# Patient Record
Sex: Female | Born: 1935 | Race: White | Hispanic: No | Marital: Married | Smoking: Former smoker
Health system: Southern US, Community
[De-identification: ages and names within clinical notes are randomized; demographics above are authoritative.]

## PROBLEM LIST (undated history)

## (undated) DIAGNOSIS — J84112 Idiopathic pulmonary fibrosis: Secondary | ICD-10-CM

## (undated) DIAGNOSIS — I4891 Unspecified atrial fibrillation: Secondary | ICD-10-CM

## (undated) DIAGNOSIS — G4733 Obstructive sleep apnea (adult) (pediatric): Secondary | ICD-10-CM

## (undated) DIAGNOSIS — E785 Hyperlipidemia, unspecified: Secondary | ICD-10-CM

## (undated) DIAGNOSIS — F419 Anxiety disorder, unspecified: Secondary | ICD-10-CM

## (undated) DIAGNOSIS — M1990 Unspecified osteoarthritis, unspecified site: Secondary | ICD-10-CM

## (undated) DIAGNOSIS — B0229 Other postherpetic nervous system involvement: Secondary | ICD-10-CM

## (undated) DIAGNOSIS — F329 Major depressive disorder, single episode, unspecified: Secondary | ICD-10-CM

## (undated) DIAGNOSIS — M4800 Spinal stenosis, site unspecified: Secondary | ICD-10-CM

## (undated) DIAGNOSIS — E079 Disorder of thyroid, unspecified: Secondary | ICD-10-CM

## (undated) DIAGNOSIS — F32A Depression, unspecified: Secondary | ICD-10-CM

## (undated) DIAGNOSIS — I509 Heart failure, unspecified: Secondary | ICD-10-CM

## (undated) DIAGNOSIS — IMO0002 Reserved for concepts with insufficient information to code with codable children: Secondary | ICD-10-CM

## (undated) DIAGNOSIS — F0390 Unspecified dementia without behavioral disturbance: Secondary | ICD-10-CM

## (undated) DIAGNOSIS — D649 Anemia, unspecified: Secondary | ICD-10-CM

## (undated) DIAGNOSIS — K589 Irritable bowel syndrome without diarrhea: Secondary | ICD-10-CM

## (undated) DIAGNOSIS — I10 Essential (primary) hypertension: Secondary | ICD-10-CM

## (undated) DIAGNOSIS — F1011 Alcohol abuse, in remission: Secondary | ICD-10-CM

## (undated) DIAGNOSIS — M353 Polymyalgia rheumatica: Secondary | ICD-10-CM

## (undated) DIAGNOSIS — K5730 Diverticulosis of large intestine without perforation or abscess without bleeding: Secondary | ICD-10-CM

## (undated) HISTORY — PX: KNEE ARTHROSCOPY: SUR90

## (undated) HISTORY — DX: Depression, unspecified: F32.A

## (undated) HISTORY — PX: DILATION AND CURETTAGE OF UTERUS: SHX78

## (undated) HISTORY — DX: Anxiety disorder, unspecified: F41.9

## (undated) HISTORY — PX: ROTATOR CUFF REPAIR: SHX139

## (undated) HISTORY — DX: Other postherpetic nervous system involvement: B02.29

## (undated) HISTORY — DX: Spinal stenosis, site unspecified: M48.00

## (undated) HISTORY — DX: Idiopathic pulmonary fibrosis: J84.112

## (undated) HISTORY — PX: CATARACT EXTRACTION: SUR2

## (undated) HISTORY — DX: Unspecified osteoarthritis, unspecified site: M19.90

## (undated) HISTORY — DX: Reserved for concepts with insufficient information to code with codable children: IMO0002

## (undated) HISTORY — DX: Essential (primary) hypertension: I10

## (undated) HISTORY — PX: VERTEBROPLASTY: SHX113

## (undated) HISTORY — DX: Irritable bowel syndrome, unspecified: K58.9

## (undated) HISTORY — DX: Anemia, unspecified: D64.9

## (undated) HISTORY — DX: Polymyalgia rheumatica: M35.3

## (undated) HISTORY — DX: Major depressive disorder, single episode, unspecified: F32.9

## (undated) HISTORY — PX: APPENDECTOMY: SHX54

## (undated) HISTORY — DX: Diverticulosis of large intestine without perforation or abscess without bleeding: K57.30

## (undated) HISTORY — DX: Disorder of thyroid, unspecified: E07.9

## (undated) HISTORY — DX: Hyperlipidemia, unspecified: E78.5

## (undated) HISTORY — DX: Alcohol abuse, in remission: F10.11

## (undated) HISTORY — PX: BREAST BIOPSY: SHX20

## (undated) HISTORY — DX: Obstructive sleep apnea (adult) (pediatric): G47.33

---

## 1997-06-19 ENCOUNTER — Ambulatory Visit (HOSPITAL_COMMUNITY): Admission: RE | Admit: 1997-06-19 | Discharge: 1997-06-19 | Payer: Self-pay | Admitting: Internal Medicine

## 1997-11-14 ENCOUNTER — Other Ambulatory Visit: Admission: RE | Admit: 1997-11-14 | Discharge: 1997-11-14 | Payer: Self-pay | Admitting: Obstetrics & Gynecology

## 1998-03-31 ENCOUNTER — Ambulatory Visit (HOSPITAL_COMMUNITY): Admission: RE | Admit: 1998-03-31 | Discharge: 1998-03-31 | Payer: Self-pay | Admitting: Ophthalmology

## 1998-05-20 ENCOUNTER — Other Ambulatory Visit: Admission: RE | Admit: 1998-05-20 | Discharge: 1998-05-20 | Payer: Self-pay | Admitting: Obstetrics & Gynecology

## 1998-08-04 ENCOUNTER — Ambulatory Visit (HOSPITAL_BASED_OUTPATIENT_CLINIC_OR_DEPARTMENT_OTHER): Admission: RE | Admit: 1998-08-04 | Discharge: 1998-08-04 | Payer: Self-pay | Admitting: Orthopedic Surgery

## 1999-04-29 ENCOUNTER — Encounter: Payer: Self-pay | Admitting: Surgery

## 1999-04-29 ENCOUNTER — Encounter: Admission: RE | Admit: 1999-04-29 | Discharge: 1999-04-29 | Payer: Self-pay | Admitting: Surgery

## 1999-04-30 ENCOUNTER — Ambulatory Visit (HOSPITAL_BASED_OUTPATIENT_CLINIC_OR_DEPARTMENT_OTHER): Admission: RE | Admit: 1999-04-30 | Discharge: 1999-04-30 | Payer: Self-pay | Admitting: Surgery

## 1999-04-30 ENCOUNTER — Encounter (INDEPENDENT_AMBULATORY_CARE_PROVIDER_SITE_OTHER): Payer: Self-pay | Admitting: *Deleted

## 1999-04-30 ENCOUNTER — Encounter: Payer: Self-pay | Admitting: Surgery

## 1999-05-13 ENCOUNTER — Encounter: Admission: RE | Admit: 1999-05-13 | Discharge: 1999-06-07 | Payer: Self-pay | Admitting: Internal Medicine

## 1999-06-10 ENCOUNTER — Other Ambulatory Visit: Admission: RE | Admit: 1999-06-10 | Discharge: 1999-06-10 | Payer: Self-pay | Admitting: Obstetrics & Gynecology

## 1999-09-06 ENCOUNTER — Ambulatory Visit (HOSPITAL_BASED_OUTPATIENT_CLINIC_OR_DEPARTMENT_OTHER): Admission: RE | Admit: 1999-09-06 | Discharge: 1999-09-07 | Payer: Self-pay | Admitting: Orthopedic Surgery

## 2000-01-31 ENCOUNTER — Encounter: Admission: RE | Admit: 2000-01-31 | Discharge: 2000-02-28 | Payer: Self-pay | Admitting: Internal Medicine

## 2000-05-18 ENCOUNTER — Ambulatory Visit (HOSPITAL_COMMUNITY): Admission: RE | Admit: 2000-05-18 | Discharge: 2000-05-18 | Payer: Self-pay | Admitting: Orthopedic Surgery

## 2000-05-18 ENCOUNTER — Encounter: Payer: Self-pay | Admitting: Orthopedic Surgery

## 2000-05-19 ENCOUNTER — Encounter: Payer: Self-pay | Admitting: Orthopedic Surgery

## 2000-05-19 ENCOUNTER — Ambulatory Visit (HOSPITAL_COMMUNITY): Admission: RE | Admit: 2000-05-19 | Discharge: 2000-05-19 | Payer: Self-pay | Admitting: Orthopedic Surgery

## 2000-07-11 ENCOUNTER — Encounter: Admission: RE | Admit: 2000-07-11 | Discharge: 2000-07-19 | Payer: Self-pay | Admitting: Family Medicine

## 2000-10-16 ENCOUNTER — Other Ambulatory Visit: Admission: RE | Admit: 2000-10-16 | Discharge: 2000-10-16 | Payer: Self-pay | Admitting: Obstetrics & Gynecology

## 2001-04-04 ENCOUNTER — Encounter: Payer: Self-pay | Admitting: Orthopedic Surgery

## 2001-04-04 ENCOUNTER — Encounter: Admission: RE | Admit: 2001-04-04 | Discharge: 2001-04-04 | Payer: Self-pay | Admitting: Orthopedic Surgery

## 2001-04-18 ENCOUNTER — Encounter: Payer: Self-pay | Admitting: Orthopedic Surgery

## 2001-04-18 ENCOUNTER — Encounter: Admission: RE | Admit: 2001-04-18 | Discharge: 2001-04-18 | Payer: Self-pay | Admitting: Orthopedic Surgery

## 2001-05-02 ENCOUNTER — Encounter: Admission: RE | Admit: 2001-05-02 | Discharge: 2001-05-02 | Payer: Self-pay | Admitting: Orthopedic Surgery

## 2001-05-02 ENCOUNTER — Encounter: Payer: Self-pay | Admitting: Orthopedic Surgery

## 2001-10-20 ENCOUNTER — Emergency Department (HOSPITAL_COMMUNITY): Admission: EM | Admit: 2001-10-20 | Discharge: 2001-10-21 | Payer: Self-pay | Admitting: Emergency Medicine

## 2001-10-21 ENCOUNTER — Encounter: Payer: Self-pay | Admitting: Emergency Medicine

## 2001-12-05 ENCOUNTER — Other Ambulatory Visit: Admission: RE | Admit: 2001-12-05 | Discharge: 2001-12-05 | Payer: Self-pay | Admitting: Obstetrics & Gynecology

## 2002-02-23 ENCOUNTER — Encounter: Payer: Self-pay | Admitting: *Deleted

## 2002-02-23 ENCOUNTER — Emergency Department (HOSPITAL_COMMUNITY): Admission: EM | Admit: 2002-02-23 | Discharge: 2002-02-23 | Payer: Self-pay | Admitting: *Deleted

## 2002-06-07 ENCOUNTER — Encounter: Payer: Self-pay | Admitting: Internal Medicine

## 2002-06-07 LAB — HM COLONOSCOPY

## 2002-06-11 ENCOUNTER — Encounter: Payer: Self-pay | Admitting: Family Medicine

## 2002-06-11 ENCOUNTER — Encounter: Admission: RE | Admit: 2002-06-11 | Discharge: 2002-06-11 | Payer: Self-pay | Admitting: Family Medicine

## 2002-06-11 ENCOUNTER — Encounter: Payer: Self-pay | Admitting: Radiology

## 2002-06-25 ENCOUNTER — Encounter: Admission: RE | Admit: 2002-06-25 | Discharge: 2002-06-25 | Payer: Self-pay | Admitting: Family Medicine

## 2002-06-25 ENCOUNTER — Encounter: Payer: Self-pay | Admitting: Family Medicine

## 2002-08-12 ENCOUNTER — Encounter: Admission: RE | Admit: 2002-08-12 | Discharge: 2002-08-12 | Payer: Self-pay | Admitting: Family Medicine

## 2002-08-12 ENCOUNTER — Encounter: Payer: Self-pay | Admitting: Family Medicine

## 2002-09-09 ENCOUNTER — Encounter (INDEPENDENT_AMBULATORY_CARE_PROVIDER_SITE_OTHER): Payer: Self-pay | Admitting: *Deleted

## 2002-09-09 ENCOUNTER — Inpatient Hospital Stay (HOSPITAL_COMMUNITY): Admission: RE | Admit: 2002-09-09 | Discharge: 2002-09-10 | Payer: Self-pay | Admitting: Family Medicine

## 2002-09-09 ENCOUNTER — Encounter: Payer: Self-pay | Admitting: Family Medicine

## 2003-01-07 ENCOUNTER — Other Ambulatory Visit: Admission: RE | Admit: 2003-01-07 | Discharge: 2003-01-07 | Payer: Self-pay | Admitting: Obstetrics & Gynecology

## 2003-12-03 ENCOUNTER — Ambulatory Visit: Payer: Self-pay | Admitting: Internal Medicine

## 2003-12-10 ENCOUNTER — Ambulatory Visit: Payer: Self-pay | Admitting: Internal Medicine

## 2004-02-04 ENCOUNTER — Ambulatory Visit: Payer: Self-pay | Admitting: Internal Medicine

## 2004-02-11 ENCOUNTER — Ambulatory Visit: Payer: Self-pay | Admitting: Internal Medicine

## 2004-02-25 ENCOUNTER — Ambulatory Visit: Payer: Self-pay | Admitting: Internal Medicine

## 2004-03-05 ENCOUNTER — Ambulatory Visit: Payer: Self-pay | Admitting: Internal Medicine

## 2004-04-14 ENCOUNTER — Ambulatory Visit: Payer: Self-pay | Admitting: Internal Medicine

## 2004-04-21 ENCOUNTER — Other Ambulatory Visit: Admission: RE | Admit: 2004-04-21 | Discharge: 2004-04-21 | Payer: Self-pay | Admitting: Obstetrics & Gynecology

## 2004-04-27 ENCOUNTER — Ambulatory Visit: Payer: Self-pay | Admitting: Internal Medicine

## 2004-04-29 ENCOUNTER — Encounter: Admission: RE | Admit: 2004-04-29 | Discharge: 2004-04-29 | Payer: Self-pay | Admitting: Internal Medicine

## 2004-05-05 ENCOUNTER — Ambulatory Visit: Payer: Self-pay | Admitting: Internal Medicine

## 2004-06-16 ENCOUNTER — Ambulatory Visit: Payer: Self-pay | Admitting: Internal Medicine

## 2004-06-23 ENCOUNTER — Ambulatory Visit: Payer: Self-pay | Admitting: Internal Medicine

## 2004-07-21 ENCOUNTER — Ambulatory Visit: Payer: Self-pay | Admitting: Internal Medicine

## 2004-08-18 ENCOUNTER — Ambulatory Visit: Payer: Self-pay | Admitting: Internal Medicine

## 2004-08-25 ENCOUNTER — Ambulatory Visit: Payer: Self-pay | Admitting: Internal Medicine

## 2004-09-07 ENCOUNTER — Ambulatory Visit: Payer: Self-pay | Admitting: Internal Medicine

## 2004-10-06 ENCOUNTER — Ambulatory Visit (HOSPITAL_BASED_OUTPATIENT_CLINIC_OR_DEPARTMENT_OTHER): Admission: RE | Admit: 2004-10-06 | Discharge: 2004-10-06 | Payer: Self-pay | Admitting: Internal Medicine

## 2004-10-14 ENCOUNTER — Ambulatory Visit: Payer: Self-pay | Admitting: Pulmonary Disease

## 2004-10-20 ENCOUNTER — Ambulatory Visit: Payer: Self-pay | Admitting: Internal Medicine

## 2004-11-24 ENCOUNTER — Ambulatory Visit: Payer: Self-pay | Admitting: Internal Medicine

## 2004-11-30 ENCOUNTER — Ambulatory Visit: Payer: Self-pay | Admitting: Pulmonary Disease

## 2004-12-20 ENCOUNTER — Ambulatory Visit: Payer: Self-pay | Admitting: Internal Medicine

## 2004-12-28 ENCOUNTER — Ambulatory Visit: Payer: Self-pay | Admitting: Pulmonary Disease

## 2005-01-05 ENCOUNTER — Ambulatory Visit: Payer: Self-pay | Admitting: Internal Medicine

## 2005-01-14 ENCOUNTER — Encounter: Admission: RE | Admit: 2005-01-14 | Discharge: 2005-01-14 | Payer: Self-pay | Admitting: Orthopedic Surgery

## 2005-01-19 ENCOUNTER — Ambulatory Visit (HOSPITAL_COMMUNITY): Admission: RE | Admit: 2005-01-19 | Discharge: 2005-01-19 | Payer: Self-pay | Admitting: Orthopedic Surgery

## 2005-01-19 ENCOUNTER — Ambulatory Visit (HOSPITAL_BASED_OUTPATIENT_CLINIC_OR_DEPARTMENT_OTHER): Admission: RE | Admit: 2005-01-19 | Discharge: 2005-01-19 | Payer: Self-pay | Admitting: Orthopedic Surgery

## 2005-01-19 ENCOUNTER — Encounter (INDEPENDENT_AMBULATORY_CARE_PROVIDER_SITE_OTHER): Payer: Self-pay | Admitting: *Deleted

## 2005-02-28 ENCOUNTER — Ambulatory Visit: Payer: Self-pay | Admitting: Internal Medicine

## 2005-04-01 ENCOUNTER — Ambulatory Visit: Payer: Self-pay | Admitting: Internal Medicine

## 2005-05-12 ENCOUNTER — Ambulatory Visit: Payer: Self-pay | Admitting: Internal Medicine

## 2005-06-06 ENCOUNTER — Ambulatory Visit: Payer: Self-pay | Admitting: Internal Medicine

## 2005-06-23 ENCOUNTER — Ambulatory Visit: Payer: Self-pay | Admitting: Internal Medicine

## 2005-07-18 ENCOUNTER — Ambulatory Visit: Payer: Self-pay | Admitting: Internal Medicine

## 2005-07-19 ENCOUNTER — Ambulatory Visit: Payer: Self-pay | Admitting: Internal Medicine

## 2005-07-21 ENCOUNTER — Ambulatory Visit: Payer: Self-pay | Admitting: Internal Medicine

## 2005-08-22 ENCOUNTER — Ambulatory Visit: Payer: Self-pay | Admitting: Internal Medicine

## 2005-09-01 ENCOUNTER — Ambulatory Visit: Payer: Self-pay | Admitting: Internal Medicine

## 2005-09-29 ENCOUNTER — Ambulatory Visit: Payer: Self-pay | Admitting: Internal Medicine

## 2005-11-03 ENCOUNTER — Ambulatory Visit: Payer: Self-pay | Admitting: Internal Medicine

## 2005-11-25 ENCOUNTER — Ambulatory Visit: Payer: Self-pay | Admitting: Internal Medicine

## 2005-11-25 ENCOUNTER — Encounter: Admission: RE | Admit: 2005-11-25 | Discharge: 2005-11-25 | Payer: Self-pay | Admitting: Internal Medicine

## 2005-12-01 ENCOUNTER — Encounter: Admission: RE | Admit: 2005-12-01 | Discharge: 2005-12-01 | Payer: Self-pay | Admitting: Internal Medicine

## 2005-12-05 ENCOUNTER — Ambulatory Visit: Payer: Self-pay | Admitting: Internal Medicine

## 2005-12-07 ENCOUNTER — Encounter: Admission: RE | Admit: 2005-12-07 | Discharge: 2005-12-26 | Payer: Self-pay | Admitting: Internal Medicine

## 2006-01-02 ENCOUNTER — Ambulatory Visit: Payer: Self-pay | Admitting: Internal Medicine

## 2006-01-11 ENCOUNTER — Encounter: Admission: RE | Admit: 2006-01-11 | Discharge: 2006-01-11 | Payer: Self-pay | Admitting: Internal Medicine

## 2006-01-25 ENCOUNTER — Encounter: Admission: RE | Admit: 2006-01-25 | Discharge: 2006-01-25 | Payer: Self-pay | Admitting: Internal Medicine

## 2006-02-08 ENCOUNTER — Ambulatory Visit: Payer: Self-pay | Admitting: Internal Medicine

## 2006-02-15 ENCOUNTER — Encounter: Admission: RE | Admit: 2006-02-15 | Discharge: 2006-02-15 | Payer: Self-pay | Admitting: Internal Medicine

## 2006-02-22 ENCOUNTER — Ambulatory Visit: Payer: Self-pay | Admitting: Internal Medicine

## 2006-02-23 ENCOUNTER — Ambulatory Visit: Payer: Self-pay | Admitting: Internal Medicine

## 2006-04-04 ENCOUNTER — Ambulatory Visit: Payer: Self-pay | Admitting: Internal Medicine

## 2006-04-26 ENCOUNTER — Ambulatory Visit: Payer: Self-pay | Admitting: Internal Medicine

## 2006-04-26 LAB — CONVERTED CEMR LAB
Anti Nuclear Antibody(ANA): NEGATIVE
Basophils Absolute: 0 10*3/uL (ref 0.0–0.1)
Basophils Relative: 0.6 % (ref 0.0–1.0)
Eosinophils Absolute: 0.1 10*3/uL (ref 0.0–0.6)
Eosinophils Relative: 1.3 % (ref 0.0–5.0)
HCT: 31.5 % — ABNORMAL LOW (ref 36.0–46.0)
Hemoglobin: 10.9 g/dL — ABNORMAL LOW (ref 12.0–15.0)
Lymphocytes Relative: 48.7 % — ABNORMAL HIGH (ref 12.0–46.0)
MCHC: 34.5 g/dL (ref 30.0–36.0)
MCV: 97.3 fL (ref 78.0–100.0)
Monocytes Absolute: 0.5 10*3/uL (ref 0.2–0.7)
Monocytes Relative: 10.5 % (ref 3.0–11.0)
Neutro Abs: 1.9 10*3/uL (ref 1.4–7.7)
Neutrophils Relative %: 38.9 % — ABNORMAL LOW (ref 43.0–77.0)
Platelets: 176 10*3/uL (ref 150–400)
RBC: 3.24 M/uL — ABNORMAL LOW (ref 3.87–5.11)
RDW: 12.9 % (ref 11.5–14.6)
Rhuematoid fact SerPl-aCnc: <20 intl units/mL — ABNORMAL LOW (ref 0.0–20.0)
WBC: 4.8 10*3/uL (ref 4.5–10.5)

## 2006-04-30 DIAGNOSIS — F329 Major depressive disorder, single episode, unspecified: Secondary | ICD-10-CM

## 2006-04-30 DIAGNOSIS — F32A Depression, unspecified: Secondary | ICD-10-CM | POA: Insufficient documentation

## 2006-04-30 DIAGNOSIS — M810 Age-related osteoporosis without current pathological fracture: Secondary | ICD-10-CM | POA: Insufficient documentation

## 2006-04-30 DIAGNOSIS — I10 Essential (primary) hypertension: Secondary | ICD-10-CM

## 2006-05-01 ENCOUNTER — Ambulatory Visit: Payer: Self-pay | Admitting: Internal Medicine

## 2006-05-15 ENCOUNTER — Ambulatory Visit: Payer: Self-pay | Admitting: Internal Medicine

## 2006-05-15 LAB — CONVERTED CEMR LAB
Basophils Absolute: 0 10*3/uL (ref 0.0–0.1)
Basophils Relative: 0.6 % (ref 0.0–1.0)
Eosinophils Absolute: 0 10*3/uL (ref 0.0–0.6)
Eosinophils Relative: 0.2 % (ref 0.0–5.0)
Ferritin: 44.2 ng/mL (ref 10.0–291.0)
Folate: 9.8 ng/mL
HCT: 40.1 % (ref 36.0–46.0)
Hemoglobin: 13.6 g/dL (ref 12.0–15.0)
Lymphocytes Relative: 24.1 % (ref 12.0–46.0)
MCHC: 33.9 g/dL (ref 30.0–36.0)
MCV: 99 fL (ref 78.0–100.0)
Monocytes Absolute: 0.2 10*3/uL (ref 0.2–0.7)
Monocytes Relative: 2.8 % — ABNORMAL LOW (ref 3.0–11.0)
Neutro Abs: 5 10*3/uL (ref 1.4–7.7)
Neutrophils Relative %: 72.3 % (ref 43.0–77.0)
Platelets: 223 10*3/uL (ref 150–400)
RBC: 4.05 M/uL (ref 3.87–5.11)
RDW: 13.6 % (ref 11.5–14.6)
Vitamin B-12: 354 pg/mL (ref 211–911)
WBC: 6.9 10*3/uL (ref 4.5–10.5)

## 2006-06-06 ENCOUNTER — Ambulatory Visit: Payer: Self-pay | Admitting: Internal Medicine

## 2006-06-06 DIAGNOSIS — K5730 Diverticulosis of large intestine without perforation or abscess without bleeding: Secondary | ICD-10-CM | POA: Insufficient documentation

## 2006-06-06 LAB — CONVERTED CEMR LAB
ALT: 31 U/L
AST: 26 U/L
Albumin: 3.6 g/dL
Alkaline Phosphatase: 40 U/L
Amylase: 63 U/L
BUN: 14 mg/dL
Basophils Absolute: 0.1 K/uL
Basophils Relative: 1 %
Bilirubin, Direct: 0.1 mg/dL
CO2: 32 meq/L
Calcium: 9.4 mg/dL
Chloride: 101 meq/L
Creatinine, Ser: 0.8 mg/dL
Eosinophils Absolute: 0 K/uL
Eosinophils Relative: 0.2 %
GFR calc Af Amer: 90 mL/min
GFR calc non Af Amer: 75 mL/min
Glucose, Bld: 119 mg/dL — ABNORMAL HIGH
HCT: 34.2 % — ABNORMAL LOW
Hemoglobin: 11.7 g/dL — ABNORMAL LOW
Lymphocytes Relative: 26 %
MCHC: 34.2 g/dL
MCV: 98.3 fL
Monocytes Absolute: 0.3 K/uL
Monocytes Relative: 5.7 %
Neutro Abs: 3.4 K/uL
Neutrophils Relative %: 67.1 %
Platelets: 198 K/uL
Potassium: 4.2 meq/L
RBC: 3.48 M/uL — ABNORMAL LOW
RDW: 13.5 %
Sodium: 139 meq/L
Total Bilirubin: 0.5 mg/dL
Total Protein: 7.2 g/dL
WBC: 5.1 10*3/microliter

## 2006-06-13 ENCOUNTER — Ambulatory Visit: Payer: Self-pay | Admitting: Internal Medicine

## 2006-06-23 ENCOUNTER — Ambulatory Visit: Payer: Self-pay | Admitting: Internal Medicine

## 2006-07-12 ENCOUNTER — Ambulatory Visit: Payer: Self-pay | Admitting: Internal Medicine

## 2006-07-25 ENCOUNTER — Ambulatory Visit: Payer: Self-pay | Admitting: Internal Medicine

## 2006-07-25 LAB — CONVERTED CEMR LAB
ALT: 20 units/L (ref 0–35)
AST: 27 units/L (ref 0–37)
Albumin: 3.1 g/dL — ABNORMAL LOW (ref 3.5–5.2)
Alkaline Phosphatase: 55 units/L (ref 39–117)
BUN: 8 mg/dL (ref 6–23)
Bilirubin, Direct: 0.1 mg/dL (ref 0.0–0.3)
CK-MB: 3.8 ng/mL (ref 0.3–4.0)
CO2: 33 meq/L — ABNORMAL HIGH (ref 19–32)
Calcium: 9 mg/dL (ref 8.4–10.5)
Chloride: 106 meq/L (ref 96–112)
Creatinine, Ser: 0.7 mg/dL (ref 0.4–1.2)
GFR calc Af Amer: 105 mL/min
GFR calc non Af Amer: 87 mL/min
Glucose, Bld: 94 mg/dL (ref 70–99)
Potassium: 3.5 meq/L (ref 3.5–5.1)
Sodium: 139 meq/L (ref 135–145)
Total Bilirubin: 0.7 mg/dL (ref 0.3–1.2)
Total CK: 91 units/L (ref 7–177)
Total Protein: 7.9 g/dL (ref 6.0–8.3)

## 2006-08-01 ENCOUNTER — Ambulatory Visit: Payer: Self-pay | Admitting: Internal Medicine

## 2006-08-24 ENCOUNTER — Encounter: Admission: RE | Admit: 2006-08-24 | Discharge: 2006-08-24 | Payer: Self-pay | Admitting: Neurosurgery

## 2006-08-25 ENCOUNTER — Telehealth (INDEPENDENT_AMBULATORY_CARE_PROVIDER_SITE_OTHER): Payer: Self-pay | Admitting: *Deleted

## 2006-09-07 ENCOUNTER — Encounter: Payer: Self-pay | Admitting: Internal Medicine

## 2006-09-12 ENCOUNTER — Ambulatory Visit: Payer: Self-pay | Admitting: Internal Medicine

## 2006-09-12 LAB — CONVERTED CEMR LAB: TSH: 3.06 microintl units/mL (ref 0.35–5.50)

## 2006-09-26 ENCOUNTER — Telehealth: Payer: Self-pay | Admitting: Internal Medicine

## 2006-10-17 ENCOUNTER — Ambulatory Visit: Payer: Self-pay | Admitting: Internal Medicine

## 2006-10-19 LAB — CONVERTED CEMR LAB
ALT: 25 units/L (ref 0–35)
AST: 29 units/L (ref 0–37)
Albumin: 3.7 g/dL (ref 3.5–5.2)
Alkaline Phosphatase: 46 units/L (ref 39–117)
BUN: 7 mg/dL (ref 6–23)
Bilirubin, Direct: 0.1 mg/dL (ref 0.0–0.3)
CO2: 31 meq/L (ref 19–32)
Calcium: 9.7 mg/dL (ref 8.4–10.5)
Chloride: 101 meq/L (ref 96–112)
Cholesterol: 252 mg/dL (ref 0–200)
Creatinine, Ser: 0.8 mg/dL (ref 0.4–1.2)
Direct LDL: 168.2 mg/dL
GFR calc Af Amer: 90 mL/min
GFR calc non Af Amer: 75 mL/min
Glucose, Bld: 99 mg/dL (ref 70–99)
HDL: 75.6 mg/dL (ref >39.0)
Potassium: 5 meq/L (ref 3.5–5.1)
Sodium: 138 meq/L (ref 135–145)
Total Bilirubin: 0.6 mg/dL (ref 0.3–1.2)
Total CHOL/HDL Ratio: 3.3
Total Protein: 7.7 g/dL (ref 6.0–8.3)
Triglycerides: 56 mg/dL (ref 0–149)
VLDL: 11 mg/dL (ref 0–40)

## 2006-10-31 ENCOUNTER — Telehealth: Payer: Self-pay | Admitting: Internal Medicine

## 2006-11-01 ENCOUNTER — Encounter: Payer: Self-pay | Admitting: Internal Medicine

## 2006-11-09 ENCOUNTER — Ambulatory Visit: Payer: Self-pay | Admitting: Internal Medicine

## 2006-11-16 ENCOUNTER — Ambulatory Visit: Payer: Self-pay | Admitting: Pulmonary Disease

## 2006-11-16 ENCOUNTER — Ambulatory Visit: Payer: Self-pay | Admitting: Internal Medicine

## 2006-11-16 DIAGNOSIS — G894 Chronic pain syndrome: Secondary | ICD-10-CM

## 2006-12-08 ENCOUNTER — Ambulatory Visit: Payer: Self-pay | Admitting: Internal Medicine

## 2006-12-19 ENCOUNTER — Ambulatory Visit: Payer: Self-pay | Admitting: Internal Medicine

## 2007-01-23 ENCOUNTER — Telehealth: Payer: Self-pay | Admitting: Internal Medicine

## 2007-01-30 ENCOUNTER — Ambulatory Visit: Payer: Self-pay | Admitting: Internal Medicine

## 2007-02-16 ENCOUNTER — Telehealth: Payer: Self-pay | Admitting: Internal Medicine

## 2007-03-01 ENCOUNTER — Ambulatory Visit: Payer: Self-pay | Admitting: Internal Medicine

## 2007-03-19 ENCOUNTER — Telehealth: Payer: Self-pay | Admitting: Internal Medicine

## 2007-03-29 ENCOUNTER — Ambulatory Visit: Payer: Self-pay | Admitting: Internal Medicine

## 2007-05-10 ENCOUNTER — Ambulatory Visit: Payer: Self-pay | Admitting: Internal Medicine

## 2007-05-10 LAB — CONVERTED CEMR LAB
BUN: 8 mg/dL (ref 6–23)
GFR calc Af Amer: 125 mL/min
GFR calc non Af Amer: 104 mL/min
Potassium: 3.6 meq/L (ref 3.5–5.1)
Sodium: 136 meq/L (ref 135–145)

## 2007-06-01 ENCOUNTER — Ambulatory Visit: Payer: Self-pay | Admitting: Internal Medicine

## 2007-06-07 ENCOUNTER — Ambulatory Visit: Payer: Self-pay | Admitting: Internal Medicine

## 2007-06-13 ENCOUNTER — Ambulatory Visit: Payer: Self-pay | Admitting: Internal Medicine

## 2007-06-25 ENCOUNTER — Telehealth: Payer: Self-pay | Admitting: Internal Medicine

## 2007-07-13 ENCOUNTER — Ambulatory Visit: Payer: Self-pay | Admitting: Internal Medicine

## 2007-08-03 ENCOUNTER — Telehealth: Payer: Self-pay | Admitting: Internal Medicine

## 2007-08-06 ENCOUNTER — Ambulatory Visit: Payer: Self-pay | Admitting: Internal Medicine

## 2007-08-07 LAB — CONVERTED CEMR LAB
ALT: 24 units/L (ref 0–35)
AST: 26 units/L (ref 0–37)
Alkaline Phosphatase: 51 units/L (ref 39–117)
Basophils Absolute: 0 10*3/uL (ref 0.0–0.1)
Basophils Relative: 0.8 % (ref 0.0–1.0)
Bilirubin, Direct: 0.1 mg/dL (ref 0.0–0.3)
CO2: 33 meq/L — ABNORMAL HIGH (ref 19–32)
Chloride: 101 meq/L (ref 96–112)
Creatinine, Ser: 0.7 mg/dL (ref 0.4–1.2)
Lymphocytes Relative: 56.9 % — ABNORMAL HIGH (ref 12.0–46.0)
MCHC: 33.8 g/dL (ref 30.0–36.0)
Neutrophils Relative %: 28.3 % — ABNORMAL LOW (ref 43.0–77.0)
Potassium: 4.3 meq/L (ref 3.5–5.1)
RBC: 3.79 M/uL — ABNORMAL LOW (ref 3.87–5.11)
Sodium: 139 meq/L (ref 135–145)
Total Bilirubin: 0.6 mg/dL (ref 0.3–1.2)

## 2007-08-10 ENCOUNTER — Telehealth: Payer: Self-pay | Admitting: Internal Medicine

## 2007-09-06 ENCOUNTER — Encounter: Payer: Self-pay | Admitting: Internal Medicine

## 2007-09-07 ENCOUNTER — Ambulatory Visit: Payer: Self-pay

## 2007-09-14 ENCOUNTER — Encounter: Payer: Self-pay | Admitting: Internal Medicine

## 2007-09-19 ENCOUNTER — Ambulatory Visit: Payer: Self-pay | Admitting: Internal Medicine

## 2007-10-02 ENCOUNTER — Telehealth: Payer: Self-pay | Admitting: Internal Medicine

## 2007-10-08 DIAGNOSIS — G4733 Obstructive sleep apnea (adult) (pediatric): Secondary | ICD-10-CM

## 2007-10-10 ENCOUNTER — Ambulatory Visit: Payer: Self-pay | Admitting: Internal Medicine

## 2007-10-29 ENCOUNTER — Encounter: Payer: Self-pay | Admitting: Internal Medicine

## 2007-10-31 ENCOUNTER — Ambulatory Visit: Payer: Self-pay | Admitting: Internal Medicine

## 2007-11-09 ENCOUNTER — Encounter: Admission: RE | Admit: 2007-11-09 | Discharge: 2007-11-09 | Payer: Self-pay | Admitting: Orthopedic Surgery

## 2007-11-12 ENCOUNTER — Ambulatory Visit (HOSPITAL_BASED_OUTPATIENT_CLINIC_OR_DEPARTMENT_OTHER): Admission: RE | Admit: 2007-11-12 | Discharge: 2007-11-12 | Payer: Self-pay | Admitting: Orthopedic Surgery

## 2007-11-20 ENCOUNTER — Telehealth: Payer: Self-pay | Admitting: Internal Medicine

## 2007-11-26 ENCOUNTER — Telehealth: Payer: Self-pay | Admitting: Internal Medicine

## 2007-11-28 ENCOUNTER — Ambulatory Visit: Payer: Self-pay | Admitting: Internal Medicine

## 2007-12-11 ENCOUNTER — Telehealth: Payer: Self-pay | Admitting: Internal Medicine

## 2007-12-11 ENCOUNTER — Encounter: Payer: Self-pay | Admitting: Internal Medicine

## 2007-12-18 ENCOUNTER — Telehealth: Payer: Self-pay | Admitting: Internal Medicine

## 2007-12-26 ENCOUNTER — Encounter: Payer: Self-pay | Admitting: Internal Medicine

## 2007-12-28 ENCOUNTER — Encounter: Payer: Self-pay | Admitting: Internal Medicine

## 2008-01-03 ENCOUNTER — Encounter: Payer: Self-pay | Admitting: Internal Medicine

## 2008-01-04 ENCOUNTER — Ambulatory Visit: Payer: Self-pay | Admitting: Internal Medicine

## 2008-01-07 ENCOUNTER — Encounter: Payer: Self-pay | Admitting: Internal Medicine

## 2008-01-08 LAB — CONVERTED CEMR LAB
Albumin: 3.9 g/dL (ref 3.5–5.2)
Alkaline Phosphatase: 58 units/L (ref 39–117)
BUN: 16 mg/dL (ref 6–23)
Bilirubin, Direct: 0.1 mg/dL (ref 0.0–0.3)
Calcium: 9.7 mg/dL (ref 8.4–10.5)
Eosinophils Absolute: 0.3 10*3/uL (ref 0.0–0.7)
GFR calc Af Amer: 105 mL/min
GFR calc non Af Amer: 87 mL/min
HCT: 39.4 % (ref 36.0–46.0)
MCHC: 33.9 g/dL (ref 30.0–36.0)
MCV: 96.9 fL (ref 78.0–100.0)
Monocytes Absolute: 0.8 10*3/uL (ref 0.1–1.0)
Neutro Abs: 4.6 10*3/uL (ref 1.4–7.7)
Platelets: 201 10*3/uL (ref 150–400)
Potassium: 5.1 meq/L (ref 3.5–5.1)
RDW: 14.4 % (ref 11.5–14.6)
Sodium: 137 meq/L (ref 135–145)

## 2008-01-22 ENCOUNTER — Telehealth: Payer: Self-pay | Admitting: Internal Medicine

## 2008-01-23 ENCOUNTER — Encounter: Payer: Self-pay | Admitting: Internal Medicine

## 2008-01-23 ENCOUNTER — Encounter: Admission: RE | Admit: 2008-01-23 | Discharge: 2008-01-23 | Payer: Self-pay | Admitting: Orthopedic Surgery

## 2008-01-24 ENCOUNTER — Ambulatory Visit: Payer: Self-pay | Admitting: Internal Medicine

## 2008-01-29 ENCOUNTER — Ambulatory Visit: Payer: Self-pay | Admitting: Internal Medicine

## 2008-01-31 ENCOUNTER — Encounter: Payer: Self-pay | Admitting: Internal Medicine

## 2008-02-06 ENCOUNTER — Encounter: Admission: RE | Admit: 2008-02-06 | Discharge: 2008-02-06 | Payer: Self-pay | Admitting: Neurosurgery

## 2008-02-22 ENCOUNTER — Encounter: Admission: RE | Admit: 2008-02-22 | Discharge: 2008-02-22 | Payer: Self-pay | Admitting: Neurosurgery

## 2008-02-25 ENCOUNTER — Telehealth: Payer: Self-pay | Admitting: Internal Medicine

## 2008-02-26 ENCOUNTER — Telehealth: Payer: Self-pay | Admitting: Internal Medicine

## 2008-03-07 ENCOUNTER — Ambulatory Visit: Payer: Self-pay | Admitting: Internal Medicine

## 2008-03-11 ENCOUNTER — Telehealth: Payer: Self-pay | Admitting: Internal Medicine

## 2008-03-19 ENCOUNTER — Encounter: Payer: Self-pay | Admitting: Internal Medicine

## 2008-03-19 ENCOUNTER — Ambulatory Visit: Payer: Self-pay | Admitting: Internal Medicine

## 2008-04-04 ENCOUNTER — Ambulatory Visit: Payer: Self-pay | Admitting: Internal Medicine

## 2008-05-20 ENCOUNTER — Ambulatory Visit: Payer: Self-pay | Admitting: Internal Medicine

## 2008-06-11 ENCOUNTER — Ambulatory Visit: Payer: Self-pay | Admitting: Internal Medicine

## 2008-06-12 ENCOUNTER — Telehealth (INDEPENDENT_AMBULATORY_CARE_PROVIDER_SITE_OTHER): Payer: Self-pay | Admitting: *Deleted

## 2008-06-13 ENCOUNTER — Encounter: Payer: Self-pay | Admitting: Internal Medicine

## 2008-06-13 ENCOUNTER — Telehealth: Payer: Self-pay | Admitting: Internal Medicine

## 2008-07-08 ENCOUNTER — Encounter: Admission: RE | Admit: 2008-07-08 | Discharge: 2008-07-08 | Payer: Self-pay | Admitting: Orthopedic Surgery

## 2008-07-09 ENCOUNTER — Ambulatory Visit: Payer: Self-pay | Admitting: Internal Medicine

## 2008-08-20 ENCOUNTER — Ambulatory Visit: Payer: Self-pay | Admitting: Internal Medicine

## 2008-09-12 ENCOUNTER — Telehealth: Payer: Self-pay | Admitting: Internal Medicine

## 2008-09-15 ENCOUNTER — Telehealth: Payer: Self-pay | Admitting: Internal Medicine

## 2008-09-18 ENCOUNTER — Telehealth: Payer: Self-pay | Admitting: Internal Medicine

## 2008-09-18 ENCOUNTER — Encounter: Admission: RE | Admit: 2008-09-18 | Discharge: 2008-09-18 | Payer: Self-pay | Admitting: Orthopedic Surgery

## 2008-09-25 ENCOUNTER — Ambulatory Visit: Payer: Self-pay | Admitting: Internal Medicine

## 2008-10-01 ENCOUNTER — Telehealth: Payer: Self-pay | Admitting: Internal Medicine

## 2008-11-03 ENCOUNTER — Telehealth: Payer: Self-pay | Admitting: Internal Medicine

## 2008-11-05 ENCOUNTER — Ambulatory Visit: Payer: Self-pay | Admitting: Internal Medicine

## 2008-11-14 ENCOUNTER — Telehealth: Payer: Self-pay | Admitting: Internal Medicine

## 2008-11-17 ENCOUNTER — Telehealth: Payer: Self-pay | Admitting: Internal Medicine

## 2008-12-09 ENCOUNTER — Telehealth: Payer: Self-pay | Admitting: Internal Medicine

## 2008-12-23 ENCOUNTER — Ambulatory Visit: Payer: Self-pay | Admitting: Internal Medicine

## 2008-12-26 ENCOUNTER — Encounter (INDEPENDENT_AMBULATORY_CARE_PROVIDER_SITE_OTHER): Payer: Self-pay | Admitting: *Deleted

## 2008-12-26 LAB — CONVERTED CEMR LAB: Vit D, 25-Hydroxy: 12 ng/mL — ABNORMAL LOW (ref 30–89)

## 2008-12-29 ENCOUNTER — Telehealth: Payer: Self-pay | Admitting: Internal Medicine

## 2008-12-29 LAB — CONVERTED CEMR LAB
Albumin: 3.7 g/dL (ref 3.5–5.2)
CO2: 31 meq/L (ref 19–32)
Calcium: 9.9 mg/dL (ref 8.4–10.5)
Chloride: 103 meq/L (ref 96–112)
Cholesterol: 234 mg/dL — ABNORMAL HIGH (ref 0–200)
Creatinine, Ser: 0.6 mg/dL (ref 0.4–1.2)
Direct LDL: 172.2 mg/dL
Sodium: 139 meq/L (ref 135–145)
Total CHOL/HDL Ratio: 5
Total Protein: 7.5 g/dL (ref 6.0–8.3)
Triglycerides: 100 mg/dL (ref 0.0–149.0)
VLDL: 20 mg/dL (ref 0.0–40.0)

## 2008-12-31 ENCOUNTER — Telehealth: Payer: Self-pay | Admitting: Internal Medicine

## 2009-01-01 ENCOUNTER — Telehealth: Payer: Self-pay | Admitting: Internal Medicine

## 2009-01-02 ENCOUNTER — Telehealth: Payer: Self-pay | Admitting: Internal Medicine

## 2009-01-20 ENCOUNTER — Telehealth: Payer: Self-pay | Admitting: Internal Medicine

## 2009-01-20 ENCOUNTER — Encounter: Payer: Self-pay | Admitting: Internal Medicine

## 2009-01-22 ENCOUNTER — Encounter: Payer: Self-pay | Admitting: Internal Medicine

## 2009-01-30 ENCOUNTER — Ambulatory Visit: Payer: Self-pay | Admitting: Internal Medicine

## 2009-02-03 LAB — CONVERTED CEMR LAB
CO2: 29 meq/L (ref 19–32)
Calcium: 9.6 mg/dL (ref 8.4–10.5)
Chloride: 102 meq/L (ref 96–112)
Creatinine, Ser: 0.6 mg/dL (ref 0.4–1.2)
Sodium: 138 meq/L (ref 135–145)

## 2009-02-04 ENCOUNTER — Telehealth: Payer: Self-pay | Admitting: Internal Medicine

## 2009-02-05 ENCOUNTER — Telehealth: Payer: Self-pay | Admitting: Internal Medicine

## 2009-02-18 ENCOUNTER — Ambulatory Visit: Payer: Self-pay | Admitting: Internal Medicine

## 2009-02-20 ENCOUNTER — Encounter: Payer: Self-pay | Admitting: Internal Medicine

## 2009-03-20 ENCOUNTER — Encounter: Payer: Self-pay | Admitting: Gastroenterology

## 2009-03-23 ENCOUNTER — Telehealth: Payer: Self-pay | Admitting: Internal Medicine

## 2009-03-23 ENCOUNTER — Encounter: Payer: Self-pay | Admitting: Internal Medicine

## 2009-04-21 ENCOUNTER — Ambulatory Visit: Payer: Self-pay | Admitting: Internal Medicine

## 2009-04-22 LAB — CONVERTED CEMR LAB
ALT: 20 units/L (ref 0–35)
AST: 26 units/L (ref 0–37)
Albumin: 3.7 g/dL (ref 3.5–5.2)
Calcium: 9.3 mg/dL (ref 8.4–10.5)
Cholesterol: 202 mg/dL — ABNORMAL HIGH (ref 0–200)
Direct LDL: 135.8 mg/dL
GFR calc non Af Amer: 103.04 mL/min (ref >60)
HDL: 59 mg/dL (ref >39.00)
Potassium: 4.9 meq/L (ref 3.5–5.1)
Sodium: 141 meq/L (ref 135–145)
Total Bilirubin: 0.4 mg/dL (ref 0.3–1.2)
Triglycerides: 109 mg/dL (ref 0.0–149.0)
Vit D, 25-Hydroxy: 13 ng/mL — ABNORMAL LOW (ref 30–89)

## 2009-04-30 ENCOUNTER — Telehealth: Payer: Self-pay | Admitting: Internal Medicine

## 2009-06-23 ENCOUNTER — Ambulatory Visit: Payer: Self-pay | Admitting: Internal Medicine

## 2009-07-10 ENCOUNTER — Encounter (INDEPENDENT_AMBULATORY_CARE_PROVIDER_SITE_OTHER): Payer: Self-pay | Admitting: *Deleted

## 2009-07-23 ENCOUNTER — Ambulatory Visit: Payer: Self-pay | Admitting: Internal Medicine

## 2009-07-28 LAB — CONVERTED CEMR LAB: Vit D, 25-Hydroxy: 21 ng/mL — ABNORMAL LOW (ref 30–89)

## 2009-08-13 ENCOUNTER — Ambulatory Visit: Payer: Self-pay | Admitting: Internal Medicine

## 2009-08-19 ENCOUNTER — Ambulatory Visit: Payer: Self-pay | Admitting: Internal Medicine

## 2009-09-29 ENCOUNTER — Encounter: Payer: Self-pay | Admitting: Internal Medicine

## 2009-10-05 ENCOUNTER — Ambulatory Visit: Payer: Self-pay | Admitting: Internal Medicine

## 2009-10-05 DIAGNOSIS — E559 Vitamin D deficiency, unspecified: Secondary | ICD-10-CM | POA: Insufficient documentation

## 2009-10-07 LAB — CONVERTED CEMR LAB: Vit D, 25-Hydroxy: 18 ng/mL — ABNORMAL LOW (ref 30–89)

## 2009-10-14 ENCOUNTER — Encounter: Payer: Self-pay | Admitting: Internal Medicine

## 2009-10-22 ENCOUNTER — Encounter: Payer: Self-pay | Admitting: Internal Medicine

## 2009-11-19 ENCOUNTER — Encounter: Admission: RE | Admit: 2009-11-19 | Discharge: 2009-11-19 | Payer: Self-pay | Admitting: Orthopedic Surgery

## 2009-11-23 ENCOUNTER — Ambulatory Visit (HOSPITAL_BASED_OUTPATIENT_CLINIC_OR_DEPARTMENT_OTHER): Admission: RE | Admit: 2009-11-23 | Discharge: 2009-11-23 | Payer: Self-pay | Admitting: Orthopedic Surgery

## 2009-11-30 ENCOUNTER — Ambulatory Visit: Payer: Self-pay | Admitting: Internal Medicine

## 2009-11-30 DIAGNOSIS — M25569 Pain in unspecified knee: Secondary | ICD-10-CM | POA: Insufficient documentation

## 2009-12-01 ENCOUNTER — Encounter: Payer: Self-pay | Admitting: Internal Medicine

## 2010-02-14 ENCOUNTER — Encounter: Payer: Self-pay | Admitting: Neurosurgery

## 2010-02-14 ENCOUNTER — Encounter: Payer: Self-pay | Admitting: Internal Medicine

## 2010-02-17 ENCOUNTER — Telehealth: Payer: Self-pay | Admitting: Internal Medicine

## 2010-02-18 ENCOUNTER — Encounter
Admission: RE | Admit: 2010-02-18 | Discharge: 2010-02-18 | Payer: Self-pay | Source: Home / Self Care | Attending: Internal Medicine | Admitting: Internal Medicine

## 2010-02-18 ENCOUNTER — Encounter: Payer: Self-pay | Admitting: Internal Medicine

## 2010-02-23 ENCOUNTER — Telehealth: Payer: Self-pay | Admitting: Internal Medicine

## 2010-02-25 NOTE — Miscellaneous (Signed)
Summary: med dose change  Medications Added TRAMADOL HCL 50 MG TABS (TRAMADOL HCL) 1-2 every six hours as needed       Clinical Lists Changes  Medications: Changed medication from TRAMADOL HCL 50 MG TABS (TRAMADOL HCL) as needed to TRAMADOL HCL 50 MG TABS (TRAMADOL HCL) 1-2 every six hours as needed

## 2010-02-25 NOTE — Letter (Signed)
Summary: [facility]  [facility]   Imported By: Maryln Gottron 10/28/2009 15:11:20  _____________________________________________________________________  External Attachment:    Type:   Image     Comment:   External Document

## 2010-02-25 NOTE — Assessment & Plan Note (Signed)
Summary: follow up on Vit D/per Dr. Margaret Pyle   Vital Signs:  Patient profile:   77 year old female Weight:      155 pounds Pulse rate:   72 / minute Pulse rhythm:   regular Resp:     12 per minute BP sitting:   138 / 70  (left arm)  Vitals Entered By: Gladis Riffle, RN (April 21, 2009 9:44 AM) CC: FU lab work, completed Vit D 50000 Units a couple weeks ago Is Patient Diabetic? No   Primary Care Provider:  Birdie Sons MD  CC:  FU lab work and completed Vit D 50000 Units a couple weeks ago.  History of Present Illness:  Follow-Up Visit      This is a 77 year old woman who presents for Follow-up visit.  The patient denies chest pain and palpitations.  Since the last visit the patient notes being seen by a specialist.  The patient reports taking meds as prescribed.  When questioned about possible medication side effects, the patient notes none.  Seeing dr. Merlyn Lot for hand pain (HE IS PRESCRIBING TRAMADOL).  All other systems reviewed and were negative   Preventive Screening-Counseling & Management  Alcohol-Tobacco     Smoking Status: quit > 6 months     Year Quit: 1990  Current Problems (verified): 1)  Spinal Stenosis, Lumbar By Report ?  (ICD-724.02) 2)  Sleep Apnea, Obstructive  (ICD-327.23) 3)  Syndrome, Chronic Pain  (ICD-338.4) 4)  Diverticulosis, Colon  (ICD-562.10) 5)  Postherpetic Neuralgia  (ICD-053.19) 6)  Polymyalgia Rheumatica  (ICD-725) 7)  Osteoporosis  (ICD-733.00) 8)  Hypertension  (ICD-401.9) 9)  Hyperlipidemia With Elevated Hdl  (ICD-272.4) 10)  Depression  (ICD-311)  Current Medications (verified): 1)  Aspirin Ec 81 Mg Tbec (Aspirin) .... Take 1 Tablet By Mouth Once A Day 2)  Gabapentin 300 Mg Caps (Gabapentin) .... 3 By Mouth At Bedtime 3)  Paroxetine Hcl 40 Mg Tabs (Paroxetine Hcl) .... Take 1 Tablet By Mouth Once A Day 4)  Vitamin D 1000 Unit  Tabs (Cholecalciferol) .... Once Daily 5)  Lomotil 2.5-0.025 Mg Tabs (Diphenoxylate-Atropine) .Marland Kitchen.. 1 By  Mouth Once Daily As Needed. 6)  Tylenol 325 Mg Tabs (Acetaminophen) .... Take One or Two As Needed 7)  Dicyclomine Hcl 20 Mg Tabs (Dicyclomine Hcl) .... Take One By Mouth Twice Daily. 8)  Cholestyramine 4 Gm Pack (Cholestyramine) .... Mix One Packet in An 8oz Glass of Juice or Water Daily. 9)  Tramadol Hcl 50 Mg Tabs (Tramadol Hcl) .... As Needed  Allergies: 1)  ! Clindamycin Hcl (Clindamycin Hcl) 2)  Zoloft (Sertraline Hcl) 3)  Celebrex (Celecoxib) 4)  * Vioxx  Past History:  Past Medical History: Last updated: 06/06/2006 vertebral fracture Depression Hyperlipidemia Hypertension Osteoporosis OSA PMR postherpetic neuralgia Diverticulosis, colon previous alcohol overuse  Past Surgical History: Last updated: 01/24/2008 rotator cuff surgery breast BX vertebroplasty x3  Appendectomy D & C -X several Cataract removal left knee arthroscopy--10/2007  Family History: Last updated: 09/25/2008 father- stroke 80, hx of MI mother- alcohol, peritonitis--77 Family History of CAD Female 1st degree relative <50--father in 47's Family History of Breast Cancer: Sister Family History of Heart Disease: Sister   No FH of Colon Cancer:  Social History: Last updated: 01/24/2008 Married Never Smoked Alcohol use-no- used to drink too much    Risk Factors: Smoking Status: quit > 6 months (04/21/2009)  Review of Systems       All other systems reviewed and were negative  hand pain  is much better GI complaints are well controlled---sees dr. Juanda Chance  Physical Exam  General:  alert and well-developed.   Head:  normocephalic and atraumatic.   Eyes:  pupils equal and pupils round.   Neck:  supple and full ROM.   Lungs:  normal respiratory effort and no intercostal retractions.   Heart:  normal rate and regular rhythm.   Abdomen:  Bowel sounds positive,abdomen soft and non-tender without masses, organomegaly or hernias noted. Msk:  No deformity or scoliosis noted of thoracic or  lumbar spine.   Neurologic:  cranial nerves II-XII intact and gait normal.   Skin:  turgor normal and color normal.   Psych:  normally interactive and good eye contact.     Impression & Recommendations:  Problem # 1:  HYPERLIPIDEMIA WITH ELEVATED HDL (ICD-272.4) check labs   Her updated medication list for this problem includes:    Cholestyramine 4 Gm Pack (Cholestyramine) ..... Mix one packet in an 8oz glass of juice or water daily.  Problem # 2:  POLYMYALGIA RHEUMATICA (ICD-725) sxs resolved no steroids  Problem # 3:  SPINAL STENOSIS, LUMBAR BY REPORT ? (ICD-724.02) chronic pain but doing well  Problem # 4:  HYPERTENSION (ICD-401.9)  reasonable control check labs BP today: 138/70 Prior BP: 114/62 (02/18/2009)  Labs Reviewed: K+: 4.7 (01/30/2009) Creat: : 0.6 (01/30/2009)   Chol: 234 (12/23/2008)   HDL: 51.00 (12/23/2008)   LDL: DEL (10/17/2006)   TG: 100.0 (12/23/2008)  Orders: TLB-BMP (Basic Metabolic Panel-BMET) (80048-METABOL)  Complete Medication List: 1)  Aspirin Ec 81 Mg Tbec (Aspirin) .... Take 1 tablet by mouth once a day 2)  Gabapentin 300 Mg Caps (Gabapentin) .... 3 by mouth at bedtime 3)  Paroxetine Hcl 40 Mg Tabs (Paroxetine hcl) .... Take 1 tablet by mouth once a day 4)  Vitamin D 1000 Unit Tabs (Cholecalciferol) .... Once daily 5)  Lomotil 2.5-0.025 Mg Tabs (Diphenoxylate-atropine) .Marland Kitchen.. 1 by mouth once daily as needed. 6)  Tylenol 325 Mg Tabs (Acetaminophen) .... Take one or two as needed 7)  Dicyclomine Hcl 20 Mg Tabs (Dicyclomine hcl) .... Take one by mouth twice daily. 8)  Cholestyramine 4 Gm Pack (Cholestyramine) .... Mix one packet in an 8oz glass of juice or water daily. 9)  Tramadol Hcl 50 Mg Tabs (Tramadol hcl) .... As needed  Other Orders: TLB-Lipid Panel (80061-LIPID) TLB-Hepatic/Liver Function Pnl (80076-HEPATIC) T-Vitamin D (25-Hydroxy) ([address])  Appended Document: Orders Update     Clinical Lists Changes  Orders: Added new  Service order of Venipuncture [phone]) - Signed

## 2010-02-25 NOTE — Progress Notes (Signed)
Summary: hand surgeon?  Phone Note Call from Patient Call back at Home Phone [phone]   Caller: Patient Call For: Birdie Sons MD Summary of Call: Asking for referral to dr sypher for wrist.  Has appt with dr Charlett Blake on 02/19/09, but will cancel that if you want her to see hand surgeon. Initial call taken by: Gladis Riffle, RN,  February 05, 2009 11:40 AM  Follow-up for Phone Call        ok, she can call and make appt Follow-up by: Birdie Sons MD,  February 05, 2009 12:00 PM  Additional Follow-up for Phone Call Additional follow up Details #1::        Patient notified.  Additional Follow-up by: Gladis Riffle, RN,  February 06, 2009 8:43 AM

## 2010-02-25 NOTE — Letter (Signed)
Summary: [facility]  [facility]   Imported By: Maryln Gottron 12/11/2009 12:48:57  _____________________________________________________________________  External Attachment:    Type:   Image     Comment:   External Document

## 2010-02-25 NOTE — Progress Notes (Signed)
Summary: when rov?  Phone Note Call from Patient Call back at [facility] Phone [phone]   Caller: Patient, VM Summary of Call: When to have next OV.  Was to be determined by lab results.  got those results but not when of appt. Initial call taken by: Gladis Riffle, RN,  April 30, 2009 12:21 PM  Follow-up for Phone Call        2 months Follow-up by: Birdie Sons MD,  April 30, 2009 12:22 PM  Additional Follow-up for Phone Call Additional follow up Details #1::        Patient notified. She will call back for appt. Additional Follow-up by: Gladis Riffle, RN,  April 30, 2009 12:32 PM

## 2010-02-25 NOTE — Letter (Signed)
Summary: Office Visit Letter  [facility]  [address] [place], [place] [address]   Phone: [phone]  Fax: [phone]      July 10, 2009 MRN: [id number]   [name] [name] [address] [place], [place]  [address]   Dear Ms. Mitnick,   According to our records, it is time for you to schedule a follow-up office visit with [place].   At your convenience, please call [phone] (option #2)to schedule an office visit. If you have any questions, concerns, or feel that this letter is in error, we would appreciate your call.   Sincerely,  Hedwig Morton. Juanda Chance, M.D.  [facility] [phone]

## 2010-02-25 NOTE — Letter (Signed)
Summary: [facility]  [facility]   Imported By: Maryln Gottron 02/05/2009 13:41:52  _____________________________________________________________________  External Attachment:    Type:   Image     Comment:   External Document

## 2010-02-25 NOTE — Assessment & Plan Note (Signed)
Summary: 2 MONTH ROV/NJR/PT RSC/CJR   Vital Signs:  Patient profile:   77 year old female Pulse rate:   78 / minute Pulse rhythm:   regular BP sitting:   134 / 74  (left arm) Cuff size:   regular  Vitals Entered By: Kern Reap CMA Duncan Dull) (October 05, 2009 10:27 AM) CC: follow-up visit   Primary Care Provider:  Birdie Sons MD  CC:  follow-up visit.  History of Present Illness:  Follow-Up Visit      This is a 77 year old woman who presents for Follow-up visit.  The patient denies chest pain and palpitations.  Since the last visit the patient notes no new problems or concerns.  The patient reports taking meds as prescribed.  When questioned about possible medication side effects, the patient notes none.  She uses a cane for balance  All other systems reviewed and were negative   Current Problems (verified): 1)  Spinal Stenosis, Lumbar By Report ?  (ICD-724.02) 2)  Sleep Apnea, Obstructive  (ICD-327.23) 3)  Syndrome, Chronic Pain  (ICD-338.4) 4)  Diverticulosis, Colon  (ICD-562.10) 5)  Postherpetic Neuralgia  (ICD-053.19) 6)  Polymyalgia Rheumatica  (ICD-725) 7)  Osteoporosis  (ICD-733.00) 8)  Hypertension  (ICD-401.9) 9)  Hyperlipidemia With Elevated Hdl  (ICD-272.4) 10)  Depression  (ICD-311)  Current Medications (verified): 1)  Aspirin Ec 81 Mg Tbec (Aspirin) .... Take 1 Tablet By Mouth Once A Day 2)  Gabapentin 300 Mg Caps (Gabapentin) .... 3 By Mouth At Bedtime 3)  Paroxetine Hcl 40 Mg Tabs (Paroxetine Hcl) .... Take 1 Tablet By Mouth Once A Day 4)  Lomotil 2.5-0.025 Mg Tabs (Diphenoxylate-Atropine) .Marland Kitchen.. 1 By Mouth Once Daily As Needed. 5)  Dicyclomine Hcl 20 Mg Tabs (Dicyclomine Hcl) .... Take One By Mouth Twice Daily. 6)  Alprazolam 0.25 Mg Tabs (Alprazolam) .... 1/2-1 By Mouth Once Daily As Needed  Allergies: 1)  ! Clindamycin Hcl (Clindamycin Hcl) 2)  Zoloft (Sertraline Hcl) 3)  Celebrex (Celecoxib) 4)  * Vioxx  Past History:  Past Medical  History: Last updated: 06/06/2006 vertebral fracture Depression Hyperlipidemia Hypertension Osteoporosis OSA PMR postherpetic neuralgia Diverticulosis, colon previous alcohol overuse  Past Surgical History: Last updated: 01/24/2008 rotator cuff surgery breast BX vertebroplasty x3  Appendectomy D & C -X several Cataract removal left knee arthroscopy--10/2007  Family History: Last updated: 09/25/2008 father- stroke 38, hx of MI mother- alcohol, peritonitis--77 Family History of CAD Female 1st degree relative <50--father in 31's Family History of Breast Cancer: Sister Family History of Heart Disease: Sister   No FH of Colon Cancer:  Social History: Last updated: 01/24/2008 Married Never Smoked Alcohol use-no- used to drink too much    Risk Factors: Smoking Status: quit > 6 months (04/21/2009)  Review of Systems       Flu Vaccine Consent Questions     Do you have a history of severe allergic reactions to this vaccine? no    Any prior history of allergic reactions to egg and/or gelatin? no    Do you have a sensitivity to the preservative Thimersol? no    Do you have a past history of Guillan-Barre Syndrome? no    Do you currently have an acute febrile illness? no    Have you ever had a severe reaction to latex? no    Vaccine information given and explained to patient? yes    Are you currently pregnant? no    Lot Number:[id number]   Exp Date:07/24/2010   Site Given  Left Deltoid IM   Physical Exam  General:  alert and well-developed.   Eyes:  pupils equal and pupils round.   Ears:  R ear normal and L ear normal.   Neck:  supple and full ROM.   Chest Wall:  No deformities, masses, or tenderness noted. Lungs:  normal respiratory effort and no intercostal retractions.   Heart:  normal rate and regular rhythm.   Abdomen:  soft and non-tender.   Msk:  No deformity or scoliosis noted of thoracic or lumbar spine.   Neurologic:  cranial nerves II-XII intact and  gait normal.     Impression & Recommendations:  Problem # 1:  SPINAL STENOSIS, LUMBAR BY REPORT ? (ICD-724.02) she is doing ok---minimal pain  Problem # 2:  POLYMYALGIA RHEUMATICA (ICD-725) off of meds  Problem # 3:  DEPRESSION (ICD-311) she is doing well on meds  Her updated medication list for this problem includes:    Paroxetine Hcl 40 Mg Tabs (Paroxetine hcl) .Marland Kitchen... Take 1 tablet by mouth once a day    Alprazolam 0.25 Mg Tabs (Alprazolam) .Marland Kitchen... 1/2-1 by mouth once daily as needed  Problem # 4:  POSTHERPETIC NEURALGIA (ICD-053.19) sxs resolved  Complete Medication List: 1)  Aspirin Ec 81 Mg Tbec (Aspirin) .... Take 1 tablet by mouth once a day 2)  Gabapentin 300 Mg Caps (Gabapentin) .... 3 by mouth at bedtime 3)  Paroxetine Hcl 40 Mg Tabs (Paroxetine hcl) .... Take 1 tablet by mouth once a day 4)  Lomotil 2.5-0.025 Mg Tabs (Diphenoxylate-atropine) .Marland Kitchen.. 1 by mouth once daily as needed. 5)  Dicyclomine Hcl 20 Mg Tabs (Dicyclomine hcl) .... Take one by mouth twice daily. 6)  Alprazolam 0.25 Mg Tabs (Alprazolam) .... 1/2-1 by mouth once daily as needed  Other Orders: Flu Vaccine 73yrs + MEDICARE PATIENTS (J4782) Administration Flu vaccine - MCR (G0008) T-Vitamin D (25-Hydroxy) (95621-30865) Venipuncture (78469)  Patient Instructions: 1)  Please schedule a follow-up appointment in 2 months.  Appended Document: Orders Update     Clinical Lists Changes  Orders: Added new Service order of Specimen Handling (62952) - Signed

## 2010-02-25 NOTE — Progress Notes (Signed)
Summary: when rov?  Phone Note Call from Patient Call back at [facility] Phone [phone]   Caller: Patient, via vm Call For: Birdie Sons MD Summary of Call: Was told to make appt depending on lab work. Now wants to know when? Initial call taken by: Gladis Riffle, RN,  February 04, 2009 4:51 PM  Follow-up for Phone Call        2 months Follow-up by: Birdie Sons MD,  February 04, 2009 8:43 PM  Additional Follow-up for Phone Call Additional follow up Details #1::        Patient notified.  Additional Follow-up by: Gladis Riffle, RN,  February 05, 2009 11:38 AM

## 2010-02-25 NOTE — Assessment & Plan Note (Signed)
Summary: DIARRHEA               [name] [name]    History of Present Illness Visit Type: Follow-up Visit Primary GI MD: Lina Sar MD Primary Provider: Birdie Sons MD Requesting Provider: n/a Chief Complaint: Diarrhea f/u. Pt states she is doing 90% better since her medicine change. Pt still having some diarrhea off and on.  History of Present Illness:   This is a 77 year old white female with irritable bowel syndrome and predominant diarrhea. She called our office on 01/20/09 concerning the recurrence of her diarrhea. She was started  on Questran 4 g a day and Bentyl 20 mg twice a day with complete resolution of her symptoms. Her last office visit in September 2010 was also for diarrhea. She has a  decreased rectal sphincter tone. Her last colonoscopy in May 2004 showed mild diverticulosis of the left colon. A recall colonoscopy will be due in June 2014. Other medical problems include chronic back pain requiring narcotics, sleep apnea, depression, hyperlipidemia and hypertension.   GI Review of Systems      Denies abdominal pain, acid reflux, belching, bloating, chest pain, dysphagia with liquids, dysphagia with solids, heartburn, loss of appetite, nausea, vomiting, vomiting blood, weight loss, and  weight gain.      Reports diarrhea.     Denies anal fissure, black tarry stools, change in bowel habit, constipation, diverticulosis, fecal incontinence, heme positive stool, hemorrhoids, irritable bowel syndrome, jaundice, light color stool, liver problems, rectal bleeding, and  rectal pain.    Current Medications (verified): 1)  Aspirin Ec 81 Mg Tbec (Aspirin) .... Take 1 Tablet By Mouth Once A Day 2)  Gabapentin 300 Mg Caps (Gabapentin) .... 3 By Mouth At Bedtime 3)  Paroxetine Hcl 40 Mg Tabs (Paroxetine Hcl) .... Take 1 Tablet By Mouth Once A Day 4)  Vitamin D 1000 Unit  Tabs (Cholecalciferol) .... Once Daily 5)  Lomotil 2.5-0.025 Mg Tabs (Diphenoxylate-Atropine) .Marland Kitchen.. 1 By Mouth Once Daily As  Needed. 6)  Vitamin D (Ergocalciferol) 50000 Unit Caps (Ergocalciferol) .... Take One Tab Every Week 7)  Tylenol 325 Mg Tabs (Acetaminophen) .... Take Two Three Times A Day 8)  Dicyclomine Hcl 20 Mg Tabs (Dicyclomine Hcl) .... Take One By Mouth Twice Daily. 9)  Cholestyramine 4 Gm Pack (Cholestyramine) .... Mix One Packet in An 8oz Glass of Juice or Water Daily.  Allergies (verified): 1)  ! Clindamycin Hcl (Clindamycin Hcl) 2)  Zoloft (Sertraline Hcl) 3)  Celebrex (Celecoxib) 4)  * Vioxx  Past History:  Past Medical History: Reviewed history from 06/06/2006 and no changes required. vertebral fracture Depression Hyperlipidemia Hypertension Osteoporosis OSA PMR postherpetic neuralgia Diverticulosis, colon previous alcohol overuse  Past Surgical History: Reviewed history from 01/24/2008 and no changes required. rotator cuff surgery breast BX vertebroplasty x3  Appendectomy D & C -X several Cataract removal left knee arthroscopy--10/2007  Family History: Reviewed history from 09/25/2008 and no changes required. father- stroke 64, hx of MI mother- alcohol, peritonitis--77 Family History of CAD Female 1st degree relative <50--father in 38's Family History of Breast Cancer: Sister Family History of Heart Disease: Sister   No FH of Colon Cancer:  Social History: Reviewed history from 01/24/2008 and no changes required. Married Never Smoked Alcohol use-no- used to drink too much    Review of Systems  The patient denies allergy/sinus, anemia, anxiety-new, arthritis/joint pain, back pain, blood in urine, breast changes/lumps, change in vision, confusion, cough, coughing up blood, depression-new, fainting, fatigue, fever, headaches-new, hearing problems, heart  murmur, heart rhythm changes, itching, menstrual pain, muscle pains/cramps, night sweats, nosebleeds, pregnancy symptoms, shortness of breath, skin rash, sleeping problems, sore throat, swelling of feet/legs, swollen  lymph glands, thirst - excessive , urination - excessive , urination changes/pain, urine leakage, vision changes, and voice change.         Pertinent positive and negative review of systems were noted in the above HPI. All other ROS was otherwise negative.   Vital Signs:  Patient profile:   77 year old female Height:      64 inches Weight:      160 pounds BMI:     27.56 Pulse rate:   74 / minute Pulse rhythm:   regular BP sitting:   114 / 62  (left arm) Cuff size:   regular  Vitals Entered By: Christie Nottingham CMA Duncan Dull) (February 18, 2009 11:36 AM)  Physical Exam  General:  alert, oriented and in no distress. Eyes:  PERRLA, no icterus. Mouth:  No deformity or lesions, dentition normal. Chest Wall:  kyphosis of the thoracic spine. Lungs:  Clear throughout to auscultation. Heart:  Regular rate and rhythm; no murmurs, rubs,  or bruits. Abdomen:  Soft, nontender and nondistended. No masses, hepatosplenomegaly or hernias noted. Normal bowel sounds. Psych:  Alert and cooperative. Normal mood and affect.   Impression & Recommendations:  Problem # 1:  DIVERTICULOSIS, COLON (ICD-562.10) Patient has irritable bowel syndrome with predominant diarrhea in the setting of mild left colon diverticulosis. She has responded to Questran and antispasmodics. She is to continue on the same regimen. She may also have Lomotil one p.o. p.r.n. diarrhea. Her recall colonoscopy will be due in June 2014 but may be repeated earlier if the diarrhea persists.  Problem # 2:  SPINAL STENOSIS, LUMBAR BY REPORT ? (ICD-724.02) on chronic narcotics  Problem # 3:  DEPRESSION (ICD-311) with mild memory deficit. pt did nor remember  she saw me in Sept 2010 and also did not know about her triage call on Dec 28,2010  Patient Instructions: 1)  Lomotil one p.o. p.r.n. diarrhea 2)  Bentyl 20 mg one p.o. b.i.d. 3)  Questran 4 g p.o. q.d. 4)  High-fiber diet. 5)  Recall colonoscopy June 2014. 6)  Copy sent to : Dr  B.Swords 7)  because of her memory deficit, pt may be calling again with questions. Prescriptions: CHOLESTYRAMINE 4 GM PACK (CHOLESTYRAMINE) Mix one packet in an 8oz glass of juice or water daily.  #30 x 3   Entered by:   Hortense Ramal CMA (AAMA)   Authorized by:   Hart Carwin MD   Signed by:   Hortense Ramal CMA (AAMA) on 02/18/2009   Method used:   Electronically to        [facility]* (retail)       [address]       [place], [place]  [id number]       Ph: [phone]       Fax: [phone]   RxID:   [id number] DICYCLOMINE HCL 20 MG TABS (DICYCLOMINE HCL) Take one by mouth twice daily.  #60 x 6   Entered by:   Hortense Ramal CMA (AAMA)   Authorized by:   Hart Carwin MD   Signed by:   Hortense Ramal CMA (AAMA) on 02/18/2009   Method used:   Electronically to        [facility]* (retail)       [address]  [place], [place]  [address]       Ph: [phone]       Fax: [phone]   RxID:   [id number] LOMOTIL 2.5-0.025 MG TABS (DIPHENOXYLATE-ATROPINE) 1 by mouth once daily as needed.  #60 x 1   Entered by:   Hortense Ramal CMA (AAMA)   Authorized by:   Hart Carwin MD   Signed by:   Hortense Ramal CMA (AAMA) on 02/18/2009   Method used:   Printed then faxed to ...       [facility]* (retail)       [address]       [place], [place]  [id number]       Ph: [phone]       Fax: [phone]   RxID:   [id number]

## 2010-02-25 NOTE — Miscellaneous (Signed)
Summary: Cholestyramine refill  Clinical Lists Changes  Medications: Changed medication from CHOLESTYRAMINE 4 GM PACK (CHOLESTYRAMINE) Mix one packet in an 8oz glass of juice or water daily. to CHOLESTYRAMINE 4 GM PACK (CHOLESTYRAMINE) Mix one packet in an 8oz glass of juice or water daily. - Signed Rx of CHOLESTYRAMINE 4 GM PACK (CHOLESTYRAMINE) Mix one packet in an 8oz glass of juice or water daily.;  #30 x 3;  Signed;  Entered by: Christie Nottingham CMA (AAMA);  Authorized by: Hart Carwin MD;  Method used: Electronically to [facility]*, [address] [place], [place]  [id number], Ph: [phone], Fax: [phone]    Prescriptions: CHOLESTYRAMINE 4 GM PACK (CHOLESTYRAMINE) Mix one packet in an 8oz glass of juice or water daily.  #30 x 3   Entered by:   Christie Nottingham CMA (AAMA)   Authorized by:   Hart Carwin MD   Signed by:   Christie Nottingham CMA (AAMA) on 03/23/2009   Method used:   Electronically to        [facility]* (retail)       [address]       [place], [place]  [id number]       Ph: [phone]       Fax: [phone]   RxID:   [id number]

## 2010-02-25 NOTE — Assessment & Plan Note (Signed)
Summary: 2 month fup//ccm   Vital Signs:  Patient profile:   77 year old female Temp:     98.4 degrees F oral Pulse rate:   80 / minute BP sitting:   130 / 70  (left arm) Cuff size:   regular  Vitals Entered By: Kathrynn Speed CMA (August 19, 2009 8:21 AM) CC: 2 mth fup, src   Primary Care Provider:  Birdie Sons MD  CC:  2 mth fup and src.  History of Present Illness:  Follow-Up Visit      This is a 77 year old woman who presents for Follow-up visit.  The patient denies chest pain and palpitations.  Since the last visit the patient notes no new problems or concerns except has seen dermatology---prurigo nodularis. secondary condition of "picking" skin. has seen psychology---advised xanax once daily.   The patient reports taking meds as prescribed.  When questioned about possible medication side effects, the patient notes none.    All other systems reviewed and were negative   Current Problems (verified): 1)  Spinal Stenosis, Lumbar By Report ?  (ICD-724.02) 2)  Sleep Apnea, Obstructive  (ICD-327.23) 3)  Syndrome, Chronic Pain  (ICD-338.4) 4)  Diverticulosis, Colon  (ICD-562.10) 5)  Postherpetic Neuralgia  (ICD-053.19) 6)  Polymyalgia Rheumatica  (ICD-725) 7)  Osteoporosis  (ICD-733.00) 8)  Hypertension  (ICD-401.9) 9)  Hyperlipidemia With Elevated Hdl  (ICD-272.4) 10)  Depression  (ICD-311)  Current Medications (verified): 1)  Aspirin Ec 81 Mg Tbec (Aspirin) .... Take 1 Tablet By Mouth Once A Day 2)  Gabapentin 300 Mg Caps (Gabapentin) .... 3 By Mouth At Bedtime 3)  Paroxetine Hcl 40 Mg Tabs (Paroxetine Hcl) .... Take 1 Tablet By Mouth Once A Day 4)  Lomotil 2.5-0.025 Mg Tabs (Diphenoxylate-Atropine) .Marland Kitchen.. 1 By Mouth Once Daily As Needed. 5)  Dicyclomine Hcl 20 Mg Tabs (Dicyclomine Hcl) .... Take One By Mouth Twice Daily. 6)  Ergocalciferol 50000 Unit Caps (Ergocalciferol) .... One By Mouth Weekly X 12 Weeks--Call Office For Vitamin D Level To Be Done 4 Weeks Upon  Completion  Allergies (verified): 1)  ! Clindamycin Hcl (Clindamycin Hcl) 2)  Zoloft (Sertraline Hcl) 3)  Celebrex (Celecoxib) 4)  * Vioxx  Past History:  Past Medical History: Last updated: 06/06/2006 vertebral fracture Depression Hyperlipidemia Hypertension Osteoporosis OSA PMR postherpetic neuralgia Diverticulosis, colon previous alcohol overuse  Past Surgical History: Last updated: 01/24/2008 rotator cuff surgery breast BX vertebroplasty x3  Appendectomy D & C -X several Cataract removal left knee arthroscopy--10/2007  Family History: Last updated: 09/25/2008 father- stroke 9, hx of MI mother- alcohol, peritonitis--77 Family History of CAD Female 1st degree relative <50--father in 24's Family History of Breast Cancer: Sister Family History of Heart Disease: Sister   No FH of Colon Cancer:  Social History: Last updated: 01/24/2008 Married Never Smoked Alcohol use-no- used to drink too much    Risk Factors: Smoking Status: quit > 6 months (04/21/2009)  Physical Exam  General:  alert and well-developed.   Head:  normocephalic and atraumatic.   Eyes:  pupils equal and pupils round.   Ears:  R ear normal and L ear normal.   Neck:  supple and full ROM.   Chest Wall:  No deformities, masses, or tenderness noted. Lungs:  normal respiratory effort and no intercostal retractions.   Heart:  normal rate and regular rhythm.   Abdomen:  soft and non-tender.   Msk:  No deformity or scoliosis noted of thoracic or lumbar spine.   Neurologic:  cranial nerves II-XII intact and gait normal.     Impression & Recommendations:  Problem # 1:  SYNDROME, CHRONIC PAIN (ICD-338.4) she is doing well  Problem # 2:  OTHER SPEC HYPERTROPHIC&ATROPHIC CONDITION SKIN (ICD-701.8) prurig nodularis per pt report has secondary condition of "skin picking"---trial alprazolam as needed   Problem # 3:  HYPERLIPIDEMIA WITH ELEVATED HDL (ICD-272.4) no need for treatmtne The  following medications were removed from the medication list:    Cholestyramine 4 Gm Pack (Cholestyramine) ..... Mix one packet in an 8oz glass of juice or water daily.  Problem # 4:  POLYMYALGIA RHEUMATICA (ICD-725) off prednisone  Problem # 5:  DEPRESSION (ICD-311) will add xanax for some OCD behaviour.  Her updated medication list for this problem includes:    Paroxetine Hcl 40 Mg Tabs (Paroxetine hcl) .Marland Kitchen... Take 1 tablet by mouth once a day  Complete Medication List: 1)  Aspirin Ec 81 Mg Tbec (Aspirin) .... Take 1 tablet by mouth once a day 2)  Gabapentin 300 Mg Caps (Gabapentin) .... 3 by mouth at bedtime 3)  Paroxetine Hcl 40 Mg Tabs (Paroxetine hcl) .... Take 1 tablet by mouth once a day 4)  Lomotil 2.5-0.025 Mg Tabs (Diphenoxylate-atropine) .Marland Kitchen.. 1 by mouth once daily as needed. 5)  Dicyclomine Hcl 20 Mg Tabs (Dicyclomine hcl) .... Take one by mouth twice daily. 6)  Ergocalciferol 50000 Unit Caps (Ergocalciferol) .... One by mouth weekly x 12 weeks--call office for vitamin d level to be done 4 weeks upon completion  Patient Instructions: 1)  Please schedule a follow-up appointment in 2 months.  Appended Document: 2 month fup//ccm Medications Added ALPRAZOLAM 0.25 MG TABS (ALPRAZOLAM) 1/2-1 by mouth once daily as needed          Clinical Lists Changes  Medications: Added new medication of ALPRAZOLAM 0.25 MG TABS (ALPRAZOLAM) 1/2-1 by mouth once daily as needed - Signed Rx of ALPRAZOLAM 0.25 MG TABS (ALPRAZOLAM) 1/2-1 by mouth once daily as needed;  #30 x 1;  Signed;  Entered by: Birdie Sons MD;  Authorized by: Birdie Sons MD;  Method used: Print then Give to Patient    Prescriptions: ALPRAZOLAM 0.25 MG TABS (ALPRAZOLAM) 1/2-1 by mouth once daily as needed  #30 x 1   Entered and Authorized by:   Birdie Sons MD   Signed by:   Birdie Sons MD on 08/19/2009   Method used:   Print then Give to Patient   RxID:   [id number]

## 2010-02-25 NOTE — Miscellaneous (Signed)
Summary: Dicyclomine refill  Clinical Lists Changes  Medications: Rx of DICYCLOMINE HCL 20 MG TABS (DICYCLOMINE HCL) Take one by mouth twice daily.;  #60 x 5;  Signed;  Entered by: Christie Nottingham CMA (AAMA);  Authorized by: Meryl Dare MD Clementeen Graham;  Method used: Electronically to [facility]*, [address], [place], [place]  [address], Ph: [phone], Fax: [phone]    Prescriptions: DICYCLOMINE HCL 20 MG TABS (DICYCLOMINE HCL) Take one by mouth twice daily.  #60 x 5   Entered by:   Christie Nottingham CMA (AAMA)   Authorized by:   Meryl Dare MD [facility]   Signed by:   Christie Nottingham CMA Duncan Dull) on 03/20/2009   Method used:   Electronically to        [facility]* (retail)       [address]       [place], [place]  [address]       Ph: [phone]       Fax: [phone]   RxID:   [id number]

## 2010-02-25 NOTE — Miscellaneous (Signed)
Summary: Orders Update   Clinical Lists Changes  Orders: Added new Referral order of Radiology Referral (Radiology) - Signed 

## 2010-02-25 NOTE — Assessment & Plan Note (Signed)
Summary: 2 MTH ROV // RS/PT RSC/CJR   Vital Signs:  Patient profile:   77 year old female Temp:     98.2 degrees F oral Pulse rate:   79 / minute Resp:     12 per minute BP sitting:   136 / 70  Vitals Entered By: Lynann Beaver CMA (Jun 23, 2009 8:49 AM)  Primary Care Provider:  Birdie Sons MD   History of Present Illness:  Follow-Up Visit      This is a 77 year old woman who presents for Follow-up visit.  The patient denies chest pain and palpitations.  Since the last visit the patient notes no new problems or concerns.  The patient reports taking meds as prescribed.  When questioned about possible medication side effects, the patient notes none.   taking vit D chronic pain syndrome---controlled with neurontin (2-3 at bedtime )  All other systems reviewed and were negative   Current Problems (verified): 1)  Spinal Stenosis, Lumbar By Report ?  (ICD-724.02) 2)  Sleep Apnea, Obstructive  (ICD-327.23) 3)  Syndrome, Chronic Pain  (ICD-338.4) 4)  Diverticulosis, Colon  (ICD-562.10) 5)  Postherpetic Neuralgia  (ICD-053.19) 6)  Polymyalgia Rheumatica  (ICD-725) 7)  Osteoporosis  (ICD-733.00) 8)  Hypertension  (ICD-401.9) 9)  Hyperlipidemia With Elevated Hdl  (ICD-272.4) 10)  Depression  (ICD-311)  Current Medications (verified): 1)  Aspirin Ec 81 Mg Tbec (Aspirin) .... Take 1 Tablet By Mouth Once A Day 2)  Gabapentin 300 Mg Caps (Gabapentin) .... 3 By Mouth At Bedtime 3)  Paroxetine Hcl 40 Mg Tabs (Paroxetine Hcl) .... Take 1 Tablet By Mouth Once A Day 4)  Lomotil 2.5-0.025 Mg Tabs (Diphenoxylate-Atropine) .Marland Kitchen.. 1 By Mouth Once Daily As Needed. 5)  Tylenol 325 Mg Tabs (Acetaminophen) .... Take One or Two As Needed 6)  Dicyclomine Hcl 20 Mg Tabs (Dicyclomine Hcl) .... Take One By Mouth Twice Daily. 7)  Cholestyramine 4 Gm Pack (Cholestyramine) .... Mix One Packet in An 8oz Glass of Juice or Water Daily. 8)  Ergocalciferol 50000 Unit Caps (Ergocalciferol) .... One By Mouth  Weekly  Allergies (verified): 1)  ! Clindamycin Hcl (Clindamycin Hcl) 2)  Zoloft (Sertraline Hcl) 3)  Celebrex (Celecoxib) 4)  * Vioxx  Past History:  Past Medical History: Last updated: 06/06/2006 vertebral fracture Depression Hyperlipidemia Hypertension Osteoporosis OSA PMR postherpetic neuralgia Diverticulosis, colon previous alcohol overuse  Past Surgical History: Last updated: 01/24/2008 rotator cuff surgery breast BX vertebroplasty x3  Appendectomy D & C -X several Cataract removal left knee arthroscopy--10/2007  Family History: Last updated: 09/25/2008 father- stroke 20, hx of MI mother- alcohol, peritonitis--77 Family History of CAD Female 1st degree relative <50--father in 24's Family History of Breast Cancer: Sister Family History of Heart Disease: Sister   No FH of Colon Cancer:  Social History: Last updated: 01/24/2008 Married Never Smoked Alcohol use-no- used to drink too much    Risk Factors: Smoking Status: quit > 6 months (04/21/2009)  Physical Exam  General:  alert and well-developed.   Head:  normocephalic and atraumatic.   Eyes:  pupils equal and pupils round.   Ears:  R ear normal and L ear normal.   Neck:  supple and full ROM.   Chest Wall:  No deformities, masses, or tenderness noted. Lungs:  normal respiratory effort and no intercostal retractions.   Heart:  normal rate and regular rhythm.   Abdomen:  soft and non-tender.   Msk:  No deformity or scoliosis noted of thoracic or lumbar  spine.   Pulses:  R radial normal and L radial normal.   Neurologic:  cranial nerves II-XII intact and strength normal in all extremities.   Skin:  turgor normal and color normal.   Cervical Nodes:  no anterior cervical adenopathy and no posterior cervical adenopathy.   Psych:  memory intact for recent and remote and normally interactive.     Impression & Recommendations:  Problem # 1:  HYPERTENSION (ICD-401.9) controlled on no meds BP today:  136/70 Prior BP: 138/70 (04/21/2009)  Labs Reviewed: K+: 4.9 (04/21/2009) Creat: : 0.6 (04/21/2009)   Chol: 202 (04/21/2009)   HDL: 59.00 (04/21/2009)   LDL: DEL (10/17/2006)   TG: 109.0 (04/21/2009)  Problem # 2:  HYPERLIPIDEMIA WITH ELEVATED HDL (ICD-272.4)  no treatment necessary Her updated medication list for this problem includes:    Cholestyramine 4 Gm Pack (Cholestyramine) ..... Mix one packet in an 8oz glass of juice or water daily.  Labs Reviewed: SGOT: 26 (04/21/2009)   SGPT: 20 (04/21/2009)   HDL:59.00 (04/21/2009), 51.00 (12/23/2008)  LDL:DEL (10/17/2006)  Chol:202 (04/21/2009), 234 (12/23/2008)  Trig:109.0 (04/21/2009), 100.0 (12/23/2008)  Problem # 3:  SYNDROME, CHRONIC PAIN (ICD-338.4) she is doing well on   Complete Medication List: 1)  Aspirin Ec 81 Mg Tbec (Aspirin) .... Take 1 tablet by mouth once a day 2)  Gabapentin 300 Mg Caps (Gabapentin) .... 3 by mouth at bedtime 3)  Paroxetine Hcl 40 Mg Tabs (Paroxetine hcl) .... Take 1 tablet by mouth once a day 4)  Lomotil 2.5-0.025 Mg Tabs (Diphenoxylate-atropine) .Marland Kitchen.. 1 by mouth once daily as needed. 5)  Tylenol 325 Mg Tabs (Acetaminophen) .... Take one or two as needed 6)  Dicyclomine Hcl 20 Mg Tabs (Dicyclomine hcl) .... Take one by mouth twice daily. 7)  Cholestyramine 4 Gm Pack (Cholestyramine) .... Mix one packet in an 8oz glass of juice or water daily. 8)  Ergocalciferol 50000 Unit Caps (Ergocalciferol) .... One by mouth weekly  Patient Instructions: 1)  Please schedule a follow-up appointment in 2 months. 2)  lab work only in one month 3)  Vit D

## 2010-02-25 NOTE — Assessment & Plan Note (Signed)
Summary: 6 week follow up/cjr   Vital Signs:  Patient profile:   77 year old female Temp:     97.3 degrees F Pulse rate:   72 / minute BP sitting:   138 / 70  (left arm)  Vitals Entered By: Gladis Riffle, RN (January 30, 2009 10:50 AM)   Primary Care Provider:  Birdie Sons MD   History of Present Illness:  Follow-Up Visit      This is a 77 year old woman who presents for Follow-up visit.  The patient denies chest pain, palpitations, dizziness, syncope, edema, SOB, DOE, PND, and orthopnea.  Since the last visit the patient notes being seen by a specialist.  The patient reports taking meds as prescribed.  When questioned about possible medication side effects, the patient notes none.    seeing ortho for wrist pain---started tramadol  All other systems reviewed and were negative   Preventive Screening-Counseling & Management  Alcohol-Tobacco     Smoking Status: quit > 6 months     Year Quit: 1990  Allergies: 1)  ! Clindamycin Hcl (Clindamycin Hcl) 2)  Zoloft (Sertraline Hcl) 3)  Celebrex (Celecoxib) 4)  * Vioxx  Comments:  Nurse/Medical Assistant: 6 week rov; states has "bone on bone" left wrist and pain right wrist so put on pain med by dr Charlett Blake  The patient's medications and allergies were reviewed with the patient and were updated in the Medication and Allergy Lists. Gladis Riffle, RN (January 30, 2009 10:53 AM)  Physical Exam  General:  alert, well-developed, and well-nourished.   well groomed walking with a cane Head:  normocephalic and atraumatic.   Eyes:  pupils equal and pupils round.   Ears:  R ear normal and L ear normal.   Neck:  No deformities, masses, or tenderness noted. Lungs:  normal respiratory effort and no accessory muscle use.   Heart:  normal rate and regular rhythm.   Msk:  tenderness base of left thumb no real swelling Pulses:  R radial normal and L radial normal.   Neurologic:  cranial nerves II-XII intact and gait normal.     Impression &  Recommendations:  Problem # 1:  HYPERTENSION (ICD-401.9)  fair control BP today: 138/70 Prior BP: 136/74 (12/23/2008)  Labs Reviewed: K+: 5.4 (12/23/2008) Creat: : 0.6 (12/23/2008)   Chol: 234 (12/23/2008)   HDL: 51.00 (12/23/2008)   LDL: DEL (10/17/2006)   TG: 100.0 (12/23/2008)  Orders: Venipuncture ([address]) TLB-BMP (Basic Metabolic Panel-BMET) (80048-METABOL)  Problem # 2:  SPINAL STENOSIS, LUMBAR BY REPORT ? (ICD-724.02) sxs are better  Problem # 3:  SYNDROME, CHRONIC PAIN (ICD-338.4) now sith wrist pain---seeing dr Charlett Blake  Problem # 4:  HYPERLIPIDEMIA WITH ELEVATED HDL (ICD-272.4) no treatment necessary Her updated medication list for this problem includes:    Cholestyramine 4 Gm Pack (Cholestyramine) ..... Mix one packet in an 8oz glass of juice or water daily.  Complete Medication List: 1)  Aspirin Ec 81 Mg Tbec (Aspirin) .... Take 1 tablet by mouth once a day 2)  Gabapentin 300 Mg Caps (Gabapentin) .... 3 by mouth at bedtime 3)  Paroxetine Hcl 40 Mg Tabs (Paroxetine hcl) .... Take 1 tablet by mouth once a day 4)  Vitamin D 1000 Unit Tabs (Cholecalciferol) .... Once daily 5)  Lomotil 2.5-0.025 Mg Tabs (Diphenoxylate-atropine) .Marland Kitchen.. 1 by mouth once daily as needed. 6)  Vitamin D (ergocalciferol) 50000 Unit Caps (Ergocalciferol) .... Take one tab every week 7)  Tylenol 325 Mg Tabs (Acetaminophen) .Marland KitchenMarland KitchenMarland Kitchen  Take two three times a day 8)  Dicyclomine Hcl 20 Mg Tabs (Dicyclomine hcl) .... Take one by mouth twice daily. 9)  Cholestyramine 4 Gm Pack (Cholestyramine) .... Mix one packet in an 8oz glass of juice or water daily. 10)  Tramadol Hcl 50 Mg Tabs (Tramadol hcl) .... Take one every six hours as needed pain

## 2010-02-25 NOTE — Miscellaneous (Signed)
Summary: Lomotil Refill   ---- 09/29/2009 10:12 AM, Karen Kitchens Nelson-Smith CMA (AAMA) wrote: I have a refill request for patient to get lomotil....we havent given her any since late January, but in your last office visit note, looks like we were trying to taper some of her IBS medications...do you want me to give her refills of this or was this something that she was also to taper?  ---- 09/29/2009 11:43 AM, Hart Carwin MD wrote: Rip Harbour to refill Lomotil. I have reviewed my last OV.    Clinical Lists Changes  Medications: Rx of LOMOTIL 2.5-0.025 MG TABS (DIPHENOXYLATE-ATROPINE) 1 by mouth once daily as needed.;  #60 x 1;  Signed;  Entered by: Lamona Curl CMA (AAMA);  Authorized by: Hart Carwin MD;  Method used: Printed then [id number] [address], [place], [place], [place]  [address], Ph: [phone], Fax: [phone]    Prescriptions: LOMOTIL 2.5-0.025 MG TABS (DIPHENOXYLATE-ATROPINE) 1 by mouth once daily as needed.  #60 x 1   Entered by:   Lamona Curl CMA (AAMA)   Authorized by:   Hart Carwin MD   Signed by:   Lamona Curl CMA (AAMA) on 09/29/2009   Method used:   Printed then faxed to ...       [facility] [id number] (retail)       [address]       [place], [place]  [address]       Ph: [phone]       Fax: [phone]   RxID:   [id number]

## 2010-02-25 NOTE — Progress Notes (Signed)
  Phone Note Call from Patient Call back at Home Phone [phone]   Summary of Call: pt has appt for epidural on 2/8 and she is in pain and wants [place] to see if she can get in earlier.  Appt is with Dr Karin Golden.  Per Dr Cato Mulligan call [facility] and see if they can call pt to discuss moving up appt. Initial call taken by: [name], CMA,  February 17, 2010 11:26 AM  Follow-up for Phone Call        per Duwayne Heck at [facility] called and said that pt does not have appt with Dr Karin Golden on 2/8.  I called [facility] and she does not have appt there neither.  Called pt and she is going to call [facility] and make appt and call us back if needed Follow-up by: [name], CMA,  February 17, 2010 4:33 PM

## 2010-02-25 NOTE — Letter (Signed)
Summary: [facility]  [facility]   Imported By: Maryln Gottron 04/07/2009 14:50:49  _____________________________________________________________________  External Attachment:    Type:   Image     Comment:   External Document

## 2010-02-25 NOTE — Assessment & Plan Note (Signed)
Summary: shingles/dm   Vital Signs:  Patient profile:   77 year old female Weight:      167 pounds Temp:     98.0 degrees F oral BP sitting:   122 / 80  (right arm) Cuff size:   regular  Vitals Entered By: Duard Brady LPN (November 30, 2009 3:35 PM) CC: c/o rash? itching? around surgical site (R) knee - surg last mon.  Is Patient Diabetic? No   Primary Care Provider:  Birdie Sons MD  CC:  c/o rash? itching? around surgical site (R) knee - surg last mon. Marland Kitchen  History of Present Illness: 77 year old patient who is status post right knee arthroscopic surgery.  It was suggested she be checked out by the orthopedic PA due to possible shingles involving the right knee area.  She had orthoscopic surgery 7 days ago.  She has history of hypertension and dyslipidemia.  Presently off medication.  Allergies: 1)  ! Clindamycin Hcl (Clindamycin Hcl) 2)  Zoloft (Sertraline Hcl) 3)  Celebrex (Celecoxib) 4)  * Vioxx  Past History:  Past Medical History: Reviewed history from 06/06/2006 and no changes required. vertebral fracture Depression Hyperlipidemia Hypertension Osteoporosis OSA PMR postherpetic neuralgia Diverticulosis, colon previous alcohol overuse  Physical Exam  General:  Well-developed,well-nourished,in no acute distress; alert,appropriate and cooperative throughout examination Msk:  examination the right knee revealed two arthroscopic incision sites with sutures still in place; there was some soft tissue reaction about the sutures but the incisions appear to be clean.  There is moderate effusion; no dermatitis to suggest shingles   Impression & Recommendations:  Problem # 1:  HYPERTENSION (ICD-401.9)  Problem # 2:  KNEE PAIN (ICD-719.46)  Her updated medication list for this problem includes:    Aspirin Ec 81 Mg Tbec (Aspirin) .Marland Kitchen... Take 1 tablet by mouth once a day  Complete Medication List: 1)  Aspirin Ec 81 Mg Tbec (Aspirin) .... Take 1 tablet by mouth  once a day 2)  Gabapentin 300 Mg Caps (Gabapentin) .... 3 by mouth at bedtime 3)  Paroxetine Hcl 40 Mg Tabs (Paroxetine hcl) .... Take 1 tablet by mouth once a day 4)  Lomotil 2.5-0.025 Mg Tabs (Diphenoxylate-atropine) .Marland Kitchen.. 1 by mouth once daily as needed. 5)  Dicyclomine Hcl 20 Mg Tabs (Dicyclomine hcl) .... Take one by mouth twice daily. 6)  Alprazolam 0.25 Mg Tabs (Alprazolam) .... 1/2-1 by mouth once daily as needed 7)  Vitamin D (ergocalciferol) 50000 Unit Caps (Ergocalciferol) .... Po every week for 12 weeks.  have vit d level rechecked in 16 weeks.  Patient Instructions: 1)  Please schedule a follow-up appointment as needed. 2)  Please return for a FASTING Lipid Profile one (1) week before your next appointment as scheduled. 3)  Limit your Sodium (Salt).   Orders Added: 1)  Est. Patient Level III [[address]]

## 2010-02-25 NOTE — Progress Notes (Signed)
Summary: REFILL paroxetine  Phone Note Refill Request Message from:  Fax from Pharmacy  Refills Requested: Medication #1:  PAROXETINE HCL 40 MG TABS Take 1 tablet by mouth once a day [facility]---[phone]    FAX---[phone]  Initial call taken by: Warnell Forester,  March 23, 2009 9:04 AM    Prescriptions: PAROXETINE HCL 40 MG TABS (PAROXETINE HCL) Take 1 tablet by mouth once a day  #100 Each x 3   Entered by:   Gladis Riffle, RN   Authorized by:   Birdie Sons MD   Signed by:   Gladis Riffle, RN on 03/23/2009   Method used:   Electronically to        [facility]* (retail)       [address]       [place], [place]  [id number]       Ph: [phone]       Fax: [phone]   RxID:   [id number]

## 2010-02-25 NOTE — Assessment & Plan Note (Signed)
Summary: F.U.Marland KitchenMarland KitchenEM    History of Present Illness Primary GI MD: Lina Sar MD Primary Provider: Birdie Sons MD Requesting Provider: n/a Chief Complaint: f/u diarrhea. Pt denies any GI sx at this time.  History of Present Illness:   This is a 77 year old who has irritable bowel syndrome with predominant diarrhea. Her last appointment was in December 2010 for diarrhea and incontinence. She has responded to Questran 4 g a day and dicyclomine 20 mg twice a day. She currently has normal bowel habits, no accidents and no abdominal pain. Other medical problems include anemia, postherpetic neuralgia, history of alcohol abuse, PMR, and sleep apnea. Her last colonoscopy in 2004 for neoplastic screening showed mild left colon diverticulosis. She has chronic back pain and is dependent on narcotics.   GI Review of Systems      Denies abdominal pain, acid reflux, belching, bloating, chest pain, dysphagia with liquids, dysphagia with solids, heartburn, loss of appetite, nausea, vomiting, vomiting blood, weight loss, and  weight gain.        Denies anal fissure, black tarry stools, change in bowel habit, constipation, diarrhea, diverticulosis, fecal incontinence, heme positive stool, hemorrhoids, irritable bowel syndrome, jaundice, light color stool, liver problems, rectal bleeding, and  rectal pain.    Current Medications (verified): 1)  Aspirin Ec 81 Mg Tbec (Aspirin) .... Take 1 Tablet By Mouth Once A Day 2)  Gabapentin 300 Mg Caps (Gabapentin) .... 3 By Mouth At Bedtime 3)  Paroxetine Hcl 40 Mg Tabs (Paroxetine Hcl) .... Take 1 Tablet By Mouth Once A Day 4)  Lomotil 2.5-0.025 Mg Tabs (Diphenoxylate-Atropine) .Marland Kitchen.. 1 By Mouth Once Daily As Needed. 5)  Tylenol 325 Mg Tabs (Acetaminophen) .... Take One or Two As Needed 6)  Dicyclomine Hcl 20 Mg Tabs (Dicyclomine Hcl) .... Take One By Mouth Twice Daily. 7)  Cholestyramine 4 Gm Pack (Cholestyramine) .... Mix One Packet in An 8oz Glass of Juice or Water  Daily. 8)  Ergocalciferol 50000 Unit Caps (Ergocalciferol) .... One By Mouth Weekly X 12 Weeks--Call Office For Vitamin D Level To Be Done 4 Weeks Upon Completion  Allergies (verified): 1)  ! Clindamycin Hcl (Clindamycin Hcl) 2)  Zoloft (Sertraline Hcl) 3)  Celebrex (Celecoxib) 4)  * Vioxx  Past History:  Past Medical History: Reviewed history from 06/06/2006 and no changes required. vertebral fracture Depression Hyperlipidemia Hypertension Osteoporosis OSA PMR postherpetic neuralgia Diverticulosis, colon previous alcohol overuse  Past Surgical History: Reviewed history from 01/24/2008 and no changes required. rotator cuff surgery breast BX vertebroplasty x3  Appendectomy D & C -X several Cataract removal left knee arthroscopy--10/2007  Family History: Reviewed history from 09/25/2008 and no changes required. father- stroke 81, hx of MI mother- alcohol, peritonitis--77 Family History of CAD Female 1st degree relative <50--father in 61's Family History of Breast Cancer: Sister Family History of Heart Disease: Sister   No FH of Colon Cancer:  Social History: Reviewed history from 01/24/2008 and no changes required. Married Never Smoked Alcohol use-no- used to drink too much    Review of Systems  The patient denies allergy/sinus, anemia, anxiety-new, arthritis/joint pain, back pain, blood in urine, breast changes/lumps, change in vision, confusion, cough, coughing up blood, depression-new, fainting, fatigue, fever, headaches-new, hearing problems, heart murmur, heart rhythm changes, itching, menstrual pain, muscle pains/cramps, night sweats, nosebleeds, pregnancy symptoms, shortness of breath, skin rash, sleeping problems, sore throat, swelling of feet/legs, swollen lymph glands, thirst - excessive , urination - excessive , urination changes/pain, urine leakage, vision changes, and voice change.  Pertinent positive and negative review of systems were noted in  the above HPI. All other ROS was otherwise negative.   Vital Signs:  Patient profile:   77 year old female Height:      64 inches Weight:      162 pounds BMI:     27.91 Pulse rate:   80 / minute Pulse rhythm:   regular BP sitting:   104 / 58  (right arm) Cuff size:   regular  Vitals Entered By: Christie Nottingham CMA Duncan Dull) (August 13, 2009 2:06 PM)   Impression & Recommendations:  Problem # 1:  DIVERTICULOSIS, COLON (ICD-562.10) Patient is currently asymptomatic on a high-fiber diet, Questran 4 g daily and Bentyl 20 mg p.o. b.i.d. We have discussed tapering her dicyclomine and cutting down on her Questran to 2 g daily or every other day. She will try to decrease her medications. A recall colonoscopy will be due in 2014.  Patient Instructions: 1)  high-fiber diet. 2)  Recall colonoscopy 2014. 3)  Continue Bentyl 20 mg p.r.n. 4)  May decrease Questran from 4 g a day to 2 g a day. 5)  Copy sent to : Dr B.Swords 6)  The medication list was reviewed and reconciled.  All changed / newly prescribed medications were explained.  A complete medication list was provided to the patient / caregiver.

## 2010-03-03 ENCOUNTER — Telehealth: Payer: Self-pay | Admitting: *Deleted

## 2010-03-03 DIAGNOSIS — M48061 Spinal stenosis, lumbar region without neurogenic claudication: Secondary | ICD-10-CM

## 2010-03-03 NOTE — Telephone Encounter (Signed)
Order placed, pt aware 

## 2010-03-03 NOTE — Telephone Encounter (Signed)
I believe she can call for the back injection. If radiology needs in order we will provide that.

## 2010-03-03 NOTE — Telephone Encounter (Signed)
Pt states Radiology needs order.

## 2010-03-03 NOTE — Telephone Encounter (Signed)
Pt needs Dr. Cato Mulligan to call her Radiologist for another back injection ASAP.

## 2010-03-03 NOTE — Progress Notes (Signed)
  Phone Note Call from Patient Call back at Home Phone [phone]   Caller: Patient Call For: Birdie Sons MD Summary of Call: Pt had Epidural last Wed, and is having severe back pain.  [facility] will not give her pain meds.  She had been taking Oxycontin, but it was left over.  [place]. Initial call taken by: [facility] CMA AAMA,  February 23, 2010 11:51 AM  Follow-up for Phone Call        trial tramadol 50 mg by mouth two times a day as needed severe pain.  #20/1 refill Follow-up by: Birdie Sons MD,  February 23, 2010 12:58 PM    New/Updated Medications: TRAMADOL HCL 50 MG TABS (TRAMADOL HCL) one by mouth two times a day as needed for severe pain Prescriptions: TRAMADOL HCL 50 MG TABS (TRAMADOL HCL) one by mouth two times a day as needed for severe pain  #20 x 1   Entered by:   Lynann Beaver CMA AAMA   Authorized by:   Birdie Sons MD   Signed by:   Lynann Beaver CMA AAMA on 02/23/2010   Method used:   Electronically to        [facility]* (retail)       [address]       [place], [place]  [id number]       Ph: [phone]       Fax: [phone]   RxID:   [id number]

## 2010-03-04 ENCOUNTER — Other Ambulatory Visit: Payer: Self-pay | Admitting: Internal Medicine

## 2010-03-04 DIAGNOSIS — M48061 Spinal stenosis, lumbar region without neurogenic claudication: Secondary | ICD-10-CM

## 2010-03-05 ENCOUNTER — Ambulatory Visit
Admission: RE | Admit: 2010-03-05 | Discharge: 2010-03-05 | Disposition: A | Discharge status: Home / Self Care | Payer: Medicare Other | Source: Ambulatory Visit | Attending: Internal Medicine | Admitting: Internal Medicine

## 2010-03-05 ENCOUNTER — Other Ambulatory Visit: Payer: Self-pay | Admitting: Internal Medicine

## 2010-03-05 DIAGNOSIS — M48061 Spinal stenosis, lumbar region without neurogenic claudication: Secondary | ICD-10-CM

## 2010-03-08 ENCOUNTER — Encounter: Payer: Self-pay | Admitting: Internal Medicine

## 2010-03-08 ENCOUNTER — Telehealth: Payer: Self-pay | Admitting: Internal Medicine

## 2010-03-08 ENCOUNTER — Ambulatory Visit (INDEPENDENT_AMBULATORY_CARE_PROVIDER_SITE_OTHER): Payer: Medicare Other | Admitting: Internal Medicine

## 2010-03-08 DIAGNOSIS — I10 Essential (primary) hypertension: Secondary | ICD-10-CM

## 2010-03-08 DIAGNOSIS — R413 Other amnesia: Secondary | ICD-10-CM

## 2010-03-08 DIAGNOSIS — E785 Hyperlipidemia, unspecified: Secondary | ICD-10-CM | POA: Insufficient documentation

## 2010-03-08 DIAGNOSIS — E559 Vitamin D deficiency, unspecified: Secondary | ICD-10-CM

## 2010-03-08 DIAGNOSIS — G894 Chronic pain syndrome: Secondary | ICD-10-CM

## 2010-03-08 DIAGNOSIS — F329 Major depressive disorder, single episode, unspecified: Secondary | ICD-10-CM

## 2010-03-08 LAB — LIPID PANEL
HDL: 74.8 mg/dL (ref >39.00)
Triglycerides: 78 mg/dL (ref 0.0–149.0)

## 2010-03-08 LAB — LDL CHOLESTEROL, DIRECT: Direct LDL: 124.9 mg/dL

## 2010-03-08 MED ORDER — GABAPENTIN 300 MG PO CAPS: ORAL_CAPSULE | ORAL | Status: DC

## 2010-03-08 NOTE — Assessment & Plan Note (Signed)
Well controlled on current meds Will continue the same.

## 2010-03-08 NOTE — Assessment & Plan Note (Signed)
No meds and bp well controlled

## 2010-03-08 NOTE — Progress Notes (Signed)
  Subjective:    Patient ID: [name] [name], female    DOB: Feb 14, 1930, 77 y.o.   MRN: [id number]  HPI  Multiple complaints: Back pain---has had some relief with recent steroid injections. Dr. Karin Golden  Depression: doing reasonably well with current meds  Chronic pain syndrome: she is doing well with current meds outside of back pain  Htn: no current meds.  Gait unsteadiness: she has seen ortho, refuses PT and uses a cane.     Review of Systems  patient denies chest pain, shortness of breath, orthopnea. Denies lower extremity edema, abdominal pain, change in appetite, change in bowel movements. Patient denies rashes, musculoskeletal complaints. No other specific complaints in a complete review of systems.  DTR present with patient---some concern with memory. She apparently forgets some details but nothing drastic.      Objective:   Physical Exam  Well-developed well-nourished female in no acute distress. HEENT exam atraumatic, normocephalic,. Neck is supple. No jugular venous distention no thyromegaly. Chest clear to auscultation without increased work of breathing. Cardiac exam S1 and S2 are regular. Abdominal exam active bowel sounds, soft, nontender. Extremities no edema. Neurologic exam she is alert without any motor sensory deficits. Gait using a cane       Assessment & Plan:

## 2010-03-08 NOTE — Assessment & Plan Note (Signed)
Needs follow-up

## 2010-03-08 NOTE — Telephone Encounter (Signed)
Pt req refill of Tramadol HCL 50mg . Pt forgot to ask the Dr for refill when she was in for ov.   Pls call in to [facility]

## 2010-03-08 NOTE — Telephone Encounter (Signed)
Dr Cato Mulligan told daughter when pt was here to try the neurotin.  Pt is aware.  She will call back if its not working

## 2010-03-08 NOTE — Assessment & Plan Note (Signed)
Will check today but unlikely that she will need treatment.

## 2010-03-08 NOTE — Assessment & Plan Note (Signed)
She is doing well on current dose of gabapentin

## 2010-03-09 LAB — VITAMIN D 25 HYDROXY (VIT D DEFICIENCY, FRACTURES): Vit D, 25-Hydroxy: 16 ng/mL — ABNORMAL LOW (ref 30–89)

## 2010-03-10 MED ORDER — ERGOCALCIFEROL 1.25 MG (50000 UT) PO CAPS: 50000.0000 [IU] | ORAL_CAPSULE | ORAL | Status: DC

## 2010-03-10 NOTE — Progress Notes (Signed)
Addended byAlfred Levins on: 03/10/2010 01:14 PM   Modules accepted: Orders

## 2010-03-18 ENCOUNTER — Telehealth: Payer: Self-pay | Admitting: *Deleted

## 2010-03-18 NOTE — Telephone Encounter (Signed)
Pt is still having back pain and wants something called into [facility].  Tylenol is not helping

## 2010-03-29 ENCOUNTER — Telehealth: Payer: Self-pay | Admitting: *Deleted

## 2010-03-29 DIAGNOSIS — M549 Dorsalgia, unspecified: Secondary | ICD-10-CM

## 2010-03-29 MED ORDER — TRAMADOL HCL 50 MG PO TABS: 50.0000 mg | ORAL_TABLET | Freq: Four times a day (QID) | ORAL | Status: AC | PRN

## 2010-03-29 NOTE — Telephone Encounter (Signed)
Could try tramadol 50 mg po qd prn back pain #10/0 refills

## 2010-03-29 NOTE — Telephone Encounter (Signed)
rx called in, pt aware 

## 2010-03-29 NOTE — Telephone Encounter (Signed)
Pt would like a rx for Hydrocodone for her back pain, tylenol is not helping.  [employer]

## 2010-04-07 ENCOUNTER — Other Ambulatory Visit: Payer: Self-pay | Admitting: Orthopedic Surgery

## 2010-04-07 ENCOUNTER — Ambulatory Visit
Admission: RE | Admit: 2010-04-07 | Discharge: 2010-04-07 | Disposition: A | Discharge status: Home / Self Care | Payer: Medicare Other | Source: Ambulatory Visit | Attending: Orthopedic Surgery | Admitting: Orthopedic Surgery

## 2010-04-07 DIAGNOSIS — M549 Dorsalgia, unspecified: Secondary | ICD-10-CM

## 2010-04-07 LAB — POCT HEMOGLOBIN-HEMACUE: Hemoglobin: 13.6 g/dL (ref 12.0–15.0)

## 2010-04-16 ENCOUNTER — Telehealth: Payer: Self-pay | Admitting: *Deleted

## 2010-04-16 NOTE — Telephone Encounter (Signed)
Follow it over the weekend call next week if sxs persist

## 2010-04-16 NOTE — Telephone Encounter (Signed)
Notified pt. 

## 2010-04-16 NOTE — Telephone Encounter (Signed)
Pt is having swelling ankles x 2 days with no pain.

## 2010-05-03 ENCOUNTER — Ambulatory Visit (INDEPENDENT_AMBULATORY_CARE_PROVIDER_SITE_OTHER): Payer: Medicare Other | Admitting: Internal Medicine

## 2010-05-03 ENCOUNTER — Encounter: Payer: Self-pay | Admitting: Internal Medicine

## 2010-05-03 DIAGNOSIS — I10 Essential (primary) hypertension: Secondary | ICD-10-CM

## 2010-05-03 DIAGNOSIS — G894 Chronic pain syndrome: Secondary | ICD-10-CM

## 2010-05-03 DIAGNOSIS — M810 Age-related osteoporosis without current pathological fracture: Secondary | ICD-10-CM

## 2010-05-03 MED ORDER — ONDANSETRON HCL 4 MG PO TABS: 4.0000 mg | ORAL_TABLET | Freq: Two times a day (BID) | ORAL | Status: DC | PRN

## 2010-05-03 NOTE — Progress Notes (Signed)
  Subjective:    Patient ID: [name] [name], female    DOB: 1930-04-01, 77 y.o.   MRN: [id number]  HPI New complaint of nausea: 4-5 days- no unusual foods or medications. BMs are ok  htn---currently on no meds Osteoporosis---on no meds currently---has completed two years of forteo  Chronic pain---doing reasonably well on current meds Depression---tolerating meds and doing reasonably well  Past Medical History  Diagnosis Date  . Vertebral fracture   . Depression   . Hyperlipidemia   . Hypertension   . Osteoporosis   . OSA (obstructive sleep apnea)   . PMR (polymyalgia rheumatica)   . Postherpetic neuralgia   . Diverticulosis of colon   . History of alcohol abuse    Past Surgical History  Procedure Date  . Rotator cuff repair   . Breast biopsy   . Vertebroplasty     x 3  . Appendectomy   . Cataract extraction   . Knee arthroscopy 10/2007 and 10/2010    left knee,   . Dilation and curettage of uterus     x several    reports that she has never smoked. She does not have any smokeless tobacco history on file. She reports that she does not drink alcohol. Her drug history not on file. family history includes Alcohol abuse in her mother; Breast cancer in her sister; Coronary artery disease (age of onset:40) in her father; Heart attack in her father; Heart disease in her sister; and Stroke (age of onset:76) in her father. Allergies  Allergen Reactions  . Celecoxib     REACTION: unspecified  . Clindamycin Hcl     REACTION: diarrhea  . Rofecoxib     REACTION: unspecified  . Sertraline Hcl     REACTION: unspecified     Review of Systems  patient denies chest pain, shortness of breath, orthopnea. Denies lower extremity edema, abdominal pain, change in appetite, change in bowel movements. Patient denies rashes, musculoskeletal complaints. No other specific complaints in a complete review of systems.      Objective:   Physical Exam  Well-developed well-nourished female in no  acute distress. HEENT exam atraumatic, normocephalic, extraocular muscles are intact. Neck is supple. No jugular venous distention no thyromegaly. Chest clear to auscultation without increased work of breathing. Cardiac exam S1 and S2 are regular. Abdominal exam active bowel sounds, soft, nontender. Extremities no edema. Neurologic exam she is alert without any motor sensory deficits. Gait is normal.        Assessment & Plan:

## 2010-05-03 NOTE — Assessment & Plan Note (Signed)
She has completed 2 years of forteo BUT--she has new compression fractures- Consider prolia---will clear with insurance.

## 2010-05-03 NOTE — Assessment & Plan Note (Signed)
Currently on no meds BP ok

## 2010-05-03 NOTE — Assessment & Plan Note (Signed)
She is doing remarkably well from pain point of view

## 2010-05-17 ENCOUNTER — Other Ambulatory Visit: Payer: Self-pay | Admitting: Internal Medicine

## 2010-06-02 ENCOUNTER — Telehealth: Payer: Self-pay | Admitting: Internal Medicine

## 2010-06-02 NOTE — Telephone Encounter (Signed)
Chart opened in error

## 2010-06-08 NOTE — Op Note (Signed)
NAMEEUSTACIA, [name] [name]                ACCOUNT NO.:  [id number]   MEDICAL RECORD NO.:  [id number]          PATIENT TYPE:  AMB   LOCATION:  DSC                          FACILITY:  [facility]   PHYSICIAN:  Robert A. Thurston Hole, M.D. DATE OF BIRTH:  01-08-1931   DATE OF PROCEDURE:  11/12/2007  DATE OF DISCHARGE:                               OPERATIVE REPORT   PREOPERATIVE DIAGNOSES:  1. Left knee medial and lateral meniscal tears.  2. Left knee chondromalacia and synovitis.   POSTOPERATIVE DIAGNOSES:  1. Left knee medial lateral meniscal tears.  2. Left knee chondromalacia and synovitis.   PROCEDURE:  1. Left knee examination under anesthesia followed by arthroscopic      partial medial and lateral meniscectomies.  2. Left knee chondroplasty with partial synovectomy.   SURGEON:  Elana Alm. Thurston Hole, MD.   ASSISTANT:  None.   ANESTHESIA:  Local and MAC.   OPERATIVE TIME:  30 minutes.   COMPLICATIONS:  None.   INDICATIONS FOR PROCEDURE:  [name] is a 77 year old woman who has had  significant left knee pain for the past 3-4 months, increasing in nature  with exam and MRI documenting meniscal tearing with chondromalacia and  synovitis.  She has failed conservative care and is now to undergo  arthroscopy.   DESCRIPTION:  [name] was brought to the operating room on November 12, 2007, after a knee block was placed in holding area by anesthesia.  She  was placed on operative table in supine position.  Her left knee was  examined.  She had full range of motion of knee with stable ligamentous  exam with normal patellar tracking.  The left leg was prepped using  sterile DuraPrep and draped using sterile technique.  Originally,  through an anterolateral portal, the arthroscope with a pump attached  was placed and through an anteromedial portal, an arthroscopic probe was  placed.  On initial inspection of the medial compartment, she had 25%  grade 3 and the rest grade 1 and 2 chondromalacia,  which was debrided.  She had tearing in the posterior and medial horn and medial meniscus, of  which 40-50% was resected back to stable rim.  Intercondylar notch  inspected.  Anterior and posterior cruciate ligaments were normal.  Lateral compartment was inspected.  A 20% grade 3 and the rest grade 1  and 2 chondromalacia, which was debrided.  Lateral meniscus tearing of  posterior and lateral and anterior horn of which 30-40% was resected  back to a stable rim.  Patellofemoral joint showed 20% grade 3 and the  rest grade 1 and 2 chondromalacia and this was debrided.  The patella  tracked normally.  Moderate synovitis and mediolateral gutters were  debrided; otherwise, were free of pathology.  At this point, it was felt  that all pathology have been satisfactorily addressed.  The instruments  were removed.  The portals were closed with 3-0 nylon suture and  injected with 0.25% Marcaine with epinephrine and 4 mg of morphine.  Sterile dressings were applied, and the patient awakened and taken to  the recovery room  in stable condition.   FOLLOWUP CARE:  [name] will be followed as an outpatient on OxyContin  for pain.  She will be seen back in the office in a week for sutures out  and followup.      Robert A. Thurston Hole, M.D.  Electronically Signed     RAW/MEDQ  D:  11/12/2007  T:  11/12/2007  Job:  [id number]

## 2010-06-08 NOTE — Assessment & Plan Note (Signed)
[facility]                                 ON-CALL NOTE   NAME:CLAPPAzha, Constantin                         MRN:          [id number]  DATE:07/28/2006                            DOB:          12-May-1930    A patient of Dr. Cato Mulligan.   Patient calling because her blood pressure is 110/70.  She is not  symptomatic.  She is not lightheaded when she stands up, etc.  __________ hypertension.  She takes HCTZ 25 mg q.a.m.  She is otherwise  well.  Advised her to hold her HCTZ on the 4th, 5th and 6th, check her  blood pressures 3 times a day.  Call the office on Monday, get an  appointment to see Dr. Cato Mulligan, bring all blood pressure readings and the  device she is using, to be sure it is calibrated right.     Jeffrey A. Tawanna Cooler, MD  Electronically Signed    JAT/MedQ  DD: 07/29/2006  DT: 07/31/2006  Job #: [id number]

## 2010-06-08 NOTE — Assessment & Plan Note (Signed)
[facility]                             PULMONARY OFFICE NOTE   NAME:[name], [name]                       MRN:          [id number]  DATE:11/16/2006                            DOB:          [birth year]    SUBJECTIVE:  [name] [name] comes in today for followup of her obstructive  sleep apnea.  She has been diagnosed with moderate disease with an apnea-  hypopnea index of 23 events per hour.  The patient has been tried on  CPAP and was absolutely miserable during this time.  The last time I saw  her was in 2006 and she elected to just try and work aggressively on  weight loss.  Unfortunately, the patient has gained 20 pounds since that  last visit.  She is here today because she had a family member recently  in the hospital and was scared to death by a nurse that was talking to  her about sleep apnea.  She comes in to discuss a more aggressive  treatment.  The patient states that she is sleeping very well and has  absolutely no daytime sleepiness with periods of inactivity.  She feels  that she is rested and that her sleeping is not an issue.  She does not  disturb her husband's sleep since she does so in a different bedroom.  The patient does have recently elevated cholesterol and has some mild  hypertension but has no known cardiac disease, she does not have  diabetes.   PHYSICAL EXAM:  She is a well-developed female in no acute distress.  Blood pressure is 126/80, pulse is 70, temperature is 98.6, weight is  170 pounds, O2 saturation on room air is 97%.   IMPRESSION:  History of moderate obstructive sleep apnea.  The patient  continues to be asymptomatic in terms of alertness and sleepiness.  She  is concerned, however, about the long term cardiovascular effects.  I  have had a very frank discussion with her today and have told her that  she does have a slight increase in cardiovascular risk but overall it is  not excessive.  Patient does not have known  coronary disease, and is not  morbidly obese.  I am more than happy to give her another trial of  continuous positive airway pressure; however, she was totally miserable  with the last trial and she is being honest that she is really not  looking forward to it.  I have also discussed with her the possibility  of oral appliance.  Again, I have explained to the patient that her  cardiovascular risk is not excessive and that weight loss would  definitely be in her best interest for all of her medical conditions.  Patient would really like to make a better attempt at weight loss and is  even interested in joining [employer].   PLAN:  1. Work aggressively on weight loss.  2. I told the patient to be very cognizant of her quality of sleep and      also her alertness level during the day.  Certainly, if any  of her      symptoms start to worsen I would be more aggressive with oral      appliance or CPAP.  I have also reminded her that as her weight      goes up her sleep apnea can get worse.  At this point in time after      speaking with her, I really do      not think that CPAP will be successful and that working on weight      loss aggressively would probably be her best mode of treatment.      The patient is agreeable to this.     Barbaraann Share, MD,FCCP  Electronically Signed    KMC/MedQ  DD: 11/16/2006  DT: 11/16/2006  Job #: [id number]   cc:   Valetta Mole. Swords, MD

## 2010-06-11 NOTE — Procedures (Signed)
NAMERAYEN, PALEN NO.:  [id number]   MEDICAL RECORD NO.:  [id number]          PATIENT TYPE:  OUT   LOCATION:  SLEEP CENTER                 FACILITY:  [facility]   PHYSICIAN:  Marcelyn Bruins, M.D. [facility] DATE OF BIRTH:  06/13/1930   DATE OF STUDY:  10/06/2004                              NOCTURNAL POLYSOMNOGRAM   REFERRING PHYSICIAN:  Dr. Birdie Sons   DATE OF STUDY:  October 06, 2004   INDICATION FOR THE STUDY:  Hypersomnia with sleep apnea.   EPWORTH SCORE:  4   SLEEP ARCHITECTURE:  The patient had a total sleep time of 354 minutes with  adequate slow wave sleep but she never achieved REM. Sleep onset latency was  fairly rapid at 5 minutes. Sleep efficiency was 78%.   RESPIRATORY DATA:  The patient underwent split night study where she was  found to have 55 obstructive events in the first 146 minutes of sleep. This  gave her respiratory disturbance index of 23 events per hour. Events were a  little worse in the supine position and were associated with moderate to  loud snoring. By split night protocol the patient was then placed on a  medium Respironics Full Face mask and ultimately titrated to a final  pressure of 11 cm with excellent control of obstructive events.   OXYGEN DATA:  The patient had O2 desaturation as low as 78% associated with  obstructive events.   CARDIAC DATA:  The patient had a normal sinus rhythm throughout the study  with no clinically significant cardiac arrhythmias.   MOVEMENT/PARASOMNIA:  The patient was found to have 245 leg jerks with 16  per hour resulting in arousal or awakening. They did not seem to be improved  by the addition of CPAP therapy.   IMPRESSION/RECOMMENDATIONS:  1.  Moderate obstructive sleep apnea which appears to be well controlled      with 11 cm of CPAP.  2.  Large numbers of leg jerks with what appears to be significant sleep      disruption. If the patient has a history consistent with the restless  leg syndrome, then I would consider therapy. If she does not, then I      would treat the      patient with CPAP and see how she responds clinically. If she continues      to have daytime symptoms despite compliant use of CPAP, I would consider      adding treatment for her periodic leg movements.           ______________________________  Marcelyn Bruins, M.D. [facility]  Diplomate, American Board of Sleep  Medicine     KC/MEDQ  D:  10/14/2004 08:47:59  T:  10/14/2004 14:46:05  Job:  [id number]

## 2010-06-11 NOTE — Op Note (Signed)
[place]. [facility]  Patient:    [name], [name]                         MRN: [id number] Adm. Date:  09/06/99 Attending:  Elana Alm. Thurston Hole, M.D.                           Operative Report  PREOPERATIVE DIAGNOSIS:  Right shoulder rotator cuff tear.  POSTOPERATIVE DIAGNOSES: 1. Right shoulder rotator cuff tear. 2. Right shoulder anterior glenoid labrum tear. 3. Right shoulder impingement.  OPERATION: 1. Right shoulder examination under anesthesia, followed by arthroscopic    partial labrum tear debridement and decompression. 2. Right shoulder open rotator cuff repair.  SURGEON:  Elana Alm. Thurston Hole, M.D.  ASSISTANT:  Kirstin Adelberger, P.A.  ANESTHESIA:  General.  OPERATIVE TIME:  45 minutes.  COMPLICATIONS:  None.  INDICATION FOR PROCEDURE:  [name] [name] is a 77 year old woman who has had one to two years of increasing right shoulder pain with signs and symptoms and an MRI documenting a complete rotator cuff tear.  She has failed conservative care and is now to undergo arthroscopy and rotator cuff repair.  DESCRIPTION OF PROCEDURE:  [name] [name] is brought to the operating room on September 06, 1999 and placed on the operative table in the supine position. After and adequate level of general anesthesia was obtained, her right shoulder was examined under anesthesia. She had full range of motion in her shoulder with stable ligamentous exam.  She was then placed in a beach chair position and the shoulder and arm was prepped using sterile Betadine and draped using sterile technique.  She received Ancef 1 gram IV for prophylaxis. Initially the arthroscopy was performed through a posterior arthroscopic portal.  The arthroscope with the pump attachment was placed and through an anterior portal an arthroscopic probe was placed. On initial inspection, the articular cartilage and the glenohumeral joint was found to be intact.  The anterior labrum superiorly was  torn 25 to 30% which was debrided.  The biceps tendon anchor was intact.  The biceps tendon was intact.  The middle and inferior glenoid labrum anteriorly was intact as well as the anterior inferior glenohumeral ligaments.  The posterior labrum was intact.  The rotator cuff showed a complete tear of the supraspinatus which was repairable.  The rest of the rotator cuff was found to be intact as well as the inferior capsular recess.  A lateral arthroscopic portal was made and the subacromial space was entered.  Moderately thickened bursitis was resected.  The rotator cuff tear was partially debrided arthroscopically and a subacromial decompression was carried out removing 6 to 8 mm of the undersurface of the anterior, anterior medial and anterior lateral acromion and CL ligament release carried out. After this was done, then through the lateral portal, a deltoid splitting 3 cm incision was made.  The underlying subcutaneous tissues were incised in line with the skin incision.  The subacromial space was entered.  The rotator cuff tear was easily visualized and sharply debrided.  It was primarily repaired both tendon to tendon medially with #2 Panacryl suture as well as using an Arthrotrex suture anchor with #2 Tycron suture through this and two separate sutures placed in the rotator cuff tying it down to the anchor securing it firmly to the greater tuberosity.  After this was done, the shoulder could be brought through a full range of motion  with no impingement on the repair.  The wound was irrigated and closed using 0 Vicryl to close the deltoid fascia. Subcutaneous tissues closed with 2-0 Vicryl.  Subcuticular layer closed with 3-0 Prolene.  Steri-Strips were applied.  The wound was injected with 0.25% Marcaine with epinephrine.  Sterile dressings and a sling applied.  The patient was awakened and taken to the recovery room in a stable condition.  FOLLOW UP CARE:  [name] [name] will be followed  overnight at the [facility] for IV pain control and neurovascular monitoring.  Discharge tomorrow on Percocet for pain.  See back in the office in a week for sutures out and follow up.  Begin early physical therapy for range of motion, followed by strengthening. DD:  09/06/99 TD:  09/06/99 Job: [id number]

## 2010-06-11 NOTE — Op Note (Signed)
[name] [name], DIBENEDETTO                ACCOUNT NO.:  [id number]   MEDICAL RECORD NO.:  [id number]            PATIENT TYPE:   LOCATION:                                 FACILITY:   PHYSICIAN:  Cindee Salt, M.D.            DATE OF BIRTH:   DATE OF PROCEDURE:  01/19/2005  DATE OF DISCHARGE:                                 OPERATIVE REPORT   PREOPERATIVE DIAGNOSIS:  Mucoid cyst, left index finger distal phalangeal  joint.   POSTOPERATIVE DIAGNOSIS:  Mucoid cyst, left index finger distal phalangeal  joint.   OPERATION:  Excision mucoid tumor debridement distal interphalangeal joint  left index finger.   SURGEON:  Cindee Salt, M.D.   ASSISTANT:  Carolyne Fiscal R.N.   ANESTHESIA:  Forearm based IV regional.   HISTORY:  The patient is a 77 year old female with a history of a large cyst  on the dorsal aspect just distal to distal phalanx of her left index finger.  She has obvious deformity of the finger tip with deviation. She has  significant deformity of her nail bed and nail plate.   DESCRIPTION OF PROCEDURE:  The patient was brought to the operating room  where a forearm based IV regional anesthetic was carried out without  difficulty. She was prepped using DuraPrep in the supine position, left arm  free. After adequate anesthesia was afforded curvilinear incision was made  over the distal interphalangeal joint over the mass and carried down through  subcutaneous tissue. Bleeders were electrocauterized. Dissection carried  distally. A large multilobulated cyst was immediately apparent distal to the  insertion of the extensor tendon.  With blunt and sharp dissection this was  dissected free taking care to protect the dorsal nail fold. The cyst was  sent to pathology. The joint was then opened on its radial aspect. A  debridement was then performed of the distal interphalangeal joint. No  further lesions were identified. The wound was irrigated. The skin was then  closed with interrupted 5-0 nylon  sutures. Sterile compressive dressing and  splint to the finger was applied. The patient tolerated the procedure well;  and was taken to the recovery room for observation in satisfactory  condition. She is discharged home to return to the [facility]  in 1 week on Vicodin.           ______________________________  Cindee Salt, M.D.     GK/MEDQ  D:  01/19/2005  T:  01/19/2005  Job:  [id number]

## 2010-06-11 NOTE — Op Note (Signed)
[place]. [facility]  Patient:    [name] [name], [name] [name]                       MRN: [id number] Proc. Date: 04/30/99 Adm. Date:  [id number] Attending:  Bonnetta Barry CC:         Stacie Glaze, M.D.                           Operative Report  PREOPERATIVE DIAGNOSIS:  Abnormal mammogram.  POSTOPERATIVE DIAGNOSIS:  Abnormal mammogram.  PROCEDURE:  Left breast excisional biopsy with wire localization.  SURGEON:  Velora Heckler, M.D.  ANESTHESIA:  Local with intravenous sedation.  PREPARATION:  Betadine.  COMPLICATIONS:  None.  BLOOD LOSS:  Minimal.  INDICATIONS:  The patient is a 77 year old white female who presents with abnormal mammogram.  Study was done at [facility] on March 19.  This showed n indeterminate cluster of microcalcifications in the upper outer quadrant of the  left breast.  The patient now comes for biopsy to rule out malignancy.  DESCRIPTION OF PROCEDURE:  The procedure was done in the OR #6 at the [facility].  The patient was brought to the operating room and placed in the supine position on the operating room table.  Following the administration of intravenous sedation, the patient was prepped and draped in the usual strict aseptic fashion.  After ascertaining that an adequate level of sedation had been obtained, the skin at the site of the guidewire was anesthetized with local anesthetic.  A curvilinear incision was made with a #15 blade.  The wire was freed from the skin and dissection was carried into the subcutaneous tissues. Hemostasis was obtained with the electrocautery.  Using sharp dissection, a core of breast  tissue was excised around the guidewire.  Dissection was carried down and around the tip of the guidewire.  The tissue was submitted for specimen mammography, and Dr. Anselmo Pickler confirms that the lesion in question is present within the specimen.  This will be submitted to  pathology for review.  Hemostasis was obtained throughout the wound with the electrocautery.  The skin  edges were reapproximated with interrupted 4-0 Vicryl subcuticular sutures. The wound was washed and dried, and Benzoin and Steri-Strips were applied.  Sterile  gauze dressings were applied.  The patient was transferred to the recovery room in stable condition.  The patient tolerated the procedure well. DD:  04/30/99 TD:  04/30/99 Job: [id number]

## 2010-07-02 ENCOUNTER — Encounter: Payer: Self-pay | Admitting: Internal Medicine

## 2010-07-02 ENCOUNTER — Ambulatory Visit (INDEPENDENT_AMBULATORY_CARE_PROVIDER_SITE_OTHER): Payer: Medicare Other | Admitting: Internal Medicine

## 2010-07-02 DIAGNOSIS — M810 Age-related osteoporosis without current pathological fracture: Secondary | ICD-10-CM

## 2010-07-02 DIAGNOSIS — E785 Hyperlipidemia, unspecified: Secondary | ICD-10-CM

## 2010-07-02 DIAGNOSIS — IMO0002 Reserved for concepts with insufficient information to code with codable children: Secondary | ICD-10-CM

## 2010-07-02 DIAGNOSIS — R413 Other amnesia: Secondary | ICD-10-CM | POA: Insufficient documentation

## 2010-07-02 DIAGNOSIS — I10 Essential (primary) hypertension: Secondary | ICD-10-CM

## 2010-07-02 DIAGNOSIS — M1710 Unilateral primary osteoarthritis, unspecified knee: Secondary | ICD-10-CM | POA: Insufficient documentation

## 2010-07-02 LAB — TSH: TSH: 4.05 u[IU]/mL (ref 0.35–5.50)

## 2010-07-02 LAB — VITAMIN B12: Vitamin B-12: 244 pg/mL (ref 211–911)

## 2010-07-02 MED ORDER — DENOSUMAB 60 MG/ML ~~LOC~~ SOLN: 60.0000 mg | Freq: Once | SUBCUTANEOUS | Status: DC

## 2010-07-02 NOTE — Assessment & Plan Note (Signed)
Subjective complaint  Check labs

## 2010-07-02 NOTE — Assessment & Plan Note (Signed)
End stage OA right knee Considering surgery  She is cleared for surgery She and husband understand the risks of surgery. I suspect she is at average risk for her age. Check EKG

## 2010-07-02 NOTE — Assessment & Plan Note (Signed)
Controlled Continue off of all antihypertensive meds

## 2010-07-02 NOTE — Progress Notes (Signed)
  Subjective:    Patient ID: [name] [name], female    DOB: 05-25-1930, 77 y.o.   MRN: [id number]  HPI  Pt is here for preop clearance for TKA---right. Dr. Thurston Hole. She is feeling well. No cardiopulmonary complaints  htn---no meds  OA- right knee, pain with ambulation  Osteoporosis---start prolia today  Alcohol---drinks two-three glasses of wine daily.   Husband concerned with memory loss---says she asks repetitive questions---according to husband  Past Medical History  Diagnosis Date  . Vertebral fracture   . Depression   . Hyperlipidemia   . Hypertension   . Osteoporosis   . OSA (obstructive sleep apnea)   . PMR (polymyalgia rheumatica)   . Postherpetic neuralgia   . Diverticulosis of colon   . History of alcohol abuse    Past Surgical History  Procedure Date  . Rotator cuff repair   . Breast biopsy   . Vertebroplasty     x 3  . Appendectomy   . Cataract extraction   . Knee arthroscopy 10/2007 and 10/2010    left knee,   . Dilation and curettage of uterus     x several    reports that she quit smoking about 22 years ago. She does not have any smokeless tobacco history on file. She reports that she drinks alcohol. She reports that she does not use illicit drugs. family history includes Alcohol abuse in her mother; Breast cancer in her sister; Coronary artery disease (age of onset:40) in her father; Heart attack in her father; Heart disease in her sister; and Stroke (age of onset:76) in her father. Allergies  Allergen Reactions  . Celecoxib     REACTION: unspecified  . Clindamycin Hcl     REACTION: diarrhea  . Rofecoxib     REACTION: unspecified  . Sertraline Hcl     REACTION: unspecified     Review of Systems  patient denies chest pain, shortness of breath, orthopnea. Denies lower extremity edema, abdominal pain, change in appetite, change in bowel movements. Patient denies rashes, musculoskeletal complaints. No other specific complaints in a complete review  of systems.      Objective:   Physical Exam  Well-developed well-nourished female in no acute distress. HEENT exam atraumatic, normocephalic, extraocular muscles are intact. Neck is supple. No jugular venous distention no thyromegaly. Chest clear to auscultation without increased work of breathing. Cardiac exam S1 and S2 are regular. Abdominal exam active bowel sounds, soft, nontender. Extremities no edema. Neurologic exam she is alert without any motor sensory deficits. Gait she is walking with a walker        Assessment & Plan:

## 2010-07-02 NOTE — Progress Notes (Signed)
Addended by: [name] D on: 07/02/2010 10:22 AM   Modules accepted: Orders

## 2010-07-02 NOTE — Assessment & Plan Note (Signed)
Note elevated HDL

## 2010-07-05 ENCOUNTER — Encounter: Payer: Self-pay | Admitting: *Deleted

## 2010-07-09 ENCOUNTER — Telehealth: Payer: Self-pay | Admitting: Internal Medicine

## 2010-07-09 DIAGNOSIS — R413 Other amnesia: Secondary | ICD-10-CM

## 2010-07-09 NOTE — Telephone Encounter (Signed)
pts daughter wants to know what to do about her memory since all her labs were normal.  I suggested maybe Aricept?

## 2010-07-09 NOTE — Telephone Encounter (Signed)
Pts daughter called and is req lab results and what pt needs to do next.

## 2010-07-12 NOTE — Telephone Encounter (Signed)
Now pts daughter wants to schedule an OV either with Dr Cato Mulligan or a neurologist for althziemers testing.  She discussed it with her siblings.

## 2010-07-12 NOTE — Telephone Encounter (Signed)
It would be fine with me for her to start Aricept 5 mg by mouth daily.

## 2010-07-13 NOTE — Telephone Encounter (Signed)
Labs were ok Ok to refer to neurology---memory loss

## 2010-07-14 NOTE — Telephone Encounter (Signed)
Pt's daughter Moises Blood called to check the status of message she left Monday.  Advised daughter of Dr. Cato Mulligan instructions and referral process.  Order entered.

## 2010-08-05 ENCOUNTER — Telehealth: Payer: Self-pay | Admitting: Internal Medicine

## 2010-08-05 NOTE — Telephone Encounter (Signed)
Patients daughter [name] [name]) is concerned about mother [name] [name], she was referred to neurologist by Doc Swords and refused to go. [name] [name] is scheduled for an upcoming knee replacement, [name] [name] is concerned that the trama of the knee replacement will make her mothers demansia worse and would like guidance on what the best route for her mother would be.

## 2010-08-10 NOTE — Telephone Encounter (Signed)
Pt has a knee replacement scheduled on 09/13/10.  Pt refuses to see the neurologist.  Daughter is upset and does not know what to do.  She did research of dementia on the internet.  Would like Dr Cato Mulligan advice.  She is aware that Dr Cato Mulligan is out of the office

## 2010-08-16 NOTE — Telephone Encounter (Signed)
I would recommend that the patient followup with neurology. I don't think it is critical to do that before knee replacement. However, it would be best

## 2010-08-17 NOTE — Telephone Encounter (Signed)
Pt's daughter aware.  Pt is not responding well to daughter telling her to see neurologist.  She is going to tell pt to f/u with Dr Cato Mulligan after her knee replacement and have Dr Cato Mulligan encourage her to go.  Pts daughter feels pt will go if it came from Dr Cato Mulligan.

## 2010-08-23 ENCOUNTER — Other Ambulatory Visit: Payer: Self-pay | Admitting: Internal Medicine

## 2010-08-25 HISTORY — PX: REPLACEMENT TOTAL KNEE: SUR1224

## 2010-09-07 ENCOUNTER — Encounter (HOSPITAL_COMMUNITY)
Admission: RE | Admit: 2010-09-07 | Discharge: 2010-09-07 | Disposition: A | Discharge status: Home / Self Care | Payer: Medicare Other | Source: Ambulatory Visit | Attending: Orthopedic Surgery | Admitting: Orthopedic Surgery

## 2010-09-07 LAB — URINE MICROSCOPIC-ADD ON

## 2010-09-07 LAB — COMPREHENSIVE METABOLIC PANEL
AST: 19 U/L (ref 0–37)
Albumin: 3.7 g/dL (ref 3.5–5.2)
BUN: 13 mg/dL (ref 6–23)
Creatinine, Ser: 0.55 mg/dL (ref 0.50–1.10)
Potassium: 4.4 mEq/L (ref 3.5–5.1)
Total Protein: 7.7 g/dL (ref 6.0–8.3)

## 2010-09-07 LAB — CBC
MCV: 97.2 fL (ref 78.0–100.0)
Platelets: 201 10*3/uL (ref 150–400)
RBC: 3.88 MIL/uL (ref 3.87–5.11)
RDW: 14.6 % (ref 11.5–15.5)
WBC: 8.8 10*3/uL (ref 4.0–10.5)

## 2010-09-07 LAB — URINALYSIS, ROUTINE W REFLEX MICROSCOPIC
Glucose, UA: NEGATIVE mg/dL
Ketones, ur: NEGATIVE mg/dL
Leukocytes, UA: NEGATIVE
Protein, ur: NEGATIVE mg/dL
pH: 5.5 (ref 5.0–8.0)

## 2010-09-07 LAB — DIFFERENTIAL
Basophils Absolute: 0.1 10*3/uL (ref 0.0–0.1)
Basophils Relative: 1 % (ref 0–1)
Eosinophils Absolute: 0.2 10*3/uL (ref 0.0–0.7)
Eosinophils Relative: 2 % (ref 0–5)
Neutrophils Relative %: 24 % — ABNORMAL LOW (ref 43–77)

## 2010-09-07 LAB — SURGICAL PCR SCREEN: MRSA, PCR: NEGATIVE

## 2010-09-07 LAB — TYPE AND SCREEN: ABO/RH(D): A POS

## 2010-09-07 LAB — APTT: aPTT: 28 seconds (ref 24–37)

## 2010-09-07 LAB — PROTIME-INR: Prothrombin Time: 15 seconds (ref 11.6–15.2)

## 2010-09-07 LAB — ABO/RH: ABO/RH(D): A POS

## 2010-09-08 LAB — URINE CULTURE: Culture  Setup Time: 201208141619

## 2010-09-13 ENCOUNTER — Inpatient Hospital Stay (HOSPITAL_COMMUNITY)
Admission: RE | Admit: 2010-09-13 | Discharge: 2010-09-16 | DRG: 470 | Disposition: A | Discharge status: Skilled Nursing Facility | Payer: Medicare Other | Source: Ambulatory Visit | Attending: Orthopedic Surgery | Admitting: Orthopedic Surgery

## 2010-09-13 DIAGNOSIS — F411 Generalized anxiety disorder: Secondary | ICD-10-CM | POA: Diagnosis present

## 2010-09-13 DIAGNOSIS — E871 Hypo-osmolality and hyponatremia: Secondary | ICD-10-CM | POA: Diagnosis not present

## 2010-09-13 DIAGNOSIS — M1710 Unilateral primary osteoarthritis, unspecified knee: Principal | ICD-10-CM | POA: Diagnosis present

## 2010-09-13 DIAGNOSIS — Z7982 Long term (current) use of aspirin: Secondary | ICD-10-CM

## 2010-09-13 DIAGNOSIS — D696 Thrombocytopenia, unspecified: Secondary | ICD-10-CM | POA: Diagnosis not present

## 2010-09-13 DIAGNOSIS — Z79899 Other long term (current) drug therapy: Secondary | ICD-10-CM

## 2010-09-13 DIAGNOSIS — G609 Hereditary and idiopathic neuropathy, unspecified: Secondary | ICD-10-CM | POA: Diagnosis present

## 2010-09-13 DIAGNOSIS — I10 Essential (primary) hypertension: Secondary | ICD-10-CM | POA: Diagnosis present

## 2010-09-13 DIAGNOSIS — K589 Irritable bowel syndrome without diarrhea: Secondary | ICD-10-CM | POA: Diagnosis present

## 2010-09-13 DIAGNOSIS — D62 Acute posthemorrhagic anemia: Secondary | ICD-10-CM | POA: Diagnosis not present

## 2010-09-13 DIAGNOSIS — Z01812 Encounter for preprocedural laboratory examination: Secondary | ICD-10-CM

## 2010-09-13 DIAGNOSIS — G4733 Obstructive sleep apnea (adult) (pediatric): Secondary | ICD-10-CM | POA: Diagnosis present

## 2010-09-14 LAB — BASIC METABOLIC PANEL
BUN: 7 mg/dL (ref 6–23)
Calcium: 7.8 mg/dL — ABNORMAL LOW (ref 8.4–10.5)
Creatinine, Ser: 0.62 mg/dL (ref 0.50–1.10)
GFR calc non Af Amer: >60 mL/min (ref >60)
Glucose, Bld: 102 mg/dL — ABNORMAL HIGH (ref 70–99)
Potassium: 4.2 mEq/L (ref 3.5–5.1)

## 2010-09-14 LAB — CBC
HCT: 28.2 % — ABNORMAL LOW (ref 36.0–46.0)
Hemoglobin: 9.5 g/dL — ABNORMAL LOW (ref 12.0–15.0)
MCH: 33.6 pg (ref 26.0–34.0)
MCHC: 33.7 g/dL (ref 30.0–36.0)
MCV: 99.6 fL (ref 78.0–100.0)

## 2010-09-15 LAB — CBC
Hemoglobin: 9 g/dL — ABNORMAL LOW (ref 12.0–15.0)
MCH: 33.2 pg (ref 26.0–34.0)
MCHC: 33.5 g/dL (ref 30.0–36.0)
RDW: 15 % (ref 11.5–15.5)

## 2010-09-15 LAB — BASIC METABOLIC PANEL
Calcium: 7.8 mg/dL — ABNORMAL LOW (ref 8.4–10.5)
GFR calc non Af Amer: >60 mL/min (ref >60)
Glucose, Bld: 126 mg/dL — ABNORMAL HIGH (ref 70–99)
Sodium: 132 mEq/L — ABNORMAL LOW (ref 135–145)

## 2010-09-16 LAB — CBC
MCH: 32.4 pg (ref 26.0–34.0)
MCHC: 32.7 g/dL (ref 30.0–36.0)
Platelets: 130 10*3/uL — ABNORMAL LOW (ref 150–400)
RBC: 2.78 MIL/uL — ABNORMAL LOW (ref 3.87–5.11)

## 2010-09-16 LAB — BASIC METABOLIC PANEL
CO2: 27 mEq/L (ref 19–32)
Calcium: 7.7 mg/dL — ABNORMAL LOW (ref 8.4–10.5)
GFR calc non Af Amer: >60 mL/min (ref >60)
Sodium: 134 mEq/L — ABNORMAL LOW (ref 135–145)

## 2010-09-28 NOTE — Op Note (Signed)
[name] [name], [name] NO.:  [id number]  MEDICAL RECORD NO.:  [id number]  LOCATION:  5041                         FACILITY:  [facility]  PHYSICIAN:  Nelsie Domino A. Thurston Hole, M.D. DATE OF BIRTH:  09/22/1930  DATE OF PROCEDURE:  09/13/2010 DATE OF DISCHARGE:                              OPERATIVE REPORT   PREOPERATIVE DIAGNOSIS:  Right knee degenerative joint disease.  POSTOPERATIVE DIAGNOSIS:  Right knee degenerative joint disease.  PROCEDURE:  Right total knee replacement using DePuy cemented total knee system with #4 narrow cemented femur, #4 cemented tibia with 10-mm polyethylene RP tibial spacer, and 35-mm polyethylene cemented patella. Zinacef-impregnated cement.  SURGEON:  Elana Alm. Thurston Hole, MD  ASSISTANT:  Julien Girt, PAC.  ANESTHESIA:  General.  OPERATIVE TIME:  1 hour and 30 minutes.  COMPLICATIONS:  None.  DESCRIPTION OF PROCEDURE:  Ms. [name] [name] was brought to the operating room on September 13, 2010 after a femoral nerve block was placed in holding by Anesthesia.  She was placed in operative table in supine position. After being placed under general anesthesia, her right knee was examined.  Range of motion from -5 to 125 degrees, mild valgus deformity, knee stable.  Ligamentous exam with normal patellar tracking. She received Ancef 2 g IV preoperatively for prophylaxis.  She had a Foley catheter placed under sterile conditions.  Her right knee and leg was then prepped using sterile DuraPrep and draped using sterile technique.  Time-out procedure was called and the correct right knee identified.  The right leg was exsanguinated and thigh tourniquet elevated at 365 mm.  Initially through a 15-cm longitudinal incision based over the patella, initial exposure was made.  The underlying subcutaneous tissues were incised along with skin incision.  A median arthrotomy was performed revealing an excessive amount of normal- appearing joint fluid.  The articular  surfaces were inspected.  She had grade 4 changes laterally, grade 3 and 4 changes medially in the patellofemoral joint.  Osteophytes were removed from the femoral condyles and tibial plateau.  The medial and lateral meniscal remnants were removed as well as the anterior cruciate ligament.  Intramedullary drill was then drilled up the femoral canal for placement of distal femoral cutting jig, which was placed in the appropriate manner rotation and a distal 12-mm cut was made.  The distal femur was incised.  #4 narrow was found to be the appropriate size.  #4 cutting jig was placed in appropriate manner of external rotation and then these cuts were made.  Proximal tibia was then exposed.  Tibial spines were removed with an oscillating saw.  Intramedullary drill was drilled down the tibial canal for placement of the proximal tibial cutting jig, which was placed in appropriate manner rotation and a proximal 4-mm cut was made based of the lateral or lower side.  Spacer blocks were then placed in flexion extension.  10-mm blocks gave excellent balancing, excellent stability, and excellent correction of her flexion and valgus deformities.  At this point, the #4 tibial baseplate trial was placed on the cut tibial surface with an excellent fit and the keel cut was made.  The PCL box cutter was placed on the distal femur and  these cuts were made.  At this point, the #4 narrow femoral trial was placed and with the #4 tibial baseplate trial and a 10-mm polyethylene RP tibial spacer, knee was reduced, taken through range of motion from 0-125 degrees with excellent stability and excellent correction of her flexion and valgus deformities and normal patellar tracking.  A resurfacing 8-mm cut was then made on the patella and 3 locking holes placed for a 35-mm polyethylene patellar trial.  Again, patellofemoral tracking was evaluated and found to be normal.  At this point, it was felt that all the trial  components were of excellent size, fit, and stability.  They were then removed.  The knee was then jet lavaged irrigated with 3 liters of saline.  The proximal tibia was then exposed.  #4 tibial baseplate with Zinacef- impregnated cement backing was hammered into position with an excellent fit with excess cement being removed from around the edges.  #4 narrow femoral component with cement backing was hammered into position also with an excellent fit with excess cement being removed around the edges. The 10-mm polyethylene RP tibial spacer was placed on tibial baseplate. The knee reduced, taken through range of motion from 0-125 degrees with excellent stability and excellent correction of her flexion and valgus deformities.  The 35-mm polyethylene cement-backed patella was then placed in its position and held there with a clamp.  After the cement had hardened, again patellofemoral tracking was evaluated and found to be normal.  At this point, it was felt that all the components were of excellent size and stability.  The wound was further irrigated with saline and then the tourniquet was released.  Hemostasis obtained with cautery.  The arthrotomy was then closed with #1 PDS suture. Subcutaneous tissues closed with 0 and 2-0 Vicryl, subcuticular layer closed with 4-0 Monocryl.  The wound was closed over two medium Hemovac drains.  Sterile dressings were applied and a long-leg splint and the patient awakened and taken to recovery in stable condition.  Needle and sponge counts were correct x2 at the end of the case.  Neurovascular status normal.  Pulses 2+ and symmetric.     Sloan Takagi A. Thurston Hole, M.D.     RAW/MEDQ  D:  09/13/2010  T:  09/14/2010  Job:  [id number]  Electronically Signed by Salvatore Marvel M.D. on 09/28/2010 08:27:33 AM

## 2010-10-19 NOTE — Discharge Summary (Signed)
[name] [name], [name] [name] NO.:  [id number]  MEDICAL RECORD NO.:  [id number]  LOCATION:  5041                         FACILITY:  [facility]  PHYSICIAN:  Tamicka Shimon A. Thurston Hole, M.D. DATE OF BIRTH:  Jun 16, 1930  DATE OF ADMISSION:  09/13/2010 DATE OF DISCHARGE:  09/16/2010                        DISCHARGE SUMMARY - REFERRING   ADMITTING DIAGNOSES:  End-stage degenerative joint disease, right knee; anxiety; irritable bowel.  DISCHARGE DIAGNOSES:  End-stage degenerative joint disease, right knee status post right total knee replacement; acute blood loss anemia; hyponatremia; thrombocytopenia; irritable bowel; anxiety, peripheral neuropathy.  HISTORY OF PRESENT ILLNESS:  The patient is a 77 year old female with a history of end-stage DJD of her right knee with a significant valgus deformity and has failed conservative care including anti-inflammatories and interarticular cortisone injections.  Risks, benefits, and possible complications were discussed in detail with the patient and her husband. They were without question.  PROCEDURES IN-HOUSE:  The patient underwent a right total knee replacement by Dr. Thurston Hole on September 13, 2010.  She had a femoral nerve block and Autovac transfusion.  She tolerated all of these procedures well.  Postoperatively, she was placed in a CPM 0-60 degrees in the recovery room.  She was unable tolerate 90 degrees at that time.  On postop day #1, the patient's hemoglobin was 9.5.  She was afebrile, platelet count was slightly low at 139, O2 sat was 96% on room air.  She was metabolically stable.  Drain was discontinued.  Foley was discontinued.  She struggled with physical therapy declining due to pain control and lack of desire to get up.  She was given 2 fluid boluses to help with hypotension.  Her dressing was changed.  She was given milk of magnesia postop day #2.  The patient's hemoglobin remained stable.  She tolerated 0-70 on her CPM.  Surgical  wound was well-approximated.  She had a significant amount of swelling in her knee with a moderate amount of drainage when her dressing was changed.  She does have some redness about that knee.  She was given a Dulcolax suppository with success. She did better with physical therapy on postop day #2, ambulating 50 feet.  Postop day #3, the patient was significantly improved, ambulated 70 feet with physical therapy.  Her sodium was low at 134.  Her hemoglobin was stable at 9.0.  Her platelet count was 130 that has drifted down slightly.  This will need to be watched while she is in skilled nursing.  Her blood pressure is 128/46.  She is being discharged to skilled nursing, weightbearing as tolerated on a regular diet.  DISCHARGE MEDICATIONS: 1. Lovenox 30 mg subcu.  She will need 10 days worth of this, 1     injection q.12 h. 2. She is being discharged on gabapentin 300 mg 2 tablets p.o. q.p.m. 3. Lomotil she takes as needed for diarrhea. 4. Xanax 0.25 mg half to a whole tablet daily as needed for anxiety. 5. Timolol 0.5% ophthalmic solution 1 drop in her right eye daily.  It     is actually a combination of the timolol and dorzolamide and the     dorzolamide is 2 and the timolol  is 0.5. 6. Colace 100 mg twice a day while on narcotics. 7. Benadryl 25-50 mg p.o. q.6 h. p.r.n. itching. 8. Percocet 5/325, 1-2 q.4-6 h. p.r.n. pain.  She has been instructed to stop her aspirin a day.  She will need to have her platelets watched closely while she is in skilled facility. The redness and swelling in her right leg, she will need to have her right leg washed with soap and water every single day, padded dry and have a dry dressing applied to the wound.  She will follow up with Dr. Thurston Hole on September 28, 2010.  If she begins to run a fever greater than 101, please call our office [phone], increased pain, increased redness, increased swelling.  She will need CPM 0-90 degrees 6 hours a day, elevate  her right heel on a folded pillow when in bed.  It is actually a yellow foam block, not a folded pillow when in bed or when sitting in a chair.  She is weightbearing as tolerated, regular diet.  She needs daily physical therapy, occupational therapy, and skilled nursing.     Kirstin Shepperson, PA-C   ______________________________ Elana Alm Thurston Hole, M.D.    KS/MEDQ  D:  09/16/2010  T:  09/16/2010  Job:  [id number]  Electronically Signed by Julien Girt P.A. on 10/11/2010 09:29:46 AM Electronically Signed by Salvatore Marvel M.D. on 10/19/2010 07:44:49 AM

## 2010-10-26 LAB — POCT HEMOGLOBIN-HEMACUE: Hemoglobin: 12.4

## 2010-10-27 ENCOUNTER — Ambulatory Visit (INDEPENDENT_AMBULATORY_CARE_PROVIDER_SITE_OTHER): Payer: Medicare Other

## 2010-10-27 DIAGNOSIS — Z23 Encounter for immunization: Secondary | ICD-10-CM

## 2010-11-24 ENCOUNTER — Telehealth: Payer: Self-pay | Admitting: Internal Medicine

## 2010-11-24 MED ORDER — ALPRAZOLAM 0.5 MG PO TABS: 0.5000 mg | ORAL_TABLET | Freq: Two times a day (BID) | ORAL | Status: AC

## 2010-11-24 MED ORDER — ALPRAZOLAM 0.25 MG PO TABS: 0.5000 mg | ORAL_TABLET | Freq: Two times a day (BID) | ORAL | Status: DC

## 2010-11-24 NOTE — Telephone Encounter (Signed)
Change xanax to 0.5 mg two times daily as needed for anxiety. #40/1 refill

## 2010-11-24 NOTE — Telephone Encounter (Signed)
Pt called and said that her grandson died unexpectedly and she is needing a nerve pill prescribed to help her cope with this. Pls call in to [facility].

## 2010-11-26 ENCOUNTER — Ambulatory Visit: Payer: Medicare Other | Admitting: Internal Medicine

## 2010-11-29 ENCOUNTER — Other Ambulatory Visit: Payer: Self-pay | Admitting: Internal Medicine

## 2010-11-29 DIAGNOSIS — F0390 Unspecified dementia without behavioral disturbance: Secondary | ICD-10-CM

## 2010-11-30 ENCOUNTER — Encounter: Payer: Self-pay | Admitting: Neurology

## 2010-12-28 ENCOUNTER — Other Ambulatory Visit: Payer: Self-pay | Admitting: Neurology

## 2010-12-28 ENCOUNTER — Encounter: Payer: Self-pay | Admitting: Neurology

## 2010-12-28 ENCOUNTER — Ambulatory Visit (INDEPENDENT_AMBULATORY_CARE_PROVIDER_SITE_OTHER): Payer: Medicare Other | Admitting: Neurology

## 2010-12-28 ENCOUNTER — Other Ambulatory Visit (INDEPENDENT_AMBULATORY_CARE_PROVIDER_SITE_OTHER): Payer: Medicare Other

## 2010-12-28 VITALS — BP 144/66 | HR 84 | Wt 144.5 lb

## 2010-12-28 DIAGNOSIS — R413 Other amnesia: Secondary | ICD-10-CM

## 2010-12-28 NOTE — Patient Instructions (Signed)
Go to the basement to have your labs drawn today.  Your MRI is scheduled for Thursday, December 6th at 12:00noon.  Please arrive to [facility] by 11:45am.  First floor admitting. [phone]  We will call you with your appointment for the memory testing to be done at [facility] [address] [place], [place]  [phone]  Dr. Modesto Charon will see you a couple of weeks after the memory testing appointment.

## 2010-12-28 NOTE — Progress Notes (Signed)
Dear Dr. Cato Mulligan,  Thank you for having me see [name] [name] in consultation today at [facility] for her problem with memory complaints.  As you may recall, she is a 77 y.o. year old female with a history of alcohol abuse, depression and severe back pain s/p lumbar/thoracic vertebroplasty x 3 who presents with a two year history of forgetting events and conversations that has gotten progressively worse.  She also has become less interested in her favorite activity, reading, having not read a book in over six months.  She seems to have decreased energy with a lack of will to get out of the house.  She denies hallucinations, changes in her bladder habits or worsening walking, although her walking is chronically bad from her multiple vertebral compression fractures and chronic arthritis.  She denies a history of seizures, strokes, severe head trauma or meningitis.  She drinks 2-3 glasses of wine per day, and in the past used to drink bourbon.  She has a long history of depression, and says she is "just bored".  She wishes they could travel more, but she says she is limited by their health.  She has been on Paxil for years, although cannot recall the circumstances around it.  She also had the terrible loss of a grandson in an accident 1 month ago, so this clearly has disturbed her mood.  However, it is clear that mood problems have been a chronic issue.  Past Medical History  Diagnosis Date  . Vertebral fracture   . Depression   . Hyperlipidemia   . Hypertension   . Osteoporosis   . OSA (obstructive sleep apnea)   . PMR (polymyalgia rheumatica)   . Postherpetic neuralgia   . Diverticulosis of colon   . History of alcohol abuse   - PMR has not been clearly active for 5 years.  Past Surgical History  Procedure Date  . Rotator cuff repair   . Breast biopsy   . Vertebroplasty     x 3  . Appendectomy   . Cataract extraction   . Knee arthroscopy 10/2007 and 10/2010    left knee,   .  Dilation and curettage of uterus     x several    History   Social History  . Marital Status: Married    Spouse Name: N/A    Number of Children: N/A  . Years of Education: N/A   Social History Main Topics  . Smoking status: Former Smoker    Quit date: 01/25/1988  . Smokeless tobacco: Never Used  . Alcohol Use: Yes     2 glasses every day  . Drug Use: No  . Sexually Active: None   Other Topics Concern  . None   Social History Narrative  . None    Family History  Problem Relation Age of Onset  . Stroke Father 91  . Heart attack Father   . Alcohol abuse Mother   . Coronary artery disease Father 44  . Breast cancer Sister   . Heart disease Sister    - sister with memory problems.  Current Outpatient Prescriptions on File Prior to Visit  Medication Sig Dispense Refill  . aspirin 81 MG EC tablet Take 81 mg by mouth daily.        Marland Kitchen dicyclomine (BENTYL) 20 MG tablet Take 20 mg by mouth 2 (two) times daily.        . ergocalciferol (VITAMIN D2) 50000 UNITS capsule Take 1 capsule (50,000 Units total) by  mouth once a week.  4 capsule  0  . gabapentin (NEURONTIN) 300 MG capsule Take one in the morning and 2 in the evening  300 capsule  3  . LOMOTIL 2.5-0.025 MG per tablet TAKE 1 TABLET DAILY AS NEEDED.  60 each  0  . PARoxetine (PAXIL) 40 MG tablet TAKE 1 TABLET ONCE DAILY.  90 tablet  3  . paroxetine mesylate (PEXEVA) 40 MG tablet Take one tablet by mouth once a day       . traMADol (ULTRAM) 50 MG tablet Take 1 tablet (50 mg total) by mouth every 6 (six) hours as needed for pain.  10 tablet  0  . ondansetron (ZOFRAN) 4 MG tablet Take 1 tablet (4 mg total) by mouth every 12 (twelve) hours as needed for nausea.  15 tablet  0   Current Facility-Administered Medications on File Prior to Visit  Medication Dose Route Frequency Provider Last Rate Last Dose  . denosumab (PROLIA) injection 60 mg  60 mg Subcutaneous Once Judie Petit, MD      . denosumab (PROLIA) injection 60 mg   60 mg Subcutaneous Once Judie Petit, MD        Allergies  Allergen Reactions  . Celecoxib     REACTION: unspecified  . Clindamycin Hcl     REACTION: diarrhea  . Rofecoxib     REACTION: unspecified  . Sertraline Hcl     REACTION: unspecified      ROS:  13 systems were reviewed and are notable for chronic difficulty walking and instability.  All other review of systems are unremarkable.   Examination:  Filed Vitals:   12/28/10 1328  BP: 144/66  Pulse: 84  Weight: 144 lb 8 oz (65.545 kg)     In general, elderly appearing woman in NAD.  Cardiovascular: The patient has a regular rate and rhythm but tachycardic and no carotid bruits.  Fundoscopy:  Limited by pupillary constriction.  Mental Status MMSE 28/30 with points lost for time orientation.  Cranial Nerves: Pupils are equally round but constricted. Visual fields full to confrontation. Extraocular movements are intact without nystagmus. Facial sensation and muscles of mastication are intact. Muscles of facial expression are symmetric. Hearing intact to bilateral finger rub. Tongue protrusion, uvula, palate midline.  Shoulder shrug intact  Motor:  The patient has normal bulk and tone, no pronator drift.  There are no adventitious movements.  5/5 bilaterally, except at right hip flexor which is 4/5/  Reflexes:   Biceps  Triceps Brachioradialis Knee Ankle  Right 2+  2+  2+   2+ 2+  Left  2+  2+  2+   2+ 2+  Toes down  Coordination:  Normal finger to nose.    Sensation is decreased to temperature and vibration in a length dep manner.  Gait and Station are antalgic, stooped with decreased clear mobility in right knee.   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  I am going to get memory testing in order to clarify this issue.  She continues to see a therapist, but a change in her anti-depressant  regimen may make sense if depression is playing a prominent role in her memory dysfunction.  I am also going to get an MRI brain as well as B12 and MMA given her low normal B12 levels in the past.    We will see the patient back in 2  months.  Thank you for having [place] see [name] [name] in consultation.  Feel free to contact me with any questions.  Lupita Raider Modesto Charon, MD [facility], [place] [address] [place], [place] [address] Phone: [phone] Fax: [phone].

## 2010-12-30 ENCOUNTER — Inpatient Hospital Stay (HOSPITAL_COMMUNITY)
Admission: RE | Admit: 2010-12-30 | Discharge: 2010-12-30 | Payer: Medicare Other | Source: Ambulatory Visit | Attending: Neurology | Admitting: Neurology

## 2011-01-02 ENCOUNTER — Inpatient Hospital Stay: Admission: RE | Admit: 2011-01-02 | Payer: Medicare Other | Source: Ambulatory Visit

## 2011-01-04 ENCOUNTER — Other Ambulatory Visit (HOSPITAL_COMMUNITY): Payer: Medicare Other

## 2011-01-06 ENCOUNTER — Ambulatory Visit (INDEPENDENT_AMBULATORY_CARE_PROVIDER_SITE_OTHER): Payer: Medicare Other | Admitting: Internal Medicine

## 2011-01-06 ENCOUNTER — Ambulatory Visit
Admission: RE | Admit: 2011-01-06 | Discharge: 2011-01-06 | Disposition: A | Discharge status: Home / Self Care | Payer: Medicare Other | Source: Ambulatory Visit | Attending: Neurology | Admitting: Neurology

## 2011-01-06 VITALS — BP 142/84 | HR 82 | Temp 98.1°F

## 2011-01-06 DIAGNOSIS — R413 Other amnesia: Secondary | ICD-10-CM

## 2011-01-06 DIAGNOSIS — I10 Essential (primary) hypertension: Secondary | ICD-10-CM

## 2011-01-06 LAB — BASIC METABOLIC PANEL
BUN: 11 mg/dL (ref 6–23)
Calcium: 9.6 mg/dL (ref 8.4–10.5)
GFR: 75.78 mL/min (ref >60.00)
Glucose, Bld: 98 mg/dL (ref 70–99)
Potassium: 4.6 mEq/L (ref 3.5–5.1)
Sodium: 137 mEq/L (ref 135–145)

## 2011-01-06 LAB — CBC
Hemoglobin: 13.1 g/dL (ref 12.0–15.0)
MCHC: 33.4 g/dL (ref 30.0–36.0)
RDW: 16.2 % — ABNORMAL HIGH (ref 11.5–14.6)
WBC: 6.6 10*3/uL (ref 4.5–10.5)

## 2011-01-06 NOTE — Assessment & Plan Note (Signed)
BP Readings from Last 3 Encounters:  01/06/11 142/84  12/28/10 144/66  07/02/10 126/84  well controlled Continue meds

## 2011-01-06 NOTE — Progress Notes (Signed)
Patient ID: [name] [name], female   DOB: 1930/04/28, 77 y.o.   MRN: [id number]  Profound Grief-- She is doing as well to be expected (Grandson died)  htn---tolerating meds  Chronic pain---doing reasonably well.  Past Medical History  Diagnosis Date  . Vertebral fracture   . Depression   . Hyperlipidemia   . Hypertension   . Osteoporosis   . OSA (obstructive sleep apnea)   . PMR (polymyalgia rheumatica)   . Postherpetic neuralgia   . Diverticulosis of colon   . History of alcohol abuse     History   Social History  . Marital Status: Married    Spouse Name: N/A    Number of Children: N/A  . Years of Education: N/A   Occupational History  . Not on file.   Social History Main Topics  . Smoking status: Former Smoker    Quit date: 01/25/1988  . Smokeless tobacco: Never Used  . Alcohol Use: Yes     2 glasses every day  . Drug Use: No  . Sexually Active: Not on file   Other Topics Concern  . Not on file   Social History Narrative  . No narrative on file    Past Surgical History  Procedure Date  . Rotator cuff repair   . Breast biopsy   . Vertebroplasty     x 3  . Appendectomy   . Cataract extraction   . Knee arthroscopy 10/2007 and 10/2010    left knee,   . Dilation and curettage of uterus     x several    Family History  Problem Relation Age of Onset  . Stroke Father 59  . Heart attack Father   . Alcohol abuse Mother   . Coronary artery disease Father 84  . Breast cancer Sister   . Heart disease Sister     Allergies  Allergen Reactions  . Celecoxib     REACTION: unspecified  . Clindamycin Hcl     REACTION: diarrhea  . Rofecoxib     REACTION: unspecified  . Sertraline Hcl     REACTION: unspecified    Current Outpatient Prescriptions on File Prior to Visit  Medication Sig Dispense Refill  . aspirin 81 MG EC tablet Take 81 mg by mouth daily.        Marland Kitchen gabapentin (NEURONTIN) 300 MG capsule Take one in the morning and 2 in the evening  300  capsule  3  . LOMOTIL 2.5-0.025 MG per tablet TAKE 1 TABLET DAILY AS NEEDED.  60 each  0  . PARoxetine (PAXIL) 40 MG tablet TAKE 1 TABLET ONCE DAILY.  90 tablet  3  . traMADol (ULTRAM) 50 MG tablet Take 1 tablet (50 mg total) by mouth every 6 (six) hours as needed for pain.  10 tablet  0   Current Facility-Administered Medications on File Prior to Visit  Medication Dose Route Frequency Provider Last Rate Last Dose  . denosumab (PROLIA) injection 60 mg  60 mg Subcutaneous Once Judie Petit, MD      . denosumab (PROLIA) injection 60 mg  60 mg Subcutaneous Once Judie Petit, MD         patient denies chest pain, shortness of breath, orthopnea. Denies lower extremity edema, abdominal pain, change in appetite, change in bowel movements. Patient denies rashes, musculoskeletal complaints. No other specific complaints in a complete review of systems.   BP 142/84  Pulse 82  Temp(Src) 98.1 F (36.7 C) (Oral)  well-developed  well-nourished female in no acute distress. HEENT exam atraumatic, normocephalic, neck supple without jugular venous distention. Chest clear to auscultation cardiac exam S1-S2 are regular. Abdominal exam overweight with bowel sounds, soft and nontender. Extremities no edema. Neurologic exam is alert with a normal gait.

## 2011-01-10 NOTE — Progress Notes (Signed)
Quick Note:  Pt aware of results. ______ 

## 2011-01-19 NOTE — Progress Notes (Signed)
Called and spoke with the patient and her husband. Info given re: MRI results. No other concerns voiced. Neuropsych testing to be done next week.

## 2011-01-21 ENCOUNTER — Telehealth: Payer: Self-pay | Admitting: *Deleted

## 2011-01-21 MED ORDER — ALPRAZOLAM 0.25 MG PO TABS: 0.2500 mg | ORAL_TABLET | Freq: Two times a day (BID) | ORAL | Status: AC | PRN

## 2011-01-21 NOTE — Telephone Encounter (Signed)
Refill xanax, ok per Dr Lovell Sheehan

## 2011-03-04 ENCOUNTER — Telehealth: Payer: Self-pay | Admitting: Neurology

## 2011-03-04 NOTE — Telephone Encounter (Signed)
Pt's daughter would like to discuss her mother's treatment plan and find out what happened during her appointment. She is on pt's medical release so it is okay to speak with her. According to office note, pt is supposed to come back for a 2 mo fu, but no appt has been scheduled. Please call daughter and advise.

## 2011-03-04 NOTE — Telephone Encounter (Signed)
Called and spoke with the patient's daughter, [name] [name]. Our conversation was approximately 30 minutes long. We talked about her office visit when she was seen by Dr. Modesto Charon on 12/28/10. We discussed her alcohol consumption, the death of her grandson, her MMSE score and the results of her MRI and lab values. Dr. Modesto Charon had wanted to see her back in 2 months (should have had an appointment this month) but the patient did not have a f/u appointment for whatever reason. I did schedule an appointment for the patient on 04/26/11 at 0930. [name] [name] will come with her. She did report that her mom had her neuropsych eval with Dr. McDermitt. I told her we did not yet have those results but that on Monday I would call and request those for Dr. Modesto Charon to review. I will also call the patient on Monday and let her know about her f/u appointment. The daughter agreed with this plan.

## 2011-03-06 NOTE — Progress Notes (Signed)
Got results of NP testing.  Concern for MCI - amnestic type, but also could not rule out depression as the main cause.  Felt that treatment of depression first made sense to see if this helped memory difficulties.

## 2011-03-07 NOTE — Telephone Encounter (Signed)
Left Delice Bison at [facility] a message to fax the results of patient's neuropsych eval to [place].

## 2011-03-08 NOTE — Telephone Encounter (Signed)
Called and spoke with the patient. Informed of the f/u appointment with Dr. Modesto Charon on 04/26/11 at 0930. Pt wrote it on her calendar. She states she is doing well.

## 2011-04-08 ENCOUNTER — Other Ambulatory Visit: Payer: Self-pay | Admitting: Internal Medicine

## 2011-04-08 ENCOUNTER — Telehealth: Payer: Self-pay | Admitting: Internal Medicine

## 2011-04-08 NOTE — Telephone Encounter (Signed)
Please refill Bentyl 20mg , #60, 1 po bid , no refill

## 2011-04-08 NOTE — Telephone Encounter (Signed)
Spoke with patient and she is calling for a refill on Bentyl. Explained to patient that I did not see an rx for this medication since 2011. She was confused about this and thought she had been taking it BID. When she got her bottle, she found that the last refill was 07/2010 for #60 tablets. She could not have been taking it BID since that was last refill. Called [facility] and confirmed 07/2010 was last refill. Patient scheduled for OV on 05/03/11 at 8:45 AM. She states she is doing well but wants to know if Dr. Juanda Chance will refill Bentyl until her OV in April. Please, advise if I can refill Bentyl.

## 2011-04-11 MED ORDER — DICYCLOMINE HCL 20 MG PO TABS: ORAL_TABLET | ORAL | Status: DC

## 2011-04-11 NOTE — Telephone Encounter (Signed)
Addended by: Daphine Deutscher on: 04/11/2011 08:30 AM   Modules accepted: Orders

## 2011-04-11 NOTE — Telephone Encounter (Signed)
Spoke with patient and Rx sent °

## 2011-04-19 ENCOUNTER — Encounter: Payer: Self-pay | Admitting: *Deleted

## 2011-04-26 ENCOUNTER — Ambulatory Visit (INDEPENDENT_AMBULATORY_CARE_PROVIDER_SITE_OTHER): Payer: Medicare Other | Admitting: Neurology

## 2011-04-26 ENCOUNTER — Encounter: Payer: Self-pay | Admitting: Neurology

## 2011-04-26 VITALS — BP 142/76 | HR 80

## 2011-04-26 DIAGNOSIS — F329 Major depressive disorder, single episode, unspecified: Secondary | ICD-10-CM

## 2011-04-26 DIAGNOSIS — F419 Anxiety disorder, unspecified: Secondary | ICD-10-CM

## 2011-04-26 DIAGNOSIS — F4321 Adjustment disorder with depressed mood: Secondary | ICD-10-CM

## 2011-04-26 DIAGNOSIS — F411 Generalized anxiety disorder: Secondary | ICD-10-CM

## 2011-04-26 NOTE — Progress Notes (Signed)
  Dear Dr. Cato Mulligan,  Thank you for having me see [name] [name] back today at [facility] for her problem with memory.  As you may recall, she is a 78 y.o. year old female with a history of alcohol abuse, anxiety and depression and recent grief from the loss of her grandson who has had a progressive worsening of memory.  I felt when I first saw her that she may have dementia although pseudodementia from her anxiety and depression were equally as likely as the cause of her memory problems.  MRI Brain revealed global atrophy with no clear pattern.  She had NP testing by Dr. Orie Fisherman who agreed that she may have MCI- Amnestic type or pseudo dementia related to her severe anxiety and depression.  He recommended further psychologic and psychiatric consultation.  She remains on her Paxil.  Her husband says that she has become much less withdrawn over the last few weeks, although is still grief stricken with the loss of her grandson.  She does seem to perseverate on questions at times - obsessing about events that are going to occur for example.  She wants to return to driving the car at sometime.  However, she is anxiety stricken when she is a passenger in the car and refuses to even travel to [place] for anything because of the trauma related to her last trip.  Medical history, social history, and family history were reviewed and have not changed since the last clinic visit.  Current Outpatient Prescriptions on File Prior to Visit  Medication Sig Dispense Refill  . aspirin 81 MG EC tablet Take 81 mg by mouth daily.        Marland Kitchen dicyclomine (BENTYL) 20 MG tablet Take one po BID. No more refills until office visit.  60 tablet  0  . gabapentin (NEURONTIN) 300 MG capsule Take one in the morning and 2 in the evening  300 capsule  3  . LOMOTIL 2.5-0.025 MG per tablet TAKE 1 TABLET DAILY AS NEEDED.  60 each  0  . PARoxetine (PAXIL) 40 MG tablet TAKE 1 TABLET ONCE DAILY.  90 tablet  3    Allergies    Allergen Reactions  . Celecoxib     REACTION: unspecified  . Clindamycin Hcl     REACTION: diarrhea  . Rofecoxib     REACTION: unspecified  . Sertraline Hcl     REACTION: unspecified    ROS:  13 systems were reviewed and are notable for chronic difficulty walking and instability.  All other review of systems are unremarkable.  Exam: . Filed Vitals:   04/26/11 0934  BP: 142/76  Pulse: 80   Impression/Recommendations:  1.  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  I have referred her to Dr. Caralyn Guile to see if he could engage her in psychotherapy for her anxiety and depression.  He also will likely have a suggestion for a psychiatrist as she likely needs a change to her depression and anxiety medication as well.  If her memory does not improve with better treatment of her anxiety and depression I can see her back for consideration of placing her on a cholinesterase inhibitor.  Over 50% of the 25 minute appointment was spent counseling the patient and family.  Lupita Raider Modesto Charon, MD [facility], [place]

## 2011-04-26 NOTE — Patient Instructions (Signed)
Dr. Dawayne Cirri office will call you to schedule your appointment.

## 2011-05-03 ENCOUNTER — Encounter: Payer: Self-pay | Admitting: Internal Medicine

## 2011-05-03 ENCOUNTER — Ambulatory Visit (INDEPENDENT_AMBULATORY_CARE_PROVIDER_SITE_OTHER): Payer: Medicare Other | Admitting: Internal Medicine

## 2011-05-03 VITALS — BP 130/70 | HR 78 | Ht 64.0 in | Wt 145.0 lb

## 2011-05-03 DIAGNOSIS — K589 Irritable bowel syndrome without diarrhea: Secondary | ICD-10-CM

## 2011-05-03 NOTE — Progress Notes (Signed)
[name] [name] 04-30-1930 MRN [id number]    History of Present Illness:  This is a 78 year old white female with diarrhea predominant irritable bowel syndrome. She has been under good control with Bentyl 20 mg twice a day. Her last colonoscopy in 2004 showed mild diverticulosis. She is due for a recall colonoscopy in May 2014. She has had chronic back pain for which she takes narcotics. There is a history of alcohol abuse. She has a history of postherpetic neuralgia and anemia. Her last office visit was on 07/2009.   Past Medical History  Diagnosis Date  . Vertebral fracture   . Depression   . Hyperlipidemia   . Hypertension   . Osteoporosis   . OSA (obstructive sleep apnea)   . PMR (polymyalgia rheumatica)   . Postherpetic neuralgia   . Diverticulosis of colon   . History of alcohol abuse   . Vitamin d deficiency   . Spinal stenosis   . Anemia    Past Surgical History  Procedure Date  . Rotator cuff repair   . Breast biopsy   . Vertebroplasty     x 3  . Appendectomy   . Cataract extraction   . Knee arthroscopy 10/2007 and 10/2010    left  . Dilation and curettage of uterus     x several    reports that she quit smoking about 23 years ago. She has never used smokeless tobacco. She reports that she drinks alcohol. She reports that she does not use illicit drugs. family history includes Alcohol abuse in her mother; Breast cancer in her sister; Coronary artery disease (age of onset:40) in her father; Heart attack in her father; Heart disease in her sister; and Stroke (age of onset:76) in her father.  There is no history of Colon cancer. Allergies  Allergen Reactions  . Celecoxib     REACTION: unspecified  . Clindamycin Hcl     REACTION: diarrhea  . Rofecoxib     REACTION: unspecified  . Sertraline Hcl     REACTION: unspecified        Review of Systems: Denies dysphagia chest pain shortness of breath  The remainder of the 10 point ROS is negative except as outlined  in H&P   Physical Exam: General appearance  Well developed, in no distress. Neurological alert and oriented x 3. Psychological normal mood and affect.  Assessment and Plan:  Problem #27 78 year-old white female with irritable bowel syndrome, under good control using antispasmodics. She will be due for a recall colonoscopy next year. We will refill her Bentyl. She will continue on a high fiber diet.   05/03/2011 Lina Sar

## 2011-05-03 NOTE — Patient Instructions (Addendum)
Dr Cato Mulligan

## 2011-05-04 ENCOUNTER — Ambulatory Visit (INDEPENDENT_AMBULATORY_CARE_PROVIDER_SITE_OTHER): Payer: Medicare Other | Admitting: Psychology

## 2011-05-04 DIAGNOSIS — F4323 Adjustment disorder with mixed anxiety and depressed mood: Secondary | ICD-10-CM

## 2011-05-14 ENCOUNTER — Other Ambulatory Visit: Payer: Self-pay | Admitting: Internal Medicine

## 2011-05-18 ENCOUNTER — Ambulatory Visit (INDEPENDENT_AMBULATORY_CARE_PROVIDER_SITE_OTHER): Payer: Medicare Other | Admitting: Psychology

## 2011-05-18 DIAGNOSIS — F4323 Adjustment disorder with mixed anxiety and depressed mood: Secondary | ICD-10-CM

## 2011-06-10 ENCOUNTER — Ambulatory Visit (INDEPENDENT_AMBULATORY_CARE_PROVIDER_SITE_OTHER): Payer: Medicare Other | Admitting: Psychology

## 2011-06-10 DIAGNOSIS — F4323 Adjustment disorder with mixed anxiety and depressed mood: Secondary | ICD-10-CM

## 2011-06-22 ENCOUNTER — Other Ambulatory Visit: Payer: Self-pay | Admitting: Internal Medicine

## 2011-06-24 ENCOUNTER — Ambulatory Visit (INDEPENDENT_AMBULATORY_CARE_PROVIDER_SITE_OTHER): Payer: Medicare Other | Admitting: Psychology

## 2011-06-24 DIAGNOSIS — F4323 Adjustment disorder with mixed anxiety and depressed mood: Secondary | ICD-10-CM

## 2011-06-29 ENCOUNTER — Other Ambulatory Visit: Payer: Self-pay | Admitting: Internal Medicine

## 2011-07-09 ENCOUNTER — Other Ambulatory Visit: Payer: Self-pay | Admitting: Internal Medicine

## 2011-08-31 ENCOUNTER — Other Ambulatory Visit: Payer: Self-pay | Admitting: Internal Medicine

## 2011-09-23 ENCOUNTER — Other Ambulatory Visit: Payer: Self-pay | Admitting: Internal Medicine

## 2011-09-27 ENCOUNTER — Other Ambulatory Visit: Payer: Self-pay | Admitting: Internal Medicine

## 2011-10-14 ENCOUNTER — Ambulatory Visit (INDEPENDENT_AMBULATORY_CARE_PROVIDER_SITE_OTHER): Payer: Medicare Other | Admitting: Internal Medicine

## 2011-10-14 ENCOUNTER — Telehealth: Payer: Self-pay | Admitting: Internal Medicine

## 2011-10-14 ENCOUNTER — Encounter: Payer: Self-pay | Admitting: Internal Medicine

## 2011-10-14 VITALS — BP 116/72 | Temp 98.2°F

## 2011-10-14 DIAGNOSIS — F3289 Other specified depressive episodes: Secondary | ICD-10-CM

## 2011-10-14 DIAGNOSIS — R531 Weakness: Secondary | ICD-10-CM

## 2011-10-14 DIAGNOSIS — G894 Chronic pain syndrome: Secondary | ICD-10-CM

## 2011-10-14 DIAGNOSIS — M179 Osteoarthritis of knee, unspecified: Secondary | ICD-10-CM

## 2011-10-14 DIAGNOSIS — R5383 Other fatigue: Secondary | ICD-10-CM

## 2011-10-14 DIAGNOSIS — F329 Major depressive disorder, single episode, unspecified: Secondary | ICD-10-CM

## 2011-10-14 DIAGNOSIS — Z23 Encounter for immunization: Secondary | ICD-10-CM

## 2011-10-14 DIAGNOSIS — E559 Vitamin D deficiency, unspecified: Secondary | ICD-10-CM

## 2011-10-14 DIAGNOSIS — M1710 Unilateral primary osteoarthritis, unspecified knee: Secondary | ICD-10-CM

## 2011-10-14 DIAGNOSIS — M810 Age-related osteoporosis without current pathological fracture: Secondary | ICD-10-CM

## 2011-10-14 LAB — CBC WITH DIFFERENTIAL/PLATELET
Basophils Relative: 0.9 % (ref 0.0–3.0)
Eosinophils Absolute: 0.1 10*3/uL (ref 0.0–0.7)
Eosinophils Relative: 1.3 % (ref 0.0–5.0)
HCT: 35.6 % — ABNORMAL LOW (ref 36.0–46.0)
Hemoglobin: 11.6 g/dL — ABNORMAL LOW (ref 12.0–15.0)
Lymphocytes Relative: 51.1 % — ABNORMAL HIGH (ref 12.0–46.0)
MCV: 104.7 fl — ABNORMAL HIGH (ref 78.0–100.0)
Neutrophils Relative %: 37.7 % — ABNORMAL LOW (ref 43.0–77.0)

## 2011-10-14 LAB — COMPREHENSIVE METABOLIC PANEL
ALT: 20 U/L (ref 0–35)
AST: 24 U/L (ref 0–37)
Albumin: 3.8 g/dL (ref 3.5–5.2)
Alkaline Phosphatase: 54 U/L (ref 39–117)
Potassium: 4.9 mEq/L (ref 3.5–5.1)
Sodium: 136 mEq/L (ref 135–145)
Total Bilirubin: 0.7 mg/dL (ref 0.3–1.2)
Total Protein: 7.8 g/dL (ref 6.0–8.3)

## 2011-10-14 LAB — TSH: TSH: 0.69 u[IU]/mL (ref 0.35–5.50)

## 2011-10-14 LAB — SEDIMENTATION RATE: Sed Rate: 42 mm/hr — ABNORMAL HIGH (ref 0–22)

## 2011-10-14 MED ORDER — BUPROPION HCL ER (XL) 150 MG PO TB24: 150.0000 mg | ORAL_TABLET | Freq: Every day | ORAL | Status: DC

## 2011-10-14 NOTE — Progress Notes (Signed)
Subjective:    Patient ID: [name] [name], female    DOB: 17-Jul-1930, 78 y.o.   MRN: [id number]  HPI  78 year old patient who has multiple medical problems including depression osteoarthritis chronic pain obstructive sleep apnea and arthritis. She complains of a two-month history of fatigue. No other constitutional complaints. She feels that she may be more depressed. She is approaching the 1 year anniversary of the death of a 21 year old grandson. Weight has been stable  Past Medical History  Diagnosis Date  . Vertebral fracture   . Depression   . Hyperlipidemia   . Hypertension   . Osteoporosis   . OSA (obstructive sleep apnea)   . PMR (polymyalgia rheumatica)   . Postherpetic neuralgia   . Diverticulosis of colon   . History of alcohol abuse   . Vitamin d deficiency   . Spinal stenosis   . Anemia     History   Social History  . Marital Status: Married    Spouse Name: N/A    Number of Children: N/A  . Years of Education: N/A   Occupational History  . Not on file.   Social History Main Topics  . Smoking status: Former Smoker    Quit date: 01/25/1988  . Smokeless tobacco: Never Used  . Alcohol Use: Yes     2 glasses every day  . Drug Use: No  . Sexually Active: Not on file   Other Topics Concern  . Not on file   Social History Narrative  . No narrative on file    Past Surgical History  Procedure Date  . Rotator cuff repair   . Breast biopsy   . Vertebroplasty     x 3  . Appendectomy   . Cataract extraction   . Knee arthroscopy 10/2007 and 10/2010    left  . Dilation and curettage of uterus     x several    Family History  Problem Relation Age of Onset  . Stroke Father 3  . Heart attack Father   . Alcohol abuse Mother   . Coronary artery disease Father 59  . Breast cancer Sister   . Heart disease Sister   . Colon cancer Neg Hx     Allergies  Allergen Reactions  . Celecoxib     REACTION: unspecified  . Clindamycin Hcl     REACTION:  diarrhea  . Rofecoxib     REACTION: unspecified  . Sertraline Hcl     REACTION: unspecified    Current Outpatient Prescriptions on File Prior to Visit  Medication Sig Dispense Refill  . aspirin 81 MG EC tablet Take 81 mg by mouth daily.        . BENTYL 20 MG tablet TAKE 1 TABLET TWICE DAILY.  60 each  3  . LOMOTIL 2.5-0.025 MG per tablet TAKE 1 TABLET DAILY AS NEEDED.  30 each  2  . NEURONTIN 300 MG capsule TAKE 2 CAPSULES EACH EVENING.  60 each  5  . PARoxetine (PAXIL) 40 MG tablet TAKE 1 TABLET ONCE DAILY.  90 tablet  1  . XANAX 0.25 MG tablet TAKE 1/2 TO 1 TABLET ONCE DAILY AS NEEDED  30 each  2    BP 116/72  Temp 98.2 F (36.8 C) (Oral)        Review of Systems  Constitutional: Positive for fatigue.  HENT: Negative for hearing loss, congestion, sore throat, rhinorrhea, dental problem, sinus pressure and tinnitus.   Eyes: Negative for pain, discharge and visual  disturbance.  Respiratory: Negative for cough and shortness of breath.   Cardiovascular: Negative for chest pain, palpitations and leg swelling.  Gastrointestinal: Negative for nausea, vomiting, abdominal pain, diarrhea, constipation, blood in stool and abdominal distention.  Genitourinary: Negative for dysuria, urgency, frequency, hematuria, flank pain, vaginal bleeding, vaginal discharge, difficulty urinating, vaginal pain and pelvic pain.  Musculoskeletal: Negative for joint swelling, arthralgias and gait problem.  Skin: Negative for rash.  Neurological: Positive for weakness. Negative for dizziness, syncope, speech difficulty, numbness and headaches.  Hematological: Negative for adenopathy.  Psychiatric/Behavioral: Positive for decreased concentration. Negative for behavioral problems, dysphoric mood and agitation. The patient is not nervous/anxious.        Objective:   Physical Exam  Constitutional: She is oriented to person, place, and time. She appears well-developed and well-nourished.  HENT:  Head:  Normocephalic.  Right Ear: External ear normal.  Left Ear: External ear normal.  Mouth/Throat: Oropharynx is clear and moist.  Eyes: Conjunctivae normal and EOM are normal. Pupils are equal, round, and reactive to light.  Neck: Normal range of motion. Neck supple. No thyromegaly present.  Cardiovascular: Normal rate, regular rhythm, normal heart sounds and intact distal pulses.   Pulmonary/Chest: Effort normal and breath sounds normal.       Kyphosis  Abdominal: Soft. Bowel sounds are normal. She exhibits no mass. There is no tenderness.  Musculoskeletal: Normal range of motion.  Lymphadenopathy:    She has no cervical adenopathy.  Neurological: She is alert and oriented to person, place, and time.  Skin: Skin is warm and dry. No rash noted.       Excoriations posterior back  Psychiatric: She has a normal mood and affect. Her behavior is normal.          Assessment & Plan:   Fatigue. Probably related to depression. History of OSA Depression Will augment with Wellbutrin Laboratory update Osteoarthritis  Recheck 6 weeks

## 2011-10-14 NOTE — Patient Instructions (Signed)
It is important that you exercise regularly, at least 20 minutes 3 to 4 times per week.  If you develop chest pain or shortness of breath seek  medical attention.   

## 2011-10-14 NOTE — Telephone Encounter (Signed)
Patient is requesting a change in PCP from Dr. Cato Mulligan to Dr. Kirtland Bouchard.  Please advise if this is ok

## 2011-10-14 NOTE — Telephone Encounter (Signed)
ok 

## 2011-10-17 NOTE — Telephone Encounter (Signed)
Called pt and sch ov with Dr Selena Batten as noted per both doctors.

## 2011-10-17 NOTE — Telephone Encounter (Signed)
[name] [name], please see below. [place] Dr. Kirtland Bouchard and Dr. Cato Mulligan feel she should see Dr. Selena Batten. Please call to sched.

## 2011-10-17 NOTE — Telephone Encounter (Signed)
agree

## 2011-10-17 NOTE — Telephone Encounter (Signed)
Might be best for her to see Dr. Selena Batten

## 2011-10-17 NOTE — Telephone Encounter (Signed)
Dr. Kirtland Bouchard, please see Dr. Cato Mulligan' recommendation. Please advise.

## 2011-11-08 ENCOUNTER — Ambulatory Visit: Payer: Medicare Other | Admitting: Internal Medicine

## 2011-11-23 NOTE — Telephone Encounter (Signed)
Patient called back today and states she has decided to remain w/Dr. Cato Mulligan. Has not transferred or established w/another [facility] MD. F/u appt made w/Dr. Cato Mulligan at pt's request.

## 2011-11-25 ENCOUNTER — Ambulatory Visit: Payer: Medicare Other | Admitting: Internal Medicine

## 2011-11-25 ENCOUNTER — Ambulatory Visit: Payer: Medicare Other | Admitting: Family Medicine

## 2011-12-20 ENCOUNTER — Ambulatory Visit: Payer: Medicare Other | Admitting: Internal Medicine

## 2012-02-06 ENCOUNTER — Ambulatory Visit: Payer: Medicare Other | Admitting: Internal Medicine

## 2012-02-07 ENCOUNTER — Ambulatory Visit: Payer: Medicare Other | Admitting: Internal Medicine

## 2012-03-22 ENCOUNTER — Telehealth: Payer: Self-pay | Admitting: Internal Medicine

## 2012-03-22 NOTE — Telephone Encounter (Signed)
Caller: [name] [name]/Child; Phone: [phone]; Reason for Call: She is calling because she needs a letter to state why she needs power of attorney.  Her mom and dad are each others power of attorneys and she is secondary.  Her dad an aortic dissection in Dec with emergency surgery and he is in rehab and may be in another couple months.  The mom has dementia so doesn't have mental capacity to be paying bills etc.  [name] [name] went to court house to sign for power of attorney and was told she needed a letter stating why she needs to be power of attorney since both parents are living.  Her father is [name] [name] 09-01-1925.

## 2012-03-23 NOTE — Telephone Encounter (Signed)
She has been evaluated by [facility] in the past. She should probably go back for evaluation

## 2012-03-23 NOTE — Telephone Encounter (Signed)
Left message for pt to call back  °

## 2012-04-30 DIAGNOSIS — Z0279 Encounter for issue of other medical certificate: Secondary | ICD-10-CM

## 2012-05-15 ENCOUNTER — Encounter: Payer: Self-pay | Admitting: Internal Medicine

## 2012-05-16 ENCOUNTER — Encounter: Payer: Self-pay | Admitting: Internal Medicine

## 2012-07-04 ENCOUNTER — Encounter: Payer: Self-pay | Admitting: *Deleted

## 2012-10-29 ENCOUNTER — Ambulatory Visit: Payer: Medicare Other | Admitting: Family Medicine

## 2013-02-06 ENCOUNTER — Ambulatory Visit: Payer: Medicare Other | Admitting: Internal Medicine

## 2013-02-25 ENCOUNTER — Telehealth: Payer: Self-pay | Admitting: Internal Medicine

## 2013-02-25 NOTE — Telephone Encounter (Signed)
i am not taking any new Medicare for pts that are already established.  We do have a new provider starting this summer and my suggestion is that they consider making change as we add new providers.

## 2013-02-25 NOTE — Telephone Encounter (Signed)
Patient's husband had an appointment today, but he also wants to request that his wife change her PCP from Dr. Cato MulliganSwords to Dr. Caryl NeverBurchette (he is dong this on her behalf). He states that they reside in a retirement community and need their doctor appt's on specific days due to the schedule of their limo transportation and that Dr. Cato MulliganSwords schedule isn't suitable for her needs. I need the "ok" from both providers in order to make this change. Thanks!

## 2013-02-28 NOTE — Telephone Encounter (Signed)
Patient was notified. Thanks!

## 2013-03-04 ENCOUNTER — Encounter: Payer: Self-pay | Admitting: Family Medicine

## 2013-03-04 ENCOUNTER — Ambulatory Visit (INDEPENDENT_AMBULATORY_CARE_PROVIDER_SITE_OTHER): Payer: Medicare PPO | Admitting: Family Medicine

## 2013-03-04 VITALS — BP 132/70 | HR 74 | Temp 98.4°F | Wt 154.0 lb

## 2013-03-04 DIAGNOSIS — M810 Age-related osteoporosis without current pathological fracture: Secondary | ICD-10-CM

## 2013-03-04 DIAGNOSIS — R5383 Other fatigue: Principal | ICD-10-CM

## 2013-03-04 DIAGNOSIS — R5381 Other malaise: Secondary | ICD-10-CM

## 2013-03-04 LAB — BASIC METABOLIC PANEL
BUN: 18 mg/dL (ref 6–23)
CALCIUM: 9.3 mg/dL (ref 8.4–10.5)
CO2: 26 meq/L (ref 19–32)
CREATININE: 0.7 mg/dL (ref 0.4–1.2)
Chloride: 104 mEq/L (ref 96–112)
GFR: 89.82 mL/min (ref >60.00)
Glucose, Bld: 79 mg/dL (ref 70–99)
Potassium: 4.7 mEq/L (ref 3.5–5.1)
Sodium: 137 mEq/L (ref 135–145)

## 2013-03-04 LAB — CBC WITH DIFFERENTIAL/PLATELET
BASOS ABS: 0.1 10*3/uL (ref 0.0–0.1)
BASOS PCT: 0.9 % (ref 0.0–3.0)
EOS ABS: 0.1 10*3/uL (ref 0.0–0.7)
Eosinophils Relative: 1.7 % (ref 0.0–5.0)
HCT: 35.8 % — ABNORMAL LOW (ref 36.0–46.0)
Hemoglobin: 11.7 g/dL — ABNORMAL LOW (ref 12.0–15.0)
LYMPHS PCT: 55 % — AB (ref 12.0–46.0)
Lymphs Abs: 4.3 10*3/uL — ABNORMAL HIGH (ref 0.7–4.0)
MCHC: 32.7 g/dL (ref 30.0–36.0)
MCV: 98.7 fl (ref 78.0–100.0)
MONOS PCT: 8.8 % (ref 3.0–12.0)
Monocytes Absolute: 0.7 10*3/uL (ref 0.1–1.0)
NEUTROS PCT: 33.6 % — AB (ref 43.0–77.0)
Neutro Abs: 2.6 10*3/uL (ref 1.4–7.7)
Platelets: 237 10*3/uL (ref 150.0–400.0)
RBC: 3.63 Mil/uL — AB (ref 3.87–5.11)
RDW: 16.2 % — AB (ref 11.5–14.6)
WBC: 7.9 10*3/uL (ref 4.5–10.5)

## 2013-03-04 LAB — TSH: TSH: 3.59 u[IU]/mL (ref 0.35–5.50)

## 2013-03-04 NOTE — Progress Notes (Signed)
Pre visit review using our clinic review tool, if applicable. No additional management support is needed unless otherwise documented below in the visit note. 

## 2013-03-04 NOTE — Progress Notes (Signed)
Subjective:    Patient ID: [name] [name], female    DOB: February 26, 1930, 80 y.o.   MRN: [id number]  HPI Patient is seen for medical follow up She has history of depression and currently brings in bottles of Paxil and Wellbutrin but apparently is not taking these very regularly at all. She feels her depression is stable off medications. She had loss of grandson over year ago but is improving over time. She is sleeping well. Good appetite. No recent weight changes.  She's not taking any medications regularly. She and her husband live at [employer].  Her major complaint is increased fatigue. This is daily. She is sleeping well. She doesn't take any supplements. Denies any chest pains. No dyspnea. No fevers or chills. No myalgias. No significant arthralgias.  Past Medical History  Diagnosis Date  . Vertebral fracture   . Depression   . Hyperlipidemia   . Hypertension   . Osteoporosis   . OSA (obstructive sleep apnea)   . PMR (polymyalgia rheumatica)   . Postherpetic neuralgia   . Diverticulosis of colon   . History of alcohol abuse   . Vitamin D deficiency   . Spinal stenosis   . Anemia    Past Surgical History  Procedure Laterality Date  . Rotator cuff repair    . Breast biopsy    . Vertebroplasty      x 3  . Appendectomy    . Cataract extraction    . Knee arthroscopy  10/2007 and 10/2010    left  . Dilation and curettage of uterus      x several    reports that she quit smoking about 25 years ago. She has never used smokeless tobacco. She reports that she drinks alcohol. She reports that she does not use illicit drugs. family history includes Alcohol abuse in her mother; Breast cancer in her sister; Coronary artery disease (age of onset: 66) in her father; Heart attack in her father; Heart disease in her sister; Stroke (age of onset: 34) in her father. There is no history of Colon cancer. Allergies  Allergen Reactions  . Celecoxib     REACTION: unspecified  . Clindamycin  Hcl     REACTION: diarrhea  . Rofecoxib     REACTION: unspecified  . Sertraline Hcl     REACTION: unspecified      Review of Systems  Constitutional: Positive for fatigue. Negative for fever, chills, appetite change and unexpected weight change.  Respiratory: Negative for cough and shortness of breath.   Cardiovascular: Negative for chest pain and leg swelling.  Gastrointestinal: Negative for abdominal pain.  Endocrine: Negative for polydipsia and polyuria.  Genitourinary: Negative for dysuria and hematuria.  Musculoskeletal: Negative for arthralgias.  Neurological: Negative for dizziness and headaches.  Hematological: Negative for adenopathy.  Psychiatric/Behavioral: Negative for confusion and dysphoric mood.       Objective:   Physical Exam  Constitutional: She is oriented to person, place, and time. She appears well-developed and well-nourished.  HENT:  Right Ear: External ear normal.  Left Ear: External ear normal.  Mouth/Throat: Oropharynx is clear and moist.  Neck: Neck supple. No thyromegaly present.  Cardiovascular: Normal rate.   Pulmonary/Chest: Effort normal.  She does have some bibasilar crackles-question chronic  Abdominal: Soft. There is no tenderness.  Musculoskeletal: She exhibits no edema.  Lymphadenopathy:    She has no cervical adenopathy.  Neurological: She is alert and oriented to person, place, and time.  Assessment & Plan:  #1 history of depression. Currently stable off medication. She apparently has been taking occasional Paxil and Wellbutrin in the past we have recommended stopping these and she has not taken regular basis #2 fatigue. Likely multifactorial. Currently not taking any regular medications. Her sleep appears to be adequate. Obtain labs with CBC, basic metabolic panel, TSH, vitamin D level. She apparently had some chronic normocytic anemia the past. Previous B12 levels within normal.

## 2013-03-05 LAB — VITAMIN D 25 HYDROXY (VIT D DEFICIENCY, FRACTURES): VIT D 25 HYDROXY: 12 ng/mL — AB (ref 30–89)

## 2013-03-06 ENCOUNTER — Other Ambulatory Visit: Payer: Self-pay

## 2013-03-06 MED ORDER — VITAMIN D (ERGOCALCIFEROL) 1.25 MG (50000 UNIT) PO CAPS: 50000.0000 [IU] | ORAL_CAPSULE | ORAL | Status: DC

## 2013-03-08 ENCOUNTER — Other Ambulatory Visit: Payer: Self-pay | Admitting: Family Medicine

## 2013-03-08 DIAGNOSIS — E559 Vitamin D deficiency, unspecified: Secondary | ICD-10-CM

## 2013-05-08 ENCOUNTER — Encounter: Payer: Self-pay | Admitting: Internal Medicine

## 2013-06-05 ENCOUNTER — Other Ambulatory Visit: Payer: Medicare PPO

## 2013-07-01 ENCOUNTER — Encounter: Payer: Self-pay | Admitting: Internal Medicine

## 2013-07-01 ENCOUNTER — Other Ambulatory Visit (INDEPENDENT_AMBULATORY_CARE_PROVIDER_SITE_OTHER): Payer: Medicare PPO

## 2013-07-01 DIAGNOSIS — E559 Vitamin D deficiency, unspecified: Secondary | ICD-10-CM

## 2013-07-02 ENCOUNTER — Other Ambulatory Visit: Payer: Self-pay | Admitting: Family Medicine

## 2013-07-02 DIAGNOSIS — E559 Vitamin D deficiency, unspecified: Secondary | ICD-10-CM

## 2013-07-02 LAB — VITAMIN D 25 HYDROXY (VIT D DEFICIENCY, FRACTURES): VIT D 25 HYDROXY: 16 ng/mL — AB (ref 30–89)

## 2013-12-09 ENCOUNTER — Telehealth: Payer: Self-pay | Admitting: Internal Medicine

## 2013-12-09 NOTE — Telephone Encounter (Signed)
Patient Information:  Caller Name: Leonette MostCharles  Phone: [phone]  Patient: [name] [name], [name] [name]  Gender: Female  DOB: 02/29/1930  Age: 80 Years  PCP: Birdie SonsSwords, Bruce (Adults only, leaving end of July 2015)  Office Follow Up:  Does the office need to follow up with this patient?: No  Instructions For The Office: N/A   Symptoms  Reason For Call & Symptoms: Husband regarding cough, increased fatigue, and occassional SOB; none now. SOB occurs with ambulation. No colored sputum. No fever.  Reviewed Health History In EMR: Yes  Reviewed Medications In EMR: Yes  Reviewed Allergies In EMR: Yes  Reviewed Surgeries / Procedures: Yes  Date of Onset of Symptoms: 12/04/2013  Treatments Tried: Robitussin-slightly helpful  Treatments Tried Worked: Yes  Guideline(s) Used:  Cough  Disposition Per Guideline:   See Today or Tomorrow in Office  Reason For Disposition Reached:   Continuous (nonstop) coughing interferes with work or school and no improvement using cough treatment per Care Advice  Advice Given:  Reassurance  Coughing is the way that our lungs remove irritants and mucus. It helps protect our lungs from getting pneumonia.  Here is some care advice that should help.  Expected Course:   The expected course depends on what is causing the cough.  Call Back If:  Difficulty breathing  Cough lasts more than 3 weeks  Fever lasts > 3 days  You become worse.  Patient Will Follow Care Advice:  YES  Appointment Scheduled:  12/10/2013 11:00:00 Appointment Scheduled Provider:  [name], [facility](Family Practice)

## 2013-12-09 NOTE — Telephone Encounter (Signed)
Noted  

## 2013-12-10 ENCOUNTER — Encounter: Payer: Self-pay | Admitting: Family Medicine

## 2013-12-10 ENCOUNTER — Ambulatory Visit (INDEPENDENT_AMBULATORY_CARE_PROVIDER_SITE_OTHER): Payer: Medicare PPO | Admitting: Family Medicine

## 2013-12-10 VITALS — BP 130/69 | HR 90 | Temp 97.6°F

## 2013-12-10 DIAGNOSIS — R0602 Shortness of breath: Secondary | ICD-10-CM

## 2013-12-10 MED ORDER — FUROSEMIDE 40 MG PO TABS: 40.0000 mg | ORAL_TABLET | Freq: Every day | ORAL | Status: DC

## 2013-12-10 NOTE — Progress Notes (Signed)
Pre visit review using our clinic review tool, if applicable. No additional management support is needed unless otherwise documented below in the visit note. 

## 2013-12-10 NOTE — Progress Notes (Signed)
   Subjective:    Patient ID: [name] [name], female    DOB: 12/15/1930, 80 y.o.   MRN: [id number]  HPI 80 yr old female with her husband complaining of a non-productive cough for the past week and of SOB with minimal exertion for the past few months. She describes trying to walk from her room at [employer] down the hall to [employer], and she has to stop to catch her breath. No palpitations or chest pains. She was a long time smoker but quit 20 years ago. She denies any swelling in the hands or feet. She has no other URI sx, no fever or PND or ST. She took Robitussin but this did not help the cough. She mentioned chronic fatigue when she was here in February and complete lab work was done. The only abnormality found was a low vitamin D level and this is being treated.    Review of Systems  Constitutional: Positive for fatigue. Negative for fever, activity change, appetite change and unexpected weight change.  HENT: Negative.   Eyes: Negative.   Respiratory: Positive for cough and shortness of breath. Negative for choking and wheezing.   Cardiovascular: Negative.   Neurological: Negative.        Objective:   Physical Exam  Constitutional: She appears well-developed and well-nourished.  Walks slowly with a cane   Neck: Neck supple. No thyromegaly present.  Cardiovascular: Normal rate, normal heart sounds and intact distal pulses.   No murmur heard. Very irregular rhythm on exam, however the EKG was normal with sinus rhythm  Pulmonary/Chest: Effort normal. No respiratory distress. She has no wheezes.  She has rales at both bases to 1/3 of the way up   Musculoskeletal: She exhibits no edema.  Lymphadenopathy:    She has no cervical adenopathy.          Assessment & Plan:  She is showing signs of CHF, and she may be having paroxysmal atrial fibrillation. She is stable now. We will start her on Lasix 40 mg daily and refer to Cardiology to be seen this week.

## 2013-12-27 ENCOUNTER — Encounter: Payer: Self-pay | Admitting: Cardiology

## 2013-12-27 ENCOUNTER — Ambulatory Visit (INDEPENDENT_AMBULATORY_CARE_PROVIDER_SITE_OTHER)
Admission: RE | Admit: 2013-12-27 | Discharge: 2013-12-27 | Disposition: A | Discharge status: Home / Self Care | Payer: Medicare PPO | Source: Ambulatory Visit | Attending: Cardiology | Admitting: Cardiology

## 2013-12-27 ENCOUNTER — Ambulatory Visit (INDEPENDENT_AMBULATORY_CARE_PROVIDER_SITE_OTHER): Payer: Medicare PPO | Admitting: Cardiology

## 2013-12-27 VITALS — BP 140/70 | HR 86 | Ht 64.0 in | Wt 151.8 lb

## 2013-12-27 DIAGNOSIS — E559 Vitamin D deficiency, unspecified: Secondary | ICD-10-CM

## 2013-12-27 DIAGNOSIS — E785 Hyperlipidemia, unspecified: Secondary | ICD-10-CM

## 2013-12-27 DIAGNOSIS — R0602 Shortness of breath: Secondary | ICD-10-CM

## 2013-12-27 DIAGNOSIS — R579 Shock, unspecified: Secondary | ICD-10-CM

## 2013-12-27 DIAGNOSIS — R578 Other shock: Secondary | ICD-10-CM

## 2013-12-27 NOTE — Progress Notes (Signed)
[address] [place], [place]  [address] Phone: [phone] Fax:  [phone]  Date:  12/27/2013   ID:  [name] MirzaGloria G [name], DOB 08/12/1930, MRN [id number]  PCP:  Judie PetitSWORDS,BRUCE HENRY, MD   History of Present Illness: [name] [name] is a 80 y.o. female here for evaluation of dyspnea on exertion, potential heart failure. She lives at [facility] and has noted increasing dyspnea when going to the dining hall. Her apartment is on the opposite side of the complex. She has to stop a few times and often does not wish to go to the dining room because of her shortness of breath. She has noted this is been going on over the past several months. She has not had any fevers but she does have productive mucus at times. Her cough seems to be "deep. She does not have any chest discomfort associated with her exertion. She ambulates with a cane and often will use a transportation wheelchair. Her mobility is quite limited at baseline. Married, her husband is with her and her daughter is also with her today.  She is had no prior heart history. She used to smoke for several years, "I love my [place] slims ". She has had no prior strokes.   Wt Readings from Last 3 Encounters:  03/04/13 154 lb (69.854 kg)  05/03/11 145 lb (65.772 kg)  12/28/10 144 lb 8 oz (65.545 kg)     Past Medical History  Diagnosis Date  . Vertebral fracture   . Depression   . Hyperlipidemia   . Hypertension   . Osteoporosis   . OSA (obstructive sleep apnea)   . PMR (polymyalgia rheumatica)   . Postherpetic neuralgia   . Diverticulosis of colon   . History of alcohol abuse   . Vitamin D deficiency   . Spinal stenosis   . Anemia     Past Surgical History  Procedure Laterality Date  . Rotator cuff repair    . Breast biopsy    . Vertebroplasty      x 3  . Appendectomy    . Cataract extraction    . Knee arthroscopy  10/2007 and 10/2010    left  . Dilation and curettage of uterus      x several    Current  Outpatient Prescriptions  Medication Sig Dispense Refill  . aspirin 81 MG EC tablet Take 81 mg by mouth daily.      . furosemide (LASIX) 40 MG tablet Take 1 tablet (40 mg total) by mouth daily. 30 tablet 3  . Vitamin D, Ergocalciferol, (DRISDOL) 50000 UNITS CAPS capsule Take 1 capsule (50,000 Units total) by mouth every 7 (seven) days. 30 capsule 1   No current facility-administered medications for this visit.    Allergies:    Allergies  Allergen Reactions  . Celecoxib     REACTION: unspecified  . Clindamycin Hcl     REACTION: diarrhea  . Rofecoxib     REACTION: unspecified  . Sertraline Hcl     REACTION: unspecified    Social History:  The patient  reports that she quit smoking about 25 years ago. She has never used smokeless tobacco. She reports that she drinks alcohol. She reports that she does not use illicit drugs.   Family History  Problem Relation Age of Onset  . Stroke Father [id number]  . Heart attack Father   . Alcohol abuse Mother   . Coronary artery disease Father [id number]  .  Breast cancer Sister   . Heart disease Sister   . Colon cancer Neg Hx     ROS:  Please see the history of present illness.   Denies any fevers, chills, orthopnea, PND. Positive cough, mucus production, dyspnea. No syncope, no rashes, no blood loss   All other systems reviewed and negative.   PHYSICAL EXAM: VS:  BP 140/70 mmHg  Pulse 86 Well nourished, well developed, in no acute distressElderly, walks with a cane. HEENT: normal, [place]/AT, EOMI Neck: no JVD, normal carotid upstroke, no bruit Cardiac:  normal S1, S2; RRR; soft systolic murmurEctopy noted during auscultation, occasional short bursts of tachycardia. Lungs: Mild rhonchi heard bilateral bases upon deep inspiration. No active wheezing  Abd: soft, nontender, no hepatomegaly, no bruits Ext: no edema, 2+ distal pulses Skin: warm and dry GU: deferred Neuro: no focal abnormalities noted, AAO x 3  EKG:  Sinus rhythm with premature atrial  contraction noted as well as paroxysmal atrial tachycardia. No atrial fibrillation. Nonspecific ST-T wave changes.     ASSESSMENT AND PLAN:  1. Dyspnea-could be secondary to heart failure, perhaps diastolic or systolic. Other possible etiologies include chronic bronchitis/COPD given her long-standing tobacco history. She does produce mucus. Lasix has been administered 40 mg once a day and I will continue with this. I will check an echocardiogram to evaluate her structure and function. I will also check a CBC to ensure that she does not have any anemia, basic metabolic profile to check her electrolytes given her fatigue, TSH, BNP to assess fluid status. If BNP is low, this may point towards a more pulmonary etiology. We will have her follow-up in approximately one month. 2. Paroxysmal atrial tachycardia-PACs-noted on EKG. At first, I thought this was atrial fibrillation but it was not. May consider low-dose beta blocker in future. First I want to assess her ejection fraction. Checking TSH. 3. Former smoker-could be playing a role in her dyspnea. Mucus production. 4. One month follow-up  Signed, Donato SchultzMark Skains, MD [facility]  12/27/2013 10:14 AM

## 2013-12-27 NOTE — Patient Instructions (Signed)
The current medical regimen is effective;  continue present plan and medications.  Your physician has requested that you have an echocardiogram. Echocardiography is a painless test that uses sound waves to create images of your heart. It provides your doctor with information about the size and shape of your heart and how well your heart's chambers and valves are working. This procedure takes approximately one hour. There are no restrictions for this procedure.  A chest x-ray takes a picture of the organs and structures inside the chest, including the heart, lungs, and blood vessels. This test can show several things, including, whether the heart is enlarges; whether fluid is building up in the lungs; and whether pacemaker / defibrillator leads are still in place.  Please have bloodwork today. (CBC, BNP, BMP and TSH)  Follow up in 1 month with Dr Anne FuSkains.

## 2013-12-29 LAB — BRAIN NATRIURETIC PEPTIDE: PRO B NATRI PEPTIDE: 151 pg/mL — AB (ref 0.0–100.0)

## 2013-12-29 LAB — CBC WITH DIFFERENTIAL/PLATELET
BASOS ABS: 0 10*3/uL (ref 0.0–0.1)
Basophils Relative: 0.5 % (ref 0.0–3.0)
Eosinophils Absolute: 0 10*3/uL (ref 0.0–0.7)
Eosinophils Relative: 0.6 % (ref 0.0–5.0)
HCT: 30.1 % — ABNORMAL LOW (ref 36.0–46.0)
HEMOGLOBIN: 9 g/dL — AB (ref 12.0–15.0)
Lymphocytes Relative: 21.8 % (ref 12.0–46.0)
Lymphs Abs: 1.4 10*3/uL (ref 0.7–4.0)
MCHC: 30 g/dL (ref 30.0–36.0)
MCV: 82 fl (ref 78.0–100.0)
MONOS PCT: 7.2 % (ref 3.0–12.0)
Monocytes Absolute: 0.5 10*3/uL (ref 0.1–1.0)
NEUTROS ABS: 4.6 10*3/uL (ref 1.4–7.7)
NEUTROS PCT: 69.9 % (ref 43.0–77.0)
Platelets: 393 10*3/uL (ref 150.0–400.0)
RBC: 3.67 Mil/uL — AB (ref 3.87–5.11)
RDW: 22.5 % — ABNORMAL HIGH (ref 11.5–15.5)
WBC: 6.6 10*3/uL (ref 4.0–10.5)

## 2013-12-29 LAB — BASIC METABOLIC PANEL
BUN: 23 mg/dL (ref 6–23)
CALCIUM: 9.8 mg/dL (ref 8.4–10.5)
CO2: 29 mEq/L (ref 19–32)
Chloride: 105 mEq/L (ref 96–112)
Creatinine, Ser: 0.8 mg/dL (ref 0.4–1.2)
GFR: 69.05 mL/min (ref >60.00)
Glucose, Bld: 119 mg/dL — ABNORMAL HIGH (ref 70–99)
POTASSIUM: 3.7 meq/L (ref 3.5–5.1)
SODIUM: 143 meq/L (ref 135–145)

## 2013-12-29 LAB — VITAMIN D 25 HYDROXY (VIT D DEFICIENCY, FRACTURES): VITD: 23.76 ng/mL — ABNORMAL LOW (ref 30.00–100.00)

## 2013-12-29 LAB — TSH: TSH: 6.2 u[IU]/mL — ABNORMAL HIGH (ref 0.35–4.50)

## 2013-12-30 ENCOUNTER — Telehealth: Payer: Self-pay | Admitting: Cardiology

## 2013-12-30 DIAGNOSIS — R9389 Abnormal findings on diagnostic imaging of other specified body structures: Secondary | ICD-10-CM

## 2013-12-30 DIAGNOSIS — R0602 Shortness of breath: Secondary | ICD-10-CM

## 2013-12-30 NOTE — Telephone Encounter (Signed)
Reviewed results with daughter - aware pt will be called to schedule appt with pulmonary and results will bbe sent to her PCP.

## 2013-12-30 NOTE — Telephone Encounter (Signed)
New Message        Pt's daughter calling stating she is calling to find out mother's test results. Please call back and advise.

## 2014-01-01 ENCOUNTER — Ambulatory Visit (INDEPENDENT_AMBULATORY_CARE_PROVIDER_SITE_OTHER): Payer: Medicare PPO | Admitting: Family Medicine

## 2014-01-01 ENCOUNTER — Other Ambulatory Visit (HOSPITAL_COMMUNITY): Payer: Medicare PPO

## 2014-01-01 ENCOUNTER — Encounter: Payer: Self-pay | Admitting: Family Medicine

## 2014-01-01 VITALS — BP 124/62 | HR 111 | Temp 97.8°F

## 2014-01-01 DIAGNOSIS — R0602 Shortness of breath: Secondary | ICD-10-CM

## 2014-01-01 DIAGNOSIS — E559 Vitamin D deficiency, unspecified: Secondary | ICD-10-CM

## 2014-01-01 DIAGNOSIS — R413 Other amnesia: Secondary | ICD-10-CM

## 2014-01-01 DIAGNOSIS — I10 Essential (primary) hypertension: Secondary | ICD-10-CM

## 2014-01-01 DIAGNOSIS — J84112 Idiopathic pulmonary fibrosis: Secondary | ICD-10-CM | POA: Insufficient documentation

## 2014-01-01 DIAGNOSIS — D649 Anemia, unspecified: Secondary | ICD-10-CM | POA: Insufficient documentation

## 2014-01-01 LAB — IRON: Iron: 27 ug/dL — ABNORMAL LOW (ref 42–145)

## 2014-01-01 LAB — FERRITIN: Ferritin: 9.7 ng/mL — ABNORMAL LOW (ref 10.0–291.0)

## 2014-01-01 LAB — VITAMIN B12: VITAMIN B 12: 385 pg/mL (ref 211–911)

## 2014-01-01 LAB — FOLATE: Folate: >24.6 ng/mL (ref >5.9)

## 2014-01-01 MED ORDER — FERROUS SULFATE 324 (65 FE) MG PO TBEC: 1.0000 | DELAYED_RELEASE_TABLET | Freq: Two times a day (BID) | ORAL | Status: DC

## 2014-01-01 MED ORDER — LEVOTHYROXINE SODIUM 75 MCG PO TABS: 75.0000 ug | ORAL_TABLET | Freq: Every day | ORAL | Status: DC

## 2014-01-01 NOTE — Progress Notes (Signed)
Pre visit review using our clinic review tool, if applicable. No additional management support is needed unless otherwise documented below in the visit note. 

## 2014-01-01 NOTE — Progress Notes (Signed)
   Subjective:    Patient ID: [name] [name], female    DOB: 07/06/1930, 80 y.o.   MRN: [id number]  HPI Here to follow up on lab abnormalities after she saw Dr. Anne FuSkains last week. She was referred to him for SOB and possible CHF. She had rales on my exam and we started her on Lasix 40 mg daily. By the time she saw Dr. Anne FuSkains, the rales were apparently gone. He has maintained the Lasix, and she is set up for an ECHO soon. Her CXR revealed possible pulmonary fibrosis, and thisi is not surprising given her smoking history. She will be see by Pulmonary as well. She had labs done last week that showed a number of problems, all of which could be contributing to her fatigue and SOB. She is anemic with a Hgb of 9.0 (this after she had a Hgb of 11.7 only 10 months ago). She has never seen blood in her stools. She had a colonoscopy in 2004 showing diverticulae but no polyps. She also has evidence of hypothyroidism with a TSH of 6.20. She also has a long hx of low vitamin D, and this level came back at 23.76 despite taking 50,000 unit capsules weekly. Today she is here with her husband and daughter, and she feels about the same overall. No chest pains but she gets SOB easily.    Review of Systems  Constitutional: Positive for fatigue. Negative for fever, chills and diaphoresis.  Respiratory: Positive for shortness of breath. Negative for cough, choking, chest tightness and wheezing.   Cardiovascular: Negative.   Gastrointestinal: Negative.   Endocrine: Negative.   Genitourinary: Negative.   Neurological: Negative.        Objective:   Physical Exam  Constitutional: She is oriented to person, place, and time. She appears well-developed and well-nourished. No distress.  Neck: No thyromegaly present.  Cardiovascular: Normal rate, normal heart sounds and intact distal pulses.   She has frequent ectopy   Pulmonary/Chest: Effort normal. No respiratory distress. She has no wheezes.  Soft scattered crackles but  no rales are evident   Musculoskeletal: She exhibits no edema.  Lymphadenopathy:    She has no cervical adenopathy.  Neurological: She is alert and oriented to person, place, and time.          Assessment & Plan:  She seems to have a number of issues we need to work on right now. She will continue her vitamin D supplementation. She will stay on Lasix. She is hypothyroid so we will star her on Synthroid 75 mcg daily and we will recheck a TSH in 90 days. We will work up the anemia by checking iron, folate, and B12 levels. To investigate possible GI blood losses we will have her see Dr. Juanda ChanceBrodie again ASAP. Her ECHO is pending.

## 2014-01-03 ENCOUNTER — Encounter: Payer: Self-pay | Admitting: *Deleted

## 2014-01-06 ENCOUNTER — Other Ambulatory Visit: Payer: Self-pay

## 2014-01-06 ENCOUNTER — Ambulatory Visit (HOSPITAL_COMMUNITY): Payer: Medicare PPO | Attending: Cardiology | Admitting: Radiology

## 2014-01-06 DIAGNOSIS — F1010 Alcohol abuse, uncomplicated: Secondary | ICD-10-CM | POA: Diagnosis not present

## 2014-01-06 DIAGNOSIS — R0602 Shortness of breath: Secondary | ICD-10-CM | POA: Insufficient documentation

## 2014-01-06 DIAGNOSIS — E785 Hyperlipidemia, unspecified: Secondary | ICD-10-CM | POA: Diagnosis not present

## 2014-01-06 DIAGNOSIS — I38 Endocarditis, valve unspecified: Secondary | ICD-10-CM | POA: Insufficient documentation

## 2014-01-06 DIAGNOSIS — G4733 Obstructive sleep apnea (adult) (pediatric): Secondary | ICD-10-CM | POA: Diagnosis not present

## 2014-01-06 DIAGNOSIS — I10 Essential (primary) hypertension: Secondary | ICD-10-CM | POA: Diagnosis not present

## 2014-01-06 NOTE — Progress Notes (Signed)
Echocardiogram performed.  

## 2014-01-20 ENCOUNTER — Encounter: Payer: Self-pay | Admitting: Internal Medicine

## 2014-01-20 ENCOUNTER — Ambulatory Visit (INDEPENDENT_AMBULATORY_CARE_PROVIDER_SITE_OTHER): Payer: Medicare PPO | Admitting: Internal Medicine

## 2014-01-20 VITALS — BP 130/80 | HR 68 | Ht 64.0 in | Wt 152.6 lb

## 2014-01-20 DIAGNOSIS — D509 Iron deficiency anemia, unspecified: Secondary | ICD-10-CM

## 2014-01-20 MED ORDER — MOVIPREP 100 G PO SOLR: 1.0000 | Freq: Once | ORAL | Status: DC

## 2014-01-20 NOTE — Progress Notes (Signed)
[name] MirzaGloria G [name] 09/24/1930 [id number]  Note: This dictation was prepared with Dragon digital system. Any transcriptional errors that result from this procedure are unintentional.   History of Present Illness:  This is an 80 year old white female with iron deficiency anemia. Her hemoglobin is 9.0 MCV 82. Her ferritin of 7, serum iron 27, B12 385. She is asymptomatic. Patient denies heartburn, dysphagia, weight loss and abdominal pain, diarrhea or constipation. She has a history of irritable bowel syndrome and used to be dependent on narcotics for control of back pain but she no longer has back problems. She had a colonoscopy in 2004 which showed diverticulosis. After her office visit April in 2013, she was supposed to have a colonoscopy but never scheduled. She has significant alcohol intake having at least 2-3 glasses of wine a day. She comes with her husband and daughter today. She lives in a retirement community, [place] where she gets 2 full meals a day. She takes 81 mg aspirin a day and she had a recent recent cardiac evaluation which was negative for primary cardiac problems.     Past Medical History  Diagnosis Date  . Vertebral fracture   . Depression   . Hyperlipidemia   . Hypertension   . Osteoporosis   . OSA (obstructive sleep apnea)   . PMR (polymyalgia rheumatica)   . Postherpetic neuralgia   . Diverticulosis of colon   . History of alcohol abuse   . Vitamin D deficiency   . Spinal stenosis   . Anemia   . Irritable bowel syndrome     Past Surgical History  Procedure Laterality Date  . Rotator cuff repair    . Breast biopsy    . Vertebroplasty      x 3  . Appendectomy    . Cataract extraction    . Knee arthroscopy  10/2007 and 10/2010    left  . Dilation and curettage of uterus      x several    Allergies  Allergen Reactions  . Celecoxib     REACTION: unspecified  . Clindamycin Hcl     REACTION: diarrhea  . Rofecoxib     REACTION: unspecified  .  Sertraline Hcl     REACTION: unspecified    Family history and social history have been reviewed.  Review of Systems: Problems with balance. Negative for weight loss. Denies dysphagia abdominal pain  The remainder of the 10 point ROS is negative except as outlined in the H&P  Physical Exam: General Appearance Well developed, in no distress Eyes  Non icteric  HEENT  Non traumatic, normocephalic  Mouth No lesion, tongue papillated, no cheilosis Neck Supple without adenopathy, thyroid not enlarged, no carotid bruits, no JVD, mild kyphosis  Lungs Clear to auscultation bilaterally COR Normal S1, normal S2, regular rhythm,  occasional premature beats no murmur, quiet precordium Abdomen Soft nontender with normoactive bowel sounds. Liver edge at costal margin. No bruit no palpable mass  Rectal Normal perianal area. Normal rectal sphincter tone. Small amount of Hemoccult negative dark stools secondary to iron  Extremities  No pedal edema Skin post herpetic like lesions on the back of the neck, shoulders ,extremities on the left as well as on the right side of the trunk. lesions Neurological Alert and oriented x 3 Psychological Normal mood and affect  Assessment and Plan:   Problem #141 80 year old white female with essentially asymptomatic iron deficiency anemia and Hemoccult negative stool. She is due for a colonoscopy. I suspect a very slow  loss of iron likely via GI tract.She may have a hiatal hernia and Cameron erosions causing low-grade GI blood loss. Colon cancer needs to be ruled out at this time as well. She is currently on iron supplements and will stay on them until colonoscopy which will be scheduled for the near future. We will also complete an upper endoscopy and small bowel biopsies. She will avoid aspirin and she will also reduce her alcohol intake to 6 ounces daily. Further disposition will depend on findings on endoscopic procedures.   [name] [name] 01/20/2014

## 2014-01-20 NOTE — Patient Instructions (Signed)
You have been scheduled for a colonoscopy. Please follow written instructions given to you at your visit today.  Please pick up your prep kit at the pharmacy within the next 1-3 days. If you use inhalers (even only as needed), please bring them with you on the day of your procedure. Your physician has requested that you go to www.startemmi.com and enter the access code given to you at your visit today. This web site gives a general overview about your procedure. However, you should still follow specific instructions given to you by our office regarding your preparation for the procedure.  CC:Dr Sarajane Jews

## 2014-01-29 ENCOUNTER — Telehealth: Payer: Self-pay | Admitting: Family Medicine

## 2014-01-29 DIAGNOSIS — D509 Iron deficiency anemia, unspecified: Secondary | ICD-10-CM

## 2014-01-29 DIAGNOSIS — E038 Other specified hypothyroidism: Secondary | ICD-10-CM

## 2014-01-29 DIAGNOSIS — E559 Vitamin D deficiency, unspecified: Secondary | ICD-10-CM

## 2014-01-29 NOTE — Telephone Encounter (Signed)
Per dr fry's note "She is hypothyroid so we will star her on Synthroid 75 mcg daily and we will recheck a TSH in 90 days", can you put the order in? I have scheduled the pt. Daughter states pt is also taking vit d and iron. Can we check that as well?.Marland Kitchen

## 2014-01-29 NOTE — Telephone Encounter (Signed)
Orders were placed. She can make a lab appt

## 2014-01-31 ENCOUNTER — Other Ambulatory Visit: Payer: Medicare PPO

## 2014-01-31 ENCOUNTER — Ambulatory Visit (INDEPENDENT_AMBULATORY_CARE_PROVIDER_SITE_OTHER): Payer: Medicare PPO | Admitting: Pulmonary Disease

## 2014-01-31 ENCOUNTER — Encounter: Payer: Self-pay | Admitting: Pulmonary Disease

## 2014-01-31 ENCOUNTER — Other Ambulatory Visit (INDEPENDENT_AMBULATORY_CARE_PROVIDER_SITE_OTHER): Payer: Medicare PPO

## 2014-01-31 VITALS — BP 132/66 | HR 81 | Ht 64.0 in | Wt 156.0 lb

## 2014-01-31 DIAGNOSIS — D500 Iron deficiency anemia secondary to blood loss (chronic): Secondary | ICD-10-CM

## 2014-01-31 DIAGNOSIS — R0602 Shortness of breath: Secondary | ICD-10-CM

## 2014-01-31 DIAGNOSIS — J849 Interstitial pulmonary disease, unspecified: Secondary | ICD-10-CM

## 2014-01-31 LAB — SEDIMENTATION RATE: Sed Rate: 63 mm/hr — ABNORMAL HIGH (ref 0–22)

## 2014-01-31 NOTE — Patient Instructions (Addendum)
We will set up a breathing test and a CT scan We will call you with the results of the blood work from today We will you back the week of January 18

## 2014-01-31 NOTE — Assessment & Plan Note (Addendum)
I explained to [name] [name] and her daughter today in clinic that the differential diagnosis of shortness of breath is broad. However this time the objective evidence shows [name] [name] that she has crackles on her lung exam, normal oximetry, no evidence of leg swelling and a normal echocardiogram. The chest x-ray showed increased reticular opacities in the lungs bilaterally which is most suggestive of pulmonary fibrosis. I explained to them that the differential diagnosis of pulmonary fibrosis is broad and includes associated inflammatory conditions contributing to nonspecific interstitial pneumonitis versus immune her fibrosis versus less likely aspiration or other rarer conditions.  For her particular case considering her demographic I am concerned that she may have usual interstitial pneumonitis which is synonymous with idiopathic pulmonary fibrosis. She does not have evidence of an underlying connective tissue disease based on history and physical exam.  Based on the fact that she can only walk about 50 feet without stopping suggest that she does have severe shortness of breath and respiratory limitation.  I have reviewed the records from Dr. Jayme [name] visits and her visits with her primary care physician. It does not appear that she has evidence of significant heart disease.  Plan: -Obtain full pulmonary function testing -Obtain high-resolution CT chest -Obtain serology panel to look for evidence of connective tissue disease -Follow-up in one week and discuss these results.

## 2014-01-31 NOTE — Assessment & Plan Note (Signed)
She has iron deficiency anemia of uncertain cause. I agree with the plan to perform an endoscopy and a colonoscopy. Based on what I find today in clinic I do not think she is at excessive risk for these. I explained to she and her daughter that the anemia is contributing to her shortness of breath.

## 2014-01-31 NOTE — Progress Notes (Signed)
Subjective:    Patient ID: [name] [name], female    DOB: 22-Sep-1930, 81 y.o.   MRN: [id number]  HPI Chief Complaint  Patient presents with  . Advice Only    Referred for abn. cxr. Pt has no breathing complaints but daughter states she does a lot of pursed lip breathing.    [name] [name] is here to see me because a chest x-ray was abnormal ordered by her PCP and she was referred by Dr. Etter [name].    She says that in the fall she was having chest congestion which they initially though was just a cold but it kept getting worse so they went to the PCP.  Apparently the first impression wsa that she could have CHF so she saw Dr. Etter [name] in early December who did not find clear evidence of significant lung disease so he ordered a chest x-ray.   [name] [name] and her daughter (who provides most of the history) say that the cough has improved somewhat but dyspnea has persisted somewhat.  Her daughter has noted ongoing pursed lip breathing but she seems short of breath with minimal walking.  She has to walk quite a long way for dinner and she has to stop a few times while walking.    Prior to hearing about an abnormal CXR a few years ago she had never been told that she had a lung problem.  She had pneumonia three times as a child and was hospitalized, but she had no lasting effects.  She smoked cigarettes from age 89 through age 70, then started again at age 64 and smoked again until age 58.  She smoked one pack per day.  As an adult she has had bronchitis several times as an adult, the last was more than ten or fifteen years ago probably.  No pneumonia as an adult.   She worked in the Radio broadcast assistant at [employer], she never worked in [employer].    She has a history of a rash which is been described as "dermatitis". She denies trouble swallowing she denies problems with joint redness or swelling.  Past Medical History  Diagnosis Date  . Vertebral fracture   . Depression   . Hyperlipidemia   . Hypertension   .  Osteoporosis   . OSA (obstructive sleep apnea)   . PMR (polymyalgia rheumatica)   . Postherpetic neuralgia   . Diverticulosis of colon   . History of alcohol abuse   . Vitamin D deficiency   . Spinal stenosis   . Anemia   . Irritable bowel syndrome      Family History  Problem Relation Age of Onset  . Stroke Father 54  . Heart attack Father   . Alcohol abuse Mother   . Coronary artery disease Father 40  . Breast cancer Sister   . Heart disease Sister   . Colon cancer Neg Hx      History   Social History  . Marital Status: Married    Spouse Name: N/A    Number of Children: N/A  . Years of Education: N/A   Occupational History  . Not on file.   Social History Main Topics  . Smoking status: Former Smoker -- 1.00 packs/day for 20 years    Types: Cigarettes    Quit date: 01/25/1988  . Smokeless tobacco: Never Used  . Alcohol Use: 0.0 oz/week    0 Not specified per week     Comment: 2 glasses every day  . Drug Use: No  .  Sexual Activity: Not on file   Other Topics Concern  . Not on file   Social History Narrative     Allergies  Allergen Reactions  . Celecoxib     REACTION: unspecified  . Clindamycin Hcl     REACTION: diarrhea  . Rofecoxib     REACTION: unspecified  . Sertraline Hcl     REACTION: unspecified     Outpatient Prescriptions Prior to Visit  Medication Sig Dispense Refill  . ferrous sulfate 324 (65 FE) MG TBEC Take 1 tablet (325 mg total) by mouth 2 (two) times daily. 60 tablet 11  . furosemide (LASIX) 40 MG tablet Take 1 tablet (40 mg total) by mouth daily. 30 tablet 3  . levothyroxine (SYNTHROID, LEVOTHROID) 75 MCG tablet Take 1 tablet (75 mcg total) by mouth daily. 30 tablet 5  . Vitamin D, Ergocalciferol, (DRISDOL) 50000 UNITS CAPS capsule Take 1 capsule (50,000 Units total) by mouth every 7 (seven) days. 30 capsule 1  . MOVIPREP 100 G SOLR Take 1 kit (200 g total) by mouth once. (Patient not taking: Reported on 01/31/2014) 1 kit 0  .  aspirin 81 MG EC tablet Take 81 mg by mouth daily.       No facility-administered medications prior to visit.       Review of Systems  Constitutional: Positive for fatigue. Negative for fever and unexpected weight change.  HENT: Negative for congestion, dental problem, ear pain, nosebleeds, postnasal drip, rhinorrhea, sinus pressure, sneezing, sore throat and trouble swallowing.   Eyes: Negative for redness and itching.  Respiratory: Positive for shortness of breath. Negative for cough, chest tightness and wheezing.   Cardiovascular: Negative for palpitations and leg swelling.  Gastrointestinal: Negative for nausea and vomiting.  Genitourinary: Negative for dysuria.  Musculoskeletal: Negative for joint swelling.  Skin: Negative for rash.  Neurological: Negative for headaches.  Hematological: Does not bruise/bleed easily.  Psychiatric/Behavioral: Negative for dysphoric mood. The patient is not nervous/anxious.        Objective:   Physical Exam Filed Vitals:   01/31/14 1112  BP: 132/66  Pulse: 81  Height: $Remove'5\' 4"'KafnYHl$  (1.626 m)  Weight: 156 lb (70.761 kg)  SpO2: 97%   Room air  Ambulated 200 feet on room air and her O2 saturation remained above 93%  Gen: elderly, no acute distress HEENT: NCAT, PERRL, EOMi, OP clear, neck supple without masses PULM: Coarse crackles in bases of lungs CV: RRR with frequent interrupted beats, no mgr, no JVD AB: BS+, soft, nontender, no hsm Ext: warm, no edema, no clubbing, no cyanosis Derm: excoriated rash over shoulders or skin breakdown Neuro: A&Ox4, CN II-XII intact, strength 5/5 in all 4 extremities   January 2016 chest x-ray images reviewed> there is nodular in interstitial scarring in the peripheral aspect of both lungs left greater than right December 2015 echocardiogram> LVEF is normal, no significant valvular disease RV is unremarkable     Assessment & Plan:   SOB (shortness of breath) on exertion I explained to [name] [name] and her  daughter today in clinic that the differential diagnosis of shortness of breath is broad. However this time the objective evidence shows [place] that she has crackles on her lung exam, normal oximetry, no evidence of leg swelling and a normal echocardiogram. The chest x-ray showed increased reticular opacities in the lungs bilaterally which is most suggestive of pulmonary fibrosis. I explained to them that the differential diagnosis of pulmonary fibrosis is broad and includes associated inflammatory conditions contributing to nonspecific interstitial  pneumonitis versus immune her fibrosis versus less likely aspiration or other rarer conditions.  For her particular case considering her demographic I am concerned that she may have usual interstitial pneumonitis which is synonymous with idiopathic pulmonary fibrosis. She does not have evidence of an underlying connective tissue disease based on history and physical exam.  Based on the fact that she can only walk about 50 feet without stopping suggest that she does have severe shortness of breath and respiratory limitation.  I have reviewed the records from Dr. Gypsy Balsam visits and her visits with her primary care physician. It does not appear that she has evidence of significant heart disease.  Plan: -Obtain full pulmonary function testing -Obtain high-resolution CT chest -Obtain serology panel to look for evidence of connective tissue disease -Follow-up in one week and discuss these results.    Absolute anemia She has iron deficiency anemia of uncertain cause. I agree with the plan to perform an endoscopy and a colonoscopy. Based on what I find today in clinic I do not think she is at excessive risk for these. I explained to she and her daughter that the anemia is contributing to her shortness of breath.    Updated Medication List Outpatient Encounter Prescriptions as of 01/31/2014  Medication Sig  . ferrous sulfate 324 (65 FE) MG TBEC Take 1 tablet (325  mg total) by mouth 2 (two) times daily.  . furosemide (LASIX) 40 MG tablet Take 1 tablet (40 mg total) by mouth daily.  Marland Kitchen levothyroxine (SYNTHROID, LEVOTHROID) 75 MCG tablet Take 1 tablet (75 mcg total) by mouth daily.  . Vitamin D, Ergocalciferol, (DRISDOL) 50000 UNITS CAPS capsule Take 1 capsule (50,000 Units total) by mouth every 7 (seven) days.  Marland Kitchen MOVIPREP 100 G SOLR Take 1 kit (200 g total) by mouth once. (Patient not taking: Reported on 01/31/2014)  . [DISCONTINUED] aspirin 81 MG EC tablet Take 81 mg by mouth daily.

## 2014-02-01 LAB — RHEUMATOID FACTOR: Rhuematoid fact SerPl-aCnc: <10 IU/mL (ref <=14)

## 2014-02-03 ENCOUNTER — Telehealth: Payer: Self-pay | Admitting: Family Medicine

## 2014-02-03 LAB — ANA: Anti Nuclear Antibody(ANA): NEGATIVE

## 2014-02-03 LAB — CENTROMERE ANTIBODIES: CENTROMERE AB SCREEN: NEGATIVE

## 2014-02-03 LAB — ALDOLASE: ALDOLASE: 4.2 U/L (ref <=8.1)

## 2014-02-03 NOTE — Progress Notes (Signed)
Quick Note:  Pt aware of results. ______ 

## 2014-02-03 NOTE — Telephone Encounter (Signed)
Can you let me know what labs we need to order? I spoke with family member and they will schedule to have labs drawn here.

## 2014-02-03 NOTE — Telephone Encounter (Signed)
Pt was instructed to Recheck a Hgb in 2 weeks.from her 12/09 appt. [facility] Friday, [facility].  Daughter states it is so hard to get pt around , can she go to the [facility] while [facility] are [facility] Friday and have her iron checked.  Is there any way pt can do at [facility]? Pt is having ct scan and another test done ordered by pulmonary.

## 2014-02-04 LAB — ANTI-JO 1 ANTIBODY, IGG

## 2014-02-04 LAB — ANTI-SCLERODERMA ANTIBODY: SCLERODERMA (SCL-70) (ENA) ANTIBODY, IGG: NEGATIVE

## 2014-02-04 LAB — SJOGRENS SYNDROME-B EXTRACTABLE NUCLEAR ANTIBODY: SSB (La) (ENA) Antibody, IgG: <1

## 2014-02-04 LAB — SJOGRENS SYNDROME-A EXTRACTABLE NUCLEAR ANTIBODY: SSA (RO) (ENA) ANTIBODY, IGG: NEGATIVE

## 2014-02-04 LAB — CYCLIC CITRUL PEPTIDE ANTIBODY, IGG: Cyclic Citrullin Peptide Ab: <2 U/mL (ref 0.0–5.0)

## 2014-02-04 NOTE — Telephone Encounter (Signed)
Please arrange for her to have a CBC drawn at [facility]

## 2014-02-05 ENCOUNTER — Other Ambulatory Visit: Payer: Medicare PPO

## 2014-02-05 NOTE — Telephone Encounter (Signed)
I spoke with [name] [name] and she is going to schedule pt for lab appointment next week.

## 2014-02-07 ENCOUNTER — Ambulatory Visit (HOSPITAL_COMMUNITY)
Admission: RE | Admit: 2014-02-07 | Discharge: 2014-02-07 | Disposition: A | Discharge status: Home / Self Care | Payer: Medicare PPO | Source: Ambulatory Visit | Attending: Pulmonary Disease | Admitting: Pulmonary Disease

## 2014-02-07 DIAGNOSIS — Z87891 Personal history of nicotine dependence: Secondary | ICD-10-CM | POA: Insufficient documentation

## 2014-02-07 DIAGNOSIS — J849 Interstitial pulmonary disease, unspecified: Secondary | ICD-10-CM | POA: Insufficient documentation

## 2014-02-07 LAB — PULMONARY FUNCTION TEST
DL/VA % PRED: 66 %
DL/VA: 3.21 ml/min/mmHg/L
DLCO COR: 9.59 ml/min/mmHg
DLCO UNC % PRED: 32 %
DLCO cor % pred: 39 %
DLCO unc: 7.98 ml/min/mmHg
FEF 25-75 POST: 2.61 L/s
FEF 25-75 Pre: 1.86 L/sec
FEF2575-%Change-Post: 40 %
FEF2575-%PRED-POST: 197 %
FEF2575-%PRED-PRE: 140 %
FEV1-%CHANGE-POST: 10 %
FEV1-%PRED-POST: 90 %
FEV1-%Pred-Pre: 81 %
FEV1-POST: 1.69 L
FEV1-Pre: 1.54 L
FEV1FVC-%Change-Post: 2 %
FEV1FVC-%PRED-PRE: 115 %
FEV6-%Change-Post: 7 %
FEV6-%PRED-PRE: 76 %
FEV6-%Pred-Post: 81 %
FEV6-POST: 1.95 L
FEV6-Pre: 1.82 L
FEV6FVC-%PRED-POST: 106 %
FEV6FVC-%PRED-PRE: 106 %
FVC-%CHANGE-POST: 7 %
FVC-%PRED-PRE: 71 %
FVC-%Pred-Post: 77 %
FVC-POST: 1.95 L
FVC-Pre: 1.82 L
POST FEV1/FVC RATIO: 87 %
PRE FEV1/FVC RATIO: 84 %
Post FEV6/FVC ratio: 100 %
Pre FEV6/FVC Ratio: 100 %
RV % pred: 57 %
RV: 1.4 L
TLC % PRED: 66 %
TLC: 3.36 L

## 2014-02-07 MED ORDER — ALBUTEROL SULFATE (2.5 MG/3ML) 0.083% IN NEBU
2.5000 mg | INHALATION_SOLUTION | Freq: Once | RESPIRATORY_TRACT | Status: AC
  Administered 2014-02-07: 2.5 mg via RESPIRATORY_TRACT

## 2014-02-11 ENCOUNTER — Encounter: Payer: Self-pay | Admitting: Gastroenterology

## 2014-02-11 ENCOUNTER — Ambulatory Visit: Payer: Medicare PPO | Admitting: Cardiology

## 2014-02-11 NOTE — Progress Notes (Signed)
Quick Note:  [id number] ______ 

## 2014-02-13 ENCOUNTER — Encounter: Payer: Self-pay | Admitting: Cardiology

## 2014-02-13 ENCOUNTER — Ambulatory Visit (INDEPENDENT_AMBULATORY_CARE_PROVIDER_SITE_OTHER): Payer: Medicare PPO | Admitting: Cardiology

## 2014-02-13 ENCOUNTER — Ambulatory Visit (INDEPENDENT_AMBULATORY_CARE_PROVIDER_SITE_OTHER): Payer: Medicare PPO | Admitting: Pulmonary Disease

## 2014-02-13 ENCOUNTER — Encounter: Payer: Self-pay | Admitting: Pulmonary Disease

## 2014-02-13 VITALS — BP 118/58 | HR 93 | Temp 97.9°F | Ht 67.0 in | Wt 153.8 lb

## 2014-02-13 VITALS — BP 122/68 | HR 72 | Ht 64.0 in | Wt 152.0 lb

## 2014-02-13 DIAGNOSIS — I10 Essential (primary) hypertension: Secondary | ICD-10-CM

## 2014-02-13 DIAGNOSIS — R0602 Shortness of breath: Secondary | ICD-10-CM

## 2014-02-13 DIAGNOSIS — J84112 Idiopathic pulmonary fibrosis: Secondary | ICD-10-CM

## 2014-02-13 DIAGNOSIS — D500 Iron deficiency anemia secondary to blood loss (chronic): Secondary | ICD-10-CM

## 2014-02-13 NOTE — Patient Instructions (Signed)
You have IPF (idiopathic pulmonary fibrosis)  This can be treated by medications named Ofev or Esbriet that only slow the progression of the disease, but don't make it better Read the literature I provided you Let me know if you want to start taking one of the medications We will see you back in 3 months or sooner if needed

## 2014-02-13 NOTE — Progress Notes (Signed)
Subjective:    Patient ID: [name] [name], female    DOB: 04-18-1930, 81 y.o.   MRN: [id number]  Synopsis: Referred in 2016 for IPF January 2016 chest x-ray> bilateral peripheral-based interstitial and nodular opacities, cardiomegaly 01/2014 ANA, RNP, SSA, SSB, Aldolase, CCP, ESR all negative January 2016 pulmonary function test> ratio 87%, FEV1 1.69 L (90% predicted, 10% change), total lung capacity 3.36 L (66% predicted), DLCO 7.98 (32% predicted) January 2016 high resolution CT chest> interlobular septal thickening, peripheral honeycombing, and variagated pattern of interstitial disease most specifically in the periphery and worse in the bases, overall picture consistent with usual interstitial pneumonitis. There is mild mediastinal lymphadenopathy favored to be reactive to the interstitial lung disease. Hiatal hernia, 3 vessel CAD.  HPI Chief Complaint  Patient presents with  . Follow-up    pt says she has been doing very well lately; discuss CT/ PFT results.      [name] has been doing OK and reports no changes since she saw me the last time.  She had her PFT and CT scan last week.  Her breathing has been stable.  No new problems.  She denies a new cough or chest pain.  She is supposed to have an endoscopy in the next few weeks.  They are here to discuss the results of her recent pulmonary function testing and CT scan.  Past Medical History  Diagnosis Date  . Vertebral fracture   . Depression   . Hyperlipidemia   . Hypertension   . Osteoporosis   . OSA (obstructive sleep apnea)   . PMR (polymyalgia rheumatica)   . Postherpetic neuralgia   . Diverticulosis of colon   . History of alcohol abuse   . Vitamin D deficiency   . Spinal stenosis   . Anemia   . Irritable bowel syndrome       Review of Systems  Constitutional: Positive for fatigue. Negative for fever and chills.  HENT: Negative for postnasal drip, rhinorrhea and sinus pressure.   Respiratory: Positive for shortness  of breath. Negative for cough and stridor.   Cardiovascular: Negative for chest pain, palpitations and leg swelling.       Objective:   Physical Exam  Filed Vitals:   02/13/14 1419  BP: 118/58  Pulse: 93  Temp: 97.9 F (36.6 C)  TempSrc: Oral  Height: $Remove'5\' 7"'RKSMdsD$  (1.702 m)  Weight: 153 lb 12.8 oz (69.763 kg)  SpO2: 98%   RA  Gen: chronically ill appearing, no acute distress HEENT: NCAT, PERRL, EOMi, OP clear, neck supple without masses PULM: Crackles in bases CV: RRR, no mgr, no JVD AB: BS+, soft, nontender Ext: warm, no edema, no clubbing, no cyanosis Derm: no rash or skin breakdown Neuro: A&Ox4, MAEW  01/2014 CT chest images reviewed in clinic today     Assessment & Plan:   IPF (idiopathic pulmonary fibrosis) Today I had a lengthy conversation with Mata and her daughter regarding her diagnosis of idiopathic pulmonary fibrosis. Based on the usual interstitial pneumonitis seen on chest imaging, the lack of evidence of a connective tissue disease, and her age this picture fits perfectly with the diagnosis of IPF.  Today we talked about the fact that IPF is a progressive condition with no cure. We also talked about the fact that it can be treated with anti-fibrotic therapy that does not improve symptoms but does slow the progression of disease. We talked about the two agents which are available, Ofev and Esbriet.  We also discussed the  risks of these medications including diarrhea and GI upset.  Whether or not Kaela should start using these medications is uncertain. It sounds as if she is not very active and her life is already complicated by other medical problems and what seems to be early dementia.  Today I provided them with information about the diagnosis of idiopathic fibrosis as well as about treatment. They are going to consider this and then get back with me.   Her IPF should not interfere with her upcoming endoscopy.  Though she will be a mild risk for a complication  she should proceed with this procedure.  Plan: -Follow-up 3 months  -if they decide to start on one of the anti-fibrotic therapies between now and the next visit we will arrange that  > 25 minutes spent in direct consultation with patient and daughter     Updated Medication List Outpatient Encounter Prescriptions as of 02/13/2014  Medication Sig  . ferrous sulfate 324 (65 FE) MG TBEC Take 1 tablet (325 mg total) by mouth 2 (two) times daily.  . furosemide (LASIX) 40 MG tablet Take 1 tablet (40 mg total) by mouth daily.  Marland Kitchen levothyroxine (SYNTHROID, LEVOTHROID) 75 MCG tablet Take 1 tablet (75 mcg total) by mouth daily.  Marland Kitchen MOVIPREP 100 G SOLR Take 1 kit (200 g total) by mouth once.  . Vitamin D, Ergocalciferol, (DRISDOL) 50000 UNITS CAPS capsule Take 1 capsule (50,000 Units total) by mouth every 7 (seven) days.

## 2014-02-13 NOTE — Assessment & Plan Note (Addendum)
Today I had a lengthy conversation with Malachi BondsGloria and her daughter regarding her diagnosis of idiopathic pulmonary fibrosis. Based on the usual interstitial pneumonitis seen on chest imaging, the lack of evidence of a connective tissue disease, and her age this picture fits perfectly with the diagnosis of IPF.  Today we talked about the fact that IPF is a progressive condition with no cure. We also talked about the fact that it can be treated with anti-fibrotic therapy that does not improve symptoms but does slow the progression of disease. We talked about the two agents which are available, Ofev and Esbriet.  We also discussed the risks of these medications including diarrhea and GI upset.  Whether or not Malachi BondsGloria should start using these medications is uncertain. It sounds as if she is not very active and her life is already complicated by other medical problems and what seems to be early dementia.  Today I provided them with information about the diagnosis of idiopathic fibrosis as well as about treatment. They are going to consider this and then get back with me.   Her IPF should not interfere with her upcoming endoscopy.  Though she will be a mild risk for a complication she should proceed with this procedure.  Plan: -Follow-up 3 months  -if they decide to start on one of the anti-fibrotic therapies between now and the next visit we will arrange that  > 25 minutes spent in direct consultation with patient and daughter

## 2014-02-13 NOTE — Progress Notes (Signed)
[place]. [address], [place]  [address] Phone: [phone] Fax:  [phone]  Date:  02/13/2014   ID:  [name], DOB 04-02-1930, MRN [id number]  PCP:  Laurey Morale, MD   History of Present Illness: [name] [name] is a 81 y.o. female here for follow up of dyspnea on exertion. ECHO 2016 with normal EF. Hg low at 9 from 11.7. TSH mildly elevated 6.2.  Pulmonary studies point toward interstial lung disease.  She lives at [employer] and has noted increasing dyspnea when going to [employer]. Her apartment is on the opposite side of the complex. She has to stop a few times and often does not wish to go to the dining room because of her shortness of breath. She has noted this is been going on over the past several months. She has not had any fevers but she does have productive mucus at times. Her cough seems to be "deep. She does not have any chest discomfort associated with her exertion. She ambulates with a cane and often will use a transportation wheelchair. Her mobility is quite limited at baseline. Married, her husband is with her and her daughter is also with her today.  She is had no prior heart history. She used to smoke for several years, "I love my [place] ". She has had no prior strokes.  Cardiac workup thus far has been reassuring with normal ejection fraction. Enid Derry she does have a degree of diastolic dysfunction and I do believe that the Lasix may be helping. Her BNP was only minimally elevated.  He has seen Dr. Maurene Capes with gastroenterology-endoscopy planned. She may go forward with this from a cardiac perspective. She has also been started on low-dose Synthroid. TSH was slightly elevated. Dr. Lake Bells is also seeing her with pulmonary medicine for what appears to be interstitial lung disease.   Wt Readings from Last 3 Encounters:  02/13/14 152 lb (68.947 kg)  01/31/14 156 lb (70.761 kg)  01/20/14 152 lb 9.6 oz (69.219 kg)     Past Medical  History  Diagnosis Date  . Vertebral fracture   . Depression   . Hyperlipidemia   . Hypertension   . Osteoporosis   . OSA (obstructive sleep apnea)   . PMR (polymyalgia rheumatica)   . Postherpetic neuralgia   . Diverticulosis of colon   . History of alcohol abuse   . Vitamin D deficiency   . Spinal stenosis   . Anemia   . Irritable bowel syndrome     Past Surgical History  Procedure Laterality Date  . Rotator cuff repair    . Breast biopsy    . Vertebroplasty      x 3  . Appendectomy    . Cataract extraction    . Knee arthroscopy  10/2007 and 10/2010    left  . Dilation and curettage of uterus      x several    Current Outpatient Prescriptions  Medication Sig Dispense Refill  . ferrous sulfate 324 (65 FE) MG TBEC Take 1 tablet (325 mg total) by mouth 2 (two) times daily. 60 tablet 11  . furosemide (LASIX) 40 MG tablet Take 1 tablet (40 mg total) by mouth daily. 30 tablet 3  . levothyroxine (SYNTHROID, LEVOTHROID) 75 MCG tablet Take 1 tablet (75 mcg total) by mouth daily. 30 tablet 5  . MOVIPREP 100 G SOLR Take 1 kit (200 g total) by mouth once. 1 kit  0  . Vitamin D, Ergocalciferol, (DRISDOL) 50000 UNITS CAPS capsule Take 1 capsule (50,000 Units total) by mouth every 7 (seven) days. 30 capsule 1   No current facility-administered medications for this visit.    Allergies:    Allergies  Allergen Reactions  . Celecoxib     REACTION: unspecified  . Clindamycin Hcl     REACTION: diarrhea  . Rofecoxib     REACTION: unspecified  . Sertraline Hcl     REACTION: unspecified    Social History:  The patient  reports that she quit smoking about 26 years ago. Her smoking use included Cigarettes. She has a 20 pack-year smoking history. She has never used smokeless tobacco. She reports that she drinks alcohol. She reports that she does not use illicit drugs.   Family History  Problem Relation Age of Onset  . Stroke Father 41  . Heart attack Father   . Alcohol abuse  Mother   . Coronary artery disease Father 72  . Breast cancer Sister   . Heart disease Sister   . Colon cancer Neg Hx     ROS:  Please see the history of present illness.   Denies any fevers, chills, orthopnea, PND. Positive cough, mucus production, dyspnea. No syncope, no rashes, no blood loss   All other systems reviewed and negative.   PHYSICAL EXAM: VS:  BP 122/68 mmHg  Pulse 72  Ht _0  (1.626 m)  Wt 152 lb (68.947 kg)  BMI 26.08 kg/m2 Well nourished, well developed, in no acute distressElderly, walks with a cane. HEENT: normal, Louise/AT, EOMI Neck: no JVD, normal carotid upstroke, no bruit Cardiac:  normal S1, S2; RRR; soft systolic murmurEctopy noted during auscultation, occasional short bursts of tachycardia. Lungs: Mild rhonchi/ velcro like heard bilateral bases upon deep inspiration. No active wheezing  Abd: soft, nontender, no hepatomegaly, no bruits Ext: no edema, 2+ distal pulses Skin: warm and dry GU: deferred Neuro: no focal abnormalities noted, AAO x 3  EKG:  Sinus rhythm with premature atrial contraction noted as well as paroxysmal atrial tachycardia. No atrial fibrillation. Nonspecific ST-T wave changes.     ECHO: 01/06/14: - Normal LV size with mild LV hypertrophy. EF 55-60%. Normal RV size and systolic function. Severe LAE. No valvular abnormalitiesto explain murmur.  ASSESSMENT AND PLAN:  1. Dyspnea-reassuring ejection fraction. BNP minimally elevated. Lasix seems to be helping, likely reducing diastolic dysfunction/left atrial filling pressures and we will continue with this. It seems at this point that the discovery of possible interstitial lung disease/pulmonary fibrosis as well as anemia may be contributing mostly to her shortness of breath.  2. Paroxysmal atrial tachycardia-PACs-noted on EKG. At first, I thought this was atrial fibrillation but it was not. May consider low-dose beta blocker or calcium channel blocker in future. No new medications for  now. 3. Former smoker-could be playing a role in her dyspnea. Mucus production. Pulmonary. Dr. Lake Bells. 4. PRN follow-up. I'm happy to see her any time necessary.  Signed, Candee Furbish, MD [facility]  02/13/2014 9:49 AM

## 2014-02-13 NOTE — Patient Instructions (Signed)
The current medical regimen is effective;  continue present plan and medications.  Follow up as needed.  Thank you for choosing [facility]!!     

## 2014-02-17 NOTE — Progress Notes (Signed)
Quick Note:  lmtcb X1 for pt. ______ 

## 2014-02-24 ENCOUNTER — Telehealth: Payer: Self-pay | Admitting: Pulmonary Disease

## 2014-02-24 ENCOUNTER — Encounter: Payer: Self-pay | Admitting: Pulmonary Disease

## 2014-02-24 NOTE — Telephone Encounter (Signed)
pts daughter called. Pt got a letter in the mail that pt needs a PA for CT that was done on 1/15. Pt aware that we will take care of this. Informed pt it may be the beginning of next week before she gets a call. she is ok with this as long as we are working on it.  Also see pt email on 2/1.  Please call patients daughter [name] [name] at [phone]

## 2014-02-24 NOTE — Telephone Encounter (Signed)
Dr. McQuaid please advise. Thanks! 

## 2014-02-24 NOTE — Telephone Encounter (Signed)
Called spoke with pt's daughter [name] - letter states 2 reason this was denied - will cost pt $1800 Unable to tell if pre-cert was done or needed prior to CT  Apologized to [facility] this issue with pt's CT and informed her that we will do whatever we can to assist with this process.  Asked if [name] could bring a copy of the letter to the office so that BQ may view this and see what can be done.  [name] will bring this to the office tomorrow for us to view - advised to leave for me.  She is aware that Almyra FreeLibby is out of the office this week and we may need to wait for her assistance when she returns on 2/8.  [name] is okay with this and is aware we will keep her updated.  Spoke with Bjorn Loserhonda who was able to view in pt's chart that Almyra FreeLibby did pre-cert the CT (authorization # [id number]) when it was scheduled at [facility]  ???this has something to do with the denial.  Almyra FreeLibby typically keeps her pre-certifications - will hold the letter to speak with her when she returns to the office.  Will forward message to my inbasket.

## 2014-02-25 ENCOUNTER — Encounter: Payer: Self-pay | Admitting: *Deleted

## 2014-02-26 ENCOUNTER — Ambulatory Visit (AMBULATORY_SURGERY_CENTER): Payer: Medicare PPO | Admitting: Internal Medicine

## 2014-02-26 ENCOUNTER — Encounter: Payer: Self-pay | Admitting: Internal Medicine

## 2014-02-26 ENCOUNTER — Other Ambulatory Visit: Payer: Self-pay | Admitting: *Deleted

## 2014-02-26 VITALS — BP 135/65 | HR 69 | Temp 97.2°F | Resp 18 | Ht 64.0 in | Wt 158.0 lb

## 2014-02-26 DIAGNOSIS — K222 Esophageal obstruction: Secondary | ICD-10-CM

## 2014-02-26 DIAGNOSIS — D509 Iron deficiency anemia, unspecified: Secondary | ICD-10-CM

## 2014-02-26 DIAGNOSIS — K469 Unspecified abdominal hernia without obstruction or gangrene: Secondary | ICD-10-CM

## 2014-02-26 DIAGNOSIS — K2970 Gastritis, unspecified, without bleeding: Secondary | ICD-10-CM

## 2014-02-26 MED ORDER — SODIUM CHLORIDE 0.9 % IV SOLN: 500.0000 mL | INTRAVENOUS | Status: DC

## 2014-02-26 NOTE — Progress Notes (Signed)
Dr. Juanda ChanceBrodie advised holding hemocult cards until visit with Dr. Clent RidgesFry, because she could be positive for bleeding post proceedures this day.

## 2014-02-26 NOTE — Progress Notes (Signed)
  [facility] Anesthesia Post-op Note  Patient: [name] [name]  Procedure(s) Performed: colonoscopy and endoscopy  Patient Location: [facility] - Recovery Area  Anesthesia Type: Deep Sedation/Propofol  Level of Consciousness: awake, oriented and patient cooperative  Airway and Oxygen Therapy: Patient Spontanous Breathing  Post-op Pain: none  Post-op Assessment:  Post-op Vital signs reviewed, Patient's Cardiovascular Status Stable, Respiratory Function Stable, Patent Airway, No signs of Nausea or vomiting and Pain level controlled  Post-op Vital Signs: Reviewed and stable  Complications: No apparent anesthesia complications  Kista Robb E 11:20 AM

## 2014-02-26 NOTE — Op Note (Signed)
[facility] [address] [place], [id number]   ENDOSCOPY PROCEDURE REPORT  PATIENT: [name] [name], [name] [name]  MR#: [id number] BIRTHDATE: [birth year] , 83  yrs. old GENDER: female ENDOSCOPIST: Hart Carwinora M Trystian Crisanto, MD REFERRED BY:  Nelwyn SalisburyStephen A Fry, M.D. PROCEDURE DATE:  02/26/2014 PROCEDURE:  EGD w/ biopsy ASA CLASS:     Class III INDICATIONS:  iron deficiency anemia.  Hemoglobin dropped to 9.0. Low ferritin 7.  Anoscopy 2004 showed diverticulosis.  Patient has been on aspirin 81 mg. MEDICATIONS: Monitored anesthesia care and Propofol 150 mg IV TOPICAL ANESTHETIC: none  DESCRIPTION OF PROCEDURE: After the risks benefits and alternatives of the procedure were thoroughly explained, informed consent was obtained.  The [facility] [id number] endoscope was introduced through the mouth and advanced to the second portion of the duodenum , Without limitations.  The instrument was slowly withdrawn as the mucosa was fully examined.    Esophagus: there were retained secretions in the esophagus which appeared to be torturous, there was a mild nonobstructing eccentric stricture distally which allowed the endoscope to traverse into small hiatal hernia. There was no evidence of active esophagitis.  Stomach: there was a 4 cm nonreducible hiatal hernia without Cameron erosions. There were linear erosions in the gastric antrum and gastric body. Biopsies were obtained to rule out H. pylori. There was no blood in the stomach. The rugal folds were diminished. Gastric outlet was normal Duodenum: duodenal bulb and descending duodenum was normal[ The scope was then withdrawn from the patient and the procedure completed.  COMPLICATIONS: There were no immediate complications.  ENDOSCOPIC IMPRESSION: 1. mild nonobstructing esophageal stricture 2. Tortuous esophagus with the retained secretions consistent with presbyesophagus 3. 4 cm moderate size hiatal hernia without Cameron erosions can  be potentially source of GI blood loss 4. Mild erosive gastritis status post biopsies  RECOMMENDATIONS: 1.  Await pathology results 2.  Anti-reflux regimen to be follow 3.  Proceed with colonoscopy  REPEAT EXAM: for EGD pending biopsy results.  eSigned:  Hart Carwinora M Haruto Demaria, MD 02/26/2014 11:23 AM    CC:  PATIENT NAME:  [name] [name], [name] [name] MR#: [id number]

## 2014-02-26 NOTE — Patient Instructions (Signed)

## 2014-02-26 NOTE — Progress Notes (Signed)
Called to room to assist during endoscopic procedure.  Patient ID and intended procedure confirmed with present staff. Received instructions for my participation in the procedure from the performing physician.  

## 2014-02-26 NOTE — Op Note (Signed)
[facility] [address] [place], [id number]   COLONOSCOPY PROCEDURE REPORT  PATIENT: [name] [name], [name] [name]  MR#: [id number] BIRTHDATE: Jan 09, 1931 , 83  yrs. old GENDER: female ENDOSCOPIST: Hart Carwinora M Geraldine Tesar, MD REFERRED XB:[id number]:Stephen Marguerita BeardsA Fry, M.D. PROCEDURE DATE:  02/26/2014 PROCEDURE:   Colonoscopy, screening First Screening Colonoscopy - Avg.  risk and is 50 yrs.  old or older - No.  Prior Negative Screening - Now for repeat screening. 10 or more years since last screening  History of Adenoma - Now for follow-up colonoscopy & has been > or = to 3 yrs.  N/A  Polyps Removed Today? No.  Polyps Removed Today? No.  Recommend repeat exam, <10 yrs? Polyps Removed Today? No.  Recommend repeat exam, <10 yrs? No. ASA CLASS:   Class III INDICATIONS:last colonoscopy in 2004 showed diverticulosis.  Patient has iron deficiency anemia corrected with iron supplements. MEDICATIONS: Monitored anesthesia care and Propofol 50 mg IV  DESCRIPTION OF PROCEDURE:   After the risks benefits and alternatives of the procedure were thoroughly explained, informed consent was obtained.  The digital rectal exam revealed no abnormalities of the rectum.   The [facility] [id number]  endoscope was introduced through the anus and advanced to the cecum, which was identified by both the appendix and ileocecal valve. No adverse events experienced.   The quality of the prep was good, using MoviPrep  The instrument was then slowly withdrawn as the colon was fully examined.      COLON FINDINGS: There was moderate diverticulosis noted throughout the entire examined colon with associated muscular hypertrophy, colonic spasm, angulation and tortuosity.  Retroflexed views revealed no abnormalities. The time to cecum=7 minutes 050 seconds. Withdrawal time=6 minutes 01 seconds.  The scope was withdrawn and the procedure completed. COMPLICATIONS: There were no immediate complications.  ENDOSCOPIC IMPRESSION: There  was moderate diverticulosis noted throughout the entire examined colon nothing to account for iron deficiency anemia  RECOMMENDATIONS: 1. follow iron deficiency anemia and iron levels 2. Recheck stool Hemoccults and if positive consider small bowel capsule endoscopy 3. I suspect patient has a  low-grade GI blood loss from large hiatal hernia/ Cameron erosions. As long as she continues iron supplements no further work up needed, she certainly is not a candidate for Nissen fundoplication  eSigned:  Hart Carwinora M Yaretzy Olazabal, MD 02/26/2014 11:29 AM   cc:

## 2014-02-26 NOTE — Telephone Encounter (Signed)
Spoke with pt's daughter. She is aware that BQ has written a letter. She would like this to be placed in the mail to her. This will be done. Nothing further was needed.

## 2014-02-27 ENCOUNTER — Telehealth: Payer: Self-pay | Admitting: *Deleted

## 2014-02-27 NOTE — Telephone Encounter (Signed)
  Follow up Call-  Call back number 02/26/2014  Post procedure Call Back phone  # [phone]  Permission to leave phone message Yes     Patient questions:  Do you have a fever, pain , or abdominal swelling? No. Pain Score  0 *  Have you tolerated food without any problems? Yes.    Have you been able to return to your normal activities? Yes.    Do you have any questions about your discharge instructions: Diet   No. Medications  No. Follow up visit  No.  Do you have questions or concerns about your Care? No.  Actions: * If pain score is 4 or above: No action needed, pain <4.

## 2014-02-27 NOTE — Telephone Encounter (Signed)
Spoke with patient's daughter and she states patient will have hemoccult cards done by her PCP Dr. Clent RidgesFry.

## 2014-03-03 ENCOUNTER — Encounter: Payer: Self-pay | Admitting: Internal Medicine

## 2014-03-05 NOTE — Telephone Encounter (Signed)
Per 2.10.16 mychart message from pt: Message     Lillia AbedLindsay, I have not received the letter yet so can you please resend it? When we talked last week on Tuesday, 2/2 you were going to mail it and since it's been an entire week I wonder that it is lost in the mail. I am anxious to file the appeal with [facility] and need the letter right away. Thank you!   Letter reprinted and signed by BQ here in the office now Called spoke with patient's daughter [name] - apologized that she hasn't received the letter yet and asked if she would like it mailed again or if she would be on this side of town to pick it up?  [name] will pick up letter tomorrow 2/11  Letter placed up front in the brown folder Nothing further needed; will sign off.

## 2014-03-06 NOTE — Progress Notes (Signed)
Quick Note:  Lmtcb, letter sent. ______

## 2014-03-26 ENCOUNTER — Other Ambulatory Visit (INDEPENDENT_AMBULATORY_CARE_PROVIDER_SITE_OTHER): Payer: Medicare PPO

## 2014-03-26 DIAGNOSIS — D509 Iron deficiency anemia, unspecified: Secondary | ICD-10-CM

## 2014-03-26 DIAGNOSIS — E559 Vitamin D deficiency, unspecified: Secondary | ICD-10-CM

## 2014-03-26 DIAGNOSIS — E038 Other specified hypothyroidism: Secondary | ICD-10-CM

## 2014-03-26 LAB — CBC WITH DIFFERENTIAL/PLATELET
BASOS PCT: 0.7 % (ref 0.0–3.0)
Basophils Absolute: 0 10*3/uL (ref 0.0–0.1)
EOS PCT: 1.3 % (ref 0.0–5.0)
Eosinophils Absolute: 0.1 10*3/uL (ref 0.0–0.7)
HCT: 36.1 % (ref 36.0–46.0)
HEMOGLOBIN: 12.1 g/dL (ref 12.0–15.0)
LYMPHS PCT: 50.6 % — AB (ref 12.0–46.0)
Lymphs Abs: 3.7 10*3/uL (ref 0.7–4.0)
MCHC: 33.6 g/dL (ref 30.0–36.0)
MCV: 95 fl (ref 78.0–100.0)
MONO ABS: 0.7 10*3/uL (ref 0.1–1.0)
Monocytes Relative: 9.2 % (ref 3.0–12.0)
NEUTROS PCT: 38.2 % — AB (ref 43.0–77.0)
Neutro Abs: 2.8 10*3/uL (ref 1.4–7.7)
PLATELETS: 229 10*3/uL (ref 150.0–400.0)
RBC: 3.8 Mil/uL — ABNORMAL LOW (ref 3.87–5.11)
RDW: 24.5 % — ABNORMAL HIGH (ref 11.5–15.5)
WBC: 7.2 10*3/uL (ref 4.0–10.5)

## 2014-03-26 LAB — FERRITIN: FERRITIN: 46.4 ng/mL (ref 10.0–291.0)

## 2014-03-26 LAB — VITAMIN D 25 HYDROXY (VIT D DEFICIENCY, FRACTURES): VITD: 20.19 ng/mL — ABNORMAL LOW (ref 30.00–100.00)

## 2014-03-26 LAB — TSH: TSH: 1.74 u[IU]/mL (ref 0.35–4.50)

## 2014-04-02 ENCOUNTER — Ambulatory Visit (INDEPENDENT_AMBULATORY_CARE_PROVIDER_SITE_OTHER): Payer: Medicare PPO | Admitting: Family Medicine

## 2014-04-02 ENCOUNTER — Encounter: Payer: Self-pay | Admitting: Family Medicine

## 2014-04-02 VITALS — BP 141/64 | HR 86 | Temp 98.6°F | Ht 65.0 in | Wt 153.0 lb

## 2014-04-02 DIAGNOSIS — E559 Vitamin D deficiency, unspecified: Secondary | ICD-10-CM

## 2014-04-02 DIAGNOSIS — E038 Other specified hypothyroidism: Secondary | ICD-10-CM

## 2014-04-02 DIAGNOSIS — I10 Essential (primary) hypertension: Secondary | ICD-10-CM

## 2014-04-02 DIAGNOSIS — J84112 Idiopathic pulmonary fibrosis: Secondary | ICD-10-CM

## 2014-04-02 DIAGNOSIS — E039 Hypothyroidism, unspecified: Secondary | ICD-10-CM | POA: Insufficient documentation

## 2014-04-02 MED ORDER — FUROSEMIDE 40 MG PO TABS: 40.0000 mg | ORAL_TABLET | Freq: Every day | ORAL | Status: DC

## 2014-04-02 NOTE — Progress Notes (Signed)
Pre visit review using our clinic review tool, if applicable. No additional management support is needed unless otherwise documented below in the visit note. 

## 2014-04-02 NOTE — Progress Notes (Signed)
   Subjective:    Patient ID: [name] [name], female    DOB: 12/02/1930, 81 y.o.   MRN: [id number]  HPI Here with her daughter for follow up. She is doing quite well and has no complaints today. Her Hgb is up to normal, and after we increased her Synthroid dose she is at a target TSH. She has more energy and and can walk farther without stopping to rest. She recently saw Dr. Kendrick FriesMcQuaid and her pulmonary fibrosis is stable. Her recent endoscopies came out okay.    Review of Systems  Constitutional: Negative.   Respiratory: Negative.   Cardiovascular: Negative.   Neurological: Negative.        Objective:   Physical Exam  Constitutional: She appears well-developed and well-nourished.  Neck: No thyromegaly present.  Cardiovascular: Normal rate, regular rhythm, normal heart sounds and intact distal pulses.   Pulmonary/Chest: Effort normal and breath sounds normal.  Lymphadenopathy:    She has no cervical adenopathy.          Assessment & Plan:  She is doing quite well. Stay on current regimen. We will recheck a Hgb, vitamin D, and TSH in 6 months.

## 2014-04-03 ENCOUNTER — Telehealth: Payer: Self-pay | Admitting: Family Medicine

## 2014-04-03 NOTE — Telephone Encounter (Signed)
emmi emailed °

## 2014-05-16 ENCOUNTER — Ambulatory Visit: Payer: Medicare PPO | Admitting: Pulmonary Disease

## 2014-06-01 ENCOUNTER — Other Ambulatory Visit: Payer: Self-pay | Admitting: Family Medicine

## 2014-06-03 ENCOUNTER — Encounter: Payer: Self-pay | Admitting: Pulmonary Disease

## 2014-06-03 ENCOUNTER — Other Ambulatory Visit (INDEPENDENT_AMBULATORY_CARE_PROVIDER_SITE_OTHER): Payer: Medicare PPO

## 2014-06-03 ENCOUNTER — Ambulatory Visit (INDEPENDENT_AMBULATORY_CARE_PROVIDER_SITE_OTHER): Payer: Medicare PPO | Admitting: Pulmonary Disease

## 2014-06-03 VITALS — BP 124/68 | HR 90 | Ht 67.0 in | Wt 147.0 lb

## 2014-06-03 DIAGNOSIS — J84112 Idiopathic pulmonary fibrosis: Secondary | ICD-10-CM | POA: Diagnosis not present

## 2014-06-03 DIAGNOSIS — F329 Major depressive disorder, single episode, unspecified: Secondary | ICD-10-CM | POA: Diagnosis not present

## 2014-06-03 DIAGNOSIS — E559 Vitamin D deficiency, unspecified: Secondary | ICD-10-CM

## 2014-06-03 DIAGNOSIS — R5383 Other fatigue: Secondary | ICD-10-CM

## 2014-06-03 LAB — CBC WITH DIFFERENTIAL/PLATELET
BASOS ABS: 0.1 10*3/uL (ref 0.0–0.1)
Basophils Relative: 1 % (ref 0.0–3.0)
Eosinophils Absolute: 0.1 10*3/uL (ref 0.0–0.7)
Eosinophils Relative: 1.1 % (ref 0.0–5.0)
HEMATOCRIT: 37.6 % (ref 36.0–46.0)
HEMOGLOBIN: 13 g/dL (ref 12.0–15.0)
LYMPHS ABS: 3 10*3/uL (ref 0.7–4.0)
Lymphocytes Relative: 36.2 % (ref 12.0–46.0)
MCHC: 34.6 g/dL (ref 30.0–36.0)
MCV: 98.1 fl (ref 78.0–100.0)
MONOS PCT: 8.7 % (ref 3.0–12.0)
Monocytes Absolute: 0.7 10*3/uL (ref 0.1–1.0)
NEUTROS ABS: 4.3 10*3/uL (ref 1.4–7.7)
Neutrophils Relative %: 53 % (ref 43.0–77.0)
Platelets: 258 10*3/uL (ref 150.0–400.0)
RBC: 3.84 Mil/uL — ABNORMAL LOW (ref 3.87–5.11)
RDW: 15.7 % — ABNORMAL HIGH (ref 11.5–15.5)
WBC: 8.2 10*3/uL (ref 4.0–10.5)

## 2014-06-03 LAB — VITAMIN D 25 HYDROXY (VIT D DEFICIENCY, FRACTURES): VITD: 27.26 ng/mL — AB (ref 30.00–100.00)

## 2014-06-03 LAB — TSH: TSH: 1.22 u[IU]/mL (ref 0.35–4.50)

## 2014-06-03 NOTE — Assessment & Plan Note (Signed)
This is a new problem to me and has been worsening recently. She does not have signs of worsening shortness of breath or any pain of any kind which would explain a heart or lung problem contributing to this. However, it's possible that she may have some nocturnal hypoxemia which is contributing. I explained to the patient and her daughter that the differential diagnosis of fatigue is quite broad and I think some broad strokes approach at this point may be to check a CBC and a TSH to ensure that her anemia is not back and that her hypothyroidism is adequately treated.  However, on further history it sounds as if she may have depression. She did eventually become tearful in clinic when we spoke about this.  Plan: CBC TSH Vitamin D level Overnight oximetry testing A follow this testing is unrevealing and I have advised that she follow-up with her primary care physician to consider starting on medicine for depression

## 2014-06-03 NOTE — Assessment & Plan Note (Signed)
At this point I have no objective evidence of worsening, but her daughter understands that this is a progressive disease. Because of [name] [name]'s dementia, she does not recall the fact that she has been diagnosed with idiopathic pulmonary fibrosis. Her daughter has very appropriately decided not to treat her with anti-fibrotic therapy. I support this considering the patient's dementia.  Plan: -Follow-up in 6 months to see how symptoms are going, consider pulmonary function test at that time

## 2014-06-03 NOTE — Progress Notes (Signed)
Subjective:    Patient ID: [name] [name], female    DOB: 11-24-1930, 81 y.o.   MRN: [id number]  Synopsis: Referred in 2016 for IPF January 2016 chest x-ray> bilateral peripheral-based interstitial and nodular opacities, cardiomegaly 01/2014 ANA, RNP, SSA, SSB, Aldolase, CCP, ESR all negative January 2016 pulmonary function test> ratio 87%, FEV1 1.69 L (90% predicted, 10% change), total lung capacity 3.36 L (66% predicted), DLCO 7.98 (32% predicted) January 2016 high resolution CT chest> interlobular septal thickening, peripheral honeycombing, and variagated pattern of interstitial disease most specifically in the periphery and worse in the bases, overall picture consistent with usual interstitial pneumonitis. There is mild mediastinal lymphadenopathy favored to be reactive to the interstitial lung disease. Hiatal hernia, 3 vessel CAD.  HPI Chief Complaint  Patient presents with  . Follow-up    Pt states her breathing is stable, but c/o increased fatigue.  Pt is not wanting to proceed with Ofev or Esbriet at this time.     [name] [name] says that she has no energy lately.  Her daughter thinks that it has worsened in the last few weeks.  She has not been participating in family activities because of this.  She has difficulty walking around because she has very little energy.  She has no dyspnea.  She does not feel sleepy.  She does take a light rest every afternoon but she does not nap.  She sleeps from 10pm through 6:30 AM.  No bleeding.  Does not feel hot or cold. No poor mood, no crying episodes, doesn't feel depressed.  Her daughter says that she thinks that her mood has been a little worse recently.  She has been concerned about her husband's poor health.    She doesn't have any pain.    Past Medical History  Diagnosis Date  . Vertebral fracture   . Depression   . Hyperlipidemia   . Hypertension   . Osteoporosis   . OSA (obstructive sleep apnea)   . PMR (polymyalgia rheumatica)   .  Postherpetic neuralgia   . Diverticulosis of colon   . History of alcohol abuse   . Vitamin D deficiency   . Spinal stenosis   . Anemia   . Irritable bowel syndrome   . Anxiety   . Arthritis   . Thyroid disease   . IPF (idiopathic pulmonary fibrosis)       Review of Systems  Constitutional: Positive for fatigue. Negative for fever and chills.  HENT: Negative for postnasal drip, rhinorrhea and sinus pressure.   Respiratory: Positive for shortness of breath. Negative for cough and stridor.   Cardiovascular: Negative for chest pain, palpitations and leg swelling.       Objective:   Physical Exam  Filed Vitals:   06/03/14 1152  BP: 124/68  Pulse: 90  Height: $Remove'5\' 7"'tVnzEIF$  (1.702 m)  Weight: 147 lb (66.679 kg)  SpO2: 96%   RA  Gen: chronically ill appearing, no acute distress HEENT: NCAT, PERRL, EOMi, OP clear, neck supple without masses PULM: Crackles in bases CV: RRR, no mgr, no JVD AB: BS+, soft, nontender Ext: warm, no edema, no clubbing, no cyanosis Derm: no rash or skin breakdown Neuro: A&Ox4, MAEW  Recent PCP notes reviewed with normal TSH and h/h values in 03/2014     Assessment & Plan:   IPF (idiopathic pulmonary fibrosis) At this point I have no objective evidence of worsening, but her daughter understands that this is a progressive disease. Because of [name] [name]'s dementia, she does  not recall the fact that she has been diagnosed with idiopathic pulmonary fibrosis. Her daughter has very appropriately decided not to treat her with anti-fibrotic therapy. I support this considering the patient's dementia.  Plan: -Follow-up in 6 months to see how symptoms are going, consider pulmonary function test at that time   Vitamin D deficiency As noted below, she has been experiencing significant amounts of fatigue recently. We will check a vitamin D level today to see if the replacement therapy has been helpful at all.   Fatigue This is a new problem to me and has been  worsening recently. She does not have signs of worsening shortness of breath or any pain of any kind which would explain a heart or lung problem contributing to this. However, it's possible that she may have some nocturnal hypoxemia which is contributing. I explained to the patient and her daughter that the differential diagnosis of fatigue is quite broad and I think some broad strokes approach at this point may be to check a CBC and a TSH to ensure that her anemia is not back and that her hypothyroidism is adequately treated.  However, on further history it sounds as if she may have depression. She did eventually become tearful in clinic when we spoke about this.  Plan: CBC TSH Vitamin D level Overnight oximetry testing A follow this testing is unrevealing and I have advised that she follow-up with her primary care physician to consider starting on medicine for depression     Updated Medication List Outpatient Encounter Prescriptions as of 06/03/2014  Medication Sig  . cholecalciferol (VITAMIN D) 1000 UNITS tablet Take 500 Units by mouth daily.  . ferrous sulfate 324 (65 FE) MG TBEC Take 1 tablet (325 mg total) by mouth 2 (two) times daily.  . furosemide (LASIX) 40 MG tablet Take 1 tablet (40 mg total) by mouth daily.  Marland Kitchen levothyroxine (SYNTHROID, LEVOTHROID) 75 MCG tablet Take 1 tablet (75 mcg total) by mouth daily.  . [DISCONTINUED] Vitamin D, Ergocalciferol, (DRISDOL) 50000 UNITS CAPS capsule TAKE 1 CAPSULE ONCE A WEEK.   No facility-administered encounter medications on file as of 06/03/2014.

## 2014-06-03 NOTE — Assessment & Plan Note (Signed)
As noted below, she has been experiencing significant amounts of fatigue recently. We will check a vitamin D level today to see if the replacement therapy has been helpful at all.

## 2014-06-03 NOTE — Patient Instructions (Signed)
We will call you with the results of the blood work We will arrange an overnight oximetry test Talk to your doctor about treating depression We will see you back in 6 months with a pulmonary function test

## 2014-06-06 ENCOUNTER — Ambulatory Visit (INDEPENDENT_AMBULATORY_CARE_PROVIDER_SITE_OTHER): Payer: Medicare PPO | Admitting: Family Medicine

## 2014-06-06 ENCOUNTER — Encounter: Payer: Self-pay | Admitting: Family Medicine

## 2014-06-06 VITALS — BP 140/70 | Ht 67.0 in | Wt 147.0 lb

## 2014-06-06 DIAGNOSIS — I10 Essential (primary) hypertension: Secondary | ICD-10-CM

## 2014-06-06 DIAGNOSIS — E559 Vitamin D deficiency, unspecified: Secondary | ICD-10-CM

## 2014-06-06 MED ORDER — ERGOCALCIFEROL 1.25 MG (50000 UT) PO CAPS: 50000.0000 [IU] | ORAL_CAPSULE | ORAL | Status: DC

## 2014-06-06 MED ORDER — ESCITALOPRAM OXALATE 10 MG PO TABS: 10.0000 mg | ORAL_TABLET | Freq: Every day | ORAL | Status: DC

## 2014-06-06 NOTE — Progress Notes (Signed)
Pre visit review using our clinic review tool, if applicable. No additional management support is needed unless otherwise documented below in the visit note. 

## 2014-06-08 ENCOUNTER — Encounter: Payer: Self-pay | Admitting: Family Medicine

## 2014-06-08 NOTE — Progress Notes (Signed)
   Subjective:    Patient ID: [name] [name], female    DOB: 01/22/1931, 81 y.o.   MRN: [id number]  HPI Here to discuss chronic fatigue. She describes feeling tired all the time though not necessarily sleepy. She eats a good diet. She gets enough sleep. Recent labs were unremarkable except her vitamin D level is low again. She is taking OTC vitamin supplements. She saw Dr. Kendrick FriesMcQuaid on 06-03-14, and he was pleased with her health.   Review of Systems  Constitutional: Negative.   Cardiovascular: Negative.   Neurological: Negative.        Objective:   Physical Exam  Constitutional: She is oriented to person, place, and time. She appears well-developed and well-nourished.  Neck: Neck supple. No thyromegaly present.  Cardiovascular: Normal rate, regular rhythm, normal heart sounds and intact distal pulses.   Pulmonary/Chest: Effort normal and breath sounds normal.  Lymphadenopathy:    She has no cervical adenopathy.  Neurological: She is alert and oriented to person, place, and time.          Assessment & Plan:  The only real explanation that we have for her fatigue is the vitamin D deficiency, she will go back on 50,000 unit vitamin D capsules weekly and we will plan on these

## 2014-06-19 ENCOUNTER — Telehealth: Payer: Self-pay

## 2014-06-19 DIAGNOSIS — G4733 Obstructive sleep apnea (adult) (pediatric): Secondary | ICD-10-CM

## 2014-06-19 NOTE — Telephone Encounter (Signed)
Spoke with pt's daughter [name], she is aware of results and recs.  Order placed for 02 and ono.  Nothing further needed.

## 2014-06-19 NOTE — Telephone Encounter (Signed)
-----   Message from Lupita Leashouglas B McQuaid, MD sent at 06/19/2014  1:24 PM EDT ----- A, PLease let her know that her oxygen was low on testing (actually let her daughter know) and she needs 2 L O2 qHS with a repeat ONO. Thanks B

## 2014-06-29 ENCOUNTER — Other Ambulatory Visit: Payer: Self-pay | Admitting: Family Medicine

## 2014-07-04 ENCOUNTER — Telehealth: Payer: Self-pay

## 2014-07-04 NOTE — Telephone Encounter (Signed)
Spoke with pt's daughter Moises Blood, states that she is only aware of pt having one ONO.  States she is at work and will call us Monday morning to discuss this.  Will await call.

## 2014-07-04 NOTE — Telephone Encounter (Signed)
-----   Message from Lupita Leash, MD sent at 07/03/2014 12:28 PM EDT ----- A, Her ONO showed low oxygen saturation, she needs 2 L qHS, or if she is already on O2 then we need to increase it by 2Lpm and then repeat ONO. Thanks B

## 2014-07-21 ENCOUNTER — Other Ambulatory Visit: Payer: Self-pay

## 2014-07-30 ENCOUNTER — Encounter: Payer: Self-pay | Admitting: Pulmonary Disease

## 2014-08-25 ENCOUNTER — Other Ambulatory Visit: Payer: Self-pay | Admitting: Family Medicine

## 2014-12-29 ENCOUNTER — Other Ambulatory Visit: Payer: Self-pay | Admitting: Family Medicine

## 2015-01-09 ENCOUNTER — Ambulatory Visit: Payer: Medicare PPO | Admitting: Pulmonary Disease

## 2015-01-12 ENCOUNTER — Ambulatory Visit (INDEPENDENT_AMBULATORY_CARE_PROVIDER_SITE_OTHER): Payer: Medicare PPO | Admitting: Pulmonary Disease

## 2015-01-12 ENCOUNTER — Ambulatory Visit (HOSPITAL_COMMUNITY)
Admission: RE | Admit: 2015-01-12 | Discharge: 2015-01-12 | Disposition: A | Discharge status: Home / Self Care | Payer: Medicare PPO | Source: Ambulatory Visit | Attending: Pulmonary Disease | Admitting: Pulmonary Disease

## 2015-01-12 ENCOUNTER — Encounter: Payer: Self-pay | Admitting: Pulmonary Disease

## 2015-01-12 VITALS — BP 138/72 | HR 71 | Ht 67.0 in | Wt 140.0 lb

## 2015-01-12 DIAGNOSIS — J84112 Idiopathic pulmonary fibrosis: Secondary | ICD-10-CM

## 2015-01-12 LAB — PULMONARY FUNCTION TEST
DL/VA % pred: 53 %
DL/VA: 2.52 ml/min/mmHg/L
DLCO unc % pred: 28 %
DLCO unc: 6.55 ml/min/mmHg
FEF 25-75 Post: 2.32 L/sec
FEF 25-75 Pre: 1.98 L/sec
FEF2575-%Change-Post: 17 %
FEF2575-%PRED-PRE: 163 %
FEF2575-%Pred-Post: 192 %
FEV1-%Change-Post: 3 %
FEV1-%PRED-PRE: 88 %
FEV1-%Pred-Post: 91 %
FEV1-PRE: 1.55 L
FEV1-Post: 1.6 L
FEV1FVC-%CHANGE-POST: 0 %
FEV1FVC-%Pred-Pre: 118 %
FEV6-%CHANGE-POST: 3 %
FEV6-%PRED-POST: 82 %
FEV6-%PRED-PRE: 80 %
FEV6-POST: 1.84 L
FEV6-Pre: 1.79 L
FEV6FVC-%PRED-POST: 106 %
FEV6FVC-%PRED-PRE: 106 %
FVC-%Change-Post: 3 %
FVC-%Pred-Post: 77 %
FVC-%Pred-Pre: 75 %
FVC-Post: 1.84 L
FVC-Pre: 1.79 L
Post FEV1/FVC ratio: 87 %
Post FEV6/FVC ratio: 100 %
Pre FEV1/FVC ratio: 86 %
Pre FEV6/FVC Ratio: 100 %
RV % pred: 81 %
RV: 1.95 L
TLC % PRED: 80 %
TLC: 3.94 L

## 2015-01-12 MED ORDER — ALBUTEROL SULFATE (2.5 MG/3ML) 0.083% IN NEBU
2.5000 mg | INHALATION_SOLUTION | Freq: Once | RESPIRATORY_TRACT | Status: AC
  Administered 2015-01-12: 2.5 mg via RESPIRATORY_TRACT

## 2015-01-12 NOTE — Patient Instructions (Signed)
We will arrange a 6 minute walk and PFT when you come back to see us in 6 months

## 2015-01-12 NOTE — Progress Notes (Signed)
Subjective:    Patient ID: [name] [name], female    DOB: 1930/09/01, 81 y.o.   MRN: [id number]  Synopsis: Referred in 2016 for IPF January 2016 chest x-ray> bilateral peripheral-based interstitial and nodular opacities, cardiomegaly 01/2014 ANA, RNP, SSA, SSB, Aldolase, CCP, ESR all negative January 2016 pulmonary function test> ratio 87%, FEV1 1.69 L (90% predicted, 10% change), total lung capacity 3.36 L (66% predicted), DLCO 7.98 (32% predicted) January 2016 high resolution CT chest> interlobular septal thickening, peripheral honeycombing, and variagated pattern of interstitial disease most specifically in the periphery and worse in the bases, overall picture consistent with usual interstitial pneumonitis. There is mild mediastinal lymphadenopathy favored to be reactive to the interstitial lung disease. Hiatal hernia, 3 vessel CAD. May 2016 ONO RA : less than 88% for 137 min December 2016 pulmonary function testing ratio 87%, FVC 1.84 L (77% predicted), DLCO 6.55 (28% predicted), TLC 3.94 (80% predicted)   HPI Chief Complaint  Patient presents with  . Follow-up    pt here for 6 month follow up with PFT done today at [facility]. pt states he is doing very well, and feels like her breathing has been good. pt states her SOB , and being fatigued has inproved.  no c/o SOB, wheezing, cough or chest tightness.   Wally says that she thinks she is doing well.  She has not has been as fatigued lately as she was last year.  Her energy level seems to have improved.   She has been taking iron which seems to have improved.  Her depression symptoms have improved as well.   She has not respiratory complaints.    Past Medical History  Diagnosis Date  . Vertebral fracture   . Depression   . Hyperlipidemia   . Hypertension   . Osteoporosis   . OSA (obstructive sleep apnea)   . PMR (polymyalgia rheumatica) (HCC)   . Postherpetic neuralgia   . Diverticulosis of colon   . History of alcohol abuse   .  Vitamin D deficiency   . Spinal stenosis   . Anemia   . Irritable bowel syndrome   . Anxiety   . Arthritis   . Thyroid disease   . IPF (idiopathic pulmonary fibrosis) ([place])       Review of Systems  Constitutional: Negative for fever, chills and fatigue.  HENT: Negative for postnasal drip, rhinorrhea and sinus pressure.   Respiratory: Negative for cough, shortness of breath and stridor.   Cardiovascular: Negative for chest pain, palpitations and leg swelling.       Objective:   Physical Exam  Filed Vitals:   01/12/15 1209  BP: 138/72  Pulse: 71  Height: '5\' 7"'$  (1.702 m)  Weight: 140 lb (63.504 kg)  SpO2: 98%   RA  Gen: chronically ill appearing, no acute distress HEENT: NCAT, PERRL, EOMi, OP clear, neck supple without masses PULM: Crackles in bases to about halfway up bilaterally CV: RRR, no mgr, no JVD AB: BS+, soft, nontender Ext: warm, no edema, no clubbing, no cyanosis Derm: no rash or skin breakdown Neuro: A&Ox4, MAEW       Assessment & Plan:   IPF (idiopathic pulmonary fibrosis) (HCC) This has been a stable interval for [place]. Her exam findings have not changed and her lung volumes as measured by FVC and total lung capacity have not changed. There has been a mild decrease in her diffusion capacity but considering the stability if not improvement of her symptoms I consider this a stable  interval. Today we did discuss once more the rationale behind anti-fibrotic therapy. She and her daughter still agree that there is not really a role for anti-fibrotic treatment. They have been reluctant to start this considering her dementia and other medical problems.  Plan: We will plan on bringing her back in 6 months for one final lung function test in 6 minute walk After that visit I think we should probably just plan on expected management alone. If they have a change in heart about anti-fibrotic therapy then I would be happy to prescribe it.    Updated Medication  List Outpatient Encounter Prescriptions as of 01/12/2015  Medication Sig  . aspirin 81 MG tablet Take 81 mg by mouth.  . ergocalciferol (VITAMIN D2) 50000 UNITS capsule Take 1 capsule (50,000 Units total) by mouth once a week.  . escitalopram (LEXAPRO) 10 MG tablet TAKE 1 TABLET DAILY.  . ferrous sulfate 324 (65 FE) MG TBEC Take 1 tablet (325 mg total) by mouth 2 (two) times daily.  . furosemide (LASIX) 40 MG tablet Take 1 tablet (40 mg total) by mouth daily.  Marland Kitchen levothyroxine (SYNTHROID, LEVOTHROID) 75 MCG tablet TAKE 1 TABLET DAILY.  Marland Kitchen timolol (TIMOPTIC) 0.5 % ophthalmic solution Place 1 drop into both eyes 2 (two) times daily.   No facility-administered encounter medications on file as of 01/12/2015.

## 2015-01-12 NOTE — Assessment & Plan Note (Signed)
This has been a stable interval for [name]. Her exam findings have not changed and her lung volumes as measured by FVC and total lung capacity have not changed. There has been a mild decrease in her diffusion capacity but considering the stability if not improvement of her symptoms I consider this a stable interval. Today we did discuss once more the rationale behind anti-fibrotic therapy. She and her daughter still agree that there is not really a role for anti-fibrotic treatment. They have been reluctant to start this considering her dementia and other medical problems.  Plan: We will plan on bringing her back in 6 months for one final lung function test in 6 minute walk After that visit I think we should probably just plan on expected management alone. If they have a change in heart about anti-fibrotic therapy then I would be happy to prescribe it.

## 2015-02-22 ENCOUNTER — Other Ambulatory Visit: Payer: Self-pay | Admitting: Family Medicine

## 2015-02-23 NOTE — Telephone Encounter (Signed)
Can we refill this? 

## 2015-04-12 ENCOUNTER — Other Ambulatory Visit: Payer: Self-pay | Admitting: Family Medicine

## 2015-04-26 ENCOUNTER — Other Ambulatory Visit: Payer: Self-pay | Admitting: Family Medicine

## 2015-05-04 ENCOUNTER — Telehealth: Payer: Self-pay | Admitting: Family Medicine

## 2015-05-04 ENCOUNTER — Encounter: Payer: Self-pay | Admitting: Family Medicine

## 2015-05-04 ENCOUNTER — Ambulatory Visit (INDEPENDENT_AMBULATORY_CARE_PROVIDER_SITE_OTHER): Payer: Medicare Other | Admitting: Family Medicine

## 2015-05-04 VITALS — BP 120/70 | HR 76 | Temp 97.4°F | Wt 149.7 lb

## 2015-05-04 DIAGNOSIS — R5383 Other fatigue: Secondary | ICD-10-CM | POA: Diagnosis not present

## 2015-05-04 DIAGNOSIS — J84112 Idiopathic pulmonary fibrosis: Secondary | ICD-10-CM | POA: Diagnosis not present

## 2015-05-04 DIAGNOSIS — Z862 Personal history of diseases of the blood and blood-forming organs and certain disorders involving the immune mechanism: Secondary | ICD-10-CM

## 2015-05-04 LAB — COMPREHENSIVE METABOLIC PANEL
ALBUMIN: 3.8 g/dL (ref 3.5–5.2)
ALK PHOS: 71 U/L (ref 39–117)
ALT: 136 U/L — ABNORMAL HIGH (ref 0–35)
AST: 74 U/L — AB (ref 0–37)
BUN: 28 mg/dL — AB (ref 6–23)
CALCIUM: 9.5 mg/dL (ref 8.4–10.5)
CHLORIDE: 100 meq/L (ref 96–112)
CO2: 32 mEq/L (ref 19–32)
CREATININE: 0.91 mg/dL (ref 0.40–1.20)
GFR: 62.75 mL/min (ref >60.00)
Glucose, Bld: 96 mg/dL (ref 70–99)
POTASSIUM: 4.5 meq/L (ref 3.5–5.1)
Sodium: 141 mEq/L (ref 135–145)
TOTAL PROTEIN: 7.4 g/dL (ref 6.0–8.3)
Total Bilirubin: 0.9 mg/dL (ref 0.2–1.2)

## 2015-05-04 LAB — TSH: TSH: 1.34 u[IU]/mL (ref 0.35–4.50)

## 2015-05-04 NOTE — Telephone Encounter (Signed)
FYI:  Appt scheduled with Amil AmenJulia

## 2015-05-04 NOTE — Progress Notes (Signed)
Subjective:    Patient ID: [name] [name], female    DOB: 11-18-1930, 82 y.o.   MRN: [id number]  HPI  [name] [name] is an 82 year old patient who presents today with fatigue that has increased over the past year but noticeably changed over the past 2 weeks. Today, I am seeing her in place of her PCP who is unavailable at this time.  She notes lack of energy but denies increased sleepiness. Her daughter and husband are present today and note that she has required a rest when walking distances in the assisted living facility however this does not occur on a daily basis. She denies chest pain, palpitations, SOB, weakness, or dizziness. Pertinent history of anemia, idiopathic pulmonary fibrosis, hypothyroidism and knee pain that has been treated with a steroid injection by orthopedics. She states that her knee pain was a source of decreased ambulation but this has improved and she still states that she does not feel like "doing much."   Further pertinent history includes failing to take her iron supplement regularly for at least one month but she is now taking this daily for the past 2 weeks with limited improvement in her symptoms.  Colonoscopy from 02/2014 indicated moderate diverticulosis with suspected low grade GI blood loss from large hiatal hernia with recommendations to follow iron deficiency anemia and iron levels.    Review of Systems  Constitutional: Negative for fever, chills and fatigue.  HENT: Negative for congestion, postnasal drip, rhinorrhea, sinus pressure, sneezing and sore throat.   Eyes: Negative for visual disturbance.  Respiratory: Negative for cough, chest tightness, shortness of breath and wheezing.   Cardiovascular: Negative for chest pain, palpitations and leg swelling.  Gastrointestinal: Negative for nausea, vomiting, abdominal pain, diarrhea, constipation and blood in stool.  Endocrine: Negative for polydipsia, polyphagia and polyuria.  Genitourinary: Negative for dysuria,  urgency, frequency, hematuria and flank pain.  Musculoskeletal: Negative for joint swelling.  Skin: Negative for rash.  Neurological: Negative for dizziness, light-headedness and headaches.   Past Medical History  Diagnosis Date  . Vertebral fracture   . Depression   . Hyperlipidemia   . Hypertension   . Osteoporosis   . OSA (obstructive sleep apnea)   . PMR (polymyalgia rheumatica) (HCC)   . Postherpetic neuralgia   . Diverticulosis of colon   . History of alcohol abuse   . Vitamin D deficiency   . Spinal stenosis   . Anemia   . Irritable bowel syndrome   . Anxiety   . Arthritis   . Thyroid disease   . IPF (idiopathic pulmonary fibrosis) (HCC)     Social History   Social History  . Marital Status: Married    Spouse Name: N/A  . Number of Children: N/A  . Years of Education: N/A   Occupational History  . Not on file.   Social History Main Topics  . Smoking status: Former Smoker -- 1.00 packs/day for 20 years    Types: Cigarettes    Quit date: 01/25/1988  . Smokeless tobacco: Never Used  . Alcohol Use: 0.0 oz/week    0 Standard drinks or equivalent per week     Comment: 2 glasses every day  . Drug Use: No  . Sexual Activity: Not on file   Other Topics Concern  . Not on file   Social History Narrative    Past Surgical History  Procedure Laterality Date  . Rotator cuff repair    . Breast biopsy    .  Vertebroplasty      x 3  . Appendectomy    . Cataract extraction    . Knee arthroscopy  10/2007 and 10/2010    left  . Dilation and curettage of uterus      x several  . Replacement total knee  august 2012    Family History  Problem Relation Age of Onset  . Stroke Father 676  . Heart attack Father   . Coronary artery disease Father 5640  . Alcohol abuse Mother   . Breast cancer Sister   . Heart disease Sister   . Colon cancer Neg Hx     Allergies  Allergen Reactions  . Celecoxib     REACTION: unspecified  . Clindamycin Hcl     REACTION:  diarrhea  . Rofecoxib     REACTION: unspecified  . Sertraline Hcl     REACTION: unspecified    Current Outpatient Prescriptions on File Prior to Visit  Medication Sig Dispense Refill  . ergocalciferol (VITAMIN D2) 50000 UNITS capsule Take 1 capsule (50,000 Units total) by mouth once a week. 4 capsule 11  . escitalopram (LEXAPRO) 10 MG tablet TAKE 1 TABLET DAILY. 30 tablet 11  . ferrous sulfate 324 (65 FE) MG TBEC Take 1 tablet (325 mg total) by mouth 2 (two) times daily. 60 tablet 11  . ferrous sulfate 325 (65 FE) MG tablet TAKE 1 TABLET TWICE DAILY. 60 tablet 11  . furosemide (LASIX) 40 MG tablet TAKE 1 TABLET DAILY. 30 tablet 0  . levothyroxine (SYNTHROID, LEVOTHROID) 75 MCG tablet TAKE 1 TABLET DAILY. 30 tablet 0  . aspirin 81 MG tablet Take 81 mg by mouth. Reported on 05/04/2015    . timolol (TIMOPTIC) 0.5 % ophthalmic solution Place 1 drop into both eyes 2 (two) times daily. Reported on 05/04/2015     No current facility-administered medications on file prior to visit.    BP 120/70 mmHg  Pulse 76  Temp(Src) 97.4 F (36.3 C) (Oral)  Wt 149 lb 11.2 oz (67.903 kg)     Objective:   Physical Exam  Constitutional: She is oriented to person, place, and time. She appears well-developed and well-nourished.  Eyes: Pupils are equal, round, and reactive to light. No scleral icterus.  Neck: Neck supple.  Cardiovascular: Normal rate, regular rhythm and intact distal pulses.   Murmur heard. Soft, systolic murmur  Pulmonary/Chest: Effort normal. She has no wheezes.  Abdominal: Soft. Bowel sounds are normal. She exhibits no distension. There is no tenderness.  Lymphadenopathy:    She has no cervical adenopathy.  Neurological: She is alert and oriented to person, place, and time. Coordination normal.  Skin: Skin is warm and dry. No rash noted.  Psychiatric: She has a normal mood and affect. Her behavior is normal.       Assessment & Plan:  1. Other fatigue Advised patient to keep her  appointment in 8 days with her PCP. Patient, daughter, and husband present today. They wished to have some beginning lab work prior to this appointment.   - CBC with Differential/Platelet - Comprehensive metabolic panel - TSH  2. Idiopathic pulmonary fibrosis (HCC) Advised patient to follow up with pulmonary as scheduled. She is due to be seen in the next 2 months.   3. History of anemia Continue iron supplement as ordered by her PCP. Lab results will indicate future treatment.

## 2015-05-04 NOTE — Progress Notes (Signed)
Pre visit review using our clinic review tool, if applicable. No additional management support is needed unless otherwise documented below in the visit note. 

## 2015-05-04 NOTE — Telephone Encounter (Signed)
Patient Name: Eugenia PancoastGLORIA Lancon DOB: 11/30/1930 Initial Comment Caller states mother is very lethargic Nurse Assessment Nurse: Yetta BarreJones, RN, Miranda Date/Time (Eastern Time): 05/04/2015 8:26:44 AM Confirm and document reason for call. If symptomatic, describe symptoms. You must click the next button to save text entered. ---Caller states her mother has chronic lung problems (Idiopathic Pulmonary Fibrosis). She is c/o fatigue and not wanting to get up to go to meals. Has the patient traveled out of the country within the last 30 days? ---No Does the patient have any new or worsening symptoms? ---Yes Will a triage be completed? ---Yes Related visit to physician within the last 2 weeks? ---No Does the PT have any chronic conditions? (i.e. diabetes, asthma, etc.) ---Yes List chronic conditions. ---Idiopathic Pulmonary Fibrosis, Thyroid, Anemia Is this a behavioral health or substance abuse call? ---No Guidelines Guideline Title Affirmed Question Affirmed Notes Weakness (Generalized) and Fatigue [1] MODERATE weakness (i.e., interferes with work, school, normal activities) AND [2] persists > 3 days Final Disposition User See Physician within 24 Hours Jones, Charity fundraiserN, Miranda Comments No appts available with PCP. Appt scheduled for today with Delbert HarnessJulie Kordsmeier at 11am. Referrals REFERRED TO PCP OFFICE Disagree/Comply: Comply

## 2015-05-04 NOTE — Patient Instructions (Signed)
Please go to lab before you leave for blood work.  Results will be called to you within one week or sooner if needed. Follow up with Dr. Clent RidgesFry on 03/14/15 as scheduled. If you develop symptoms such as chest pain or SOB please seek health care immediately. It was a pleasure meeting you today!

## 2015-05-06 ENCOUNTER — Other Ambulatory Visit (INDEPENDENT_AMBULATORY_CARE_PROVIDER_SITE_OTHER): Payer: Medicare Other

## 2015-05-06 DIAGNOSIS — R5382 Chronic fatigue, unspecified: Secondary | ICD-10-CM

## 2015-05-06 LAB — CBC WITH DIFFERENTIAL/PLATELET
BASOS ABS: 0 10*3/uL (ref 0.0–0.1)
Basophils Relative: 0.6 % (ref 0.0–3.0)
Eosinophils Absolute: 0.1 10*3/uL (ref 0.0–0.7)
Eosinophils Relative: 1.1 % (ref 0.0–5.0)
HCT: 37 % (ref 36.0–46.0)
Hemoglobin: 12 g/dL (ref 12.0–15.0)
LYMPHS ABS: 2.5 10*3/uL (ref 0.7–4.0)
Lymphocytes Relative: 34.4 % (ref 12.0–46.0)
MCHC: 32.5 g/dL (ref 30.0–36.0)
MCV: 105.3 fl — ABNORMAL HIGH (ref 78.0–100.0)
MONO ABS: 0.6 10*3/uL (ref 0.1–1.0)
MONOS PCT: 8.8 % (ref 3.0–12.0)
NEUTROS PCT: 55.1 % (ref 43.0–77.0)
Neutro Abs: 3.9 10*3/uL (ref 1.4–7.7)
PLATELETS: 252 10*3/uL (ref 150.0–400.0)
RBC: 3.51 Mil/uL — AB (ref 3.87–5.11)
RDW: 20.2 % — ABNORMAL HIGH (ref 11.5–15.5)
WBC: 7.1 10*3/uL (ref 4.0–10.5)

## 2015-05-11 ENCOUNTER — Other Ambulatory Visit: Payer: Self-pay | Admitting: Family Medicine

## 2015-05-12 ENCOUNTER — Encounter: Payer: Self-pay | Admitting: Family Medicine

## 2015-05-12 ENCOUNTER — Ambulatory Visit (INDEPENDENT_AMBULATORY_CARE_PROVIDER_SITE_OTHER): Payer: Medicare Other | Admitting: Family Medicine

## 2015-05-12 VITALS — BP 119/68 | HR 140 | Temp 97.9°F | Ht 67.0 in | Wt 145.0 lb

## 2015-05-12 DIAGNOSIS — D649 Anemia, unspecified: Secondary | ICD-10-CM

## 2015-05-12 DIAGNOSIS — I4891 Unspecified atrial fibrillation: Secondary | ICD-10-CM | POA: Diagnosis not present

## 2015-05-12 DIAGNOSIS — I499 Cardiac arrhythmia, unspecified: Secondary | ICD-10-CM

## 2015-05-12 DIAGNOSIS — R5383 Other fatigue: Secondary | ICD-10-CM

## 2015-05-12 LAB — POC URINALSYSI DIPSTICK (AUTOMATED)
Bilirubin, UA: NEGATIVE
Blood, UA: NEGATIVE
Glucose, UA: NEGATIVE
Ketones, UA: NEGATIVE
LEUKOCYTES UA: NEGATIVE
Nitrite, UA: NEGATIVE
PROTEIN UA: NEGATIVE
SPEC GRAV UA: 1.02
UROBILINOGEN UA: 0.2
pH, UA: 5.5

## 2015-05-12 LAB — FOLATE: Folate: 18.9 ng/mL (ref >5.9)

## 2015-05-12 LAB — FERRITIN: Ferritin: 52.2 ng/mL (ref 10.0–291.0)

## 2015-05-12 LAB — VITAMIN B12: VITAMIN B 12: 405 pg/mL (ref 211–911)

## 2015-05-12 MED ORDER — KETOCONAZOLE 2 % EX CREA: 1.0000 "application " | TOPICAL_CREAM | Freq: Two times a day (BID) | CUTANEOUS | Status: DC | PRN

## 2015-05-12 NOTE — Addendum Note (Signed)
Addended by: [name], STEPHEN A on: 05/12/2015 02:33 PM   Modules accepted: Orders

## 2015-05-12 NOTE — Progress Notes (Signed)
Pre visit review using our clinic review tool, if applicable. No additional management support is needed unless otherwise documented below in the visit note. 

## 2015-05-12 NOTE — Progress Notes (Signed)
   Subjective:    Patient ID: [name] MirzaGloria G Iannacone, female    DOB: 06/13/1930, 82 y.o.   MRN: [id number]  HPI Here with her husband and daughter for some recent fatigue that is unusual for her. No chest pain or palpitations or unusual SOB. She does have idiopathic pulmonary fibrosis that is closely followed by Dr. Kendrick FriesMcQuaid. She has no oter specific complaints. She denies any symptoms of depression and her family agrees that her moods are excellent. Her appetite and sleep are good. She was seen here one week ago by my partner, and her exam that day was normal with a normal pulse rate and a regular cardiac rhythm. Her labs were remarkable for an elevated MCV on a normal hemoglobin of 12.0, and some mild elevations of the transaminases. She admits to drinking 2 glasses of wine every night, and this is confirmed by her family.    Review of Systems  Constitutional: Positive for fatigue. Negative for fever, chills, diaphoresis, activity change, appetite change and unexpected weight change.  Eyes: Negative.   Respiratory: Negative.   Cardiovascular: Negative.   Endocrine: Negative.   Genitourinary: Negative.   Neurological: Negative.   Hematological: Negative.   Psychiatric/Behavioral: Negative.        Objective:   Physical Exam  Constitutional: She appears well-developed and well-nourished. No distress.  Eyes: Conjunctivae are normal. Pupils are equal, round, and reactive to light.  Neck: Neck supple. No thyromegaly present.  Cardiovascular: Intact distal pulses.   Pulse is rapid and rhythm is irregularly irregular . EKG confirms atrial fibrillation   Pulmonary/Chest: Effort normal. No respiratory distress. She has no wheezes.  Bibasilar crackles   Lymphadenopathy:    She has no cervical adenopathy.          Assessment & Plan:  Newly diagnosed atrial fibrillation with mildly elevated ventricular response rate. This is most likely the major cause for her fatigue, although she may have some  deficiencies in folate or vitamin B12 as well. We will check levels of iron, folate, and B12 today along with a UA. I asked her to stop drinking any alcohol and she agreed. We will refer her to Cardiology ASAP  Nelwyn SalisburyFRY,STEPHEN A, MD

## 2015-05-15 ENCOUNTER — Other Ambulatory Visit: Payer: Self-pay | Admitting: Physician Assistant

## 2015-05-15 ENCOUNTER — Ambulatory Visit (INDEPENDENT_AMBULATORY_CARE_PROVIDER_SITE_OTHER): Payer: Medicare Other | Admitting: Physician Assistant

## 2015-05-15 ENCOUNTER — Encounter: Payer: Self-pay | Admitting: Physician Assistant

## 2015-05-15 VITALS — BP 120/80 | HR 112 | Ht 66.0 in | Wt 149.8 lb

## 2015-05-15 DIAGNOSIS — I10 Essential (primary) hypertension: Secondary | ICD-10-CM

## 2015-05-15 DIAGNOSIS — I4891 Unspecified atrial fibrillation: Secondary | ICD-10-CM | POA: Diagnosis not present

## 2015-05-15 DIAGNOSIS — R0602 Shortness of breath: Secondary | ICD-10-CM

## 2015-05-15 MED ORDER — PANTOPRAZOLE SODIUM 40 MG PO TBEC: 40.0000 mg | DELAYED_RELEASE_TABLET | Freq: Every day | ORAL | Status: AC

## 2015-05-15 MED ORDER — METOPROLOL TARTRATE 25 MG PO TABS: 12.5000 mg | ORAL_TABLET | Freq: Two times a day (BID) | ORAL | Status: DC

## 2015-05-15 MED ORDER — APIXABAN 2.5 MG PO TABS: 2.5000 mg | ORAL_TABLET | Freq: Two times a day (BID) | ORAL | Status: DC

## 2015-05-15 NOTE — Progress Notes (Signed)
Cardiology Office Note   Date:  05/15/2015   ID:  [name] [name], DOB Sep 16, 1930, MRN [id number]  PCP:  Nelwyn Salisbury, MD  Cardiologist:  Dr. Anne Fu  Chief Complaint  Patient presents with  . Follow-up    seen for Dr. Anne Fu      History of Present Illness: [name] [name] is a 82 y.o. female who presents for cardiology office follow-up. She has past medical history of hyperlipidemia, hypertension, osteoporosis, idiopathic pulmonary fibrosis followed by Dr. Kendrick Fries, obstructive sleep apnea, history of alcohol abuse, anemia and irritable bowel syndrome. She was previously seen by Dr. Anne Fu on 02/13/2014 at which time she complained of dyspnea. Previous echocardiogram obtained on 01/06/2014 showed EF 55-60%, no regional wall motion abnormality, severely dilated left atrium, no significant valvular abnormality. She was recently evaluated by her PCP for fatigue. Lab work shows normal hemoglobin, mildly elevated transaminase, normal TSH. Urinalysis was negative for nitrite. She was evaluated again in the office on 05/12/2015, her pulse was rapid and heart rhythm was irregular. EKG confirmed atrial fibrillation.  She was referred to cardiology for further management of atrial fibrillation. She says her DOE and fatigue has been ongoing for the past month. Since no EKG was obtained on her visit on 05/04/2015, it is difficult to tell how long exactly she has been in atrial fibrillation. We obtained a repeat EKG today, unfortunately she is continued to be in atrial fibrillation with RVR with heart rate of 122. Otherwise she does not seems to be having any cardiac awareness of atrial fibrillation. She denies any chest pain, lower extremity edema, orthopnea or paroxysmal nocturnal dyspnea. She does drink wine on a daily basis and coffee as well.    Past Medical History  Diagnosis Date  . Vertebral fracture   . Depression   . Hyperlipidemia   . Hypertension   . Osteoporosis   . OSA (obstructive  sleep apnea)   . PMR (polymyalgia rheumatica) (HCC)   . Postherpetic neuralgia   . Diverticulosis of colon   . History of alcohol abuse   . Vitamin D deficiency   . Spinal stenosis   . Anemia   . Irritable bowel syndrome   . Anxiety   . Arthritis   . Thyroid disease   . IPF (idiopathic pulmonary fibrosis) (HCC)     Past Surgical History  Procedure Laterality Date  . Rotator cuff repair    . Breast biopsy    . Vertebroplasty      x 3  . Appendectomy    . Cataract extraction    . Knee arthroscopy  10/2007 and 10/2010    left  . Dilation and curettage of uterus      x several  . Replacement total knee  august 2012     Current Outpatient Prescriptions  Medication Sig Dispense Refill  . ergocalciferol (VITAMIN D2) 50000 UNITS capsule Take 1 capsule (50,000 Units total) by mouth once a week. 4 capsule 11  . escitalopram (LEXAPRO) 10 MG tablet Take 10 mg by mouth daily.    . ferrous sulfate 324 (65 FE) MG TBEC Take 1 tablet (325 mg total) by mouth 2 (two) times daily. 60 tablet 11  . furosemide (LASIX) 40 MG tablet Take 40 mg by mouth daily.    Marland Kitchen ketoconazole (NIZORAL) 2 % cream Apply 1 application topically 2 (two) times daily as needed for irritation. 30 g 2  . levothyroxine (SYNTHROID, LEVOTHROID) 75 MCG tablet Take 75 mcg by mouth daily  before breakfast.    . apixaban (ELIQUIS) 2.5 MG TABS tablet Take 1 tablet (2.5 mg total) by mouth 2 (two) times daily. 60 tablet 5  . metoprolol tartrate (LOPRESSOR) 25 MG tablet Take 0.5 tablets (12.5 mg total) by mouth 2 (two) times daily. 60 tablet 6  . pantoprazole (PROTONIX) 40 MG tablet Take 1 tablet (40 mg total) by mouth daily. 90 tablet 3   No current facility-administered medications for this visit.    Allergies:   Celecoxib; Clindamycin hcl; Rofecoxib; and Sertraline hcl    Social History:  The patient  reports that she quit smoking about 27 years ago. Her smoking use included Cigarettes. She has a 20 pack-year smoking  history. She has never used smokeless tobacco. She reports that she drinks alcohol. She reports that she does not use illicit drugs.   Family History:  The patient's family history includes Alcohol abuse in her mother; Breast cancer in her sister; Coronary artery disease (age of onset: 26) in her father; Heart attack in her father; Heart disease in her sister; Stroke (age of onset: 68) in her father. There is no history of Colon cancer.    ROS:  Please see the history of present illness.   Otherwise, review of systems are positive for fatigue and DOE.   All other systems are reviewed and negative.    PHYSICAL EXAM: VS:  BP 120/80 mmHg  Pulse 112  Ht  (1.676 m)  Wt 149 lb 12.8 oz (67.949 kg)  BMI 24.19 kg/m2  SpO2 97% , BMI Body mass index is 24.19 kg/(m^2). GEN: Well nourished, well developed, in no acute distress HEENT: normal Neck: no JVD, carotid bruits, or masses Cardiac: irregularly irregular tachycardic; no murmurs, rubs, or gallops,no edema  Respiratory:  clear to auscultation bilaterally, normal work of breathing GI: soft, nontender, nondistended, + BS MS: no deformity or atrophy Skin: warm and dry, no rash Neuro:  Strength and sensation are intact Psych: euthymic mood, full affect   EKG:  EKG is ordered today. The ekg ordered today demonstrates afib with RVR   Recent Labs: 05/04/2015: ALT 136*; BUN 28*; Creatinine, Ser 0.91; Potassium 4.5; Sodium 141; TSH 1.34 05/06/2015: Hemoglobin 12.0; Platelets 252.0    Lipid Panel    Component Value Date/Time   CHOL 206* 03/08/2010 0958   TRIG 78.0 03/08/2010 0958   HDL 74.80 03/08/2010 0958   CHOLHDL 3 03/08/2010 0958   VLDL 15.6 03/08/2010 0958   LDLDIRECT 124.9 03/08/2010 0958      Wt Readings from Last 3 Encounters:  05/15/15 149 lb 12.8 oz (67.949 kg)  05/12/15 145 lb (65.772 kg)  05/04/15 149 lb 11.2 oz (67.903 kg)      Other studies Reviewed: Additional studies/ records that were reviewed today include:     Echo 01/06/2014 LV EF: 55% -  60%  ------------------------------------------------------------------- Indications:   SOB (R06.02).  ------------------------------------------------------------------- History:  PMH: Acquired from the patient and from the patient&'s chart. Dyspnea and soft systolic murmur. Risk factors: ETOH abuse. OSA. Hypertension. Dyslipidemia.  ------------------------------------------------------------------- Study Conclusions  - Left ventricle: The cavity size was normal. Wall thickness was increased in a pattern of mild LVH. Systolic function was normal. The estimated ejection fraction was in the range of 55% to 60%. Wall motion was normal; there were no regional wall motion abnormalities. - Aortic valve: There was no stenosis. - Mitral valve: There was no significant regurgitation. - Left atrium: The atrium was severely dilated. - Right ventricle: The cavity size  was normal. Systolic function was normal. - Right atrium: The atrium was mildly dilated. - Tricuspid valve: Peak RV-RA gradient (S): 22 mm Hg. - Pulmonary arteries: PA peak pressure: 25 mm Hg (S). - Inferior vena cava: The vessel was normal in size. The respirophasic diameter changes were in the normal range (>= 50%), consistent with normal central venous pressure.  Impressions:  - Normal LV size with mild LV hypertrophy. EF 55-60%. Normal RV size and systolic function. Severe LAE. No valvular abnormalities to explain murmur.   Review of the above records demonstrates:   Patient will have idiopathic pulmonary fibrosis, hypertension and hyperlipidemia who had new onset of atrial fibrillation after being worked up for fatigue   ASSESSMENT AND PLAN:  1.  Newly diagnosed atrial fibrillation with RVR  - CHA2DS2-Vasc score 4 (age, female, HTN). This patients CHA2DS2-VASc Score and unadjusted Ischemic Stroke Rate (% per year) is equal to 4.8 % stroke rate/year from  a score of 4  Above score calculated as 1 point each if present [CHF, HTN, DM, Vascular=MI/PAD/Aortic Plaque, Age if 65-74, or Female] Above score calculated as 2 points each if present [Age > 75, or Stroke/TIA/TE]  - I will start on 12.5 mg BID of metoprolol and uptitrate as blood pressure allows.  - Although she does use a walker and appears to have some degree of imbalance, she has never fallen in the past. She did have an episode of anemia early 2016, she had a EGD back in February 2016 which did not reveal obvious bleeding source. It was suspected that she had low-grade GI blood loss from large had a hernia/Cameron errosion.   - We had a long discussion between benefit of stroke prevention and increased risk on Coumadin and NOAC, eventually after discussing with Dr. Delton SeeNelson, we agreed to give low dose eliquis a try. Unless there is proof that she can not tolerate eliquis, I think importance of stroke prevention outweigh bleeding risk. I have placed her on protonix for GI protection and will recheck CBC on next followup  - She will need close outpatient follow-up in one to 2 weeks and potentially uptitrate beta blocker.  - i will hold off on repeating an echocardiogram until her HR can be better managed. I have instructed her to cut back on EtOH and coffee. I think her chance of convert by herself is low since her previous echo in 12/2013 already showed severely dilated LA  2. Hyperlipidemia: she is not on a statin, we can recheck a fasting lipid panel on followup, note she recently has elevated transaminase (despite her drinking, her ALT actually > AST)  3. Hypothyroidism: recent TSH normal  4. idiopathic pulmonary fibrosis followed by Dr. Kendrick FriesMcQuaid   Current medicines are reviewed at length with the patient today.  The patient does not have concerns regarding medicines.  The following changes have been made:  Add metoprolol and eliquis  Labs/ tests ordered today include:   Orders Placed  This Encounter  Procedures  . CBC  . EKG 12-Lead     Disposition:   FU with Dr. Anne FuSkains' APP in 1 weeks  Signed, Azalee CourseMeng, Sheronica Corey, [place]  05/15/2015 5:56 PM    [facility] [address], [place], [place]  [address] Phone: [phone]; Fax: [phone]

## 2015-05-15 NOTE — Patient Instructions (Addendum)
Medication Instructions:    START TAKING METOPROLOL  12.5 ( HALF OF 25 MG) TWICE A DAY  . YOU HAVE BEEN GIVING A SUPPLY OF 60 TABS IN CASE DOSE CHANGES TO A WHOLE TABLET TWICE A DAY    START TAKING ELIQUIS 2.5 MG TWICE A DAY   If you need a refill on your cardiac medications before your next appointment, please call your pharmacy.  Labwork: CBC IN  ONE  WEEK  WHEN RETURN FOR  FOLLOW UP    Testing/Procedures:  NONE ORDER TODAY   Follow-Up:  IN ONE WEEK WITH APP     Any Other Special Instructions Will Be Listed Below (If Applicable).

## 2015-05-19 ENCOUNTER — Telehealth: Payer: Self-pay

## 2015-05-19 NOTE — Telephone Encounter (Signed)
Eliquis approved through 01/24/2016. [id number].

## 2015-05-24 ENCOUNTER — Other Ambulatory Visit: Payer: Self-pay | Admitting: Family Medicine

## 2015-05-24 NOTE — Progress Notes (Signed)
Cardiology Office Note    Date:  05/27/2015   ID:  [name] MirzaGloria G Bayles, DOB 11/12/1930, MRN [id number]  PCP:  Nelwyn SalisburyFRY,STEPHEN A, MD  Cardiologist:  Dr .Anne FuSkains    Follow up- newly diagnosed afib  History of Present Illness:  [name] [name] is a 82 y.o. female with a hisotry of HTN, HLD, IPF, hypothyroidism, and recently diagnosed atrial fibrillation who presents to clinic for follow up.  2D ECHO in 2015 showed normal EF with severe LAE and no valvular abnormalities.   She was previously seen by Dr. Anne FuSkains on 02/13/2014 at which time she complained of dyspnea. Previous echocardiogram obtained on 01/06/2014 showed EF 55-60%, no regional wall motion abnormality, severely dilated left atrium, no significant valvular abnormality. She was recently evaluated by her PCP for fatigue. Lab work showed normal hemoglobin, mildly elevated transaminase, normal TSH. She was evaluated again in the office on 05/12/2015, her pulse was rapid and heart rhythm was irregular. EKG confirmed atrial fibrillation.  He was seen by Azalee CourseHao Meng PA-C on 05/15/15 for follow up of newly diagnosed atrial fibrillation. She remained in afib with RVR (HR 122). She with symptomatic with fatigue and DOE. She was started on Eliquis 2.5mg  BID for a CHADSVASC of 4 and Lopressor 12.5mg  BID. She was told to cut back on alcohol and coffee.   Today she presents to clinic for follow up. She complains of feeling like she has no energy. Otherwise, she feels okay. No CP. She does get SOB, but that is not new. She has to stop several times when walking around [place]. She does not feel palpitations and has no awareness of the atrial fibrillation. No blood in her stool or urine. No dizziness or syncope. She drinks at least 2 glasses of wine a day.    Past Medical History  Diagnosis Date  . Vertebral fracture   . Depression   . Hyperlipidemia   . Hypertension   . Osteoporosis   . OSA (obstructive sleep apnea)   . PMR (polymyalgia  rheumatica) (HCC)   . Postherpetic neuralgia   . Diverticulosis of colon   . History of alcohol abuse   . Vitamin D deficiency   . Spinal stenosis   . Anemia   . Irritable bowel syndrome   . Anxiety   . Arthritis   . Thyroid disease   . IPF (idiopathic pulmonary fibrosis) (HCC)     Past Surgical History  Procedure Laterality Date  . Rotator cuff repair    . Breast biopsy    . Vertebroplasty      x 3  . Appendectomy    . Cataract extraction    . Knee arthroscopy  10/2007 and 10/2010    left  . Dilation and curettage of uterus      x several  . Replacement total knee  august 2012    Current Medications: Outpatient Prescriptions Prior to Visit  Medication Sig Dispense Refill  . apixaban (ELIQUIS) 2.5 MG TABS tablet Take 1 tablet (2.5 mg total) by mouth 2 (two) times daily. 60 tablet 5  . ergocalciferol (VITAMIN D2) 50000 UNITS capsule Take 1 capsule (50,000 Units total) by mouth once a week. 4 capsule 11  . escitalopram (LEXAPRO) 10 MG tablet Take 10 mg by mouth daily.    . ferrous sulfate 324 (65 FE) MG TBEC Take 1 tablet (325 mg total) by mouth 2 (two) times daily. 60 tablet 11  . furosemide (LASIX) 40 MG tablet Take 40 mg  by mouth daily.    Marland Kitchen ketoconazole (NIZORAL) 2 % cream Apply 1 application topically 2 (two) times daily as needed for irritation. 30 g 2  . metoprolol tartrate (LOPRESSOR) 25 MG tablet Take 0.5 tablets (12.5 mg total) by mouth 2 (two) times daily. 60 tablet 6  . pantoprazole (PROTONIX) 40 MG tablet Take 1 tablet (40 mg total) by mouth daily. 90 tablet 3  . levothyroxine (SYNTHROID, LEVOTHROID) 75 MCG tablet TAKE 1 TABLET DAILY. 30 tablet 11   No facility-administered medications prior to visit.     Allergies:   Celecoxib; Clindamycin hcl; Rofecoxib; and Sertraline hcl   Social History   Social History  . Marital Status: Married    Spouse Name: N/A  . Number of Children: N/A  . Years of Education: N/A   Social History Main Topics  . Smoking  status: Former Smoker -- 1.00 packs/day for 20 years    Types: Cigarettes    Quit date: 01/25/1988  . Smokeless tobacco: Never Used  . Alcohol Use: 0.0 oz/week    0 Standard drinks or equivalent per week     Comment: 2 glasses every day  . Drug Use: No  . Sexual Activity: Not Asked   Other Topics Concern  . None   Social History Narrative     Family History:  The patient's family history includes Alcohol abuse in her mother; Breast cancer in her sister; Coronary artery disease (age of onset: 91) in her father; Heart attack in her father; Heart disease in her sister; Stroke (age of onset: 37) in her father. There is no history of Colon cancer.   ROS:   Please see the history of present illness.    ROS All other systems reviewed and are negative.   PHYSICAL EXAM:   VS:  BP 108/60 mmHg  Pulse 108  Ht 5\' 7"  (1.702 m)  Wt 149 lb 6.4 oz (67.767 kg)  BMI 23.39 kg/m2   GEN: Well nourished, well developed, in no acute distress HEENT: normal Neck: no JVD, carotid bruits, or masses Cardiac: irreg irreg; tachy. no murmurs, rubs, or gallops,no edema  Respiratory:  clear to auscultation bilaterally, normal work of breathing GI: soft, nontender, nondistended, + BS MS: no deformity or atrophy Skin: warm and dry, no rash Neuro:  Alert and Oriented x 3, Strength and sensation are intact Psych: euthymic mood, full affect  Wt Readings from Last 3 Encounters:  05/27/15 149 lb 6.4 oz (67.767 kg)  05/15/15 149 lb 12.8 oz (67.949 kg)  05/12/15 145 lb (65.772 kg)      Studies/Labs Reviewed:   EKG:  EKG is ordered today.  The ekg ordered today demonstrates afib   Recent Labs: 05/04/2015: ALT 136*; BUN 28*; Creatinine, Ser 0.91; Potassium 4.5; Sodium 141; TSH 1.34 05/06/2015: Hemoglobin 12.0; Platelets 252.0   Lipid Panel    Component Value Date/Time   CHOL 206* 03/08/2010 0958   TRIG 78.0 03/08/2010 0958   HDL 74.80 03/08/2010 0958   CHOLHDL 3 03/08/2010 0958   VLDL 15.6 03/08/2010  0958   LDLDIRECT 124.9 03/08/2010 0958    Additional studies/ records that were reviewed today include:  Echo 01/06/2014 LV EF: 55% -  60% Study Conclusions - Left ventricle: The cavity size was normal. Wall thickness was increased in a pattern of mild LVH. Systolic function was normal. The estimated ejection fraction was in the range of 55% to 60%. Wall motion was normal; there were no regional wall motion abnormalities. - Aortic valve:  There was no stenosis. - Mitral valve: There was no significant regurgitation. - Left atrium: The atrium was severely dilated. - Right ventricle: The cavity size was normal. Systolic function was normal. - Right atrium: The atrium was mildly dilated. - Tricuspid valve: Peak RV-RA gradient (S): 22 mm Hg. - Pulmonary arteries: PA peak pressure: 25 mm Hg (S). - Inferior vena cava: The vessel was normal in size. The respirophasic diameter changes were in the normal range (>= 50%), consistent with normal central venous pressure. Impressions: - Normal LV size with mild LV hypertrophy. EF 55-60%. Normal RV size and systolic function. Severe LAE. No valvular abnormalities to explain murmur.    ASSESSMENT:    1. PAF (paroxysmal atrial fibrillation) (HCC)   2. HLD (hyperlipidemia)   3. Hypothyroidism, unspecified hypothyroidism type   4. Idiopathic pulmonary fibrosis (HCC)      PLAN:  In order of problems listed above:  Newly diagnosed atrial fibrillation with RVR: symptomatic with fatigue. She and her family are convinced this is due to the atrial fibrillation  -- Currently on 12.5 mg BID of metoprolol with moderate rate control . She has soft BP and I do not have a lot of room to increase this.  -- Although she does use a walker and appears to have some degree of imbalance, she has never fallen in the past. She did have an episode of anemia early 2016, she had a EGD back in 02/2014 which did not reveal obvious bleeding source.  It was suspected that she had low-grade GI blood loss from large had a hernia/Cameron errosion. CHA2DS2-Vasc score 4 (age, female, HTN). Recently started on low dose Eliquis 2.5mg  BID. Will check CBC today.  -- Will order repeat 2D ECHO now that she has better rate controlled. I think her chance of converting by herself is low since her previous echo in 12/2013 already showed severely dilated LA. Will have her take a whole lopressor ( ) prior to 2D ECHO and then follow up with me in 1 week to discuss options.   HLD: she is not on a statin, we can recheck a fasting lipid panel at earliest convenience. Note she recently has elevated transaminase (despite her drinking, her ALT actually > AST)  Hypothyroidism: recent TSH normal  Idiopathic pulmonary fibrosis: followed by Dr. Kendrick Fries    Medication Adjustments/Labs and Tests Ordered: Current medicines are reviewed at length with the patient today.  Concerns regarding medicines are outlined above.  Medication changes, Labs and Tests ordered today are listed in the Patient Instructions below. Patient Instructions  Medication Instructions:  Your physician recommends that you continue on your current medications as directed. Please refer to the Current Medication list given to you today.   Labwork: TODAY:  CBC  Testing/Procedures: Your physician has requested that you have an echocardiogram BEFORE APPT WITH KATIE Quantae Martel, PA-C AROUND 10 IN A MORNING.  ON THE DAY OF YOUR ECHO, YOU ARE TO TAKE A WHOLE LOPRESSOR.  Echocardiography is a painless test that uses sound waves to create images of your heart. It provides your doctor with information about the size and shape of your heart and how well your heart's chambers and valves are working. This procedure takes approximately one hour. There are no restrictions for this procedure.  Follow-Up: Your physician recommends that you schedule a follow-up appointment in: 1 WEEK WITH KATIE Any Mcneice, PA-C (FLEX  IS OK)   Any Other Special Instructions Will Be Listed Below (If Applicable).    If you need  a refill on your cardiac medications before your next appointment, please call your pharmacy.       Charlestine Massed  05/27/2015 4:18 PM    [facility] [address] [place], [place], [place]  [address] Phone: [phone]; Fax: [phone]

## 2015-05-25 NOTE — Telephone Encounter (Signed)
Can we refill this? 

## 2015-05-27 ENCOUNTER — Other Ambulatory Visit (INDEPENDENT_AMBULATORY_CARE_PROVIDER_SITE_OTHER): Payer: Medicare Other | Admitting: *Deleted

## 2015-05-27 ENCOUNTER — Ambulatory Visit (INDEPENDENT_AMBULATORY_CARE_PROVIDER_SITE_OTHER): Payer: Medicare Other | Admitting: Physician Assistant

## 2015-05-27 ENCOUNTER — Encounter: Payer: Self-pay | Admitting: Physician Assistant

## 2015-05-27 VITALS — BP 108/60 | HR 108 | Ht 67.0 in | Wt 149.4 lb

## 2015-05-27 DIAGNOSIS — E785 Hyperlipidemia, unspecified: Secondary | ICD-10-CM

## 2015-05-27 DIAGNOSIS — J84112 Idiopathic pulmonary fibrosis: Secondary | ICD-10-CM | POA: Diagnosis not present

## 2015-05-27 DIAGNOSIS — I4891 Unspecified atrial fibrillation: Secondary | ICD-10-CM | POA: Diagnosis not present

## 2015-05-27 DIAGNOSIS — E039 Hypothyroidism, unspecified: Secondary | ICD-10-CM

## 2015-05-27 DIAGNOSIS — I480 Paroxysmal atrial fibrillation: Secondary | ICD-10-CM | POA: Diagnosis not present

## 2015-05-27 LAB — CBC
HEMATOCRIT: 37.6 % (ref 35.0–45.0)
Hemoglobin: 12.5 g/dL (ref 11.7–15.5)
MCH: 34.5 pg — ABNORMAL HIGH (ref 27.0–33.0)
MCHC: 33.2 g/dL (ref 32.0–36.0)
MCV: 103.9 fL — AB (ref 80.0–100.0)
MPV: 11.1 fL (ref 7.5–12.5)
Platelets: 199 10*3/uL (ref 140–400)
RBC: 3.62 MIL/uL — AB (ref 3.80–5.10)
RDW: 18.9 % — ABNORMAL HIGH (ref 11.0–15.0)
WBC: 6.7 10*3/uL (ref 3.8–10.8)

## 2015-05-27 NOTE — Patient Instructions (Addendum)
Medication Instructions:  Your physician recommends that you continue on your current medications as directed. Please refer to the Current Medication list given to you today.   Labwork: TODAY:  CBC  Testing/Procedures: Your physician has requested that you have an echocardiogram BEFORE APPT WITH KATIE THOMPSON, PA-C AROUND 10 IN A MORNING.  ON THE DAY OF YOUR ECHO, YOU ARE TO TAKE A WHOLE LOPRESSOR.  Echocardiography is a painless test that uses sound waves to create images of your heart. It provides your doctor with information about the size and shape of your heart and how well your heart's chambers and valves are working. This procedure takes approximately one hour. There are no restrictions for this procedure.  Follow-Up: Your physician recommends that you schedule a follow-up appointment in: 1 WEEK WITH KATIE THOMPSON, PA-C (FLEX IS OK)   Any Other Special Instructions Will Be Listed Below (If Applicable).    If you need a refill on your cardiac medications before your next appointment, please call your pharmacy.

## 2015-06-01 ENCOUNTER — Telehealth: Payer: Self-pay | Admitting: Family Medicine

## 2015-06-01 NOTE — Telephone Encounter (Signed)
Do you have a copy of orders for pt that were faxed? Most likely Dr. Clent RidgesFry signed and it was sent back up front.

## 2015-06-01 NOTE — Telephone Encounter (Signed)
Fredia SorrowGerry w/ [facility] states they received orders on Friday for pt OT therapy. However they could not read the orders and want to know if you can refax?  Fax:  [phone]

## 2015-06-03 ENCOUNTER — Ambulatory Visit (HOSPITAL_COMMUNITY)
Admission: RE | Admit: 2015-06-03 | Discharge: 2015-06-03 | Disposition: A | Discharge status: Home / Self Care | Payer: Medicare Other | Source: Ambulatory Visit | Attending: Physician Assistant | Admitting: Physician Assistant

## 2015-06-03 DIAGNOSIS — E785 Hyperlipidemia, unspecified: Secondary | ICD-10-CM | POA: Diagnosis not present

## 2015-06-03 DIAGNOSIS — I119 Hypertensive heart disease without heart failure: Secondary | ICD-10-CM | POA: Diagnosis not present

## 2015-06-03 DIAGNOSIS — I071 Rheumatic tricuspid insufficiency: Secondary | ICD-10-CM | POA: Insufficient documentation

## 2015-06-03 DIAGNOSIS — I4891 Unspecified atrial fibrillation: Secondary | ICD-10-CM | POA: Diagnosis present

## 2015-06-03 DIAGNOSIS — I313 Pericardial effusion (noninflammatory): Secondary | ICD-10-CM | POA: Diagnosis not present

## 2015-06-03 DIAGNOSIS — I351 Nonrheumatic aortic (valve) insufficiency: Secondary | ICD-10-CM | POA: Diagnosis not present

## 2015-06-03 DIAGNOSIS — I480 Paroxysmal atrial fibrillation: Secondary | ICD-10-CM | POA: Insufficient documentation

## 2015-06-03 DIAGNOSIS — I340 Nonrheumatic mitral (valve) insufficiency: Secondary | ICD-10-CM | POA: Insufficient documentation

## 2015-06-03 NOTE — Progress Notes (Signed)
  Echocardiogram 2D Echocardiogram has been performed.  Leta JunglingCooper, Joud Pettinato M 06/03/2015, 12:07 PM

## 2015-06-04 NOTE — Progress Notes (Signed)
Cardiology Office Note    Date:  06/05/2015   ID:  [name] [name], DOB 1930-03-01, MRN [id number]  PCP:  Nelwyn Salisbury, MD  Cardiologist:  Dr .Anne Fu    Follow up- afib and new low EF.   History of Present Illness:  [name] [name] is a 82 y.o. female with a hisotry of HTN, HLD, IPF, hypothyroidism, and recently diagnosed atrial fibrillation who presents to clinic for follow up.  2D ECHO in 2015 showed normal EF with severe LAE and no valvular abnormalities.   She was previously seen by Dr. Anne Fu on 02/13/2014 at which time she complained of dyspnea. Previous echocardiogram obtained on 01/06/2014 showed EF 55-60%, no regional wall motion abnormality, severely dilated left atrium, no significant valvular abnormality. She was recently evaluated by her PCP for fatigue. Lab work showed normal hemoglobin, mildly elevated transaminase, normal TSH. She was evaluated again in the office on 05/12/2015, her pulse was rapid and heart rhythm was irregular. EKG confirmed atrial fibrillation.  He was seen by Azalee Course PA-C on 05/15/15 for follow up of newly diagnosed atrial fibrillation. She remained in afib with RVR (HR 122). She with symptomatic with fatigue and DOE. She was started on Eliquis 2.5mg  BID for a CHADSVASC of 4 and Lopressor 12.5mg  BID. She was told to cut back on alcohol and coffee.   I saw her in clinic on 05/27/15 for follow up. She continued to feel very fatigued which she was convinced was due to afib. She did not feel palpitations and has no awareness of the atrial fibrillation. Her HR was 108 but I could not increase BB due to soft BPs. I ordered a 2D ECHO which showed EF which revealed new cardiomyopathy with EF 25-30% w. Global HK, mild AR, mild MR, severely dilated LA (76ml/m2), mild RV dilation and mildly reduced RV function, severely dilated RA, PA pressure 64.   Today she presents to clinic for follow up. She is feeling fine. Her fatigue is much better. No lower extremity edema,  orthopnea or PND. No dizziness or syncope. No blood in her stool or urine.   Past Medical History  Diagnosis Date  . Vertebral fracture   . Depression   . Hyperlipidemia   . Hypertension   . Osteoporosis   . OSA (obstructive sleep apnea)   . PMR (polymyalgia rheumatica) (HCC)   . Postherpetic neuralgia   . Diverticulosis of colon   . History of alcohol abuse   . Vitamin D deficiency   . Spinal stenosis   . Anemia   . Irritable bowel syndrome   . Anxiety   . Arthritis   . Thyroid disease   . IPF (idiopathic pulmonary fibrosis) (HCC)     Past Surgical History  Procedure Laterality Date  . Rotator cuff repair    . Breast biopsy    . Vertebroplasty      x 3  . Appendectomy    . Cataract extraction    . Knee arthroscopy  10/2007 and 10/2010    left  . Dilation and curettage of uterus      x several  . Replacement total knee  august 2012    Current Medications: Outpatient Prescriptions Prior to Visit  Medication Sig Dispense Refill  . apixaban (ELIQUIS) 2.5 MG TABS tablet Take 1 tablet (2.5 mg total) by mouth 2 (two) times daily. 60 tablet 5  . ergocalciferol (VITAMIN D2) 50000 UNITS capsule Take 1 capsule (50,000 Units total) by mouth once a week.  4 capsule 11  . escitalopram (LEXAPRO) 10 MG tablet Take 10 mg by mouth daily.    . ferrous sulfate 324 (65 FE) MG TBEC Take 1 tablet (325 mg total) by mouth 2 (two) times daily. 60 tablet 11  . furosemide (LASIX) 40 MG tablet Take 40 mg by mouth daily.    Marland Kitchen. ketoconazole (NIZORAL) 2 % cream Apply 1 application topically 2 (two) times daily as needed for irritation. 30 g 2  . levothyroxine (SYNTHROID, LEVOTHROID) 75 MCG tablet Take 75 mcg by mouth daily before breakfast.    . pantoprazole (PROTONIX) 40 MG tablet Take 1 tablet (40 mg total) by mouth daily. 90 tablet 3  . metoprolol tartrate (LOPRESSOR) 25 MG tablet Take 0.5 tablets (12.5 mg total) by mouth 2 (two) times daily. 60 tablet 6   No facility-administered medications  prior to visit.     Allergies:   Celecoxib; Clindamycin hcl; Rofecoxib; and Sertraline hcl   Social History   Social History  . Marital Status: Married    Spouse Name: N/A  . Number of Children: N/A  . Years of Education: N/A   Social History Main Topics  . Smoking status: Former Smoker -- 1.00 packs/day for 20 years    Types: Cigarettes    Quit date: 01/25/1988  . Smokeless tobacco: Never Used  . Alcohol Use: 0.0 oz/week    0 Standard drinks or equivalent per week     Comment: 2 glasses every day  . Drug Use: No  . Sexual Activity: Not Asked   Other Topics Concern  . None   Social History Narrative     Family History:  The patient's family history includes Alcohol abuse in her mother; Breast cancer in her sister; Coronary artery disease (age of onset: 9340) in her father; Heart attack in her father; Heart disease in her sister; Stroke (age of onset: 3376) in her father. There is no history of Colon cancer.   ROS:   Please see the history of present illness.    ROS All other systems reviewed and are negative.   PHYSICAL EXAM:   VS:  BP 112/76 mmHg  Pulse 98  Ht 5\' 6"  (1.676 m)  Wt 43 lb (19.505 kg)  BMI 6.94 kg/m2   GEN: Well nourished, well developed, in no acute distress HEENT: normal Neck: no JVD, carotid bruits, or masses Cardiac: irreg irreg; tachy. no murmurs, rubs, or gallops,no edema  Respiratory:  clear to auscultation bilaterally, normal work of breathing GI: soft, nontender, nondistended, + BS MS: no deformity or atrophy Skin: warm and dry, no rash Neuro:  Alert and Oriented x 3, Strength and sensation are intact Psych: euthymic mood, full affect  Wt Readings from Last 3 Encounters:  06/05/15 43 lb (19.505 kg)  05/27/15 149 lb 6.4 oz (67.767 kg)  05/15/15 149 lb 12.8 oz (67.949 kg)      Studies/Labs Reviewed:   EKG:  EKG is ordered today.    Recent Labs: 05/04/2015: ALT 136*; BUN 28*; Creatinine, Ser 0.91; Potassium 4.5; Sodium 141; TSH  1.34 05/27/2015: Hemoglobin 12.5; Platelets 199   Lipid Panel    Component Value Date/Time   CHOL 206* 03/08/2010 0958   TRIG 78.0 03/08/2010 0958   HDL 74.80 03/08/2010 0958   CHOLHDL 3 03/08/2010 0958   VLDL 15.6 03/08/2010 0958   LDLDIRECT 124.9 03/08/2010 0958    Additional studies/ records that were reviewed today include:  Echo 01/06/2014 LV EF: 55% -  60% Study Conclusions -  Left ventricle: The cavity size was normal. Wall thickness was increased in a pattern of mild LVH. Systolic function was normal. The estimated ejection fraction was in the range of 55% to 60%. Wall motion was normal; there were no regional wall motion abnormalities. - Aortic valve: There was no stenosis. - Mitral valve: There was no significant regurgitation. - Left atrium: The atrium was severely dilated. - Right ventricle: The cavity size was normal. Systolic function was normal. - Right atrium: The atrium was mildly dilated. - Tricuspid valve: Peak RV-RA gradient (S): 22 mm Hg. - Pulmonary arteries: PA peak pressure: 25 mm Hg (S). - Inferior vena cava: The vessel was normal in size. The respirophasic diameter changes were in the normal range (>= 50%), consistent with normal central venous pressure. Impressions: - Normal LV size with mild LV hypertrophy. EF 55-60%. Normal RV size and systolic function. Severe LAE. No valvular abnormalities to explain murmur.  2D ECHO 06/03/15 LV EF: 25% - 30% Study Conclusions - Left ventricle: The cavity size was normal. Wall thickness was  increased in a pattern of moderate LVH. Systolic function was  severely reduced. The estimated ejection fraction was in the  range of 25% to 30%. Diffuse hypokinesis. The study is not  technically sufficient to allow evaluation of LV diastolic  function. - Aortic valve: Trileaflet; mildly calcified leaflets. There was no  stenosis. There was mild regurgitation. - Mitral valve: Calcified  annulus. Mildly thickened leaflets .  There was mild regurgitation. - Left atrium: Severely dilated at 55 ml/m2. - Right ventricle: The cavity size was moderately dilated. Systolic  function is reduced. - Right atrium: Severely dilated at 28 cm2. - Tricuspid valve: There was moderate regurgitation. - Pulmonary arteries: PA peak pressure: 64 mm Hg (S). - Inferior vena cava: The vessel was normal in size. The  respirophasic diameter changes were in the normal range (>= 50%),  consistent with normal central venous pressure. - Pericardium, extracardiac: A trivial pericardial effusion was  identified posterior to the heart. Features were not consistent  with tamponade physiology. Impressions - Compared to a prior echo in 2015, the LVEF has reduced from  55-60% to 25-30% wtih global hypokinesis. Atrial fibrillation is  noted.    ASSESSMENT:    1. PAF (paroxysmal atrial fibrillation) (HCC)   2. Cardiomyopathy (HCC)   3. HLD (hyperlipidemia)   4. Hypothyroidism, unspecified hypothyroidism type   5. Idiopathic pulmonary fibrosis (HCC)      PLAN:  In order of problems listed above:  Newly diagnosed atrial fibrillation with RVR:  -- Currently on 12.5 mg BID of metoprolol with moderate rate control. I will switch this to Coreg 6.25mg  BID with newly diagnosed cardiomyopathy. -- CHA2DS2-Vasc score 4 (age, female, HTN). Recently started on low dose Eliquis 2.5mg  BID. -- I think her chance of converting by herself is low with severely dilated LA. AAD limited with newly diagnosed cardiomyopathy and pulmonary fibrosis. ? Tikosyn only option? Initially she was very fatigued with atrial fibrillation but think this was related to her fast heart rates. For now she is feeling much better so we will pursue a rate control approach  Cardiomyopathy: most recent ECHO reveals newly reduced EF to 25-30% with global HK. Possibly tachycardia induced? She is currently lasix  daily. We went over  daily weights and salt restrictions.  Will switch her Lopressor 12.5mg  BID to Coreg 6.25mg  BID. No room in BP to add ACE/aRB  HLD: she is not on a statin, we can recheck a  fasting lipid panel at earliest convenience.   Hypothyroidism: recent TSH normal  Idiopathic pulmonary fibrosis: followed by Dr. Kendrick Fries   Medication Adjustments/Labs and Tests Ordered: Current medicines are reviewed at length with the patient today.  Concerns regarding medicines are outlined above.  Medication changes, Labs and Tests ordered today are listed in the Patient Instructions below. Patient Instructions  Medication Instructions:  Your physician has recommended you make the following change in your medication:  1.  STOP the Lopressor 2.  START the Coreg 6.25 mg taking 1 tablet twice a day   Labwork: None ordered  Testing/Procedures: None orderEd  Follow-Up: Your physician recommends that you schedule a follow-up appointment in: 06-25-15 WITH DR. Anne Fu   Any Other Special Instructions Will Be Listed Below (If Applicable).     If you need a refill on your cardiac medications before your next appointment, please call your pharmacy.       Signed, Cline Crock, PA-C  06/05/2015 2:09 PM    [facility] [address] [place], [place], [place]  [address] Phone: [phone]; Fax: [phone]

## 2015-06-05 ENCOUNTER — Encounter: Payer: Self-pay | Admitting: Physician Assistant

## 2015-06-05 ENCOUNTER — Ambulatory Visit (INDEPENDENT_AMBULATORY_CARE_PROVIDER_SITE_OTHER): Payer: Medicare Other | Admitting: Physician Assistant

## 2015-06-05 VITALS — BP 112/76 | HR 98 | Ht 66.0 in | Wt <= 1120 oz

## 2015-06-05 DIAGNOSIS — J84112 Idiopathic pulmonary fibrosis: Secondary | ICD-10-CM

## 2015-06-05 DIAGNOSIS — E039 Hypothyroidism, unspecified: Secondary | ICD-10-CM

## 2015-06-05 DIAGNOSIS — E785 Hyperlipidemia, unspecified: Secondary | ICD-10-CM

## 2015-06-05 DIAGNOSIS — I480 Paroxysmal atrial fibrillation: Secondary | ICD-10-CM

## 2015-06-05 DIAGNOSIS — I429 Cardiomyopathy, unspecified: Secondary | ICD-10-CM | POA: Diagnosis not present

## 2015-06-05 MED ORDER — CARVEDILOL 6.25 MG PO TABS: 6.2500 mg | ORAL_TABLET | Freq: Two times a day (BID) | ORAL | Status: DC

## 2015-06-05 NOTE — Patient Instructions (Signed)
Medication Instructions:  Your physician has recommended you make the following change in your medication:  1.  STOP the Lopressor 2.  START the Coreg 6.25 mg taking 1 tablet twice a day   Labwork: None ordered  Testing/Procedures: None orderEd  Follow-Up: Your physician recommends that you schedule a follow-up appointment in: 06-25-15 WITH DR. Anne FuSKAINS   Any Other Special Instructions Will Be Listed Below (If Applicable).     If you need a refill on your cardiac medications before your next appointment, please call your pharmacy.

## 2015-06-07 ENCOUNTER — Other Ambulatory Visit: Payer: Self-pay | Admitting: Family Medicine

## 2015-06-10 NOTE — Telephone Encounter (Signed)
Was able to get copy of orders and made them legible. [facility]

## 2015-06-11 ENCOUNTER — Other Ambulatory Visit (HOSPITAL_COMMUNITY): Payer: Medicare Other

## 2015-06-18 ENCOUNTER — Ambulatory Visit: Payer: Medicare Other | Admitting: Physician Assistant

## 2015-06-21 ENCOUNTER — Other Ambulatory Visit: Payer: Self-pay | Admitting: Family Medicine

## 2015-06-23 ENCOUNTER — Other Ambulatory Visit: Payer: Self-pay | Admitting: General Practice

## 2015-06-23 MED ORDER — ERGOCALCIFEROL 1.25 MG (50000 UT) PO CAPS: 50000.0000 [IU] | ORAL_CAPSULE | ORAL | Status: DC

## 2015-06-25 ENCOUNTER — Ambulatory Visit (INDEPENDENT_AMBULATORY_CARE_PROVIDER_SITE_OTHER): Payer: Medicare Other | Admitting: Cardiology

## 2015-06-25 ENCOUNTER — Encounter: Payer: Self-pay | Admitting: Cardiology

## 2015-06-25 VITALS — BP 102/64 | HR 68 | Ht 66.0 in | Wt 147.5 lb

## 2015-06-25 DIAGNOSIS — I480 Paroxysmal atrial fibrillation: Secondary | ICD-10-CM

## 2015-06-25 DIAGNOSIS — I429 Cardiomyopathy, unspecified: Secondary | ICD-10-CM | POA: Diagnosis not present

## 2015-06-25 DIAGNOSIS — R0602 Shortness of breath: Secondary | ICD-10-CM | POA: Diagnosis not present

## 2015-06-25 MED ORDER — CARVEDILOL 6.25 MG PO TABS: 6.2500 mg | ORAL_TABLET | Freq: Two times a day (BID) | ORAL | Status: DC

## 2015-06-25 NOTE — Progress Notes (Signed)
[address] [place], [place]  [address] Phone: [phone]) [phone] Fax:  [phone]  Date:  06/25/2015   ID:  [name] MirzaGloria G Rayos, DOB 08/24/1930, MRN [id number]  PCP:  Nelwyn SalisburyFRY,STEPHEN A, MD   History of Present Illness: [name] [name] is a 82 y.o. female here for follow up of chronic systolic heart failure EF 25-30% in the setting of atrial fibrillation on chronic anticoagulation.  Pulmonary studies point toward interstial lung disease.  Original consultation-[place] and has noted increasing dyspnea when going to [employer]. Her apartment is on the opposite side of the complex. She has to stop a few times and often does not wish to go to the dining room because of her shortness of breath. She has noted this is been going on over the past several months. She has not had any fevers but she does have productive mucus at times. Her cough seems to be "deep. She does not have any chest discomfort associated with her exertion. She ambulates with a cane and often will use a transportation wheelchair. Her mobility is quite limited at baseline. Married, her husband is with her and her daughter is also with her today.  She is had no prior heart history. She used to smoke for several years, "I love my [place] slims ". She has had no prior strokes.  Since discovery of atrial fibrillation-she has been on Eliquis and is doing well. Carvedilol was started because of cardiomyopathy. I am pleased. No orthopnea. No PND. Her husband Leonette MostCharles is with her today.  Wt Readings from Last 3 Encounters:  06/25/15 147 lb 8 oz (66.906 kg)  06/05/15 43 lb (19.505 kg)  05/27/15 149 lb 6.4 oz (67.767 kg)     Past Medical History  Diagnosis Date  . Vertebral fracture   . Depression   . Hyperlipidemia   . Hypertension   . Osteoporosis   . OSA (obstructive sleep apnea)   . PMR (polymyalgia rheumatica) (HCC)   . Postherpetic neuralgia   . Diverticulosis of colon   . History of alcohol  abuse   . Vitamin D deficiency   . Spinal stenosis   . Anemia   . Irritable bowel syndrome   . Anxiety   . Arthritis   . Thyroid disease   . IPF (idiopathic pulmonary fibrosis) (HCC)     Past Surgical History  Procedure Laterality Date  . Rotator cuff repair    . Breast biopsy    . Vertebroplasty      x 3  . Appendectomy    . Cataract extraction    . Knee arthroscopy  10/2007 and 10/2010    left  . Dilation and curettage of uterus      x several  . Replacement total knee  august 2012    Current Outpatient Prescriptions  Medication Sig Dispense Refill  . apixaban (ELIQUIS) 2.5 MG TABS tablet Take 1 tablet (2.5 mg total) by mouth 2 (two) times daily. 60 tablet 5  . ergocalciferol (VITAMIN D2) 50000 units capsule Take 1 capsule (50,000 Units total) by mouth once a week. 4 capsule 3  . escitalopram (LEXAPRO) 10 MG tablet Take 10 mg by mouth daily.    . ferrous sulfate 324 (65 FE) MG TBEC Take 1 tablet (325 mg total) by mouth 2 (two) times daily. 60 tablet 11  . furosemide (LASIX) 40 MG tablet TAKE 1 TABLET DAILY. 30 tablet 11  . ketoconazole (NIZORAL)  2 % cream Apply 1 application topically 2 (two) times daily as needed for irritation. 30 g 2  . levothyroxine (SYNTHROID, LEVOTHROID) 75 MCG tablet Take 75 mcg by mouth daily before breakfast.    . pantoprazole (PROTONIX) 40 MG tablet Take 1 tablet (40 mg total) by mouth daily. 90 tablet 3  . carvedilol (COREG) 6.25 MG tablet Take 1 tablet (6.25 mg total) by mouth 2 (two) times daily. 180 tablet 3   No current facility-administered medications for this visit.    Allergies:    Allergies  Allergen Reactions  . Celecoxib     REACTION: unspecified  . Clindamycin Hcl     REACTION: diarrhea  . Rofecoxib     REACTION: unspecified  . Sertraline Hcl     REACTION: unspecified    Social History:  The patient  reports that she quit smoking about 27 years ago. Her smoking use included Cigarettes. She has a 20 pack-year smoking  history. She has never used smokeless tobacco. She reports that she drinks alcohol. She reports that she does not use illicit drugs.   Family History  Problem Relation Age of Onset  . Stroke Father 59  . Heart attack Father   . Coronary artery disease Father 64  . Alcohol abuse Mother   . Breast cancer Sister   . Heart disease Sister   . Colon cancer Neg Hx     ROS:  Please see the history of present illness.   Denies any fevers, chills, orthopnea, PND. Positive cough, mucus production, dyspnea. No syncope, no rashes, no blood loss   All other systems reviewed and negative.   PHYSICAL EXAM: VS:  BP 102/64 mmHg  Pulse 68  Ht 5\' 6"  (1.676 m)  Wt 147 lb 8 oz (66.906 kg)  BMI 23.82 kg/m2 Well nourished, well developed, in no acute distressElderly, walks with a cane. HEENT: normal, [place]/AT, EOMI Neck: no JVD, normal carotid upstroke, no bruit Cardiac:  normal S1, S2; RRR; soft systolic murmurEctopy noted during auscultation, occasional short bursts of tachycardia. Lungs: Mild rhonchi/ velcro like heard bilateral bases upon deep inspiration. No active wheezing  Abd: soft, nontender, no hepatomegaly, no bruits Ext: no edema, 2+ distal pulses Skin: warm and dry GU: deferred Neuro: no focal abnormalities noted, AAO x 3  EKG:  Sinus rhythm with premature atrial contraction noted as well as paroxysmal atrial tachycardia. No atrial fibrillation. Nonspecific ST-T wave changes.     ECHO: 06/03/15: - - Left ventricle: The cavity size was normal. Wall thickness was  increased in a pattern of moderate LVH. Systolic function was  severely reduced. The estimated ejection fraction was in the  range of 25% to 30%. Diffuse hypokinesis. The study is not  technically sufficient to allow evaluation of LV diastolic  function. - Aortic valve: Trileaflet; mildly calcified leaflets. There was no  stenosis. There was mild regurgitation. - Mitral valve: Calcified annulus. Mildly thickened leaflets  .  There was mild regurgitation. - Left atrium: Severely dilated at 55 ml/m2. - Right ventricle: The cavity size was moderately dilated. Systolic  function is reduced. - Right atrium: Severely dilated at 28 cm2. - Tricuspid valve: There was moderate regurgitation. - Pulmonary arteries: PA peak pressure: 64 mm Hg (S). - Inferior vena cava: The vessel was normal in size. The  respirophasic diameter changes were in the normal range (>= 50%),  consistent with normal central venous pressure. - Pericardium, extracardiac: A trivial pericardial effusion was  identified posterior to the heart. Features  were not consistent  with tamponade physiology.  Impressions:  - Compared to a prior echo in 2015, the LVEF has reduced from  55-60% to 25-30% wtih global hypokinesis. Atrial fibrillation is  noted.  ASSESSMENT AND PLAN:  Chronic systolic heart failure/cardiomyopathy - Continue with carvedilol 6.25 mg twice a day - Lasix 40 mg once a day -Avoiding ACE inhibitor at this time, renal function, blood pressure -May in part be from rapid ventricular response atrial fibrillation.  Persistent atrial fibrillation- - well rate controlled currently on carvedilol. - No changes. Continue with rate control approach. Previous fatigue has improved. - No cardioversion. Feels better. - CHA2DS2-Vasc score 4 (age, female, HTN). Recently started on low dose Eliquis 2.5mg  BID.  Former smoker/interstitial lung disease - Pulmonary. Dr. Kendrick Fries.   Signed, Donato Schultz, MD [facility]  06/25/2015 3:09 PM

## 2015-06-25 NOTE — Patient Instructions (Signed)

## 2015-06-26 ENCOUNTER — Telehealth: Payer: Self-pay | Admitting: Family Medicine

## 2015-06-26 NOTE — Telephone Encounter (Signed)
[facility] call to say she had OT with the pt today. FYI; She call to say pt face flush red and has been since yesterday . They are not sure what this is coming from    [phone]

## 2015-06-27 ENCOUNTER — Encounter (HOSPITAL_COMMUNITY): Payer: Self-pay

## 2015-06-27 ENCOUNTER — Emergency Department (HOSPITAL_COMMUNITY)
Admission: EM | Admit: 2015-06-27 | Discharge: 2015-06-27 | Disposition: A | Discharge status: Home / Self Care | Payer: Medicare Other | Attending: Emergency Medicine | Admitting: Emergency Medicine

## 2015-06-27 DIAGNOSIS — Z8709 Personal history of other diseases of the respiratory system: Secondary | ICD-10-CM | POA: Insufficient documentation

## 2015-06-27 DIAGNOSIS — Z862 Personal history of diseases of the blood and blood-forming organs and certain disorders involving the immune mechanism: Secondary | ICD-10-CM | POA: Diagnosis not present

## 2015-06-27 DIAGNOSIS — F419 Anxiety disorder, unspecified: Secondary | ICD-10-CM | POA: Insufficient documentation

## 2015-06-27 DIAGNOSIS — R21 Rash and other nonspecific skin eruption: Secondary | ICD-10-CM

## 2015-06-27 DIAGNOSIS — E559 Vitamin D deficiency, unspecified: Secondary | ICD-10-CM | POA: Diagnosis not present

## 2015-06-27 DIAGNOSIS — I10 Essential (primary) hypertension: Secondary | ICD-10-CM | POA: Diagnosis not present

## 2015-06-27 DIAGNOSIS — Z7901 Long term (current) use of anticoagulants: Secondary | ICD-10-CM | POA: Insufficient documentation

## 2015-06-27 DIAGNOSIS — Z79899 Other long term (current) drug therapy: Secondary | ICD-10-CM | POA: Insufficient documentation

## 2015-06-27 DIAGNOSIS — Z8781 Personal history of (healed) traumatic fracture: Secondary | ICD-10-CM | POA: Diagnosis not present

## 2015-06-27 DIAGNOSIS — Z87891 Personal history of nicotine dependence: Secondary | ICD-10-CM | POA: Insufficient documentation

## 2015-06-27 DIAGNOSIS — F329 Major depressive disorder, single episode, unspecified: Secondary | ICD-10-CM | POA: Diagnosis not present

## 2015-06-27 DIAGNOSIS — E079 Disorder of thyroid, unspecified: Secondary | ICD-10-CM | POA: Diagnosis not present

## 2015-06-27 DIAGNOSIS — L309 Dermatitis, unspecified: Secondary | ICD-10-CM | POA: Insufficient documentation

## 2015-06-27 MED ORDER — DESONIDE 0.05 % EX LOTN: TOPICAL_LOTION | Freq: Two times a day (BID) | CUTANEOUS | Status: DC

## 2015-06-27 NOTE — ED Notes (Signed)
Patient here with red, itchy, dry rash to face x 3 days. No known exposure to anything new. Has been applying cream to same with no relief

## 2015-06-27 NOTE — ED Provider Notes (Signed)
CSN: [id number]     Arrival date & time 06/27/15  1044 History   First MD Initiated Contact with Patient 06/27/15 1132     Chief Complaint  Patient presents with  . Rash     (Consider location/radiation/quality/duration/timing/severity/associated sxs/prior Treatment) Patient is a 82 y.o. female presenting with rash.  Rash Location:  Face Facial rash location:  Face Quality: dryness and itchiness   Severity:  Moderate Onset quality:  Gradual Duration:  4 days Timing:  Constant Progression:  Worsening Chronicity:  New Relieved by:  Nothing Worsened by:  Nothing tried Ineffective treatments:  None tried Associated symptoms: no fever, no headaches, no joint pain, no myalgias, no nausea, no shortness of breath, not vomiting and not wheezing     82 yo F With a chief complaint of a facial rash. This describes itchy and dry. This been going on for about 4 days. Denies any new creams or lotions. Denies any new soaps. Patient did start Coreg about 2 weeks ago. Denies any intraoral involvement. Denies vaginal or rectal pain. Denies any other areas of rash. Past Medical History  Diagnosis Date  . Vertebral fracture   . Depression   . Hyperlipidemia   . Hypertension   . Osteoporosis   . OSA (obstructive sleep apnea)   . PMR (polymyalgia rheumatica) (HCC)   . Postherpetic neuralgia   . Diverticulosis of colon   . History of alcohol abuse   . Vitamin D deficiency   . Spinal stenosis   . Anemia   . Irritable bowel syndrome   . Anxiety   . Arthritis   . Thyroid disease   . IPF (idiopathic pulmonary fibrosis) (HCC)    Past Surgical History  Procedure Laterality Date  . Rotator cuff repair    . Breast biopsy    . Vertebroplasty      x 3  . Appendectomy    . Cataract extraction    . Knee arthroscopy  10/2007 and 10/2010    left  . Dilation and curettage of uterus      x several  . Replacement total knee  august 2012   Family History  Problem Relation Age of Onset  . Stroke  Father 7676  . Heart attack Father   . Coronary artery disease Father 1140  . Alcohol abuse Mother   . Breast cancer Sister   . Heart disease Sister   . Colon cancer Neg Hx    Social History  Substance Use Topics  . Smoking status: Former Smoker -- 1.00 packs/day for 20 years    Types: Cigarettes    Quit date: 01/25/1988  . Smokeless tobacco: Never Used  . Alcohol Use: 0.0 oz/week    0 Standard drinks or equivalent per week     Comment: 2 glasses every day   OB History    No data available     Review of Systems  Constitutional: Negative for fever and chills.  HENT: Negative for congestion and rhinorrhea.   Eyes: Negative for redness and visual disturbance.  Respiratory: Negative for shortness of breath and wheezing.   Cardiovascular: Negative for chest pain and palpitations.  Gastrointestinal: Negative for nausea and vomiting.  Genitourinary: Negative for dysuria and urgency.  Musculoskeletal: Negative for myalgias and arthralgias.  Skin: Positive for rash. Negative for pallor and wound.  Neurological: Negative for dizziness and headaches.      Allergies  Celecoxib; Clindamycin hcl; Rofecoxib; and Sertraline hcl  Home Medications   Prior to Admission medications  Medication Sig Start Date End Date Taking? Authorizing Provider  apixaban (ELIQUIS) 2.5 MG TABS tablet Take 1 tablet (2.5 mg total) by mouth 2 (two) times daily. 05/15/15   Azalee Course, PA  carvedilol (COREG) 6.25 MG tablet Take 1 tablet (6.25 mg total) by mouth 2 (two) times daily. 06/25/15   Jake Bathe, MD  desonide (DESOWEN) 0.05 % lotion Apply topically 2 (two) times daily. 06/27/15   Melene Plan, DO  ergocalciferol (VITAMIN D2) 50000 units capsule Take 1 capsule (50,000 Units total) by mouth once a week. 06/23/15   Nelwyn Salisbury, MD  escitalopram (LEXAPRO) 10 MG tablet Take 10 mg by mouth daily.    Historical Provider, MD  ferrous sulfate 324 (65 FE) MG TBEC Take 1 tablet (325 mg total) by mouth 2 (two) times  daily. 01/01/14   Nelwyn Salisbury, MD  furosemide (LASIX) 40 MG tablet TAKE 1 TABLET DAILY. 06/08/15   Nelwyn Salisbury, MD  ketoconazole (NIZORAL) 2 % cream Apply 1 application topically 2 (two) times daily as needed for irritation. 05/12/15   Nelwyn Salisbury, MD  levothyroxine (SYNTHROID, LEVOTHROID) 75 MCG tablet Take 75 mcg by mouth daily before breakfast.    Historical Provider, MD  metoprolol tartrate (LOPRESSOR) 25 MG tablet Take 25 mg by mouth daily. 05/15/15   Historical Provider, MD  pantoprazole (PROTONIX) 40 MG tablet Take 1 tablet (40 mg total) by mouth daily. 05/15/15   Azalee Course, PA   BP 116/70 mmHg  Pulse 99  Temp(Src) 97.7 F (36.5 C) (Oral)  Resp 18  Ht  (1.651 m)  Wt 147 lb (66.679 kg)  BMI 24.46 kg/m2  SpO2 97% Physical Exam  Constitutional: She is oriented to person, place, and time. She appears well-developed and well-nourished. No distress.  HENT:  Head: Normocephalic and atraumatic.  Eyes: EOM are normal. Pupils are equal, round, and reactive to light.  Neck: Normal range of motion. Neck supple.  Cardiovascular: Normal rate and regular rhythm.  Exam reveals no gallop and no friction rub.   No murmur heard. Pulmonary/Chest: Effort normal. She has no wheezes. She has no rales.  Abdominal: Soft. She exhibits no distension. There is no tenderness.  Musculoskeletal: She exhibits no edema or tenderness.  Neurological: She is alert and oriented to person, place, and time.  Skin: Skin is warm and dry. Rash (diffuse erythema with scaling of the face sparing the periorbital region.) noted. She is not diaphoretic.  Psychiatric: She has a normal mood and affect. Her behavior is normal.  Nursing note and vitals reviewed.   ED Course  Procedures (including critical care time) Labs Review Labs Reviewed - No data to display  Imaging Review No results found. I have personally reviewed and evaluated these images and lab results as part of my medical decision-making.   EKG  Interpretation None      MDM   Final diagnoses:  Rash  Dermatitis    82 yo F with a chief complaint of a rash. This is localized to her face and spares the perioral region. No other symptoms consistent with lupus. As this rash is very dry suspect this may be a reaction to one of her skin care products. We'll give her topical steroid. Have her follow-up with her dermatologist.  1:45 PM:  I have discussed the diagnosis/risks/treatment options with the patient and family and believe the pt to be eligible for discharge home to follow-up with Derm. We also discussed returning to the ED immediately  if new or worsening sx occur. We discussed the sx which are most concerning (e.g., sudden worsening pain, fever, inability to tolerate by mouth, rash to mucous membranes) that necessitate immediate return. Medications administered to the patient during their visit and any new prescriptions provided to the patient are listed below.  Medications given during this visit Medications - No data to display  Discharge Medication List as of 06/27/2015 12:59 PM    START taking these medications   Details  desonide (DESOWEN) 0.05 % lotion Apply topically 2 (two) times daily., Starting 06/27/2015, Until Discontinued, Print        The patient appears reasonably screen and/or stabilized for discharge and I doubt any other medical condition or other [facility] requiring further screening, evaluation, or treatment in the ED at this time prior to discharge.      Melene Plan, DO 06/27/15 1345

## 2015-06-30 NOTE — Telephone Encounter (Signed)
Noted. Please call the patient and see how she is doing today.

## 2015-07-01 NOTE — Telephone Encounter (Signed)
Called Trinette she said pt is doing well with therapy. She said she's on vacation and she will call us next week to update us on pt's health.

## 2015-07-27 ENCOUNTER — Telehealth: Payer: Self-pay | Admitting: Cardiology

## 2015-07-27 NOTE — Telephone Encounter (Signed)
Returned call to [employer]et Broughton no answer.LMTC.

## 2015-07-27 NOTE — Telephone Encounter (Signed)
New Message  Pt dtr calling to speak w/ RN about pt's blood thinner- has some excessive bleeding. Please call back and discuss.

## 2015-07-29 NOTE — Telephone Encounter (Signed)
pts dtr [name] calling back-pls call [phone]

## 2015-07-29 NOTE — Telephone Encounter (Signed)
Spoke with daughter who wanted to make us aware that pt has dermatitis that is causing some open sores that ooze blood from time to time.  She is concerned b/c pt is on Eliquis.  Pt has "been seen for years" for treatment of dermatitis.  Advised to f/u with that MD for further evaluation and necessary treatment.  Pt is on Eliquis 2.5 mg BID.  Also advised I will forward to Dr Anne FuSkains for his knowledge however d/t her increase risk of CVA with At Fib I doubt there will be a change in her medication.  Daughter states understanding.

## 2015-07-29 NOTE — Telephone Encounter (Signed)
No change in meds unless bleeding severe.  Thanks  Donato SchultzMark Sharica Roedel, MD

## 2015-07-29 NOTE — Telephone Encounter (Signed)
Daughter aware.

## 2015-07-31 ENCOUNTER — Ambulatory Visit (INDEPENDENT_AMBULATORY_CARE_PROVIDER_SITE_OTHER): Payer: Medicare Other | Admitting: Pulmonary Disease

## 2015-07-31 ENCOUNTER — Ambulatory Visit: Payer: Medicare Other

## 2015-07-31 ENCOUNTER — Encounter: Payer: Self-pay | Admitting: Pulmonary Disease

## 2015-07-31 VITALS — BP 122/64 | HR 86 | Ht 63.0 in | Wt 150.0 lb

## 2015-07-31 DIAGNOSIS — J84112 Idiopathic pulmonary fibrosis: Secondary | ICD-10-CM

## 2015-07-31 LAB — PULMONARY FUNCTION TEST
DL/VA % pred: 54 %
DL/VA: 2.53 ml/min/mmHg/L
DLCO COR % PRED: 24 %
DLCO COR: 5.73 ml/min/mmHg
DLCO UNC: 5.8 ml/min/mmHg
DLCO unc % pred: 25 %
FEF 25-75 POST: 1.39 L/s
FEF 25-75 Pre: 1.51 L/sec
FEF2575-%CHANGE-POST: -7 %
FEF2575-%PRED-POST: 117 %
FEF2575-%Pred-Pre: 127 %
FEV1-%CHANGE-POST: 2 %
FEV1-%PRED-POST: 62 %
FEV1-%Pred-Pre: 61 %
FEV1-POST: 1.08 L
FEV1-Pre: 1.06 L
FEV1FVC-%CHANGE-POST: 3 %
FEV1FVC-%PRED-PRE: 118 %
FEV6-%Change-Post: 0 %
FEV6-%Pred-Post: 54 %
FEV6-%Pred-Pre: 55 %
FEV6-Post: 1.21 L
FEV6-Pre: 1.22 L
FEV6FVC-%Pred-Post: 106 %
FEV6FVC-%Pred-Pre: 106 %
FVC-%Change-Post: 0 %
FVC-%PRED-PRE: 51 %
FVC-%Pred-Post: 51 %
FVC-POST: 1.21 L
FVC-PRE: 1.22 L
POST FEV1/FVC RATIO: 90 %
PRE FEV1/FVC RATIO: 87 %
PRE FEV6/FVC RATIO: 100 %
Post FEV6/FVC ratio: 100 %
RV % pred: 45 %
RV: 1.09 L
TLC % PRED: 47 %
TLC: 2.35 L

## 2015-07-31 NOTE — Assessment & Plan Note (Signed)
Today [name] [name] is pulmonary function testing shows progression of her disease with an FVC which is dropped by about 600 mL since the last visit. This is in keeping with the natural history of idiopathic pulmonary fibrosis. I explained this to her and her family today.  Today on exam it's clear that [name] [name]'s overall condition has progressed. Her dementia doesn't seem terribly worse than before but she's been immobile for the last 2 weeks secondary to knee pain and she's recently been diagnosed with chronic systolic heart failure.  So I still see very little role in subjecting her to nausea and vomiting from anti-fibrotic medications.  Plan: Continue expectant management, no role for anti-fibrotic therapy considering multiple comorbid illnesses which are progressing Use mucinex for chest congestion Exercise as much as tolerated Follow-up 6 months

## 2015-07-31 NOTE — Progress Notes (Signed)
Subjective:    Patient ID: [name] [name], female    DOB: 04-Feb-1930, 82 y.o.   MRN: [id number]  Synopsis: Referred in 2016 for IPF January 2016 chest x-ray> bilateral peripheral-based interstitial and nodular opacities, cardiomegaly 01/2014 ANA, RNP, SSA, SSB, Aldolase, CCP, ESR all negative January 2016 pulmonary function test> ratio 87%, FEV1 1.69 L (90% predicted, 10% change), total lung capacity 3.36 L (66% predicted), DLCO 7.98 (32% predicted) January 2016 high resolution CT chest> interlobular septal thickening, peripheral honeycombing, and variagated pattern of interstitial disease most specifically in the periphery and worse in the bases, overall picture consistent with usual interstitial pneumonitis. There is mild mediastinal lymphadenopathy favored to be reactive to the interstitial lung disease. Hiatal hernia, 3 vessel CAD. May 2016 ONO RA : less than 88% for 137 min December 2016 pulmonary function testing ratio 87%, FVC 1.84 L (77% predicted), DLCO 6.55 (28% predicted), TLC 3.94 (80% predicted)  July 2017 pulmonary function testing ratio 90%, FVC 1.21 L, 51% predicted, total lung capacity 2.35 L, 47% predicted, DLCO 5.80, 25% predicted  HPI Chief Complaint  Patient presents with  . Follow-up    review PFT.  Pt unable to perform 57m today.  Pt states she is doing well-family has not noticed any breathing distress.  Does c/o increased fatigue.       GMamtahasn't had any respiratory complaints though she does tire easily.  She has been using a wheelchair more frequently for the last two weeks in the setting of a lot of knee pain and generalized weakness.  She has not had any breathing complaints since the last visit but she really hasn't been able to push herself to a degree that cause her to get short of breath.  They have plans for a new injection for her knee pain and then they plan to have her exercise more.  She has been working with physical therapist about two times a week.     Past Medical History  Diagnosis Date  . Vertebral fracture   . Depression   . Hyperlipidemia   . Hypertension   . Osteoporosis   . OSA (obstructive sleep apnea)   . PMR (polymyalgia rheumatica) (HCC)   . Postherpetic neuralgia   . Diverticulosis of colon   . History of alcohol abuse   . Vitamin D deficiency   . Spinal stenosis   . Anemia   . Irritable bowel syndrome   . Anxiety   . Arthritis   . Thyroid disease   . IPF (idiopathic pulmonary fibrosis) ([facility]       Review of Systems  Constitutional: Negative for fever, chills and fatigue.  HENT: Negative for postnasal drip, rhinorrhea and sinus pressure.   Respiratory: Negative for cough, shortness of breath and stridor.   Cardiovascular: Negative for chest pain, palpitations and leg swelling.       Objective:   Physical Exam  Filed Vitals:   07/31/15 1106  BP: 122/64  Pulse: 86  Height: '5\' 3"'$  (1.6 m)  Weight: 150 lb (68.04 kg)  SpO2: 92%   RA  Gen: chronically ill appearing, no acute distress HEENT: NCAT, PERRL, EOMi, OP clear, neck supple without masses PULM: Crackles in bases to about halfway up bilaterally CV: Irregularly irregular but normal rate, no mgr, no JVD AB: BS+, soft, nontender Ext: warm, no edema, no clubbing, no cyanosis Derm: no rash or skin breakdown Neuro: A&Ox4, MAEW  Cardiology records reviewed from earlier this year where she was evaluated for A.  fib tachycardia related cardiomyopathy with an LVEF of 25-30%.     Assessment & Plan:   IPF (idiopathic pulmonary fibrosis) ([place]) Today Dayanis is pulmonary function testing shows progression of her disease with an FVC which is dropped by about 600 mL since the last visit. This is in keeping with the natural history of idiopathic pulmonary fibrosis. I explained this to her and her family today.  Today on exam it's clear that Ysabela's overall condition has progressed. Her dementia doesn't seem terribly worse than before but she's been  immobile for the last 2 weeks secondary to knee pain and she's recently been diagnosed with chronic systolic heart failure.  So I still see very little role in subjecting her to nausea and vomiting from anti-fibrotic medications.  Plan: Continue expectant management, no role for anti-fibrotic therapy considering multiple comorbid illnesses which are progressing Use mucinex for chest congestion Exercise as much as tolerated Follow-up 6 months   > 50% of todays 27 minute visit spent face to face  Updated Medication List Outpatient Encounter Prescriptions as of 07/31/2015  Medication Sig  . apixaban (ELIQUIS) 2.5 MG TABS tablet Take 1 tablet (2.5 mg total) by mouth 2 (two) times daily.  . carvedilol (COREG) 6.25 MG tablet Take 1 tablet (6.25 mg total) by mouth 2 (two) times daily.  . ergocalciferol (VITAMIN D2) 50000 units capsule Take 1 capsule (50,000 Units total) by mouth once a week.  . escitalopram (LEXAPRO) 10 MG tablet Take 10 mg by mouth daily.  . ferrous sulfate 324 (65 FE) MG TBEC Take 1 tablet (325 mg total) by mouth 2 (two) times daily.  . furosemide (LASIX) 40 MG tablet TAKE 1 TABLET DAILY.  Marland Kitchen levothyroxine (SYNTHROID, LEVOTHROID) 75 MCG tablet Take 75 mcg by mouth daily before breakfast.  . metoprolol tartrate (LOPRESSOR) 25 MG tablet Take 25 mg by mouth daily.  . pantoprazole (PROTONIX) 40 MG tablet Take 1 tablet (40 mg total) by mouth daily.  . [DISCONTINUED] desonide (DESOWEN) 0.05 % lotion Apply topically 2 (two) times daily. (Patient not taking: Reported on 07/31/2015)  . [DISCONTINUED] ketoconazole (NIZORAL) 2 % cream Apply 1 application topically 2 (two) times daily as needed for irritation. (Patient not taking: Reported on 07/31/2015)   No facility-administered encounter medications on file as of 07/31/2015.

## 2015-07-31 NOTE — Progress Notes (Signed)
PFT done today. 

## 2015-07-31 NOTE — Patient Instructions (Signed)
I would like for you to take Guaifenesin 600mg  extended release twice a day Exercise as much as possible We will see you back in 6 months

## 2015-08-03 ENCOUNTER — Telehealth: Payer: Self-pay | Admitting: Pulmonary Disease

## 2015-08-03 ENCOUNTER — Telehealth: Payer: Self-pay | Admitting: Family Medicine

## 2015-08-03 NOTE — Telephone Encounter (Signed)
Daughter states pt went to orthopedic dr this am and he advised pt ask dr fry to increase her lasix due to   swelling in her ankles.  Daughter cannot bring pt in, due to missing so much time from work already.  daughter aware dr fry is out, and states pt saw Raynelle FanningJulie a few weeks ago and hopes she can help approve the  furosemide (LASIX) 40 MG tablet  increase  [facility]

## 2015-08-03 NOTE — Telephone Encounter (Signed)
Patient will need evaluation or approval from her provider. I saw her one time in April for another reason.

## 2015-08-03 NOTE — Telephone Encounter (Signed)
Called spoke with pt's daughter. She states that her mother saw BQ on 07/31/15. She states that she has a few questions she did not feel like she could ask them in front of her parents. She would like to know her mother's life expectancy at this time with this diagnosis. She also is concerned with the progression of the disease over time. She requested a call back from BQ to discuss in detail at [phone]. I explained to her that I would send the message to BQ. She voiced understanding and had no further questions.   Will send message to BQ

## 2015-08-03 NOTE — Telephone Encounter (Signed)
Called and lmom to call back for the pts daughter.

## 2015-08-06 NOTE — Telephone Encounter (Signed)
BQ please advise if this message can be closed.  Thanks! 

## 2015-08-10 NOTE — Telephone Encounter (Signed)
Yes, I called and had to leave a message.  OK to close

## 2015-08-11 ENCOUNTER — Telehealth: Payer: Self-pay | Admitting: Emergency Medicine

## 2015-08-11 ENCOUNTER — Telehealth: Payer: Self-pay | Admitting: Pulmonary Disease

## 2015-08-11 NOTE — Telephone Encounter (Signed)
Error wrong dr.Stanley A Dalton ° ° °

## 2015-08-11 NOTE — Telephone Encounter (Signed)
Spoke with pt's daughter and advised that BQ will not be in office until Thursday/Friday. She said that was fine.   Dr Kendrick FriesMcQuaid - pt's daughter returning your call from last Friday. Thanks!

## 2015-08-12 ENCOUNTER — Emergency Department (HOSPITAL_COMMUNITY)
Admission: EM | Admit: 2015-08-12 | Discharge: 2015-08-12 | Disposition: A | Discharge status: Home / Self Care | Payer: Medicare Other | Attending: Emergency Medicine | Admitting: Emergency Medicine

## 2015-08-12 ENCOUNTER — Encounter (HOSPITAL_COMMUNITY): Payer: Self-pay | Admitting: Emergency Medicine

## 2015-08-12 ENCOUNTER — Telehealth: Payer: Self-pay | Admitting: Cardiology

## 2015-08-12 DIAGNOSIS — Z79899 Other long term (current) drug therapy: Secondary | ICD-10-CM | POA: Insufficient documentation

## 2015-08-12 DIAGNOSIS — R040 Epistaxis: Secondary | ICD-10-CM | POA: Diagnosis not present

## 2015-08-12 DIAGNOSIS — M1990 Unspecified osteoarthritis, unspecified site: Secondary | ICD-10-CM | POA: Insufficient documentation

## 2015-08-12 DIAGNOSIS — Z791 Long term (current) use of non-steroidal anti-inflammatories (NSAID): Secondary | ICD-10-CM | POA: Diagnosis not present

## 2015-08-12 DIAGNOSIS — I10 Essential (primary) hypertension: Secondary | ICD-10-CM | POA: Insufficient documentation

## 2015-08-12 DIAGNOSIS — Z87891 Personal history of nicotine dependence: Secondary | ICD-10-CM | POA: Insufficient documentation

## 2015-08-12 DIAGNOSIS — F329 Major depressive disorder, single episode, unspecified: Secondary | ICD-10-CM | POA: Insufficient documentation

## 2015-08-12 DIAGNOSIS — E785 Hyperlipidemia, unspecified: Secondary | ICD-10-CM | POA: Insufficient documentation

## 2015-08-12 MED ORDER — OXYMETAZOLINE HCL 0.05 % NA SOLN
1.0000 | Freq: Once | NASAL | Status: AC
  Administered 2015-08-12: 1 via NASAL
  Filled 2015-08-12: qty 15

## 2015-08-12 MED ORDER — SILVER NITRATE-POT NITRATE 75-25 % EX MISC
1.0000 | Freq: Once | CUTANEOUS | Status: AC
  Administered 2015-08-12: 1 via TOPICAL
  Filled 2015-08-12: qty 1

## 2015-08-12 NOTE — Discharge Instructions (Signed)
If your nosebleed recurs,soak some gauze with the afrin , place it in your nose and hold pressure for 15-20 minutes without stopping. If you are unable to stop the bleeding, return to the nearest ER. Return also immediately to the nearest ED if you develop chest pain, shortness of breath, dizziness or fainting, severe bleeding, or other concerns.   Nosebleed Nosebleeds are common. They are due to a crack in the inside lining of your nose (mucous membrane) or from a small blood vessel that starts to bleed. Nosebleeds can be caused by many conditions, such as injury, infections, dry mucous membranes or dry climate, medicines, nose picking, and home heating and cooling systems. Most nosebleeds come from blood vessels in the front of your nose. HOME CARE INSTRUCTIONS   Try controlling your nosebleed by pinching your nostrils gently and continuously for at least 10 minutes.  Avoid blowing or sniffing your nose for a number of hours after having a nosebleed.  Do not put gauze inside your nose yourself. If your nose was packed by your health care provider, try to maintain the pack inside of your nose until your health care provider removes it.  If a gauze pack was used and it starts to fall out, gently replace it or cut off the end of it.  If a balloon catheter was used to pack your nose, do not cut or remove it unless your health care provider has instructed you to do that.  Avoid lying down while you are having a nosebleed. Sit up and lean forward.  Use a nasal spray decongestant to help with a nosebleed as directed by your health care provider.  Do not use petroleum jelly or mineral oil in your nose. These can drip into your lungs.  Maintain humidity in your home by using less air conditioning or by using a humidifier.  Aspirinand blood thinners make bleeding more likely. If you are prescribed these medicines and you suffer from nosebleeds, ask your health care provider if you should stop  taking the medicines or adjust the dose. Do not stop medicines unless directed by your health care provider  Resume your normal activities as you are able, but avoid straining, lifting, or bending at the waist for several days.  If your nosebleed was caused by dry mucous membranes, use over-the-counter saline nasal spray or gel. This will keep the mucous membranes moist and allow them to heal. If you must use a lubricant, choose the water-soluble variety. Use it only sparingly, and do not use it within several hours of lying down.  Keep all follow-up visits as directed by your health care provider. This is important. SEEK MEDICAL CARE IF:  You have a fever.  You get frequent nosebleeds.  You are getting nosebleeds more often. SEEK IMMEDIATE MEDICAL CARE IF:  Your nosebleed lasts longer than 20 minutes.  Your nosebleed occurs after an injury to your face, and your nose looks crooked or broken.  You have unusual bleeding from other parts of your body.  You have unusual bruising on other parts of your body.  You feel light-headed or you faint.  You become sweaty.  You vomit blood.  Your nosebleed occurs after a head injury.   This information is not intended to replace advice given to you by your health care provider. Make sure you discuss any questions you have with your health care provider.   Document Released: 10/20/2004 Document Revised: 01/31/2014 Document Reviewed: 08/26/2013 Elsevier Interactive Patient Education [employer]2016 [employer] Inc.  Nasal Hygiene The nose has many positive effects on the air you breathe in that you may not be aware of. - Temperature regulation - Filtration and removal of particulate matter - Humidification - Defense against infections There are several things you can do to help keep your nose healthy. Foremost is nasal hygiene. This will help with your nose's natural function and keep it moist and healthy. Techniques to accomplish this are  outlined below. These will help with nasal dryness, nasal crusting, and nose bleeds. They also assist with clearing thick mucus that cause you to blow your nose frequently and may be associated with thick postnasal drip.  1. Use nasal saline daily. You can buy small bottles of this over-the-counter at the drug store or grocery store. Some brand names are: [address], [employer], [place]. Apply 2-3 sprays each nostril several times a day. If your nose feels dry or have had recent nasal surgery, try to use it every couple of hours. There is no medicine in it so it can be used as often as you like. Do NOT use the sprays containing decongestants. The appropriate way to apply nasal sprays: Place the nozzle just inside your nostril and point it towards the corner of your eye. Often, it is helpful to use the right-hand to spray into the left nasal cavity and use the left-hand to spray into the right side.  2. Use nasal saline irrigations and flushing.SinuCleanse, Simply Saline, [place]. Use this 1-2 times a day. We can also provide you with a recipe to use at home. Just ask [place]. To prevent reintroducing bacteria back into your nose, please keep your irrigation equipment clean and dry between uses. Throw away and replace reusable irrigation equipment every 3 weeks.   3. Use Vaseline petroleum jelly or Aquaphor. You can apply this gently to each nostril 2-3 times a day to promote moisturization for your nose. You may also use triple antibiotic ointment such as Neosporin or Bacitracin. These can all be bought over-the-counter.  4. Consider other nasal emollients. A few preparations are available over-thecounter. Ponaris, Nose Better, Pretz. Ask your pharmacist what is available. Also, some nasal saline sprays have additives such as aloe and these are helpful.  5. Consider using a humidifier at home. If your nose feels dry and/or you have frequent nose bleeds, you can buy a humidifier for your home. Be  cautious in using these if you have mold allergies.  6. Avoid excessive manual manipulation of your nose and nostrils. Frequent rubbing of your nostrils and the passing of tissues or fingers in your nostrils may aggravate nasal irritation from dryness and nose bleeds.

## 2015-08-12 NOTE — Telephone Encounter (Signed)
New Message  Pt dtr calling to speak w/ rN- pt had nose bleed today 7/19- stated that it may be due to her blood thinner. Please call back and discuss.

## 2015-08-12 NOTE — Telephone Encounter (Signed)
Spoke with pt's daughter, DPR on file. She states her father called her and told her pt has had a nose bleed most of the afternoon and has used an entire box of Kleenex. Daughter wanted to know if pt needed to hold Eliquis. Spoke with Dr. Mayford Knifeurner and she states pt needs to go to ER and they could instruct on holding Eliquis once nose bleed under control. Spoke with daughter again and advised her of this information. Daughter verbalized understanding and was in agreement with this plan and will take [facility].

## 2015-08-12 NOTE — ED Provider Notes (Signed)
CSN: [id number]     Arrival date & time 08/12/15  1739 History   First MD Initiated Contact with Patient 08/12/15 1807     No chief complaint on file.    (Consider location/radiation/quality/duration/timing/severity/associated sxs/prior Treatment) HPI Comments: [name] [name] is a(n) 82 y.o. female who presents  For nose bleed. She was recently diagnosed with Afib and is on eliqius. Her left nare began oozing blood today. It intermittently oozed all aftetnoon ( about 4 hours) and resolved before arrival. The patient tried blowing her nose. She did not hold pressure. She has never had a nose bleed. She denies blood in her throat, nausea, vomiting or light headedness.  Patient is a 82 y.o. female presenting with nosebleeds.  Epistaxis Location:  L nare Severity:  Mild Duration:  4 hours Timing:  Intermittent Progression:  Resolved Chronicity:  New Context: anticoagulants   Context: not aspirin use, not BiPAP, not bleeding disorder, not CPAP, not drug use, not elevation change, not foreign body, not home oxygen, not hypertension, not nose picking, not recent infection, not thrombocytopenia, not trauma and not weather change   Relieved by:  Applying pressure Worsened by:  Nothing tried Associated symptoms: no blood in oropharynx, no congestion, no cough, no dizziness, no facial pain, no fever, no headaches, no sinus pain, no sneezing, no sore throat and no syncope   Risk factors: no alcohol use, no allergies, no change in medication, no frequent nosebleeds, no head and neck surgery, no head and neck tumor, no intranasal steroids, no liver disease, no radiation treatment, no recent chemotherapy and no recent nasal surgery     Past Medical History  Diagnosis Date  . Vertebral fracture   . Depression   . Hyperlipidemia   . Hypertension   . Osteoporosis   . OSA (obstructive sleep apnea)   . PMR (polymyalgia rheumatica) (HCC)   . Postherpetic neuralgia   . Diverticulosis of colon   .  History of alcohol abuse   . Vitamin D deficiency   . Spinal stenosis   . Anemia   . Irritable bowel syndrome   . Anxiety   . Arthritis   . Thyroid disease   . IPF (idiopathic pulmonary fibrosis) (HCC)    Past Surgical History  Procedure Laterality Date  . Rotator cuff repair    . Breast biopsy    . Vertebroplasty      x 3  . Appendectomy    . Cataract extraction    . Knee arthroscopy  10/2007 and 10/2010    left  . Dilation and curettage of uterus      x several  . Replacement total knee  august 2012   Family History  Problem Relation Age of Onset  . Stroke Father 66  . Heart attack Father   . Coronary artery disease Father 83  . Alcohol abuse Mother   . Breast cancer Sister   . Heart disease Sister   . Colon cancer Neg Hx    Social History  Substance Use Topics  . Smoking status: Former Smoker -- 1.00 packs/day for 20 years    Types: Cigarettes    Quit date: 01/25/1988  . Smokeless tobacco: Never Used  . Alcohol Use: 0.0 oz/week    0 Standard drinks or equivalent per week     Comment: 2 glasses every day   OB History    No data available     Review of Systems  Constitutional: Negative for fever.  HENT: Positive for nosebleeds.  Negative for congestion, sneezing and sore throat.   Respiratory: Negative for cough.   Cardiovascular: Negative for syncope.  Neurological: Negative for dizziness and headaches.  Ten systems reviewed and are negative for acute change, except as noted in the HPI.      Allergies  Celecoxib; Clindamycin hcl; Rofecoxib; and Sertraline hcl  Home Medications   Prior to Admission medications   Medication Sig Start Date End Date Taking? Authorizing Provider  apixaban (ELIQUIS) 2.5 MG TABS tablet Take 1 tablet (2.5 mg total) by mouth 2 (two) times daily. 05/15/15  Yes Azalee Course, PA  carvedilol (COREG) 6.25 MG tablet Take 1 tablet (6.25 mg total) by mouth 2 (two) times daily. 06/25/15  Yes Jake Bathe, MD  ergocalciferol (VITAMIN D2)  50000 units capsule Take 1 capsule (50,000 Units total) by mouth once a week. Patient taking differently: Take 50,000 Units by mouth every Sunday.  06/23/15  Yes Nelwyn Salisbury, MD  escitalopram (LEXAPRO) 10 MG tablet Take 10 mg by mouth daily with breakfast.    Yes Historical Provider, MD  ferrous sulfate 324 (65 FE) MG TBEC Take 1 tablet (325 mg total) by mouth 2 (two) times daily. Patient taking differently: Take 1 tablet by mouth every evening.  01/01/14  Yes Nelwyn Salisbury, MD  furosemide (LASIX) 40 MG tablet TAKE 1 TABLET DAILY. Patient taking differently: Take  by mouth in the morning 06/08/15  Yes Nelwyn Salisbury, MD  levothyroxine (SYNTHROID, LEVOTHROID) 75 MCG tablet Take 75 mcg by mouth daily before breakfast.   Yes Historical Provider, MD  metoprolol tartrate (LOPRESSOR) 25 MG tablet Take 25 mg by mouth daily with breakfast.  05/15/15  Yes Historical Provider, MD  pantoprazole (PROTONIX) 40 MG tablet Take 1 tablet (40 mg total) by mouth daily. Patient taking differently: Take 40 mg by mouth daily with breakfast.  05/15/15  Yes Azalee Course, PA  PRESCRIPTION MEDICATION once a week. Gel injections in knee at dr. Lunette Stands - at [facility]   Yes Historical Provider, MD  traMADol (ULTRAM) 50 MG tablet Take 50 mg by mouth every 6 (six) hours as needed for moderate pain.   Yes Historical Provider, MD   BP 115/68 mmHg  Pulse 100  Temp(Src) 98.1 F (36.7 C)  Resp 16  Ht  (1.6 m)  Wt 65.772 kg  BMI 25.69 kg/m2  SpO2 92% Physical Exam  Constitutional: She is oriented to person, place, and time. She appears well-developed and well-nourished. No distress.  HENT:  Head: Normocephalic and atraumatic.  Nose:    Eyes: Conjunctivae are normal. No scleral icterus.  Neck: Normal range of motion.  Cardiovascular: Normal rate, regular rhythm and normal heart sounds.  Exam reveals no gallop and no friction rub.   No murmur heard. Pulmonary/Chest: Effort normal and breath sounds normal.  No respiratory distress.  Abdominal: Soft. Bowel sounds are normal. She exhibits no distension and no mass. There is no tenderness. There is no guarding.  Neurological: She is alert and oriented to person, place, and time.  Skin: Skin is warm and dry. She is not diaphoretic.    ED Course  .Epistaxis Management Date/Time: 08/12/2015 8:20 PM Performed by: Arthor Captain Authorized by: Arthor Captain Consent: Verbal consent obtained. Risks and benefits: risks, benefits and alternatives were discussed Consent given by: patient Patient identity confirmed: verbally with patient and provided demographic data Time out: Immediately prior to procedure a "time out" was called to verify the correct patient, procedure, equipment, support  staff and site/side marked as required. Preparation: Patient was prepped and draped in the usual sterile fashion. Local anesthetic: topical anesthetic Treatment site: left anterior Repair method: silver nitrate Post-procedure assessment: bleeding stopped Treatment complexity: simple Patient tolerance: Patient tolerated the procedure well with no immediate complications   (including critical care time) Labs Review Labs Reviewed - No data to display  Imaging Review No results found. I have personally reviewed and evaluated these images and lab results as part of my medical decision-making.   EKG Interpretation None      MDM   Final diagnoses:  Anterior epistaxis    Patient with epistaxis.  Resolved in the ED.  I was able to visualize the site of bleeding and cauterize the location with silver nitrate. Discussed home care, nasal hygiene and what to do of the bleeding returns. F/u with pcp/ ent.     Arthor Captainbigail Charlean Carneal, PA-C 08/12/15 2139   Cathren LaineKevin Steinl, MD 08/18/15 1255

## 2015-08-12 NOTE — ED Notes (Addendum)
Patient presents for intermittent epistaxis x2-3 hours. Denies excessive blowing of nose or getting hot before hand. Bleeding controlled at this time. Denies other c/c. Eliquis therapy. A&O x4.

## 2015-08-13 ENCOUNTER — Telehealth: Payer: Self-pay | Admitting: Family Medicine

## 2015-08-13 NOTE — Telephone Encounter (Signed)
Per Dr. Clent RidgesFry keep pressure on nose, see us tomorrow, we might need to do a referral. Pt stated that bleed has slowed down. We will get pt on the schedule.

## 2015-08-13 NOTE — Telephone Encounter (Signed)
Patient Name: Eugenia PancoastGLORIA [name] DOB: 04/29/1930 Initial Comment Wife is having nose bleeds-- Nurse Assessment Nurse: Charna Elizabethrumbull, RN, Cathy Date/Time (Eastern Time): 08/13/2015 11:16:39 AM Confirm and document reason for call. If symptomatic, describe symptoms. You must click the next button to save text entered. ---[name] BondsGloria states she developed another nosebleed today (they started yesterday). No injury in the past 3 days. No breathing difficulty. Alert and responsive. Has the patient traveled out of the country within the last 30 days? ---No Does the patient have any new or worsening symptoms? ---Yes Will a triage be completed? ---Yes Related visit to physician within the last 2 weeks? ---No Does the PT have any chronic conditions? (i.e. diabetes, asthma, etc.) ---Yes List chronic conditions. ---A-Fib Is this a behavioral health or substance abuse call? ---No Guidelines Guideline Title Affirmed Question Affirmed Notes Nosebleed [1] Bleeding now AND [2] second call after being instructed in correct technique of direct pressure Final Disposition User Go to ED Now Charna Elizabethrumbull, RN, Lynden Angathy Referrals GO TO FACILITY UNDECIDED Disagree/Comply: Comply

## 2015-08-14 ENCOUNTER — Encounter: Payer: Self-pay | Admitting: Family Medicine

## 2015-08-14 ENCOUNTER — Ambulatory Visit (INDEPENDENT_AMBULATORY_CARE_PROVIDER_SITE_OTHER): Payer: Medicare Other | Admitting: Family Medicine

## 2015-08-14 VITALS — BP 90/41 | HR 86 | Temp 98.1°F

## 2015-08-14 DIAGNOSIS — R040 Epistaxis: Secondary | ICD-10-CM

## 2015-08-14 NOTE — Telephone Encounter (Signed)
Pt will see the doctor

## 2015-08-14 NOTE — Progress Notes (Signed)
   Subjective:    Patient ID: [name] [name], female    DOB: 04/12/1930, 82 y.o.   MRN: [id number]  HPI Here with her daughter for left sided nosebleeds that started 3 days ago. No recent trauma but she was recently started on Eliquis for atrial fibrillation. She went to the ER that night and was treated with silver nitrate, however the bleeding started again several hours later. It has bled off and on ever since.    Review of Systems  Constitutional: Negative.   HENT: Positive for nosebleeds. Negative for congestion, sinus pressure and sore throat.   Eyes: Negative.   Respiratory: Negative.   Cardiovascular: Negative.        Objective:   Physical Exam  Constitutional: She appears well-developed and well-nourished.  In a wheelchair  HENT:  Head: Normocephalic and atraumatic.  Right Ear: External ear normal.  Left Ear: External ear normal.  Mouth/Throat: Oropharynx is clear and moist.  the left naris has several small clots and is oozing blood  Eyes: Conjunctivae and EOM are normal. Pupils are equal, round, and reactive to light.  Neck: Neck supple. No thyromegaly present.  Lymphadenopathy:    She has no cervical adenopathy.          Assessment & Plan:  Nosebleeds, probably a side effect of the Eliquis. [facility].  Nelwyn SalisburyFRY,STEPHEN A, MD

## 2015-08-14 NOTE — Progress Notes (Signed)
Pre visit review using our clinic review tool, if applicable. No additional management support is needed unless otherwise documented below in the visit note. Pt unable to weigh 

## 2015-08-14 NOTE — Telephone Encounter (Signed)
Will forward to BQ

## 2015-08-17 NOTE — Telephone Encounter (Signed)
Called daughter 08/17/2015, questions answered

## 2015-08-30 ENCOUNTER — Other Ambulatory Visit: Payer: Self-pay | Admitting: Family Medicine

## 2015-10-11 ENCOUNTER — Other Ambulatory Visit: Payer: Self-pay | Admitting: Family Medicine

## 2015-10-12 NOTE — Telephone Encounter (Signed)
Can we refill this? 

## 2015-10-22 ENCOUNTER — Telehealth: Payer: Self-pay | Admitting: Cardiology

## 2015-10-22 NOTE — Telephone Encounter (Signed)
New message    Pt daughter is calling to see if pt can stop or decrease some of the diretic medication because she is having to use the bathroom often and its hard for he to get up pt uses depends and goes 3 times or more in an hour

## 2015-10-22 NOTE — Telephone Encounter (Signed)
Pt's daughter called to asked Dr. Anne FuSkains if pt can stop taking the Lasix 40 mg daily. Daughter states that pt is incontinent, has knee problems, and also has dementia. It is hard for pt to move around. She does not know when she needs  to go. Daughter was made aware that pt has Chronic Systolic heart failure and the EF is 25 to 30 %. Daughter said " I didn't know. I thought it was for her BP" Daughter wants  to see what Dr. Jacklyn ShellSkins recommends.

## 2015-10-23 ENCOUNTER — Ambulatory Visit (INDEPENDENT_AMBULATORY_CARE_PROVIDER_SITE_OTHER): Payer: Medicare Other | Admitting: Family Medicine

## 2015-10-23 ENCOUNTER — Encounter: Payer: Self-pay | Admitting: Family Medicine

## 2015-10-23 VITALS — BP 96/70 | HR 84 | Temp 98.1°F | Ht 63.0 in | Wt 129.0 lb

## 2015-10-23 DIAGNOSIS — I10 Essential (primary) hypertension: Secondary | ICD-10-CM | POA: Diagnosis not present

## 2015-10-23 DIAGNOSIS — I480 Paroxysmal atrial fibrillation: Secondary | ICD-10-CM

## 2015-10-23 DIAGNOSIS — F32A Depression, unspecified: Secondary | ICD-10-CM

## 2015-10-23 DIAGNOSIS — R413 Other amnesia: Secondary | ICD-10-CM | POA: Diagnosis not present

## 2015-10-23 DIAGNOSIS — R600 Localized edema: Secondary | ICD-10-CM

## 2015-10-23 DIAGNOSIS — F329 Major depressive disorder, single episode, unspecified: Secondary | ICD-10-CM

## 2015-10-23 DIAGNOSIS — Z23 Encounter for immunization: Secondary | ICD-10-CM | POA: Diagnosis not present

## 2015-10-23 DIAGNOSIS — R32 Unspecified urinary incontinence: Secondary | ICD-10-CM

## 2015-10-23 MED ORDER — ESCITALOPRAM OXALATE 20 MG PO TABS: 20.0000 mg | ORAL_TABLET | Freq: Every day | ORAL | 11 refills | Status: DC

## 2015-10-23 NOTE — Progress Notes (Signed)
Pre visit review using our clinic review tool, if applicable. No additional management support is needed unless otherwise documented below in the visit note. 

## 2015-10-23 NOTE — Telephone Encounter (Signed)
She can try to stop Lasix however I do recommend that she weigh herself daily. If weight increases 3 pounds, take Lasix.  Donato SchultzMark Emet Rafanan, MD

## 2015-10-23 NOTE — Progress Notes (Signed)
   Subjective:    Patient ID: [name] [name], female    DOB: 07/16/1930, 82 y.o.   MRN: [id number]  HPI Here with her husband and daughter to follow up. She has been working with PT and OT at [facility] for help with walking, and this has now stopped. She has trouble walking due to knee pain among other things, and the family thinks she has lost some confidence as well. She chronic pain from severe arthritis in the left knee and she recently received another steroid injection in the knee per Dr. Charlett BlakeVoytek. She has a walker but has been relying on a wheelchair to get to the dining room, etc every day. She has developed some urinary incontinence as well and they think her diuretic is contributing to this. She denies any SOB and she has only trace ankle swelling. Her BP has been stable. She is currently taking 40 mg of Lasix every day. The family also says she seems more lethargic than usual and does not laugh and enjoy her self as much as usual. She has been on Lexapro 10 mg a day for about a year now.   Review of Systems  Constitutional: Negative.   Respiratory: Negative.   Cardiovascular: Negative.   Gastrointestinal: Negative.   Neurological: Negative.   Psychiatric/Behavioral: Positive for dysphoric mood. Negative for agitation, behavioral problems, confusion, decreased concentration, hallucinations, self-injury, sleep disturbance and suicidal ideas. The patient is not nervous/anxious.        Objective:   Physical Exam  Constitutional: She is oriented to person, place, and time. She appears well-developed and well-nourished.  Alert, in a wheelchair  Cardiovascular: Normal rate, regular rhythm, normal heart sounds and intact distal pulses.   Pulmonary/Chest: Effort normal and breath sounds normal. No respiratory distress. She has no wheezes. She has no rales.  Musculoskeletal:  Trace edema in both ankles   Neurological: She is alert and oriented to person, place, and time.  Psychiatric: She  has a normal mood and affect. Her behavior is normal. Judgment and thought content normal.          Assessment & Plan:  Her HTN is stable and her leg edema is controlled. We will decrease the Lasix to 20 mg daily (she will take 1/2 of a tablet), and hopefully this will help the incontinence situation. She seems to be more depressed lately so we will increase the Lexapro to 20 mg daily. Recheck in 2 weeks. Nelwyn SalisburyFRY,Daric Koren A, MD

## 2015-10-23 NOTE — Telephone Encounter (Signed)
Left message for daughter on VM of Dr Anne FuSkains orders and to call back with any questions or concerns.

## 2015-10-23 NOTE — Telephone Encounter (Signed)
Left message to c/b.

## 2015-11-02 ENCOUNTER — Telehealth: Payer: Self-pay | Admitting: Family Medicine

## 2015-11-02 NOTE — Telephone Encounter (Signed)
I agree that the risk of blood thinners in the patient may now outweigh the benefits. Stop the Eliquis and let us see her again in one month

## 2015-11-02 NOTE — Telephone Encounter (Signed)
Patient Name: [name] PancoastGLORIA [name] DOB: 10/26/1930 Initial Comment Caller says, dtr wants to get mother off her blood thinners, and get an assessment of how she is doing. She gets large bruises everytime she bumps into anything, and she has a skin sores which she continues to pick, that cause sores. Nurse Assessment Nurse: Yetta BarreJones, RN, Miranda Date/Time (Eastern Time): 11/02/2015 11:08:55 AM Confirm and document reason for call. If symptomatic, describe symptoms. You must click the next button to save text entered. ---Caller states her mother has dermatitis and dementia. She picks at places on her skin and they open to the point they bleed. Also she is bruising easily. They want to stop the blood thinner. She also want to find out from the doctor what his assessment of her overall health. Has the patient traveled out of the country within the last 30 days? ---Not Applicable Does the patient have any new or worsening symptoms? ---Yes Will a triage be completed? ---Yes Related visit to physician within the last 2 weeks? ---Yes Does the PT have any chronic conditions? (i.e. diabetes, asthma, etc.) ---Yes List chronic conditions. ---Dementia, Depression, A-fib, HTN, Thyroid, CHF Is this a behavioral health or substance abuse call? ---No Guidelines Guideline Title Affirmed Question Affirmed Notes Confusion - Delirium [1] Longstanding confusion (e.g., dementia, stroke) AND [2] NO worsening or change Bruises Taking Coumadin (warfarin) or other strong blood thinner, or known bleeding disorder (e.g., thrombocytopenia) Final Disposition User See Physician within 24 Hours Yetta BarreJones, RN, Miranda Comments Dr. Clent RidgesFry, Caller would like to speak to you regarding the patient's overall condition. She wants your in put on stopping some medications and care to help provider her comfort. I have recommended she contact Cardiology about the blood thinner. Referrals GO TO FACILITY UNDECIDED Disagree/Comply:  Comply

## 2015-11-02 NOTE — Telephone Encounter (Signed)
Please read below...

## 2015-11-02 NOTE — Telephone Encounter (Signed)
I spoke with daughter [name] and went over below information, also Dr. Clent RidgesFry does recommend a Aspirin 81 mg take 1 each day. Pt will follow up with us in about one month.

## 2015-11-05 ENCOUNTER — Encounter (HOSPITAL_COMMUNITY): Payer: Self-pay | Admitting: Emergency Medicine

## 2015-11-05 ENCOUNTER — Emergency Department (HOSPITAL_COMMUNITY)
Admission: EM | Admit: 2015-11-05 | Discharge: 2015-11-05 | Disposition: A | Discharge status: Home / Self Care | Payer: Medicare Other | Attending: Emergency Medicine | Admitting: Emergency Medicine

## 2015-11-05 DIAGNOSIS — D696 Thrombocytopenia, unspecified: Secondary | ICD-10-CM | POA: Insufficient documentation

## 2015-11-05 DIAGNOSIS — E039 Hypothyroidism, unspecified: Secondary | ICD-10-CM | POA: Insufficient documentation

## 2015-11-05 DIAGNOSIS — I10 Essential (primary) hypertension: Secondary | ICD-10-CM | POA: Insufficient documentation

## 2015-11-05 DIAGNOSIS — Z87891 Personal history of nicotine dependence: Secondary | ICD-10-CM | POA: Diagnosis not present

## 2015-11-05 DIAGNOSIS — K068 Other specified disorders of gingiva and edentulous alveolar ridge: Secondary | ICD-10-CM

## 2015-11-05 DIAGNOSIS — Z79899 Other long term (current) drug therapy: Secondary | ICD-10-CM | POA: Diagnosis not present

## 2015-11-05 DIAGNOSIS — Z7982 Long term (current) use of aspirin: Secondary | ICD-10-CM | POA: Diagnosis not present

## 2015-11-05 DIAGNOSIS — K1379 Other lesions of oral mucosa: Secondary | ICD-10-CM

## 2015-11-05 HISTORY — DX: Unspecified dementia, unspecified severity, without behavioral disturbance, psychotic disturbance, mood disturbance, and anxiety: F03.90

## 2015-11-05 LAB — CBC WITH DIFFERENTIAL/PLATELET
BASOS ABS: 0.1 10*3/uL (ref 0.0–0.1)
Basophils Relative: 1 %
EOS ABS: 0.2 10*3/uL (ref 0.0–0.7)
Eosinophils Relative: 3 %
HCT: 45.2 % (ref 36.0–46.0)
Hemoglobin: 14.7 g/dL (ref 12.0–15.0)
LYMPHS PCT: 41 %
Lymphs Abs: 2.2 10*3/uL (ref 0.7–4.0)
MCH: 36.8 pg — ABNORMAL HIGH (ref 26.0–34.0)
MCHC: 32.5 g/dL (ref 30.0–36.0)
MCV: 113.3 fL — AB (ref 78.0–100.0)
MONO ABS: 0.7 10*3/uL (ref 0.1–1.0)
Monocytes Relative: 13 %
NEUTROS PCT: 42 %
Neutro Abs: 2.1 10*3/uL (ref 1.7–7.7)
PLATELETS: 125 10*3/uL — AB (ref 150–400)
RBC: 3.99 MIL/uL (ref 3.87–5.11)
RDW: 19.6 % — AB (ref 11.5–15.5)
WBC: 5.3 10*3/uL (ref 4.0–10.5)

## 2015-11-05 LAB — PROTIME-INR
INR: 1.43
PROTHROMBIN TIME: 17.6 s — AB (ref 11.4–15.2)

## 2015-11-05 NOTE — ED Notes (Signed)
MD at bedside. 

## 2015-11-05 NOTE — ED Notes (Addendum)
Discharge instructions and follow up care reviewed with patient's daughter. Patient's daughter verbalized understanding.

## 2015-11-05 NOTE — ED Provider Notes (Addendum)
[facility]-EMERGENCY DEPT Provider Note   CSN: [id number] Arrival date & time: 11/05/15  1011     History   Chief Complaint Chief Complaint  Patient presents with  . Oral Bleed    HPI [name] [name] is a 82 y.o. female.  HPI 82 year old female with past medical history as below who presents with black blood in her mouth upon awakening this morning. Patient was in her usual state of health yesterday. She slept well throughout the night according her report. When she woke up this morning, she was noted to have black, dried blood throughout her entire oropharynx. She denies any trauma. Denies any lightheadedness, chest pain, or shortness of breath. She does have a history of nosebleeds but denies any recent nosebleeding. She states she may have bit her mouth during her sleep and has mild right mouth pain that is sharp and stabbing. No other medical complaints. Of note, she was recently on Eliquis but stopped this several days ago and recently started a baby aspirin.  Past Medical History:  Diagnosis Date  . Anemia   . Anxiety   . Arthritis   . Dementia   . Depression   . Diverticulosis of colon   . History of alcohol abuse   . Hyperlipidemia   . Hypertension   . IPF (idiopathic pulmonary fibrosis) (HCC)   . Irritable bowel syndrome   . OSA (obstructive sleep apnea)   . Osteoporosis   . PMR (polymyalgia rheumatica) (HCC)   . Postherpetic neuralgia   . Spinal stenosis   . Thyroid disease   . Vertebral fracture   . Vitamin D deficiency     Patient Active Problem List   Diagnosis Date Noted  . Bilateral leg edema 10/23/2015  . Urinary incontinence 10/23/2015  . PAF (paroxysmal atrial fibrillation) (HCC) 05/27/2015  . Fatigue 06/03/2014  . Hypothyroidism 04/02/2014  . Absolute anemia 01/01/2014  . IPF (idiopathic pulmonary fibrosis) (HCC) 01/01/2014  . OA (osteoarthritis) of knee 07/02/2010  . Memory loss 07/02/2010  . Hyperlipidemia 03/08/2010  . Vitamin D deficiency  10/05/2009  . Obstructive sleep apnea 10/08/2007  . SYNDROME, CHRONIC PAIN 11/16/2006  . DIVERTICULOSIS, COLON 06/06/2006  . Depression 04/30/2006  . Essential hypertension 04/30/2006  . OSTEOPOROSIS 04/30/2006    Past Surgical History:  Procedure Laterality Date  . APPENDECTOMY    . BREAST BIOPSY    . CATARACT EXTRACTION    . DILATION AND CURETTAGE OF UTERUS     x several  . KNEE ARTHROSCOPY  10/2007 and 10/2010   left  . REPLACEMENT TOTAL KNEE  august 2012  . ROTATOR CUFF REPAIR    . VERTEBROPLASTY     x 3    OB History    No data available       Home Medications    Prior to Admission medications   Medication Sig Start Date End Date Taking? Authorizing Provider  aspirin EC 81 MG tablet Take 81 mg by mouth daily.   Yes Historical Provider, MD  carvedilol (COREG) 6.25 MG tablet Take 1 tablet (6.25 mg total) by mouth 2 (two) times daily. 06/25/15  Yes Jake Bathe, MD  escitalopram (LEXAPRO) 20 MG tablet Take 20 mg by mouth daily.   Yes Historical Provider, MD  ferrous sulfate 324 (65 FE) MG TBEC Take 1 tablet (325 mg total) by mouth 2 (two) times daily. Patient taking differently: Take 1 tablet by mouth at bedtime.  01/01/14  Yes Nelwyn Salisbury, MD  levothyroxine (SYNTHROID, LEVOTHROID)  75 MCG tablet Take 75 mcg by mouth daily before breakfast.   Yes Historical Provider, MD  metoprolol tartrate (LOPRESSOR) 25 MG tablet Take 25 mg by mouth daily with breakfast.  05/15/15  Yes Historical Provider, MD  pantoprazole (PROTONIX) 40 MG tablet Take 1 tablet (40 mg total) by mouth daily. Patient taking differently: Take 40 mg by mouth daily with breakfast.  05/15/15  Yes Azalee CourseHao Meng, PA  traMADol (ULTRAM) 50 MG tablet Take 50 mg by mouth every 6 (six) hours as needed for moderate pain.   Yes Historical Provider, MD  Vitamin D, Ergocalciferol, (DRISDOL) 50000 units CAPS capsule TAKE 1 CAPSULE ONCE A WEEK. 10/13/15  Yes Nelwyn SalisburyStephen A Fry, MD  apixaban (ELIQUIS) 2.5 MG TABS tablet Take 1 tablet  (2.5 mg total) by mouth 2 (two) times daily. Patient not taking: Reported on 11/05/2015 05/15/15   Azalee CourseHao Meng, PA  furosemide (LASIX) 40 MG tablet TAKE 1 TABLET DAILY. Patient not taking: Reported on 11/05/2015 06/08/15   Nelwyn SalisburyStephen A Fry, MD    Family History Family History  Problem Relation Age of Onset  . Stroke Father 2076  . Heart attack Father   . Coronary artery disease Father 6440  . Alcohol abuse Mother   . Breast cancer Sister   . Heart disease Sister   . Colon cancer Neg Hx     Social History Social History  Substance Use Topics  . Smoking status: Former Smoker    Packs/day: 1.00    Years: 20.00    Types: Cigarettes    Quit date: 01/25/1988  . Smokeless tobacco: Never Used  . Alcohol use 0.0 oz/week     Comment: 2 glasses every day     Allergies   Celecoxib; Clindamycin hcl; Rofecoxib; and Sertraline hcl   Review of Systems Review of Systems  Constitutional: Negative for chills and fever.  HENT: Positive for nosebleeds. Negative for congestion, rhinorrhea, sore throat, trouble swallowing and voice change.   Eyes: Negative for visual disturbance.  Respiratory: Negative for cough, shortness of breath and wheezing.   Cardiovascular: Negative for chest pain and leg swelling.  Gastrointestinal: Negative for abdominal pain, diarrhea, nausea and vomiting.  Genitourinary: Negative for dysuria, flank pain, vaginal bleeding and vaginal discharge.  Musculoskeletal: Negative for neck pain.  Skin: Negative for rash.  Allergic/Immunologic: Negative for immunocompromised state.  Neurological: Negative for syncope and headaches.  Hematological: Does not bruise/bleed easily.  All other systems reviewed and are negative.    Physical Exam Updated Vital Signs BP 126/88 (BP Location: Right Arm)   Pulse 61   Temp 97.5 F (36.4 C) (Oral)   Resp 16   Ht 5\' 3"  (1.6 m)   Wt 129 lb (58.5 kg)   SpO2 97%   BMI 22.85 kg/m   Physical Exam  Constitutional: She is oriented to person,  place, and time. She appears well-developed and well-nourished. No distress.  HENT:  Head: Normocephalic and atraumatic.  Superficial bite mark to right cheek. Small amount of dried blood surrounding abrasion. No deep lacerations. No active bleeding. Dried blood throughout the oropharynx. Nasopharynx thoroughly inspected with no evidence of epistaxis or bleeding. No clots in the nares bilaterally. Poor dentition.  Eyes: Conjunctivae are normal.  Neck: Neck supple.  Cardiovascular: Normal rate, regular rhythm and normal heart sounds.  Exam reveals no friction rub.   No murmur heard. Pulmonary/Chest: Effort normal and breath sounds normal. No respiratory distress. She has no wheezes. She has no rales.  Abdominal: Soft. Bowel sounds  are normal. She exhibits no distension. There is no tenderness. There is no guarding.  Musculoskeletal: She exhibits no edema.  Neurological: She is alert and oriented to person, place, and time. She exhibits normal muscle tone.  Skin: Skin is warm. Capillary refill takes less than 2 seconds.  Psychiatric: She has a normal mood and affect.  Nursing note and vitals reviewed.    ED Treatments / Results  Labs (all labs ordered are listed, but only abnormal results are displayed) Labs Reviewed  CBC WITH DIFFERENTIAL/PLATELET - Abnormal; Notable for the following:       Result Value   MCV 113.3 (*)    MCH 36.8 (*)    RDW 19.6 (*)    Platelets 125 (*)    All other components within normal limits  PROTIME-INR - Abnormal; Notable for the following:    Prothrombin Time 17.6 (*)    All other components within normal limits    EKG  EKG Interpretation None       Radiology No results found.  Procedures Procedures (including critical care time)  Medications Ordered in ED Medications - No data to display   Initial Impression / Assessment and Plan / ED Course  I have reviewed the triage vital signs and the nursing notes.  Pertinent labs & imaging  results that were available during my care of the patient were reviewed by me and considered in my medical decision making (see chart for details).  Clinical Course    82 year old female who presents with black, dried blood in the oropharynx. On exam, patient has abrasion/superficial laceration to right gingival surface with adherent clot. She has poor dentition and admit she may have bit her cheek in her sleep. She has no abdominal pain, no history of peptic ulcer disease, no NSAID or blood thinner use, and I do not suspect upper GI bleed. She is otherwise very well-appearing. CBC shows stable hemoglobin. No evidence of recurrent epistaxis. She has mild, slightly decreased from baseline thrombocytopenia but this is unlikely to cause her bleed. No other evidence of gingival bleed or petechiae to suggest platelet dysfunction. She is hemodynamically stable and tolerating by mouth in the ED. INR is normal. No cough or signs of hemoptysis or pulmonary etiology. Will discharge with strict return precautions and outpatient follow-up.  Final Clinical Impressions(s) / ED Diagnoses   Final diagnoses:  Gingival bleeding  Mouth sore  Thrombocytopenia [facility])    New Prescriptions Discharge Medication List as of 11/05/2015 11:34 AM       Shaune Pollack, MD 11/05/15 1732    Shaune Pollack, MD 11/05/15 1733

## 2015-11-05 NOTE — ED Triage Notes (Addendum)
Per EMS, patient has dried blood in her mouth. Patient was recently taken off Eliquis. Patient is alert and oriented x4 per EMS. Hx of dementia. [facility]

## 2015-11-10 ENCOUNTER — Encounter: Payer: Self-pay | Admitting: Family Medicine

## 2015-11-10 ENCOUNTER — Ambulatory Visit (INDEPENDENT_AMBULATORY_CARE_PROVIDER_SITE_OTHER): Payer: Medicare Other | Admitting: Family Medicine

## 2015-11-10 VITALS — BP 96/69 | HR 89 | Temp 97.0°F | Ht 63.0 in | Wt 141.0 lb

## 2015-11-10 DIAGNOSIS — I480 Paroxysmal atrial fibrillation: Secondary | ICD-10-CM | POA: Diagnosis not present

## 2015-11-10 DIAGNOSIS — R238 Other skin changes: Secondary | ICD-10-CM

## 2015-11-10 DIAGNOSIS — R600 Localized edema: Secondary | ICD-10-CM | POA: Diagnosis not present

## 2015-11-10 DIAGNOSIS — F339 Major depressive disorder, recurrent, unspecified: Secondary | ICD-10-CM

## 2015-11-10 DIAGNOSIS — R233 Spontaneous ecchymoses: Secondary | ICD-10-CM | POA: Insufficient documentation

## 2015-11-10 DIAGNOSIS — I10 Essential (primary) hypertension: Secondary | ICD-10-CM

## 2015-11-10 DIAGNOSIS — R32 Unspecified urinary incontinence: Secondary | ICD-10-CM

## 2015-11-10 MED ORDER — CLOBETASOL PROPIONATE 0.05 % EX OINT: 1.0000 "application " | TOPICAL_OINTMENT | Freq: Two times a day (BID) | CUTANEOUS | 5 refills | Status: DC

## 2015-11-10 MED ORDER — ASPIRIN EC 81 MG PO TBEC: DELAYED_RELEASE_TABLET | ORAL | 0 refills | Status: DC

## 2015-11-10 NOTE — Progress Notes (Signed)
   Subjective:    Patient ID: [name] [name], female    DOB: 01/17/1931, 82 y.o.   MRN: [id number]  HPI Here with husband and daughter for multiple issues. First she was in the ER on 11-05-15 for waking up 2 mornings with black blood in the mouth. It turns out she had bitten the inside of her right cheek in her sleep. Other wise she appeared to be stable. A CBC showed a Hgb of 14.7 and a platelet count of 125. She was sent home and has had no further mouth bleeding since then. She does have extensive bruising over the arms and legs. She stopped taking Eliquis 8 days ago and now takes ASA 81 mg daily. Also she has been taking 1/2 of a tablet of Lasix daily to help with feet swelling but this still causes her to be incontinent of urine. No SOB. She has been on the higher dose of Lexapro (now at 20 mg daily) for several weeks but there has been little change in her behavior. Finally she has a long hx of dermatitis that causes areas of intense inflammation and itching, and then she often scratches these areas and gets them infected. She used to see Dr. Yetta BarreJones for dermatology care and he would prescribe Clobetasol and Mupiricin for her. She now has patches of this on the face and back and left leg.    Review of Systems  Constitutional: Positive for fatigue. Negative for appetite change, fever and unexpected weight change.  Respiratory: Negative.   Cardiovascular: Positive for leg swelling. Negative for chest pain and palpitations.  Skin: Positive for rash.  Hematological: Bruises/bleeds easily.       Objective:   Physical Exam  Constitutional:  In her wheelchair, alert   Neck: No thyromegaly present.  Cardiovascular: Normal rate, regular rhythm, normal heart sounds and intact distal pulses.   Pulmonary/Chest: Effort normal and breath sounds normal.  Abdominal: Soft. Bowel sounds are normal. She exhibits no distension and no mass. There is no tenderness. There is no rebound and no guarding.    Musculoskeletal:  2+ edema in both feet and ankles   Lymphadenopathy:    She has no cervical adenopathy.  Skin:  She has patches of red inflamed skin on the left temple, middle back, and lower left leg. These are excoriated and have dried blood on them.           Assessment & Plan:  For the ankle edema we will start her wearing knee high compression stockings every day. We will stop the Lasix, and hopefully this will improve the urine incontinence problem. For the dermatitis she will apply Clobetasol ointment BID. For the bruising she will decrease the baby ASA to taking it every other day.  Nelwyn SalisburyFRY,Makenzy Krist A, MD

## 2015-11-10 NOTE — Progress Notes (Signed)
Pre visit review using our clinic review tool, if applicable. No additional management support is needed unless otherwise documented below in the visit note. 

## 2015-11-16 ENCOUNTER — Telehealth: Payer: Self-pay | Admitting: Family Medicine

## 2015-11-16 DIAGNOSIS — R413 Other amnesia: Secondary | ICD-10-CM

## 2015-11-16 NOTE — Telephone Encounter (Signed)
Daughter states pt's dementia has gotten worse and would like a referral [facility].  It has been over 3 yrs since she was seen there and pt needs a referral.

## 2015-11-17 NOTE — Telephone Encounter (Signed)
I spoke with daughter and gave below information.

## 2015-11-17 NOTE — Telephone Encounter (Signed)
The referral was done  

## 2015-11-20 ENCOUNTER — Encounter (HOSPITAL_COMMUNITY): Payer: Self-pay | Admitting: Emergency Medicine

## 2015-11-20 ENCOUNTER — Inpatient Hospital Stay (HOSPITAL_COMMUNITY)
Admission: EM | Admit: 2015-11-20 | Discharge: 2015-11-24 | DRG: 292 | Disposition: A | Discharge status: Skilled Nursing Facility | Payer: Medicare Other | Attending: Family Medicine | Admitting: Family Medicine

## 2015-11-20 ENCOUNTER — Inpatient Hospital Stay (HOSPITAL_COMMUNITY): Payer: Medicare Other

## 2015-11-20 ENCOUNTER — Emergency Department (HOSPITAL_COMMUNITY): Payer: Medicare Other

## 2015-11-20 DIAGNOSIS — R32 Unspecified urinary incontinence: Secondary | ICD-10-CM | POA: Diagnosis present

## 2015-11-20 DIAGNOSIS — R17 Unspecified jaundice: Secondary | ICD-10-CM | POA: Diagnosis present

## 2015-11-20 DIAGNOSIS — E785 Hyperlipidemia, unspecified: Secondary | ICD-10-CM | POA: Diagnosis present

## 2015-11-20 DIAGNOSIS — I10 Essential (primary) hypertension: Secondary | ICD-10-CM | POA: Diagnosis present

## 2015-11-20 DIAGNOSIS — G4733 Obstructive sleep apnea (adult) (pediatric): Secondary | ICD-10-CM | POA: Diagnosis present

## 2015-11-20 DIAGNOSIS — D696 Thrombocytopenia, unspecified: Secondary | ICD-10-CM | POA: Diagnosis not present

## 2015-11-20 DIAGNOSIS — Z811 Family history of alcohol abuse and dependence: Secondary | ICD-10-CM | POA: Diagnosis not present

## 2015-11-20 DIAGNOSIS — M810 Age-related osteoporosis without current pathological fracture: Secondary | ICD-10-CM | POA: Diagnosis present

## 2015-11-20 DIAGNOSIS — Z96649 Presence of unspecified artificial hip joint: Secondary | ICD-10-CM | POA: Diagnosis present

## 2015-11-20 DIAGNOSIS — J84112 Idiopathic pulmonary fibrosis: Secondary | ICD-10-CM | POA: Diagnosis not present

## 2015-11-20 DIAGNOSIS — D649 Anemia, unspecified: Secondary | ICD-10-CM | POA: Diagnosis present

## 2015-11-20 DIAGNOSIS — I5043 Acute on chronic combined systolic (congestive) and diastolic (congestive) heart failure: Secondary | ICD-10-CM | POA: Diagnosis present

## 2015-11-20 DIAGNOSIS — I248 Other forms of acute ischemic heart disease: Secondary | ICD-10-CM | POA: Diagnosis present

## 2015-11-20 DIAGNOSIS — I5023 Acute on chronic systolic (congestive) heart failure: Secondary | ICD-10-CM | POA: Diagnosis not present

## 2015-11-20 DIAGNOSIS — I110 Hypertensive heart disease with heart failure: Secondary | ICD-10-CM | POA: Diagnosis not present

## 2015-11-20 DIAGNOSIS — I480 Paroxysmal atrial fibrillation: Secondary | ICD-10-CM | POA: Diagnosis not present

## 2015-11-20 DIAGNOSIS — E559 Vitamin D deficiency, unspecified: Secondary | ICD-10-CM | POA: Diagnosis present

## 2015-11-20 DIAGNOSIS — Z803 Family history of malignant neoplasm of breast: Secondary | ICD-10-CM

## 2015-11-20 DIAGNOSIS — J811 Chronic pulmonary edema: Secondary | ICD-10-CM | POA: Diagnosis present

## 2015-11-20 DIAGNOSIS — I482 Chronic atrial fibrillation: Secondary | ICD-10-CM | POA: Diagnosis not present

## 2015-11-20 DIAGNOSIS — M353 Polymyalgia rheumatica: Secondary | ICD-10-CM | POA: Diagnosis present

## 2015-11-20 DIAGNOSIS — I5022 Chronic systolic (congestive) heart failure: Secondary | ICD-10-CM | POA: Diagnosis present

## 2015-11-20 DIAGNOSIS — Z87891 Personal history of nicotine dependence: Secondary | ICD-10-CM

## 2015-11-20 DIAGNOSIS — R627 Adult failure to thrive: Secondary | ICD-10-CM | POA: Diagnosis present

## 2015-11-20 DIAGNOSIS — E039 Hypothyroidism, unspecified: Secondary | ICD-10-CM | POA: Diagnosis present

## 2015-11-20 DIAGNOSIS — R5383 Other fatigue: Secondary | ICD-10-CM | POA: Diagnosis present

## 2015-11-20 DIAGNOSIS — F329 Major depressive disorder, single episode, unspecified: Secondary | ICD-10-CM | POA: Diagnosis present

## 2015-11-20 DIAGNOSIS — I429 Cardiomyopathy, unspecified: Secondary | ICD-10-CM | POA: Diagnosis not present

## 2015-11-20 DIAGNOSIS — J810 Acute pulmonary edema: Secondary | ICD-10-CM | POA: Diagnosis not present

## 2015-11-20 DIAGNOSIS — F0390 Unspecified dementia without behavioral disturbance: Secondary | ICD-10-CM | POA: Diagnosis not present

## 2015-11-20 DIAGNOSIS — R413 Other amnesia: Secondary | ICD-10-CM | POA: Diagnosis present

## 2015-11-20 DIAGNOSIS — M7989 Other specified soft tissue disorders: Secondary | ICD-10-CM | POA: Diagnosis not present

## 2015-11-20 DIAGNOSIS — B0229 Other postherpetic nervous system involvement: Secondary | ICD-10-CM | POA: Diagnosis present

## 2015-11-20 DIAGNOSIS — Z823 Family history of stroke: Secondary | ICD-10-CM | POA: Diagnosis not present

## 2015-11-20 DIAGNOSIS — Z8249 Family history of ischemic heart disease and other diseases of the circulatory system: Secondary | ICD-10-CM

## 2015-11-20 DIAGNOSIS — F32A Depression, unspecified: Secondary | ICD-10-CM | POA: Diagnosis present

## 2015-11-20 DIAGNOSIS — K219 Gastro-esophageal reflux disease without esophagitis: Secondary | ICD-10-CM | POA: Diagnosis present

## 2015-11-20 LAB — BASIC METABOLIC PANEL
Anion gap: 9 (ref 5–15)
BUN: 17 mg/dL (ref 6–20)
CALCIUM: 9.4 mg/dL (ref 8.9–10.3)
CO2: 27 mmol/L (ref 22–32)
CREATININE: 0.99 mg/dL (ref 0.44–1.00)
Chloride: 105 mmol/L (ref 101–111)
GFR calc Af Amer: 59 mL/min — ABNORMAL LOW (ref >60)
GFR, EST NON AFRICAN AMERICAN: 51 mL/min — AB (ref >60)
GLUCOSE: 100 mg/dL — AB (ref 65–99)
POTASSIUM: 4.4 mmol/L (ref 3.5–5.1)
SODIUM: 141 mmol/L (ref 135–145)

## 2015-11-20 LAB — HEPATIC FUNCTION PANEL
ALK PHOS: 71 U/L (ref 38–126)
ALT: 45 U/L (ref 14–54)
AST: 53 U/L — ABNORMAL HIGH (ref 15–41)
Albumin: 3.6 g/dL (ref 3.5–5.0)
BILIRUBIN INDIRECT: 1.8 mg/dL — AB (ref 0.3–0.9)
BILIRUBIN TOTAL: 3.1 mg/dL — AB (ref 0.3–1.2)
Bilirubin, Direct: 1.3 mg/dL — ABNORMAL HIGH (ref 0.1–0.5)
TOTAL PROTEIN: 7.5 g/dL (ref 6.5–8.1)

## 2015-11-20 LAB — URINALYSIS, ROUTINE W REFLEX MICROSCOPIC
Glucose, UA: NEGATIVE mg/dL
Hgb urine dipstick: NEGATIVE
KETONES UR: NEGATIVE mg/dL
LEUKOCYTES UA: NEGATIVE
NITRITE: NEGATIVE
PROTEIN: NEGATIVE mg/dL
Specific Gravity, Urine: 1.014 (ref 1.005–1.030)
pH: 5.5 (ref 5.0–8.0)

## 2015-11-20 LAB — CBC
HEMATOCRIT: 46.9 % — AB (ref 36.0–46.0)
Hemoglobin: 16 g/dL — ABNORMAL HIGH (ref 12.0–15.0)
MCH: 37.6 pg — ABNORMAL HIGH (ref 26.0–34.0)
MCHC: 34.1 g/dL (ref 30.0–36.0)
MCV: 110.4 fL — ABNORMAL HIGH (ref 78.0–100.0)
PLATELETS: 105 10*3/uL — AB (ref 150–400)
RBC: 4.25 MIL/uL (ref 3.87–5.11)
RDW: 18.8 % — AB (ref 11.5–15.5)
WBC: 4.6 10*3/uL (ref 4.0–10.5)

## 2015-11-20 LAB — TSH: TSH: 5.431 u[IU]/mL — ABNORMAL HIGH (ref 0.350–4.500)

## 2015-11-20 LAB — TROPONIN I
TROPONIN I: 0.04 ng/mL — AB (ref <0.03)
Troponin I: 0.05 ng/mL (ref <0.03)

## 2015-11-20 LAB — T4, FREE: Free T4: 1.75 ng/dL — ABNORMAL HIGH (ref 0.61–1.12)

## 2015-11-20 LAB — BRAIN NATRIURETIC PEPTIDE: B NATRIURETIC PEPTIDE 5: 752.6 pg/mL — AB (ref 0.0–100.0)

## 2015-11-20 LAB — MRSA PCR SCREENING: MRSA by PCR: NEGATIVE

## 2015-11-20 LAB — CBG MONITORING, ED: GLUCOSE-CAPILLARY: 79 mg/dL (ref 65–99)

## 2015-11-20 MED ORDER — METOPROLOL TARTRATE 25 MG PO TABS
25.0000 mg | ORAL_TABLET | Freq: Once | ORAL | Status: AC
  Administered 2015-11-20: 25 mg via ORAL
  Filled 2015-11-20: qty 1

## 2015-11-20 MED ORDER — ONDANSETRON HCL 4 MG/2ML IJ SOLN: 4.0000 mg | Freq: Four times a day (QID) | INTRAMUSCULAR | Status: DC | PRN

## 2015-11-20 MED ORDER — CARVEDILOL 6.25 MG PO TABS
6.2500 mg | ORAL_TABLET | Freq: Two times a day (BID) | ORAL | Status: DC
  Filled 2015-11-20: qty 1

## 2015-11-20 MED ORDER — TRAMADOL HCL 50 MG PO TABS: 50.0000 mg | ORAL_TABLET | Freq: Four times a day (QID) | ORAL | Status: DC | PRN

## 2015-11-20 MED ORDER — ESCITALOPRAM OXALATE 10 MG PO TABS
20.0000 mg | ORAL_TABLET | Freq: Every day | ORAL | Status: DC
  Administered 2015-11-20 – 2015-11-24 (×5): 20 mg via ORAL
  Filled 2015-11-20 (×5): qty 2

## 2015-11-20 MED ORDER — ACETAMINOPHEN 650 MG RE SUPP: 650.0000 mg | Freq: Four times a day (QID) | RECTAL | Status: DC | PRN

## 2015-11-20 MED ORDER — CARVEDILOL 12.5 MG PO TABS
12.5000 mg | ORAL_TABLET | Freq: Two times a day (BID) | ORAL | Status: DC
  Filled 2015-11-20: qty 1

## 2015-11-20 MED ORDER — FUROSEMIDE 10 MG/ML IJ SOLN
40.0000 mg | Freq: Once | INTRAMUSCULAR | Status: AC
  Administered 2015-11-20: 40 mg via INTRAVENOUS
  Filled 2015-11-20: qty 4

## 2015-11-20 MED ORDER — SODIUM CHLORIDE 0.9% FLUSH
3.0000 mL | Freq: Two times a day (BID) | INTRAVENOUS | Status: DC
  Administered 2015-11-20 – 2015-11-24 (×7): 3 mL via INTRAVENOUS

## 2015-11-20 MED ORDER — FUROSEMIDE 10 MG/ML IJ SOLN
40.0000 mg | Freq: Two times a day (BID) | INTRAMUSCULAR | Status: DC
  Administered 2015-11-21 – 2015-11-23 (×4): 40 mg via INTRAVENOUS
  Filled 2015-11-20 (×4): qty 4

## 2015-11-20 MED ORDER — ASPIRIN EC 81 MG PO TBEC
81.0000 mg | DELAYED_RELEASE_TABLET | ORAL | Status: DC
  Administered 2015-11-21 – 2015-11-23 (×2): 81 mg via ORAL
  Filled 2015-11-20 (×3): qty 1

## 2015-11-20 MED ORDER — LEVOTHYROXINE SODIUM 75 MCG PO TABS
75.0000 ug | ORAL_TABLET | Freq: Every day | ORAL | Status: DC
  Administered 2015-11-21 – 2015-11-24 (×4): 75 ug via ORAL
  Filled 2015-11-20 (×4): qty 1

## 2015-11-20 MED ORDER — ENOXAPARIN SODIUM 30 MG/0.3ML ~~LOC~~ SOLN
30.0000 mg | SUBCUTANEOUS | Status: DC
  Administered 2015-11-20 – 2015-11-22 (×3): 30 mg via SUBCUTANEOUS
  Filled 2015-11-20 (×3): qty 0.3

## 2015-11-20 MED ORDER — ONDANSETRON HCL 4 MG PO TABS: 4.0000 mg | ORAL_TABLET | Freq: Four times a day (QID) | ORAL | Status: DC | PRN

## 2015-11-20 MED ORDER — ACETAMINOPHEN 325 MG PO TABS
650.0000 mg | ORAL_TABLET | Freq: Four times a day (QID) | ORAL | Status: DC | PRN
  Administered 2015-11-23: 650 mg via ORAL
  Filled 2015-11-20: qty 2

## 2015-11-20 MED ORDER — PANTOPRAZOLE SODIUM 40 MG PO TBEC
40.0000 mg | DELAYED_RELEASE_TABLET | Freq: Every day | ORAL | Status: DC
  Administered 2015-11-20 – 2015-11-24 (×5): 40 mg via ORAL
  Filled 2015-11-20 (×5): qty 1

## 2015-11-20 NOTE — Progress Notes (Signed)
EDCM spoke to patient and her family at bedside, daughter and husband.  [facility].  Patient's daughter reports patient has private duty nursing [facility].  Husband reports they come every day in the morning and assist patient with washing and dressing.  Family reports the caregivers come a few times a day and come in the evening to assist patient to bed.  Husband reports the patient doesn't walk well.  Family reports patient has had [facility] and has completed this service as they believe the patient would not show any further improvement per patient's daughter.  Patient's daughter reports she is interested in setting up palliative care for the patient.   Patient's pc is Dr. Gershon CraneStephen Fry.  Patient and patient's family are not opposed to short term rehab if needed.  [facility]  No further EDCM needs at this time.

## 2015-11-20 NOTE — ED Provider Notes (Signed)
Emergency Department Provider Note   I have reviewed the triage vital signs and the nursing notes.   HISTORY  Chief Complaint Failure To Thrive and Weakness   HPI [name] [name] is a 82 y.o. female with PMH of anemia, depression, HTN, HLD, and thyroid disease presents to the emergency department for evaluation of increased sleepiness and low appetite for the past several weeks. The patient denies any pain. Denies dysuria. The husband at bedside states that she is only interested in sleeping and is eating very little. She continues to be compliant with her medications. She denies any chest pain or difficulty breathing. Husband states that she did miss her medications this morning including her metoprolol and Coreg. He has not noticed any fevers. She did have a fall recently the past several days with no known head trauma. She does have a medication change with her Lasix being discontinued because of incontinence. Husband notes increased lower extremity edema.   Past Medical History:  Diagnosis Date  . Anemia   . Anxiety   . Arthritis   . Dementia   . Depression   . Diverticulosis of colon   . History of alcohol abuse   . Hyperlipidemia   . Hypertension   . IPF (idiopathic pulmonary fibrosis) (HCC)   . Irritable bowel syndrome   . OSA (obstructive sleep apnea)   . Osteoporosis   . PMR (polymyalgia rheumatica) (HCC)   . Postherpetic neuralgia   . Spinal stenosis   . Thyroid disease   . Vertebral fracture   . Vitamin D deficiency     Patient Active Problem List   Diagnosis Date Noted  . Pulmonary edema 11/20/2015  . Acute on chronic systolic CHF (congestive heart failure) (HCC) 11/20/2015  . Hyperbilirubinemia 11/20/2015  . Easy bruising 11/10/2015  . Bilateral leg edema 10/23/2015  . Urinary incontinence 10/23/2015  . PAF (paroxysmal atrial fibrillation) (HCC) 05/27/2015  . Fatigue 06/03/2014  . Hypothyroidism 04/02/2014  . Absolute anemia 01/01/2014  . IPF  (idiopathic pulmonary fibrosis) (HCC) 01/01/2014  . OA (osteoarthritis) of knee 07/02/2010  . Memory loss 07/02/2010  . Hyperlipidemia 03/08/2010  . Vitamin D deficiency 10/05/2009  . Obstructive sleep apnea 10/08/2007  . SYNDROME, CHRONIC PAIN 11/16/2006  . DIVERTICULOSIS, COLON 06/06/2006  . Depression 04/30/2006  . Essential hypertension 04/30/2006  . OSTEOPOROSIS 04/30/2006    Past Surgical History:  Procedure Laterality Date  . APPENDECTOMY    . BREAST BIOPSY    . CATARACT EXTRACTION    . DILATION AND CURETTAGE OF UTERUS     x several  . KNEE ARTHROSCOPY  10/2007 and 10/2010   left  . REPLACEMENT TOTAL KNEE  august 2012  . ROTATOR CUFF REPAIR    . VERTEBROPLASTY     x 3      Allergies Celecoxib; Clindamycin hcl; Rofecoxib; and Sertraline hcl  Family History  Problem Relation Age of Onset  . Stroke Father 7076  . Heart attack Father   . Coronary artery disease Father 5140  . Alcohol abuse Mother   . Breast cancer Sister   . Heart disease Sister   . Colon cancer Neg Hx     Social History Social History  Substance Use Topics  . Smoking status: Former Smoker    Packs/day: 1.00    Years: 20.00    Types: Cigarettes    Quit date: 01/25/1988  . Smokeless tobacco: Never Used  . Alcohol use 0.0 oz/week     Comment: 2 glasses every  day    Review of Systems  Constitutional: No fever/chills. Positive increased fatigue.  Eyes: No visual changes. ENT: No sore throat. Cardiovascular: Denies chest pain. Positive LE edema.  Respiratory: Positive shortness of breath. Gastrointestinal: No abdominal pain.  No nausea, no vomiting.  No diarrhea.  No constipation. Genitourinary: Negative for dysuria. Musculoskeletal: Negative for back pain. Skin: Negative for rash. Neurological: Negative for headaches, focal weakness or numbness.  10-point ROS otherwise negative.  ____________________________________________   PHYSICAL EXAM:  VITAL SIGNS: ED Triage Vitals  Enc  Vitals Group     BP 11/20/15 1052 107/75     Pulse Rate 11/20/15 1052 87     Resp 11/20/15 1052 16     Temp 11/20/15 1052 97.9 F (36.6 C)     Temp Source 11/20/15 1052 Oral     SpO2 11/20/15 1052 97 %     Weight 11/20/15 1056 140 lb (63.5 kg)   Constitutional: Alert and oriented. Well appearing and in no acute distress. Eyes: Conjunctivae are normal.  Head: Atraumatic. Nose: No congestion/rhinnorhea. Mouth/Throat: Mucous membranes are moist.  Oropharynx non-erythematous. Neck: No stridor. Cardiovascular: Normal rate, regular rhythm. Good peripheral circulation. Grossly normal heart sounds.   Respiratory: Normal respiratory effort.  No retractions. Lungs with rales at the bases.  Gastrointestinal: Soft and nontender. No distention.  Musculoskeletal: No lower extremity tenderness. Positive 2+ pitting edema. No gross deformities of extremities. Neurologic:  Normal speech and language. No gross focal neurologic deficits are appreciated.  Skin:  Skin is warm, dry and intact. No rash noted.  ____________________________________________   LABS (all labs ordered are listed, but only abnormal results are displayed)  Labs Reviewed  BASIC METABOLIC PANEL - Abnormal; Notable for the following:       Result Value   Glucose, Bld 100 (*)    GFR calc non Af Amer 51 (*)    GFR calc Af Amer 59 (*)    All other components within normal limits  CBC - Abnormal; Notable for the following:    Hemoglobin 16.0 (*)    HCT 46.9 (*)    MCV 110.4 (*)    MCH 37.6 (*)    RDW 18.8 (*)    Platelets 105 (*)    All other components within normal limits  URINALYSIS, ROUTINE W REFLEX MICROSCOPIC (NOT AT [facility]) - Abnormal; Notable for the following:    Color, Urine AMBER (*)    Bilirubin Urine SMALL (*)    All other components within normal limits  BRAIN NATRIURETIC PEPTIDE - Abnormal; Notable for the following:    B Natriuretic Peptide 752.6 (*)    All other components within normal limits  TROPONIN I -  Abnormal; Notable for the following:    Troponin I 0.04 (*)    All other components within normal limits  TSH - Abnormal; Notable for the following:    TSH 5.431 (*)    All other components within normal limits  HEPATIC FUNCTION PANEL - Abnormal; Notable for the following:    AST 53 (*)    Total Bilirubin 3.1 (*)    Bilirubin, Direct 1.3 (*)    Indirect Bilirubin 1.8 (*)    All other components within normal limits  T4, FREE  BASIC METABOLIC PANEL  CBC  CBG MONITORING, ED   ____________________________________________  EKG   EKG Interpretation  Date/Time:  Friday November 20 2015 11:42:50 EDT Ventricular Rate:  130 PR Interval:    QRS Duration: 93 QT Interval:  379 QTC Calculation:  558 R Axis:   129 Text Interpretation:  Atrial fibrillation Borderline low voltage, extremity leads Probable right ventricular hypertrophy Prolonged QT interval No STEMI.  A-fib new from 2011 tracing Confirmed by LONG MD, JOSHUA [phone]) on 11/20/2015 12:43:26 PM       ____________________________________________  RADIOLOGY  Dg Chest 2 View  Result Date: 11/20/2015 CLINICAL DATA:  Per spouse patient has had increased weakness, lethargy, decreased appetite x several weeks. ; HTN; ex smoker EXAM: CHEST  2 VIEW COMPARISON:  Chest CT, 02/07/2014.  Chest radiograph, 12/27/2013. FINDINGS: Cardiac silhouette is mildly enlarged. No mediastinal or hilar masses. There are irregularly thickened interstitial markings, increased when compared to the prior chest radiograph. There also small bilateral pleural effusions and thickening along the fissures. No pneumothorax. Skeletal structures are demineralized. There are compression fractures of the lower thoracic and lumbar spine several treated with previous vertebroplasty. These are stable. IMPRESSION: 1. Findings are consistent with congestive heart failure and interstitial edema superimposed on chronic interstitial lung disease. Electronically Signed   By: Amie Portland M.D.   On: 11/20/2015 13:28   Ct Head Wo Contrast  Result Date: 11/20/2015 CLINICAL DATA:  Increasing weakness and lethargy with decreased appetite for several weeks. Patient fell 3 days ago. EXAM: CT HEAD WITHOUT CONTRAST TECHNIQUE: Contiguous axial images were obtained from the base of the skull through the vertex without intravenous contrast. COMPARISON:  MRI brain 01/06/2011.  CT head 04/29/2004 FINDINGS: Brain: There is no evidence of acute intracranial hemorrhage, mass lesion, brain edema or extra-axial fluid collection. There is stable atrophy with prominence of the ventricles and subarachnoid spaces. Atrophy appears greatest within the right frontal lobe. There is no CT evidence of acute cortical infarction. Mild periventricular white matter disease appears unchanged. Vascular: Intracranial vascular calcifications noted. Skull: Negative for fracture or focal lesion. Sinuses/Orbits: The visualized paranasal sinuses and mastoid air cells are clear. No orbital abnormalities are seen. Other: None. IMPRESSION: No acute intracranial findings. Stable atrophy and minimal periventricular white matter disease. Electronically Signed   By: Carey Bullocks M.D.   On: 11/20/2015 13:19    ____________________________________________   PROCEDURES  Procedure(s) performed:   Procedures  None ____________________________________________   INITIAL IMPRESSION / ASSESSMENT AND PLAN / ED COURSE  Pertinent labs & imaging results that were available during my care of the patient were reviewed by me and considered in my medical decision making (see chart for details).  Patient resents emergency department for evaluation of increased drowsiness and decreased PO intake. The patient has A. fib with RVR on the monitor. Plan to restart her home rate control medications. Her oxygen saturation is low with faint rales at the bases. Unclear if this is continued eating significantly to her hypoxemia whether  she has underlying infectious process. Plan for CT scan of the head given her recent fall and altered mental status. Added thyroid studies in addition to BNP and CXR.   Troponin slightly elevated. Suspect this is fluid related. Patient with edema on CXR and with new O2 requirement. Gave lasix. No clinical concern for underlying infection. Discussed admission with the hospitalist and placed inpatient orders. Updated patient and family at bedside.   Reviewed all labs, imaging, and EKG.  ____________________________________________  FINAL CLINICAL IMPRESSION(S) / ED DIAGNOSES  Final diagnoses:  Acute pulmonary edema (HCC)     MEDICATIONS GIVEN DURING THIS VISIT:  Medications  aspirin EC tablet 81 mg (not administered)  escitalopram (LEXAPRO) tablet 20 mg (not administered)  traMADol (ULTRAM) tablet 50 mg (  not administered)  levothyroxine (SYNTHROID, LEVOTHROID) tablet 75 mcg (not administered)  pantoprazole (PROTONIX) EC tablet 40 mg (not administered)  carvedilol (COREG) tablet 12.5 mg (not administered)  enoxaparin (LOVENOX) injection 30 mg (not administered)  sodium chloride flush (NS) 0.9 % injection 3 mL (not administered)  acetaminophen (TYLENOL) tablet 650 mg (not administered)    Or  acetaminophen (TYLENOL) suppository 650 mg (not administered)  ondansetron (ZOFRAN) tablet 4 mg (not administered)    Or  ondansetron (ZOFRAN) injection 4 mg (not administered)  furosemide (LASIX) injection 40 mg (not administered)  metoprolol tartrate (LOPRESSOR) tablet 25 mg (25 mg Oral Given 11/20/15 1256)  furosemide (LASIX) injection 40 mg (40 mg Intravenous Given 11/20/15 1503)     NEW OUTPATIENT MEDICATIONS STARTED DURING THIS VISIT:  None   Note:  This document was prepared using Dragon voice recognition software and may include unintentional dictation errors.  Alona Bene, MD Emergency Medicine  Maia Plan, MD 11/20/15 [phone]

## 2015-11-20 NOTE — H&P (Signed)
TRH H&P   Patient Demographics:    [name] [name], is a 82 y.o. female  MRN: [id number]   DOB - Sep 11, 1930  Admit Date - 11/20/2015  Outpatient Primary MD for the patient is Nelwyn Salisbury, MD  Referring MD/NP/PA: Dr Jacqulyn Bath  Outpatient Specialists: Cardiology Dr Anne Fu, Pulmonary Dr. Kathrin Penner  Patient coming from: Home  Chief Complaint  Patient presents with  . Failure To Thrive  . Weakness      HPI:    [name] [name]  is a 82 y.o. female, With past medical history of anemia, depression, hypertension, hyperlipidemia, hypothyroidism, cardiomyopathy, systolic CHF 25-30%, atrial fibrillation, angiographic pulmonary fibrosis, patient presents secondary to complaints of generalized weakness and fatigue over last few weeks, husband and daughter at bedside, report patient is sleeping longer hour, more frequently, more fatigued and weak, with poor appetite, as well they report worsening lower extremity edema, as she stopped her Lasix over last 3 weeks given urinary incontinence and bathroom trips, as well patient used to be on Eliquis for A. fib which has been stopped given frequent epistaxis, easy bruising and bleeding from skin, they denies any chest pain, fever, chills, hemoptysis, shortness of breath, cough, nausea or vomiting. - in ED CT head with no acute findings, chest x-ray consistent with CHF, has elevated BNP, and troponin of 0.04, and total bili of 3.1, received IV Lasix in ED, and hospitalist requested to admit     Review of systems:    In addition to the HPI above, No Fever-chills, No Headache, No changes with Vision or hearing, No problems swallowing food or Liquids, No Chest pain, Cough or Shortness of Breath,Reports worsening lower extremity edema No Abdominal pain, No Nausea or Vommitting, Bowel movements are regular, No Blood in stool or Urine, No dysuria, No new skin  rashes or bruises, No new joints pains-aches,  No new weakness, tingling, numbness in any extremity, reports weakness and fatigue No recent weight gain or loss, No polyuria, polydypsia or polyphagia, No significant Mental Stressors.  A full 10 point Review of Systems was done, except as stated above, all other Review of Systems were negative.   With Past History of the following :    Past Medical History:  Diagnosis Date  . Anemia   . Anxiety   . Arthritis   . Dementia   . Depression   . Diverticulosis of colon   . History of alcohol abuse   . Hyperlipidemia   . Hypertension   . IPF (idiopathic pulmonary fibrosis) (HCC)   . Irritable bowel syndrome   . OSA (obstructive sleep apnea)   . Osteoporosis   . PMR (polymyalgia rheumatica) (HCC)   . Postherpetic neuralgia   . Spinal stenosis   . Thyroid disease   . Vertebral fracture   . Vitamin D deficiency       Past Surgical History:  Procedure Laterality Date  .  APPENDECTOMY    . BREAST BIOPSY    . CATARACT EXTRACTION    . DILATION AND CURETTAGE OF UTERUS     x several  . KNEE ARTHROSCOPY  10/2007 and 10/2010   left  . REPLACEMENT TOTAL KNEE  august 2012  . ROTATOR CUFF REPAIR    . VERTEBROPLASTY     x 3      Social History:     Social History  Substance Use Topics  . Smoking status: Former Smoker    Packs/day: 1.00    Years: 20.00    Types: Cigarettes    Quit date: 01/25/1988  . Smokeless tobacco: Never Used  . Alcohol use 0.0 oz/week     Comment: 2 glasses every day     Lives - at home  Mobility - with assistance    Family History :     Family History  Problem Relation Age of Onset  . Stroke Father 49  . Heart attack Father   . Coronary artery disease Father 35  . Alcohol abuse Mother   . Breast cancer Sister   . Heart disease Sister   . Colon cancer Neg Hx       Home Medications:   Prior to Admission medications   Medication Sig Start Date End Date Taking? Authorizing Provider    aspirin EC 81 MG tablet Take one tablet every other day Patient taking differently: Take 81 mg by mouth daily.  11/10/15  Yes Nelwyn Salisbury, MD  escitalopram (LEXAPRO) 20 MG tablet Take 20 mg by mouth daily.   Yes Historical Provider, MD  ferrous sulfate 324 (65 FE) MG TBEC Take 1 tablet (325 mg total) by mouth 2 (two) times daily. Patient taking differently: Take 1 tablet by mouth at bedtime.  01/01/14  Yes Nelwyn Salisbury, MD  levothyroxine (SYNTHROID, LEVOTHROID) 75 MCG tablet Take 75 mcg by mouth daily before breakfast.   Yes Historical Provider, MD  metoprolol tartrate (LOPRESSOR) 25 MG tablet Take 25 mg by mouth daily with breakfast.  05/15/15  Yes Historical Provider, MD  pantoprazole (PROTONIX) 40 MG tablet Take 1 tablet (40 mg total) by mouth daily. Patient taking differently: Take 40 mg by mouth daily with breakfast.  05/15/15  Yes Azalee Course, PA  carvedilol (COREG) 6.25 MG tablet Take 1 tablet (6.25 mg total) by mouth 2 (two) times daily. Patient not taking: Reported on 11/20/2015 06/25/15   Jake Bathe, MD  clobetasol ointment (TEMOVATE) 0.05 % Apply 1 application topically 2 (two) times daily. 11/10/15   Nelwyn Salisbury, MD  traMADol (ULTRAM) 50 MG tablet Take 50 mg by mouth every 6 (six) hours as needed for moderate pain.    Historical Provider, MD  Vitamin D, Ergocalciferol, (DRISDOL) 50000 units CAPS capsule TAKE 1 CAPSULE ONCE A WEEK. 10/13/15   Nelwyn Salisbury, MD     Allergies:     Allergies  Allergen Reactions  . Celecoxib     REACTION: unspecified  . Clindamycin Hcl Diarrhea  . Rofecoxib     REACTION: unspecified  . Sertraline Hcl     REACTION: unspecified     Physical Exam:   Vitals  Blood pressure 116/94, pulse 109, temperature 97.9 F (36.6 C), temperature source Oral, resp. rate 22, weight 63.5 kg (140 lb), SpO2 95 %.   1. General Frail elderly female lying in bed in NAD,    2. Normal affect and insight, Not Suicidal or Homicidal, Awake Alert, Oriented X  3.  3. No F.N deficits, ALL C.Nerves Intact, Strength 5/5 all 4 extremities, Sensation intact all 4 extremities, Plantars down going.  4. Ears and Eyes appear Normal, Conjunctivae clear, PERRLA. Moist Oral Mucosa.  5. Supple Neck, +5 cm JVD, No cervical lymphadenopathy appriciated, No Carotid Bruits. Bilateral lower extremity edema, right worse than left  6. Symmetrical Chest wall movement, Diminished air entry on the basis,Wheezing  7. Irregular irregular, tachycardic, No Gallops, Rubs or Murmurs, No Parasternal Heave.  8. Positive Bowel Sounds, Abdomen Soft, No tenderness,No rebound -guarding or rigidity.  9.  No Cyanosis, Normal Skin Turgor, No Skin Rash or Bruise.  10. Good muscle tone,  joints appear normal , no effusions, Normal ROM.  11. No Palpable Lymph Nodes in Neck or Axillae     Data Review:    CBC  Recent Labs Lab 11/20/15 1120  WBC 4.6  HGB 16.0*  HCT 46.9*  PLT 105*  MCV 110.4*  MCH 37.6*  MCHC 34.1  RDW 18.8*   ------------------------------------------------------------------------------------------------------------------  Chemistries   Recent Labs Lab 11/20/15 1120 11/20/15 1219  NA 141  --   K 4.4  --   CL 105  --   CO2 27  --   GLUCOSE 100*  --   BUN 17  --   CREATININE 0.99  --   CALCIUM 9.4  --   AST  --  53*  ALT  --  45  ALKPHOS  --  71  BILITOT  --  3.1*   ------------------------------------------------------------------------------------------------------------------ estimated creatinine clearance is 38.6 mL/min (by C-G formula based on SCr of 0.99 mg/dL). ------------------------------------------------------------------------------------------------------------------  Recent Labs  11/20/15 1331  TSH 5.431*    Coagulation profile No results for input(s): INR, PROTIME in the last 168 hours. ------------------------------------------------------------------------------------------------------------------- No results  for input(s): DDIMER in the last 72 hours. -------------------------------------------------------------------------------------------------------------------  Cardiac Enzymes  Recent Labs Lab 11/20/15 1219  TROPONINI 0.04*   ------------------------------------------------------------------------------------------------------------------    Component Value Date/Time   BNP 752.6 (H) 11/20/2015 1120     ---------------------------------------------------------------------------------------------------------------  Urinalysis    Component Value Date/Time   COLORURINE YELLOW 09/07/2010 1446   APPEARANCEUR CLEAR 09/07/2010 1446   LABSPEC 1.017 09/07/2010 1446   PHURINE 5.5 09/07/2010 1446   GLUCOSEU NEGATIVE 09/07/2010 1446   HGBUR SMALL (A) 09/07/2010 1446   BILIRUBINUR n 05/12/2015 1435   KETONESUR NEGATIVE 09/07/2010 1446   PROTEINUR n 05/12/2015 1435   PROTEINUR NEGATIVE 09/07/2010 1446   UROBILINOGEN 0.2 05/12/2015 1435   UROBILINOGEN 0.2 09/07/2010 1446   NITRITE n 05/12/2015 1435   NITRITE NEGATIVE 09/07/2010 1446   LEUKOCYTESUR Negative 05/12/2015 1435    ----------------------------------------------------------------------------------------------------------------   Imaging Results:    Dg Chest 2 View  Result Date: 11/20/2015 CLINICAL DATA:  Per spouse patient has had increased weakness, lethargy, decreased appetite x several weeks. ; HTN; ex smoker EXAM: CHEST  2 VIEW COMPARISON:  Chest CT, 02/07/2014.  Chest radiograph, 12/27/2013. FINDINGS: Cardiac silhouette is mildly enlarged. No mediastinal or hilar masses. There are irregularly thickened interstitial markings, increased when compared to the prior chest radiograph. There also small bilateral pleural effusions and thickening along the fissures. No pneumothorax. Skeletal structures are demineralized. There are compression fractures of the lower thoracic and lumbar spine several treated with previous  vertebroplasty. These are stable. IMPRESSION: 1. Findings are consistent with congestive heart failure and interstitial edema superimposed on chronic interstitial lung disease. Electronically Signed   By: Amie Portland M.D.   On: 11/20/2015 13:28   Ct Head Wo Contrast  Result  Date: 11/20/2015 CLINICAL DATA:  Increasing weakness and lethargy with decreased appetite for several weeks. Patient fell 3 days ago. EXAM: CT HEAD WITHOUT CONTRAST TECHNIQUE: Contiguous axial images were obtained from the base of the skull through the vertex without intravenous contrast. COMPARISON:  MRI brain 01/06/2011.  CT head 04/29/2004 FINDINGS: Brain: There is no evidence of acute intracranial hemorrhage, mass lesion, brain edema or extra-axial fluid collection. There is stable atrophy with prominence of the ventricles and subarachnoid spaces. Atrophy appears greatest within the right frontal lobe. There is no CT evidence of acute cortical infarction. Mild periventricular white matter disease appears unchanged. Vascular: Intracranial vascular calcifications noted. Skull: Negative for fracture or focal lesion. Sinuses/Orbits: The visualized paranasal sinuses and mastoid air cells are clear. No orbital abnormalities are seen. Other: None. IMPRESSION: No acute intracranial findings. Stable atrophy and minimal periventricular white matter disease. Electronically Signed   By: Carey BullocksWilliam  Veazey M.D.   On: 11/20/2015 13:19    My personal review of EKG: Rhythm A. fib with RVR Rate  130 /min, QTc 558 , no Acute ST changes   Assessment & Plan:    Active Problems:   Depression   Essential hypertension   Hyperlipidemia   Memory loss   IPF (idiopathic pulmonary fibrosis) (HCC)   Hypothyroidism   Fatigue   PAF (paroxysmal atrial fibrillation) (HCC)   Urinary incontinence   Pulmonary edema   Acute on chronic systolic CHF (congestive heart failure) (HCC)   Hyperbilirubinemia  Acute on chronic systolic CHF/cardiomyopathy - Most  recent echo in 06/03/15, with EF 25-30%, with diffuse hypokinesis - Patient presents with lower extremity edema, evidence of volume overload on chest x-ray, elevated BNP, this all started after her PCP stopped her Lasix for urinary incontinence. - Patient admitted under CHF pathway, check daily weight, strict ins and outs, will start on Lasix 40 mg IV twice a day. - Patient was taking both metoprolol and cortical to home(Coreg was given by cardiology, metoprolol given by PCP), so will DC metoprolol, increase corticosteroids from 6.25-12.5 mg for better heart rate control, patient not on a CE I/ARB secondary to soft blood pressure per  cardiology note as an outpatient. - First troponin 0.04, this is most likely due to her acute CHF, will trend troponins. - Patient with prolonged QTC, will check magnesium level, will repeat EKG in a.m. - Cardiology consulted, and to see in a.m.Marland Kitchen.  Hyperbilirubinemia - Denies any abdominal pain, will check right upper quadrant ultrasound  Lower extremity edema right more than left - Most likely due to CHF, will check right lower extremity venous Doppler  Atrial fibrillation - Patient used to be on Eliquis, started by her PCP on 11/02/2015 secondary to easy bruising, recurrent epistaxis and skin bleed, and started her on aspirin 81 mg every other day, I discussed this with the family, and they do understand the increased risk for acute CVA with holding Eliquis. - Continue Coreg, will increase dose for better heart rate control  Ideopathic pulmonary fibrosis - Followed by Dr. Kathrin PennerMcQuade, seems to be stable  Generalized weakness and fatigue - This is most likely related to significant systolic CHF with EF 25-30% -  consult PT  Hypothyroid - cont with synthroid  GERD - Continue with PPI    DVT Prophylaxis Lovenox - SCDs  AM Labs Ordered, also please review Full Orders  Family Communication: Admission, patients condition and plan of care including tests being  ordered have been discussed with the patient, husband anddaughter who indicate understanding  and agree with the plan and Code Status.  Code Status Full  Likely DC to  Home  Condition GUARDED    Consults called: cardiology called and to see in am.  Admission status: Inpatient  Time spent in minutes : 70 minutes   Abelino Tippin M.D on 11/20/2015 at 4:29 PM  Between 7am to 7pm - Pager - [phone]. After 7pm go to [email or web address] - password [facility]  [facility] - Office  [phone]

## 2015-11-20 NOTE — ED Triage Notes (Addendum)
Per spouse patient has had increased weakness, lethargy, decreased appetite x several weeks.  Daughter reports she eats "1/4 of a sandwich a day".  States that she stayed on the sofa on Saturday and didn't get up until Sunday night.  States she doesn't get up to use the restroom.  Reports until recently patient has not been ambulating as per her normal.  Denies fevers, N/V/D.  Patient was seen previously for blood in her mouth-states this continues.  Daughter also reports she was taken off lasix 3 weeks ago due to incontinence-patient has swelling to BLE and increased weight gain after discontinuing lasix.

## 2015-11-21 ENCOUNTER — Inpatient Hospital Stay (HOSPITAL_COMMUNITY): Payer: Medicare Other

## 2015-11-21 DIAGNOSIS — M7989 Other specified soft tissue disorders: Secondary | ICD-10-CM

## 2015-11-21 DIAGNOSIS — E038 Other specified hypothyroidism: Secondary | ICD-10-CM

## 2015-11-21 DIAGNOSIS — I10 Essential (primary) hypertension: Secondary | ICD-10-CM

## 2015-11-21 DIAGNOSIS — I482 Chronic atrial fibrillation: Secondary | ICD-10-CM

## 2015-11-21 DIAGNOSIS — I5023 Acute on chronic systolic (congestive) heart failure: Secondary | ICD-10-CM

## 2015-11-21 DIAGNOSIS — J84112 Idiopathic pulmonary fibrosis: Secondary | ICD-10-CM

## 2015-11-21 DIAGNOSIS — R32 Unspecified urinary incontinence: Secondary | ICD-10-CM

## 2015-11-21 DIAGNOSIS — J810 Acute pulmonary edema: Secondary | ICD-10-CM

## 2015-11-21 DIAGNOSIS — E785 Hyperlipidemia, unspecified: Secondary | ICD-10-CM

## 2015-11-21 DIAGNOSIS — I480 Paroxysmal atrial fibrillation: Secondary | ICD-10-CM

## 2015-11-21 LAB — CBC
HCT: 43.7 % (ref 36.0–46.0)
HEMOGLOBIN: 14.7 g/dL (ref 12.0–15.0)
MCH: 37.3 pg — AB (ref 26.0–34.0)
MCHC: 33.6 g/dL (ref 30.0–36.0)
MCV: 110.9 fL — ABNORMAL HIGH (ref 78.0–100.0)
PLATELETS: 94 10*3/uL — AB (ref 150–400)
RBC: 3.94 MIL/uL (ref 3.87–5.11)
RDW: 18.9 % — AB (ref 11.5–15.5)
WBC: 5.2 10*3/uL (ref 4.0–10.5)

## 2015-11-21 LAB — MAGNESIUM: Magnesium: 1.4 mg/dL — ABNORMAL LOW (ref 1.7–2.4)

## 2015-11-21 LAB — TROPONIN I: TROPONIN I: 0.06 ng/mL — AB (ref <0.03)

## 2015-11-21 MED ORDER — MAGNESIUM SULFATE 2 GM/50ML IV SOLN
2.0000 g | Freq: Once | INTRAVENOUS | Status: AC
  Administered 2015-11-21: 2 g via INTRAVENOUS
  Filled 2015-11-21: qty 50

## 2015-11-21 MED ORDER — CARVEDILOL 3.125 MG PO TABS
3.1250 mg | ORAL_TABLET | Freq: Two times a day (BID) | ORAL | Status: DC
  Administered 2015-11-21 – 2015-11-24 (×6): 3.125 mg via ORAL
  Filled 2015-11-21 (×7): qty 1

## 2015-11-21 NOTE — Progress Notes (Signed)
PROGRESS NOTE    [name] [name]  UJW:[id number]  DOB: 06/11/1930  DOA: 11/20/2015 PCP: Nelwyn SalisburyFRY,STEPHEN A, MD Outpatient Specialists:   Hospital course: HPI:  The patient has a history of systolic HF with an EF of 25 -56%30%.  She also has permanent atrial fib.    She presented with complaints of generalized weakness.  She has not been taking Lasix over the past 3 weeks secondary to incontinence and inconvenience according to the notes.  She is pleasantly confused.  She does know that she is in the hospital and remembered eventually that she lived in a retirement home. She does not endorse any complaints however,  The patient denies any new symptoms such as chest discomfort, neck or arm discomfort. There has been no new shortness of breath, PND or orthopnea. There have been no reported palpitations, presyncope or syncope .   In the ED her BNP was elevated as below and she has a very mild troponin increase.   We are called to help with these issues.    Assessment & Plan:   Acute on chronic systolic CHF/cardiomyopathy - Most recent echo in 06/03/15, with EF 25-30%, with diffuse hypokinesis - Patient presents with lower extremity edema, evidence of volume overload on chest x-ray, elevated BNP, this all started after her PCP stopped her Lasix for urinary incontinence. - Patient admitted under CHF pathway, check daily weight, strict ins and outs, will start on Lasix 40 mg IV twice a day. - Patient was taking both metoprolol and Coreg was given by cardiology, metoprolol given by PCP,  DC metoprolol, continue Coreg for better heart rate control, patient not on a CE I/ARB secondary to soft blood pressure per cardiology note as an outpatient. - First troponin 0.04, this is most likely due to her acute CHF, trended troponins have been flat. - Patient with prolonged QTC, low magnesium will be treated IV supplement. Continue telemetry monitoring. - Cardiology consulted-see notes.  Hyperbilirubinemia -  Denies any abdominal pain,  right upper quadrant ultrasound reveals moderate right pleural effusion otherwise no significant findings.  Lower extremity edema right more than left - Most likely due to CHF, will check right lower extremity venous Doppler  Atrial fibrillation - Patient used to be on Eliquis, started by her PCP on 11/02/2015 secondary to easy bruising, recurrent epistaxis and skin bleed, and started her on aspirin 81 mg every other day, I discussed this with the family, and they do understand the increased risk for acute CVA with holding Eliquis. - Continue Coreg as tolerated. Appreciate cardiology consultation.  Ideopathic pulmonary fibrosis - Followed by Dr. Kendrick FriesMcQuaid, seems to be stable  Generalized weakness and fatigue - This is most likely related to significant systolic CHF with EF 25-30% and the combination of 2 beta blockers which has been discontinued. -  consult PT  Hypothyroid - cont with synthroid  GERD - Continue with PPI   DVT Prophylaxis Lovenox - SCDs  AM Labs Ordered, also please review Full Orders  Family Communication: Admission, patients condition and plan of care including tests being ordered have been discussed with the patient, husband anddaughter who indicate understanding and agree with the plan and Code Status.  Code Status Full  Consultants:  cardiology  Subjective: Patient reports that she's feeling better today. She diuresed quite a bit over night.  Objective: Vitals:   11/20/15 1630 11/20/15 1710 11/20/15 2110 11/21/15 0504  BP: 135/95 110/70 (!) 91/54 (!) 86/68  Pulse: 99 (!) 108 (!) 115 (!) 108  Resp: 22 20 20 20   Temp:  97.6 F (36.4 C) 98.2 F (36.8 C)   TempSrc:  Oral Oral Oral  SpO2: 98% 98% 100% 98%  Weight:  64.5 kg (142 lb 4.8 oz)  63.2 kg (139 lb 5.3 oz)  Height:  5\' 3"  (1.6 m)      Intake/Output Summary (Last 24 hours) at 11/21/15 0924 Last data filed at 11/21/15 0436  Gross per 24 hour  Intake               300 ml  Output              750 ml  Net             -450 ml   Filed Weights   11/20/15 1056 11/20/15 1710 11/21/15 0504  Weight: 63.5 kg (140 lb) 64.5 kg (142 lb 4.8 oz) 63.2 kg (139 lb 5.3 oz)    Exam:  General exam: Elderly, chronically ill-appearing female in no apparent distress, cooperative and pleasant. Respiratory system:  No increased work of breathing diminished breath sounds right lower lobe. Cardiovascular system: S1 & S2 heard, irregularly irregular and tachycardic.  Gastrointestinal system: Abdomen is nondistended, soft and nontender. Normal bowel sounds heard. Central nervous system: Alert and oriented. No focal neurological deficits. Extremities: Diffuse upper and lower extremity edema.  Data Reviewed: Basic Metabolic Panel:  Recent Labs Lab 11/20/15 1120 11/21/15 0209  NA 141  --   K 4.4  --   CL 105  --   CO2 27  --   GLUCOSE 100*  --   BUN 17  --   CREATININE 0.99  --   CALCIUM 9.4  --   MG  --  1.4*   Liver Function Tests:  Recent Labs Lab 11/20/15 1219  AST 53*  ALT 45  ALKPHOS 71  BILITOT 3.1*  PROT 7.5  ALBUMIN 3.6   No results for input(s): LIPASE, AMYLASE in the last 168 hours. No results for input(s): AMMONIA in the last 168 hours. CBC:  Recent Labs Lab 11/20/15 1120 11/21/15 0209  WBC 4.6 5.2  HGB 16.0* 14.7  HCT 46.9* 43.7  MCV 110.4* 110.9*  PLT 105* 94*   Cardiac Enzymes:  Recent Labs Lab 11/20/15 1219 11/20/15 2003 11/21/15 0209  TROPONINI 0.04* 0.05* 0.06*   CBG (last 3)   Recent Labs  11/20/15 1137  GLUCAP 79   Recent Results (from the past 240 hour(s))  MRSA PCR Screening     Status: None   Collection Time: 11/20/15  7:07 PM  Result Value Ref Range Status   MRSA by PCR NEGATIVE NEGATIVE Final    Comment:        The GeneXpert MRSA Assay (FDA approved for NASAL specimens only), is one component of a comprehensive MRSA colonization surveillance program. It is not intended to diagnose  MRSA infection nor to guide or monitor treatment for MRSA infections.      Studies: Dg Chest 2 View  Result Date: 11/20/2015 CLINICAL DATA:  Per spouse patient has had increased weakness, lethargy, decreased appetite x several weeks. ; HTN; ex smoker EXAM: CHEST  2 VIEW COMPARISON:  Chest CT, 02/07/2014.  Chest radiograph, 12/27/2013. FINDINGS: Cardiac silhouette is mildly enlarged. No mediastinal or hilar masses. There are irregularly thickened interstitial markings, increased when compared to the prior chest radiograph. There also small bilateral pleural effusions and thickening along the fissures. No pneumothorax. Skeletal structures are demineralized. There are compression fractures of the lower thoracic  and lumbar spine several treated with previous vertebroplasty. These are stable. IMPRESSION: 1. Findings are consistent with congestive heart failure and interstitial edema superimposed on chronic interstitial lung disease. Electronically Signed   By: Amie Portland M.D.   On: 11/20/2015 13:28   Ct Head Wo Contrast  Result Date: 11/20/2015 CLINICAL DATA:  Increasing weakness and lethargy with decreased appetite for several weeks. Patient fell 3 days ago. EXAM: CT HEAD WITHOUT CONTRAST TECHNIQUE: Contiguous axial images were obtained from the base of the skull through the vertex without intravenous contrast. COMPARISON:  MRI brain 01/06/2011.  CT head 04/29/2004 FINDINGS: Brain: There is no evidence of acute intracranial hemorrhage, mass lesion, brain edema or extra-axial fluid collection. There is stable atrophy with prominence of the ventricles and subarachnoid spaces. Atrophy appears greatest within the right frontal lobe. There is no CT evidence of acute cortical infarction. Mild periventricular white matter disease appears unchanged. Vascular: Intracranial vascular calcifications noted. Skull: Negative for fracture or focal lesion. Sinuses/Orbits: The visualized paranasal sinuses and mastoid  air cells are clear. No orbital abnormalities are seen. Other: None. IMPRESSION: No acute intracranial findings. Stable atrophy and minimal periventricular white matter disease. Electronically Signed   By: Carey Bullocks M.D.   On: 11/20/2015 13:19   US Abdomen Limited Ruq  Result Date: 11/20/2015 CLINICAL DATA:  Hyperbilirubinemia. EXAM: US ABDOMEN LIMITED - RIGHT UPPER QUADRANT COMPARISON:  None. FINDINGS: Gallbladder: No gallstones or wall thickening visualized. No sonographic Murphy sign noted by sonographer. Common bile duct: Diameter: 3.7 mm Liver: No focal lesion identified. Within normal limits in parenchymal echogenicity. Moderate right pleural effusion. IMPRESSION: Negative for gallstones.  No biliary obstruction Right pleural effusion Electronically Signed   By: Marlan Palau M.D.   On: 11/20/2015 18:14   Scheduled Meds: . aspirin EC  81 mg Oral QODAY  . carvedilol  12.5 mg Oral BID  . enoxaparin (LOVENOX) injection  30 mg Subcutaneous Q24H  . escitalopram  20 mg Oral Daily  . furosemide  40 mg Intravenous BID  . levothyroxine  75 mcg Oral QAC breakfast  . pantoprazole  40 mg Oral Daily  . sodium chloride flush  3 mL Intravenous Q12H   Continuous Infusions:   Active Problems:   Depression   Essential hypertension   Hyperlipidemia   Memory loss   IPF (idiopathic pulmonary fibrosis) (HCC)   Hypothyroidism   Fatigue   PAF (paroxysmal atrial fibrillation) (HCC)   Urinary incontinence   Pulmonary edema   Acute on chronic systolic CHF (congestive heart failure) (HCC)   Hyperbilirubinemia  Time spent:   Standley Dakins, MD, FAAFP [facility] Pager [phone]  If 7PM-7AM, please contact night-coverage [email or web address] Password [email or web address] 11/21/2015, 9:24 AM    LOS: 1 day

## 2015-11-21 NOTE — Progress Notes (Signed)
BP 85/64 - pt sitting up in bed eating breakfast, verbal, no complaints, appropriate. AM dose of Lasix and Coreg held due to blood pressure - Dr. Laural BenesJohnson notified.

## 2015-11-21 NOTE — Progress Notes (Signed)
PT Cancellation Note  Patient Details Name: [name] [name] MRN: [id number] DOB: 08/13/1930   Cancelled Treatment:     Chart reviewed. Noted patient [facility]. Pt kindly refused evaluation at this time, however would like to see us in the morning. We will return tomorrow.    Marella BileBRITT, Terriann Difonzo 11/21/2015, 5:26 PM  Marella BileSharron Benjamin Merrihew, PT Pager: [phone] 11/21/2015

## 2015-11-21 NOTE — Consult Note (Signed)
CARDIOLOGY CONSULT NOTE  Patient ID: [name] [name] MRN: [id number] DOB/AGE: 1930/08/09 82 y.o.  Admit date: 11/20/2015 Primary Physician Nelwyn Salisbury, MD Primary Cardiologist Dr. Anne Fu Chief Complaint   Failure to thrive and weakness.  Requesting  Dr. Randol Kern  HPI:  The patient has a history of systolic HF with an EF of 25 -40%.  She also has permanent atrial fib.    She presented with complaints of generalized weakness.  She has not been taking Lasix over the past 3 weeks secondary to incontinence and inconvenience according to the notes.  She is pleasantly confused.  She does know that she is in the hospital and remembered eventually that she lived in a retirement home. She does not endorse any complaints however,  The patient denies any new symptoms such as chest discomfort, neck or arm discomfort. There has been no new shortness of breath, PND or orthopnea. There have been no reported palpitations, presyncope or syncope .   In the ED her BNP was elevated as below and she has a very mild troponin increase.   We are called to help with these issues.    Past Medical History:  Diagnosis Date  . Anemia   . Anxiety   . Arthritis   . Dementia   . Depression   . Diverticulosis of colon   . History of alcohol abuse   . Hyperlipidemia   . Hypertension   . IPF (idiopathic pulmonary fibrosis) (HCC)   . Irritable bowel syndrome   . OSA (obstructive sleep apnea)   . Osteoporosis   . PMR (polymyalgia rheumatica) (HCC)   . Postherpetic neuralgia   . Spinal stenosis   . Thyroid disease   . Vertebral fracture   . Vitamin D deficiency     Past Surgical History:  Procedure Laterality Date  . APPENDECTOMY    . BREAST BIOPSY    . CATARACT EXTRACTION    . DILATION AND CURETTAGE OF UTERUS     x several  . KNEE ARTHROSCOPY  10/2007 and 10/2010   left  . REPLACEMENT TOTAL KNEE  august 2012  . ROTATOR CUFF REPAIR    . VERTEBROPLASTY     x 3    Allergies  Allergen Reactions  .  Celecoxib     REACTION: unspecified  . Clindamycin Hcl Diarrhea  . Rofecoxib     REACTION: unspecified  . Sertraline Hcl     REACTION: unspecified   Prescriptions Prior to Admission  Medication Sig Dispense Refill Last Dose  . aspirin EC 81 MG tablet Take one tablet every other day (Patient taking differently: Take 81 mg by mouth daily. ) 1 tablet 0 11/19/2015 at Unknown time  . escitalopram (LEXAPRO) 20 MG tablet Take 20 mg by mouth daily.   11/19/2015 at Unknown time  . ferrous sulfate 324 (65 FE) MG TBEC Take 1 tablet (325 mg total) by mouth 2 (two) times daily. (Patient taking differently: Take 1 tablet by mouth at bedtime. ) 60 tablet 11 11/19/2015 at Unknown time  . levothyroxine (SYNTHROID, LEVOTHROID) 75 MCG tablet Take 75 mcg by mouth daily before breakfast.   11/20/2015 at Unknown time  . metoprolol tartrate (LOPRESSOR) 25 MG tablet Take 25 mg by mouth daily with breakfast.    11/20/2015 at 1330  . pantoprazole (PROTONIX) 40 MG tablet Take 1 tablet (40 mg total) by mouth daily. (Patient taking differently: Take 40 mg by mouth daily with breakfast. ) 90 tablet 3 11/19/2015 at Unknown time  .  carvedilol (COREG) 6.25 MG tablet Take 1 tablet (6.25 mg total) by mouth 2 (two) times daily. (Patient not taking: Reported on 11/20/2015) 180 tablet 3 Not Taking at Unknown time  . clobetasol ointment (TEMOVATE) 0.05 % Apply 1 application topically 2 (two) times daily. 60 g 5 unknown at unknown time  . traMADol (ULTRAM) 50 MG tablet Take 50 mg by mouth every 6 (six) hours as needed for moderate pain.   uknown  . Vitamin D, Ergocalciferol, (DRISDOL) 50000 units CAPS capsule TAKE 1 CAPSULE ONCE A WEEK. 4 capsule 5 11/15/2015 at unknown time   Family History  Problem Relation Age of Onset  . Stroke Father 6  . Heart attack Father   . Coronary artery disease Father 53  . Alcohol abuse Mother   . Breast cancer Sister   . Heart disease Sister   . Colon cancer Neg Hx     Social History    Social History  . Marital status: Married    Spouse name: N/A  . Number of children: N/A  . Years of education: N/A   Occupational History  . Not on file.   Social History Main Topics  . Smoking status: Former Smoker    Packs/day: 1.00    Years: 20.00    Types: Cigarettes    Quit date: 01/25/1988  . Smokeless tobacco: Never Used  . Alcohol use 0.0 oz/week     Comment: 2 glasses every day  . Drug use: No  . Sexual activity: Not on file   Other Topics Concern  . Not on file   Social History Narrative  . No narrative on file     ROS:    As stated in the HPI and negative for all other systems.  (The patient has dementia so the ROS is compromised.)  Physical Exam: Blood pressure (!) 86/68, pulse (!) 108, temperature 98.2 F (36.8 C), temperature source Oral, resp. rate 20, height  (1.6 m), weight 139 lb 5.3 oz (63.2 kg), SpO2 98 %. GENERAL:  Frail appearing HEENT:  Pupils equal round and reactive, fundi not visualized, oral mucosa unremarkable NECK:  No jugular venous distention, waveform within normal limits, carotid upstroke brisk and symmetric, no bruits, no thyromegaly LYMPHATICS:  No cervical, inguinal adenopathy LUNGS:  Clear to auscultation bilaterally BACK:  No CVA tenderness CHEST:  Unremarkable HEART:  PMI not displaced or sustained,S1 and S2 within normal limits, no S3,  no clicks, no rubs, no murmurs, irregular ABD:  Flat, positive bowel sounds normal in frequency in pitch, no bruits, no rebound, no guarding, no midline pulsatile mass, no hepatomegaly, no splenomegaly EXT:  2 plus pulses throughout, mild edema, no cyanosis no clubbing SKIN:  No rashes no nodules, bruising and healed scratches on both shoulders. NEURO:  Cranial nerves II through XII grossly intact, motor grossly intact throughout PSYCH:  Cognitively cofused, oriented to person and place    Labs: Lab Results  Component Value Date   BUN 17 11/20/2015   Lab Results  Component Value Date    CREATININE 0.99 11/20/2015   Lab Results  Component Value Date   NA 141 11/20/2015   K 4.4 11/20/2015   CL 105 11/20/2015   CO2 27 11/20/2015   Lab Results  Component Value Date   TROPONINI 0.06 (HH) 11/21/2015   Lab Results  Component Value Date   WBC 5.2 11/21/2015   HGB 14.7 11/21/2015   HCT 43.7 11/21/2015   MCV 110.9 (H) 11/21/2015   PLT  94 (L) 11/21/2015   Lab Results  Component Value Date   CHOL 206 (H) 03/08/2010   HDL 74.80 03/08/2010   LDLDIRECT 124.9 03/08/2010   TRIG 78.0 03/08/2010   CHOLHDL 3 03/08/2010   Lab Results  Component Value Date   ALT 45 11/20/2015   AST 53 (H) 11/20/2015   ALKPHOS 71 11/20/2015   BILITOT 3.1 (H) 11/20/2015      Radiology:   CXR: Cardiac silhouette is mildly enlarged. No mediastinal or hilar masses. There are irregularly thickened interstitial markings, increased when compared to the prior chest radiograph. There also small bilateral pleural effusions and thickening along the fissures.   EKG:  Atrial fib, rate 118, RAD, incomplete RBBB, QT prolonged, no acute ST T wave changes.  11/21/2015  ASSESSMENT AND PLAN:   ACUTE ON CHRONIC SYSTOLIC AND DIASTOLIC HF:    Agree with current gentle diuresis.  She will need strict I/Os.  I don't suspect that she will need significant in patient therapy specifically for this.    ATRIAL FIB:  Ms. Kirstie MirzaGloria G Sferrazza has a CHA2DS2 - VASc score of 5 with a risk of stroke of 6.7%.  However, there has been discussion with her out patient providers not to use anticoagulation secondary to bruising, epistaxis, skin tears.  Rate is somewhat elevated.  However, BP is soft and will not allow med titration.   ELEVATED TROPONIN:  Demand ischemia or non specific troponin elevation.  No change in therapy or ischemia elevation is indicated for this.    SignedRollene Rotunda: Darrnell Mangiaracina 11/21/2015, 10:08 AM

## 2015-11-21 NOTE — Progress Notes (Signed)
Preliminary results by tech - Right Venous Duplex Lower Ext. Completed. Negative for deep and superficial vein thrombosis in the right leg.  Marilynne Halstedita Elexius Minar, BS, RDMS, RVT

## 2015-11-22 DIAGNOSIS — R413 Other amnesia: Secondary | ICD-10-CM

## 2015-11-22 DIAGNOSIS — R5383 Other fatigue: Secondary | ICD-10-CM

## 2015-11-22 LAB — CBC
HEMATOCRIT: 41.8 % (ref 36.0–46.0)
HEMOGLOBIN: 13.9 g/dL (ref 12.0–15.0)
MCH: 36.7 pg — ABNORMAL HIGH (ref 26.0–34.0)
MCHC: 33.3 g/dL (ref 30.0–36.0)
MCV: 110.3 fL — ABNORMAL HIGH (ref 78.0–100.0)
Platelets: 98 10*3/uL — ABNORMAL LOW (ref 150–400)
RBC: 3.79 MIL/uL — ABNORMAL LOW (ref 3.87–5.11)
RDW: 18.9 % — ABNORMAL HIGH (ref 11.5–15.5)
WBC: 4.5 10*3/uL (ref 4.0–10.5)

## 2015-11-22 LAB — BASIC METABOLIC PANEL
ANION GAP: 6 (ref 5–15)
BUN: 18 mg/dL (ref 6–20)
CHLORIDE: 102 mmol/L (ref 101–111)
CO2: 32 mmol/L (ref 22–32)
Calcium: 8.7 mg/dL — ABNORMAL LOW (ref 8.9–10.3)
Creatinine, Ser: 0.97 mg/dL (ref 0.44–1.00)
GFR calc Af Amer: >60 mL/min (ref >60)
GFR, EST NON AFRICAN AMERICAN: 53 mL/min — AB (ref >60)
GLUCOSE: 76 mg/dL (ref 65–99)
POTASSIUM: 3.2 mmol/L — AB (ref 3.5–5.1)
Sodium: 140 mmol/L (ref 135–145)

## 2015-11-22 MED ORDER — POTASSIUM CHLORIDE CRYS ER 20 MEQ PO TBCR
20.0000 meq | EXTENDED_RELEASE_TABLET | Freq: Three times a day (TID) | ORAL | Status: DC
  Administered 2015-11-22 – 2015-11-24 (×6): 20 meq via ORAL
  Filled 2015-11-22 (×6): qty 1

## 2015-11-22 MED ORDER — POTASSIUM CHLORIDE CRYS ER 20 MEQ PO TBCR
40.0000 meq | EXTENDED_RELEASE_TABLET | Freq: Once | ORAL | Status: AC
  Administered 2015-11-22: 40 meq via ORAL
  Filled 2015-11-22: qty 2

## 2015-11-22 MED ORDER — DILTIAZEM HCL 25 MG/5ML IV SOLN
10.0000 mg | Freq: Once | INTRAVENOUS | Status: AC
  Administered 2015-11-23: 10 mg via INTRAVENOUS
  Filled 2015-11-22 (×2): qty 5

## 2015-11-22 NOTE — Progress Notes (Signed)
11/22/2015 6:49 PM  Pt developed Afib RVR, HR sustaining 120s, SBP holding 100-120, ordered diltiazem 10 mg IV x 1 dose, recheck, asked RN to notify cardiology team and call [facility] on call if HR not improving.    [name] [name]. Rashod Gougeon, MD

## 2015-11-22 NOTE — Evaluation (Addendum)
Physical Therapy Evaluation Patient Details Name: Kirstie MirzaGloria G Lucking MRN: [id number] DOB: 07/29/1930 Today's Date: 11/22/2015   History of Present Illness  82 yo female admitted with pulmonary edema. Hx of HF, A fib, osteoporosis, dementia  Clinical Impression  On eval, pt required Min-Mod assist for mobility. Pt walked ~10 feet with a RW. MAX encouragement required for all activity. Pt is fearful when mobilizing. Discussed d/c plan with daughters-plan is for pt to return to [facility] with caregivers assisting as before. Pt appears to be requiring increased assistance compared to baseline. Recommend 24 hour care at d/c for safety. If 24 hour care not available, [facility].    Follow Up Recommendations Home health PT;Supervision/Assistance - 24 hour (really needs to consider higher level of care)    Equipment Recommendations  None recommended by PT    Recommendations for Other Services OT consult     Precautions / Restrictions Precautions Precautions: Fall Precaution Comments: monitor O2 sats Restrictions Weight Bearing Restrictions: No      Mobility  Bed Mobility Overal bed mobility: Needs Assistance Bed Mobility: Sit to Sidelying;Supine to Sit     Supine to sit: Min assist   Sit to sidelying: Min assist General bed mobility comments: assist for trunk and bil LEs. Increased time.   Transfers Overall transfer level: Needs assistance Equipment used: Rolling walker (2 wheeled) Transfers: Sit to/from Stand Sit to Stand: Mod assist         General transfer comment: Mod assist to rise from bed, Min [facility]. Multimodal cues for safety, technique, hand placement.   Ambulation/Gait Ambulation/Gait assistance: Min assist Ambulation Distance (Feet): 10 Feet Assistive device: Rolling walker (2 wheeled) Gait Pattern/deviations: Step-through pattern;Trunk flexed;Decreased step length - right;Decreased step length -  left;Decreased stride length     General Gait Details: MAX encouragement for participation. Pt not willing to walk any farther.   Stairs            Wheelchair Mobility    Modified Rankin (Stroke Patients Only)       Balance Overall balance assessment: Needs assistance           Standing balance-Leahy Scale: Poor Standing balance comment: requires walker                             Pertinent Vitals/Pain Pain Assessment: No/denies pain    Home Living Family/patient expects to be discharged to:: Private residence Living Arrangements: Spouse/significant other Available Help at Discharge: Personal care attendant (in and out; assist several times throughout the day) Type of Home: Independent living facility Home Access: Level entry     Home Layout: One level Home Equipment: Walker - 2 wheels      Prior Function Level of Independence: Needs assistance   Gait / Transfers Assistance Needed: cane vs RW. Family states they are trying to get her to switch over completely to using walker  ADL's / Homemaking Assistance Needed: aides assist with bathing/dressing        Hand Dominance        Extremity/Trunk Assessment   Upper Extremity Assessment: Generalized weakness           Lower Extremity Assessment: Generalized weakness      Cervical / Trunk Assessment: Kyphotic  Communication   Communication: No difficulties  Cognition Arousal/Alertness: Awake/alert Behavior During Therapy: WFL for tasks assessed/performed Overall Cognitive Status: History of cognitive  impairments - at baseline                      General Comments      Exercises     Assessment/Plan    PT Assessment Patient needs continued PT services  PT Problem List Decreased strength;Decreased mobility;Decreased activity tolerance;Decreased balance;Decreased knowledge of use of DME;Decreased cognition          PT Treatment Interventions DME  instruction;Therapeutic activities;Therapeutic exercise;Gait training;Balance training;Functional mobility training;Patient/family education    PT Goals (Current goals can be found in the Care Plan section)  Acute Rehab PT Goals Patient Stated Goal: home PT Goal Formulation: With patient/family Time For Goal Achievement: 12/06/15 Potential to Achieve Goals: Good    Frequency Min 3X/week   Barriers to discharge        Co-evaluation               End of Session Equipment Utilized During Treatment: Gait belt Activity Tolerance: Patient tolerated treatment well Patient left: in bed;with call bell/phone within reach;with family/visitor present;with bed alarm set           Time: [id number] PT Time Calculation (min) (ACUTE ONLY): 29 min   Charges:   PT Evaluation $PT Eval Low Complexity: 1 Procedure PT Treatments $Gait Training: 8-22 mins   PT G Codes:        Rebeca AlertJannie Alecxis Baltzell, MPT Pager: [phone]

## 2015-11-22 NOTE — Progress Notes (Signed)
PROGRESS NOTE    [name] [name]  ZOX:[id number]  DOB: Oct 20, 1930  DOA: 11/20/2015 PCP: Nelwyn Salisbury, MD Outpatient Specialists:   Hospital course: HPI:  The patient has a history of systolic HF with an EF of 25 -81%.  She also has permanent atrial fib.    She presented with complaints of generalized weakness.  She has not been taking Lasix over the past 3 weeks secondary to incontinence and inconvenience according to the notes.  She is pleasantly confused.  She does know that she is in the hospital and remembered eventually that she lived in a retirement home. She does not endorse any complaints however,  The patient denies any new symptoms such as chest discomfort, neck or arm discomfort. There has been no new shortness of breath, PND or orthopnea. There have been no reported palpitations, presyncope or syncope .   In the ED her BNP was elevated as below and she has a very mild troponin increase.   We are called to help with these issues.    Assessment & Plan:   Acute on chronic systolic CHF/cardiomyopathy - Most recent echo in 06/03/15, with EF 25-30%, with diffuse hypokinesis - Patient presents with lower extremity edema, evidence of volume overload on chest x-ray, elevated BNP, this all started after her PCP stopped her Lasix for urinary incontinence. - Patient admitted under CHF pathway, check daily weight, strict ins and outs, will start on Lasix 40 mg IV twice a day. - Patient apparently was taking both metoprolol and Coreg prior to admission.  DC metoprolol, continue Coreg for better heart rate control, patient not on a CE I/ARB secondary to soft blood pressure per cardiology note as an outpatient. - First troponin 0.04, this is most likely due to her acute CHF, trended troponins have been flat. - Patient with prolonged QTC, low magnesium will be treated IV supplement. Continue telemetry monitoring. - Cardiology consulted-see notes.  Hyperbilirubinemia - Denies any abdominal  pain,  right upper quadrant ultrasound reveals moderate right pleural effusion otherwise no significant findings.  Lower extremity edema right more than left - Most likely due to CHF, lower extremity venous Doppler neg for DVT.   Atrial fibrillation - Patient used to be on Eliquis, started by her PCP on 11/02/2015 secondary to easy bruising, recurrent epistaxis and skin bleed, and started her on aspirin 81 mg every other day, discussed this with the family, and they do understand the increased risk for acute CVA with holding Eliquis. - Continue Coreg as tolerated. Appreciate cardiology consultation.  Ideopathic pulmonary fibrosis - Followed by Dr. Kendrick Fries, seems to be stable  Generalized weakness and fatigue - This is most likely related to significant systolic CHF with EF 25-30% and the combination of 2 beta blockers which has been discontinued. -  PT eval pending.   Hypothyroid - cont with synthroid  GERD - Continue with PPI  DVT Prophylaxis Lovenox - SCDs  AM Labs Ordered, also please review Full Orders  Family Communication: Admission, patients condition and plan of care including tests being ordered have been discussed with the patient, husband anddaughter who indicate understanding and agree with the plan and Code Status.  Code Status Full  Dispo: SNF 1-2 days  Consultants:  cardiology  Subjective: Patient reports that she's feeling better.    Objective: Vitals:   11/21/15 1500 11/21/15 2100 11/22/15 0455 11/22/15 0629  BP: 116/75 120/78 112/61   Pulse: (!) 115 (!) 117 84   Resp: 19 20 18    Temp:  98.6 F (37 C) 98.6 F (37 C) 97.9 F (36.6 C)   TempSrc: Oral Oral Axillary   SpO2: 100% 100% 99%   Weight:    63 kg (138 lb 12.8 oz)  Height:        Intake/Output Summary (Last 24 hours) at 11/22/15 1157 Last data filed at 11/21/15 1818  Gross per 24 hour  Intake              530 ml  Output              700 ml  Net             -170 ml   Filed  Weights   11/20/15 1710 11/21/15 0504 11/22/15 0629  Weight: 64.5 kg (142 lb 4.8 oz) 63.2 kg (139 lb 5.3 oz) 63 kg (138 lb 12.8 oz)   Exam:  General exam: Elderly, chronically ill-appearing female in no apparent distress, cooperative and pleasant. Respiratory system:  No increased work of breathing diminished breath sounds right lower lobe. Cardiovascular system: S1 & S2 heard, irregularly irregular and tachycardic.  Gastrointestinal system: Abdomen is nondistended, soft and nontender. Normal bowel sounds heard. Central nervous system: Alert and oriented. No focal neurological deficits. Extremities: Diffuse upper and lower extremity edema.  Data Reviewed: Basic Metabolic Panel:  Recent Labs Lab 11/20/15 1120 11/21/15 0209 11/22/15 0444  NA 141  --  140  K 4.4  --  3.2*  CL 105  --  102  CO2 27  --  32  GLUCOSE 100*  --  76  BUN 17  --  18  CREATININE 0.99  --  0.97  CALCIUM 9.4  --  8.7*  MG  --  1.4*  --    Liver Function Tests:  Recent Labs Lab 11/20/15 1219  AST 53*  ALT 45  ALKPHOS 71  BILITOT 3.1*  PROT 7.5  ALBUMIN 3.6   No results for input(s): LIPASE, AMYLASE in the last 168 hours. No results for input(s): AMMONIA in the last 168 hours. CBC:  Recent Labs Lab 11/20/15 1120 11/21/15 0209 11/22/15 0444  WBC 4.6 5.2 4.5  HGB 16.0* 14.7 13.9  HCT 46.9* 43.7 41.8  MCV 110.4* 110.9* 110.3*  PLT 105* 94* 98*   Cardiac Enzymes:  Recent Labs Lab 11/20/15 1219 11/20/15 2003 11/21/15 0209  TROPONINI 0.04* 0.05* 0.06*   CBG (last 3)   Recent Labs  11/20/15 1137  GLUCAP 79   Recent Results (from the past 240 hour(s))  MRSA PCR Screening     Status: None   Collection Time: 11/20/15  7:07 PM  Result Value Ref Range Status   MRSA by PCR NEGATIVE NEGATIVE Final    Comment:        The GeneXpert MRSA Assay (FDA approved for NASAL specimens only), is one component of a comprehensive MRSA colonization surveillance program. It is not intended to  diagnose MRSA infection nor to guide or monitor treatment for MRSA infections.      Studies: Dg Chest 2 View  Result Date: 11/20/2015 CLINICAL DATA:  Per spouse patient has had increased weakness, lethargy, decreased appetite x several weeks. ; HTN; ex smoker EXAM: CHEST  2 VIEW COMPARISON:  Chest CT, 02/07/2014.  Chest radiograph, 12/27/2013. FINDINGS: Cardiac silhouette is mildly enlarged. No mediastinal or hilar masses. There are irregularly thickened interstitial markings, increased when compared to the prior chest radiograph. There also small bilateral pleural effusions and thickening along the fissures. No pneumothorax. Skeletal structures are  demineralized. There are compression fractures of the lower thoracic and lumbar spine several treated with previous vertebroplasty. These are stable. IMPRESSION: 1. Findings are consistent with congestive heart failure and interstitial edema superimposed on chronic interstitial lung disease. Electronically Signed   By: Amie Portlandavid  Ormond M.D.   On: 11/20/2015 13:28   Ct Head Wo Contrast  Result Date: 11/20/2015 CLINICAL DATA:  Increasing weakness and lethargy with decreased appetite for several weeks. Patient fell 3 days ago. EXAM: CT HEAD WITHOUT CONTRAST TECHNIQUE: Contiguous axial images were obtained from the base of the skull through the vertex without intravenous contrast. COMPARISON:  MRI brain 01/06/2011.  CT head 04/29/2004 FINDINGS: Brain: There is no evidence of acute intracranial hemorrhage, mass lesion, brain edema or extra-axial fluid collection. There is stable atrophy with prominence of the ventricles and subarachnoid spaces. Atrophy appears greatest within the right frontal lobe. There is no CT evidence of acute cortical infarction. Mild periventricular white matter disease appears unchanged. Vascular: Intracranial vascular calcifications noted. Skull: Negative for fracture or focal lesion. Sinuses/Orbits: The visualized paranasal sinuses and  mastoid air cells are clear. No orbital abnormalities are seen. Other: None. IMPRESSION: No acute intracranial findings. Stable atrophy and minimal periventricular white matter disease. Electronically Signed   By: Carey BullocksWilliam  Veazey M.D.   On: 11/20/2015 13:19   [place] Abdomen Limited Ruq  Result Date: 11/20/2015 CLINICAL DATA:  Hyperbilirubinemia. EXAM: US ABDOMEN LIMITED - RIGHT UPPER QUADRANT COMPARISON:  None. FINDINGS: Gallbladder: No gallstones or wall thickening visualized. No sonographic Murphy sign noted by sonographer. Common bile duct: Diameter: 3.7 mm Liver: No focal lesion identified. Within normal limits in parenchymal echogenicity. Moderate right pleural effusion. IMPRESSION: Negative for gallstones.  No biliary obstruction Right pleural effusion Electronically Signed   By: Marlan Palauharles  Clark M.D.   On: 11/20/2015 18:14   Scheduled Meds: . aspirin EC  81 mg Oral QODAY  . carvedilol  3.125 mg Oral BID  . enoxaparin (LOVENOX) injection  30 mg Subcutaneous Q24H  . escitalopram  20 mg Oral Daily  . furosemide  40 mg Intravenous BID  . levothyroxine  75 mcg Oral QAC breakfast  . pantoprazole  40 mg Oral Daily  . sodium chloride flush  3 mL Intravenous Q12H   Continuous Infusions:   Active Problems:   Depression   Essential hypertension   Hyperlipidemia   Memory loss   IPF (idiopathic pulmonary fibrosis) (HCC)   Hypothyroidism   Fatigue   PAF (paroxysmal atrial fibrillation) (HCC)   Urinary incontinence   Pulmonary edema   Acute on chronic systolic CHF (congestive heart failure) (HCC)   Hyperbilirubinemia  Time spent:   Standley Dakinslanford Jayvyn Haselton, MD, FAAFP [facility] Pager [phone]  If 7PM-7AM, please contact night-coverage [email or web address] Password [email or web address] 11/22/2015, 11:57 AM    LOS: 2 days

## 2015-11-22 NOTE — Progress Notes (Signed)
Patient's heart rate in the up in the 160, sustaining in 120s-130s BP 110/52, patient resting in bed denies any pain/distress, Notified Dr Laural BenesJohnson, Cardizem IV ordered and reported off to night shift nurse to F/U with plan of care.

## 2015-11-22 NOTE — Progress Notes (Signed)
Patient Name: [name] [name] Date of Encounter: 11/22/2015  Hospital Problem List     Active Problems:   Depression   Essential hypertension   Hyperlipidemia   Memory loss   IPF (idiopathic pulmonary fibrosis) (HCC)   Hypothyroidism   Fatigue   PAF (paroxysmal atrial fibrillation) (HCC)   Urinary incontinence   Pulmonary edema   Acute on chronic systolic CHF (congestive heart failure) (HCC)   Hyperbilirubinemia    Patient Profile     The patient has a history of systolic HF with an EF of 25 -98%30%.  She also has permanent atrial fib.  She presented with failure to thrive.  In the ED her BNP was elevated as below and she has a very mild troponin increase.   We are called to help with these issues.    Subjective   She is confused but denies any complaints.  No SOB.  No pain.   Inpatient Medications    . aspirin EC  81 mg Oral QODAY  . carvedilol  3.125 mg Oral BID  . enoxaparin (LOVENOX) injection  30 mg Subcutaneous Q24H  . escitalopram  20 mg Oral Daily  . furosemide  40 mg Intravenous BID  . levothyroxine  75 mcg Oral QAC breakfast  . pantoprazole  40 mg Oral Daily  . sodium chloride flush  3 mL Intravenous Q12H    Vital Signs    Vitals:   11/21/15 1500 11/21/15 2100 11/22/15 0455 11/22/15 0629  BP: 116/75 120/78 112/61   Pulse: (!) 115 (!) 117 84   Resp: 19 20 18    Temp: 98.6 F (37 C) 98.6 F (37 C) 97.9 F (36.6 C)   TempSrc: Oral Oral Axillary   SpO2: 100% 100% 99%   Weight:    138 lb 12.8 oz (63 kg)  Height:        Intake/Output Summary (Last 24 hours) at 11/22/15 1109 Last data filed at 11/21/15 1818  Gross per 24 hour  Intake              530 ml  Output              700 ml  Net             -170 ml   Filed Weights   11/20/15 1710 11/21/15 0504 11/22/15 0629  Weight: 142 lb 4.8 oz (64.5 kg) 139 lb 5.3 oz (63.2 kg) 138 lb 12.8 oz (63 kg)    Physical Exam    GEN: Well nourished, well developed, in no acute distress.  Neck: Supple, no JVD,  carotid bruits, or masses. Cardiac: Irregular, no rubs, or gallops. No clubbing, cyanosis, no edema.  Radials/DP/PT 2+ and equal bilaterally.  Respiratory:  Respirations  regular and unlabored, left greater than right basilar crackles GI: Soft, nontender, nondistended, BS + x 4. Neuro:  Weak and confused.    Labs    CBC  Recent Labs  11/21/15 0209 11/22/15 0444  WBC 5.2 4.5  HGB 14.7 13.9  HCT 43.7 41.8  MCV 110.9* 110.3*  PLT 94* 98*   Basic Metabolic Panel  Recent Labs  11/20/15 1120 11/21/15 0209 11/22/15 0444  NA 141  --  140  K 4.4  --  3.2*  CL 105  --  102  CO2 27  --  32  GLUCOSE 100*  --  76  BUN 17  --  18  CREATININE 0.99  --  0.97  CALCIUM 9.4  --  8.7*  MG  --  1.4*  --    Liver Function Tests  Recent Labs  11/20/15 1219  AST 53*  ALT 45  ALKPHOS 71  BILITOT 3.1*  PROT 7.5  ALBUMIN 3.6   No results for input(s): LIPASE, AMYLASE in the last 72 hours. Cardiac Enzymes  Recent Labs  11/20/15 1219 11/20/15 2003 11/21/15 0209  TROPONINI 0.04* 0.05* 0.06*   BNP Invalid input(s): POCBNP D-Dimer No results for input(s): DDIMER in the last 72 hours. Hemoglobin A1C No results for input(s): HGBA1C in the last 72 hours. Fasting Lipid Panel No results for input(s): CHOL, HDL, LDLCALC, TRIG, CHOLHDL, LDLDIRECT in the last 72 hours. Thyroid Function Tests  Recent Labs  11/20/15 1331  TSH 5.431*    ECG    NA  Radiology    Dg Chest 2 View  Result Date: 11/20/2015 CLINICAL DATA:  Per spouse patient has had increased weakness, lethargy, decreased appetite x several weeks. ; HTN; ex smoker EXAM: CHEST  2 VIEW COMPARISON:  Chest CT, 02/07/2014.  Chest radiograph, 12/27/2013. FINDINGS: Cardiac silhouette is mildly enlarged. No mediastinal or hilar masses. There are irregularly thickened interstitial markings, increased when compared to the prior chest radiograph. There also small bilateral pleural effusions and thickening along the fissures.  No pneumothorax. Skeletal structures are demineralized. There are compression fractures of the lower thoracic and lumbar spine several treated with previous vertebroplasty. These are stable. IMPRESSION: 1. Findings are consistent with congestive heart failure and interstitial edema superimposed on chronic interstitial lung disease. Electronically Signed   By: Amie Portland M.D.   On: 11/20/2015 13:28   Ct Head Wo Contrast  Result Date: 11/20/2015 CLINICAL DATA:  Increasing weakness and lethargy with decreased appetite for several weeks. Patient fell 3 days ago. EXAM: CT HEAD WITHOUT CONTRAST TECHNIQUE: Contiguous axial images were obtained from the base of the skull through the vertex without intravenous contrast. COMPARISON:  MRI brain 01/06/2011.  CT head 04/29/2004 FINDINGS: Brain: There is no evidence of acute intracranial hemorrhage, mass lesion, brain edema or extra-axial fluid collection. There is stable atrophy with prominence of the ventricles and subarachnoid spaces. Atrophy appears greatest within the right frontal lobe. There is no CT evidence of acute cortical infarction. Mild periventricular white matter disease appears unchanged. Vascular: Intracranial vascular calcifications noted. Skull: Negative for fracture or focal lesion. Sinuses/Orbits: The visualized paranasal sinuses and mastoid air cells are clear. No orbital abnormalities are seen. Other: None. IMPRESSION: No acute intracranial findings. Stable atrophy and minimal periventricular white matter disease. Electronically Signed   By: Carey Bullocks M.D.   On: 11/20/2015 13:19   US Abdomen Limited Ruq  Result Date: 11/20/2015 CLINICAL DATA:  Hyperbilirubinemia. EXAM: US ABDOMEN LIMITED - RIGHT UPPER QUADRANT COMPARISON:  None. FINDINGS: Gallbladder: No gallstones or wall thickening visualized. No sonographic Murphy sign noted by sonographer. Common bile duct: Diameter: 3.7 mm Liver: No focal lesion identified. Within normal limits in  parenchymal echogenicity. Moderate right pleural effusion. IMPRESSION: Negative for gallstones.  No biliary obstruction Right pleural effusion Electronically Signed   By: Marlan Palau M.D.   On: 11/20/2015 18:14    Assessment & Plan    ACUTE ON CHRONIC SYSTOLIC AND DIASTOLIC HF:    Agree with current gentle diuresis. I don't think that her I/Os are complete.  She still has bilateral basilar crackles.  Continue current diuresis and supplement potassium per primary team.   ATRIAL FIB:  [name] [name] has a CHA2DS2 - VASc score of  5 with a risk of stroke of 6.7%.  However, there has been discussion with her out patient providers not to use anticoagulation secondary to bruising, epistaxis, skin tears.  Rate is somewhat elevated.  However, BP is soft and will not allow med titration.   ELEVATED TROPONIN:  Demand ischemia or non specific troponin elevation.  No change in therapy or ischemia elevation is indicated for this.   Signed, Rollene RotundaJames Teon Hudnall, MD  11/22/2015, 11:09 AM

## 2015-11-23 LAB — BASIC METABOLIC PANEL
Anion gap: 10 (ref 5–15)
BUN: 17 mg/dL (ref 6–20)
CO2: 34 mmol/L — ABNORMAL HIGH (ref 22–32)
CREATININE: 0.99 mg/dL (ref 0.44–1.00)
Calcium: 9.2 mg/dL (ref 8.9–10.3)
Chloride: 97 mmol/L — ABNORMAL LOW (ref 101–111)
GFR, EST AFRICAN AMERICAN: 59 mL/min — AB (ref >60)
GFR, EST NON AFRICAN AMERICAN: 51 mL/min — AB (ref >60)
Glucose, Bld: 127 mg/dL — ABNORMAL HIGH (ref 65–99)
Potassium: 3.7 mmol/L (ref 3.5–5.1)
SODIUM: 141 mmol/L (ref 135–145)

## 2015-11-23 LAB — MAGNESIUM: MAGNESIUM: 1.5 mg/dL — AB (ref 1.7–2.4)

## 2015-11-23 MED ORDER — MAGNESIUM SULFATE 2 GM/50ML IV SOLN
2.0000 g | Freq: Once | INTRAVENOUS | Status: AC
  Administered 2015-11-23: 2 g via INTRAVENOUS
  Filled 2015-11-23: qty 50

## 2015-11-23 MED ORDER — FUROSEMIDE 10 MG/ML IJ SOLN
40.0000 mg | Freq: Every day | INTRAMUSCULAR | Status: DC
Start: 2015-11-24 — End: 2015-11-24
  Administered 2015-11-24: 40 mg via INTRAVENOUS
  Filled 2015-11-23: qty 4

## 2015-11-23 MED ORDER — APIXABAN 2.5 MG PO TABS
2.5000 mg | ORAL_TABLET | Freq: Two times a day (BID) | ORAL | Status: DC
  Administered 2015-11-23 – 2015-11-24 (×3): 2.5 mg via ORAL
  Filled 2015-11-23 (×3): qty 1

## 2015-11-23 MED ORDER — DIGOXIN 125 MCG PO TABS
0.1250 mg | ORAL_TABLET | Freq: Every day | ORAL | Status: DC
  Administered 2015-11-23 – 2015-11-24 (×2): 0.125 mg via ORAL
  Filled 2015-11-23 (×2): qty 1

## 2015-11-23 NOTE — Care Management Note (Signed)
Case Management Note  Patient Details  Name: Kirstie MirzaGloria G Lilja MRN: [id number] Date of Birth: 10/25/1930  Subjective/Objective: PT-recc SNF. CSW notified.Noted VT.                   Action/Plan:d/c SNF.   Expected Discharge Date:   (UNKNOWN)               Expected Discharge Plan:  Skilled Nursing Facility  In-House Referral:  Clinical Social Work  Discharge planning Services  CM Consult  Post Acute Care Choice:    Choice offered to:     DME Arranged:    DME Agency:     HH Arranged:    HH Agency:     Status of Service:  In process, will continue to follow  If discussed at Long Length of Stay Meetings, dates discussed:    Additional Comments:  Lanier ClamMahabir, Denis Carreon, RN 11/23/2015, 3:38 PM

## 2015-11-23 NOTE — Progress Notes (Signed)
Patient Name: [name] MirzaGloria G [name] Date of Encounter: 11/23/2015  Primary Cardiologist: Dr. Dewayne HatchSkains  Hospital Problem List     Active Problems:   Depression   Essential hypertension   Hyperlipidemia   Memory loss   IPF (idiopathic pulmonary fibrosis) (HCC)   Hypothyroidism   Fatigue   PAF (paroxysmal atrial fibrillation) (HCC)   Urinary incontinence   Pulmonary edema   Acute on chronic systolic CHF (congestive heart failure) (HCC)   Hyperbilirubinemia     Subjective   Feeling wonderful. No complaints.  Inpatient Medications    Scheduled Meds: . aspirin EC  81 mg Oral QODAY  . carvedilol  3.125 mg Oral BID  . enoxaparin (LOVENOX) injection  30 mg Subcutaneous Q24H  . escitalopram  20 mg Oral Daily  . furosemide  40 mg Intravenous BID  . levothyroxine  75 mcg Oral QAC breakfast  . magnesium sulfate 1 - 4 g bolus IVPB  2 g Intravenous Once  . pantoprazole  40 mg Oral Daily  . potassium chloride  20 mEq Oral TID  . sodium chloride flush  3 mL Intravenous Q12H   Continuous Infusions:   PRN Meds: acetaminophen **OR** acetaminophen, ondansetron **OR** ondansetron (ZOFRAN) IV, traMADol   Vital Signs    Vitals:   11/22/15 1830 11/22/15 2036 11/23/15 0500 11/23/15 0515  BP: (!) 110/51 96/78  (!) 91/55  Pulse:  (!) 40  65  Resp:  16  17  Temp:  98.3 F (36.8 C)  98.6 F (37 C)  TempSrc:  Oral  Oral  SpO2: 95% 91%  92%  Weight:   133 lb 4.8 oz (60.5 kg)   Height:        Intake/Output Summary (Last 24 hours) at 11/23/15 1058 Last data filed at 11/23/15 0600  Gross per 24 hour  Intake              420 ml  Output              750 ml  Net             -330 ml   Filed Weights   11/21/15 0504 11/22/15 0629 11/23/15 0500  Weight: 139 lb 5.3 oz (63.2 kg) 138 lb 12.8 oz (63 kg) 133 lb 4.8 oz (60.5 kg)    Physical Exam    GEN: Well nourished, well developed, in no acute distress. elederly and frail HEENT: Grossly normal.  Neck: Supple, no JVD, carotid bruits, or  masses. Cardiac: irreg irreg, no murmurs, rubs, or gallops. No clubbing, cyanosis, edema.  Radials/DP/PT 2+ and equal bilaterally.  Respiratory:  Respirations regular and unlabored, clear to auscultation bilaterally. Mild crackles as bases GI: Soft, nontender, nondistended, BS + x 4. MS: no deformity or atrophy. Skin: warm and dry, no rash. Neuro:  Strength and sensation are intact. Psych: AAOx3.  Normal affect.  Labs    CBC  Recent Labs  11/21/15 0209 11/22/15 0444  WBC 5.2 4.5  HGB 14.7 13.9  HCT 43.7 41.8  MCV 110.9* 110.3*  PLT 94* 98*   Basic Metabolic Panel  Recent Labs  11/21/15 0209 11/22/15 0444 11/23/15 0426  NA  --  140 141  K  --  3.2* 3.7  CL  --  102 97*  CO2  --  32 34*  GLUCOSE  --  76 127*  BUN  --  18 17  CREATININE  --  0.97 0.99  CALCIUM  --  8.7* 9.2  MG 1.4*  --  1.5*   Liver Function Tests  Recent Labs  11/20/15 1219  AST 53*  ALT 45  ALKPHOS 71  BILITOT 3.1*  PROT 7.5  ALBUMIN 3.6   No results for input(s): LIPASE, AMYLASE in the last 72 hours. Cardiac Enzymes  Recent Labs  11/20/15 1219 11/20/15 2003 11/21/15 0209  TROPONINI 0.04* 0.05* 0.06*   BNP Invalid input(s): POCBNP D-Dimer No results for input(s): DDIMER in the last 72 hours. Hemoglobin A1C No results for input(s): HGBA1C in the last 72 hours. Fasting Lipid Panel No results for input(s): CHOL, HDL, LDLCALC, TRIG, CHOLHDL, LDLDIRECT in the last 72 hours. Thyroid Function Tests  Recent Labs  11/20/15 1331  TSH 5.431*    Telemetry  atrial fibrillation HR low 100s but previously RVR - Personally Reviewed  ECG    afib with RVR HR 130 - Personally Reviewed  Radiology    No results found.  Cardiac Studies   2D ECHO 06/03/15 LV EF: 25% - 30% Study Conclusions - Left ventricle: The cavity size was normal. Wall thickness was  increased in a pattern of moderate LVH. Systolic function was  severely reduced. The estimated ejection fraction was in  the  range of 25% to 30%. Diffuse hypokinesis. The study is not  technically sufficient to allow evaluation of LV diastolic  function. - Aortic valve: Trileaflet; mildly calcified leaflets. There was no  stenosis. There was mild regurgitation. - Mitral valve: Calcified annulus. Mildly thickened leaflets .  There was mild regurgitation. - Left atrium: Severely dilated at 55 ml/m2. - Right ventricle: The cavity size was moderately dilated. Systolic  function is reduced. - Right atrium: Severely dilated at 28 cm2. - Tricuspid valve: There was moderate regurgitation. - Pulmonary arteries: PA peak pressure: 64 mm Hg (S). - Inferior vena cava: The vessel was normal in size. The  respirophasic diameter changes were in the normal range (>= 50%),  consistent with normal central venous pressure. - Pericardium, extracardiac: A trivial pericardial effusion was  identified posterior to the heart. Features were not consistent  with tamponade physiology. Impressions - Compared to a prior echo in 2015, the LVEF has reduced from  55-60% to 25-30% wtih global hypokinesis. Atrial fibrillation is  noted.  Patient Profile     [name] [name] is a 82 y.o. female with a history of permanent atrial fibrillation, chronic systolic CHF, HTN, HLD, IPF, and hypothyroidism who presented to [facility] on 11/20/15 with weakness, lethargy, decreased appetite x several weeks  Assessment & Plan    Acute on chronic combined S/D CHF: in the setting of not taking her diuretics. She had not been taking Lasix over the past 3 weeks secondary to incontinence and inconvenience according to the notes.  -- Newly diagnosed on most recent ECHO (05/2015) revealed EF to 25-30% with global HK. Possibly tachycardia mediated? -- Continue Coreg 3.125 mg BID. BP too soft to add ACE/ARB.  -- Currently on IV lasix 40mg  BID. Net neg but weight down 7 lbs (140--> 133). Creat stable. I/Os inaccurate 2/2 incontinence. She is feeling  a lot better, she does have some velcro like crackles at bases which could be related to IPF. Consider stopping IV lasix and resuming home lasix 40mg  daily.   Permanent atrial fibrillation: CHA2DS2 VASc score of 5. She was initially placed on Eliquis 2.5 mg BID but this was later discontinued 2/2 bruising, epistaxis, skin tears. Rate has not been well controlled but BP precludes increase in AVN blocking agents. We may need to  think about low dose digoxin (no amio given IPF). HR currently okay in low 100s.    Elevated troponin: low and flat c/w demand ischemia   Hypothyroidism: TSH 5.4 but T4 elevated at 1.75. Per IM  Idiopathic pulmonary fibrosis: followed by Dr. Kendrick FriesMcQuaid  Signed, Cline CrockKathryn Thompson, PA-C  11/23/2015, 10:58 AM  As above, patient seen and examined. She denies dyspnea or chest pain. She remains in permanent atrial fibrillation but her rate is elevated. Continue low-dose beta blocker. We cannot advance as her blood pressure is borderline. Add digoxin 0.125 mg daily. I would not add amiodarone given baseline lung disease. She would benefit from anticoagulation long-term. Discontinue aspirin and treat with apixaban 2.5 BID. Change Lasix to 40 mg daily today and to oral form tomorrow. Note she does have a cardiomyopathy that is felt possibly tachycardia mediated. We are adding digoxin as described above. I will not add an ACE inhibitor as blood pressure will not allow.  Olga MillersBrian Nitin Mckowen, MD

## 2015-11-23 NOTE — NC FL2 (Signed)
[place] MEDICAID FL2 LEVEL OF CARE SCREENING TOOL     IDENTIFICATION  Patient Name: [name] [name] [name] Birthdate: 01/13/1931 Sex: female Admission Date (Current Location): 11/20/2015  [facility] [place] [place]   Facility and Address:  [name],  [address], [place] [id number]      Provider Number: [id number]  Attending Physician Name and Address:  Cleora Fleetlanford L Johnson, MD  Relative Name and Phone Number:       Current Level of Care: Hospital Recommended Level of Care: Skilled Nursing Facility Prior Approval Number:    Date Approved/Denied:   PASRR Number:  [id number] [name])  Discharge Plan: SNF    Current Diagnoses: Patient Active Problem List   Diagnosis Date Noted  . Pulmonary edema 11/20/2015  . Acute on chronic systolic CHF (congestive heart failure) (HCC) 11/20/2015  . Hyperbilirubinemia 11/20/2015  . Easy bruising 11/10/2015  . Bilateral leg edema 10/23/2015  . Urinary incontinence 10/23/2015  . PAF (paroxysmal atrial fibrillation) (HCC) 05/27/2015  . Fatigue 06/03/2014  . Hypothyroidism 04/02/2014  . Absolute anemia 01/01/2014  . IPF (idiopathic pulmonary fibrosis) (HCC) 01/01/2014  . OA (osteoarthritis) of knee 07/02/2010  . Memory loss 07/02/2010  . Hyperlipidemia 03/08/2010  . Vitamin D deficiency 10/05/2009  . Obstructive sleep apnea 10/08/2007  . SYNDROME, CHRONIC PAIN 11/16/2006  . DIVERTICULOSIS, COLON 06/06/2006  . Depression 04/30/2006  . Essential hypertension 04/30/2006  . OSTEOPOROSIS 04/30/2006    Orientation RESPIRATION BLADDER Height & Weight     Self, Time, Situation, Place  O2 (at 2L) Continent Weight: 133 lb 4.8 oz (60.5 kg) Height:  5\' 3"  (160 cm)  BEHAVIORAL SYMPTOMS/MOOD NEUROLOGICAL BOWEL NUTRITION STATUS   (NONE )  (NONE ) Continent Diet (2 grams sodium )  AMBULATORY STATUS COMMUNICATION OF NEEDS Skin   Extensive Assist Verbally Normal                       Personal Care Assistance Level of  Assistance  Bathing, Dressing, Feeding Bathing Assistance: Limited assistance Feeding assistance: Limited assistance Dressing Assistance: Limited assistance     Functional Limitations Info  Speech, Hearing, Sight Sight Info: Adequate Hearing Info: Adequate Speech Info: Adequate    SPECIAL CARE FACTORS FREQUENCY  PT (By licensed PT), OT (By licensed OT)     PT Frequency: 3              Contractures      Additional Factors Info  Allergies, Code Status Code Status Info: FULL CODE  Allergies Info:  (Celecoxib, Clindamycin Hcl, Rofecoxib, Sertraline Hcl)           Current Medications (11/23/2015):  This is the current hospital active medication list Current Facility-Administered Medications  Medication Dose Route Frequency Provider Last Rate Last Dose  . acetaminophen (TYLENOL) tablet 650 mg  650 mg Oral Q6H PRN Starleen Armsawood S Elgergawy, MD       Or  . acetaminophen (TYLENOL) suppository 650 mg  650 mg Rectal Q6H PRN Starleen Armsawood S Elgergawy, MD      . apixaban (ELIQUIS) tablet 2.5 mg  2.5 mg Oral BID Lewayne BuntingBrian S Crenshaw, MD   2.5 mg at 11/23/15 1403  . carvedilol (COREG) tablet 3.125 mg  3.125 mg Oral BID [place] Cyndie MullL Johnson, MD   3.125 mg at 11/23/15 1039  . digoxin (LANOXIN) tablet 0.125 mg  0.125 mg Oral Daily Lewayne BuntingBrian S Crenshaw, MD   0.125 mg at 11/23/15 1402  . escitalopram (LEXAPRO) tablet 20 mg  20  mg Oral Daily Starleen Armsawood S Elgergawy, MD   20 mg at 11/23/15 1042  . [START ON 11/24/2015] furosemide (LASIX) injection 40 mg  40 mg Intravenous Daily Lewayne BuntingBrian S Crenshaw, MD      . levothyroxine (SYNTHROID, LEVOTHROID) tablet 75 mcg  75 mcg Oral QAC breakfast Starleen Armsawood S Elgergawy, MD   75 mcg at 11/23/15 [id number]  . ondansetron (ZOFRAN) tablet 4 mg  4 mg Oral Q6H PRN Starleen Armsawood S Elgergawy, MD       Or  . ondansetron (ZOFRAN) injection 4 mg  4 mg Intravenous Q6H PRN Leana Roeawood S Elgergawy, MD      . pantoprazole (PROTONIX) EC tablet 40 mg  40 mg Oral Daily Starleen Armsawood S Elgergawy, MD   40 mg at 11/23/15 1042  .  potassium chloride SA (K-DUR,KLOR-CON) CR tablet 20 mEq  20 mEq Oral TID [place] Cyndie MullL Johnson, MD   20 mEq at 11/23/15 1044  . sodium chloride flush (NS) 0.9 % injection 3 mL  3 mL Intravenous Q12H Leana Roeawood S Elgergawy, MD   3 mL at 11/23/15 1000  . traMADol (ULTRAM) tablet 50 mg  50 mg Oral Q6H PRN Starleen Armsawood S Elgergawy, MD         Discharge Medications: Please see discharge summary for [name] list of discharge medications.  Relevant Imaging Results:  Relevant Lab Results:   Additional Information SSN [id number]  [name] [name] [name], [name] [name] [name]

## 2015-11-23 NOTE — Progress Notes (Signed)
Pt just had a 7 beat run of v-tach.  Pt was Asymptomatic.  MD made aware.  Will continue to monitor closely.

## 2015-11-23 NOTE — Progress Notes (Signed)
PROGRESS NOTE    [name] MirzaGloria G Gatley  ZOX:[id number]  DOB: 01/15/1931  DOA: 11/20/2015 PCP: Nelwyn SalisburyFRY,STEPHEN A, MD Outpatient Specialists:   Hospital course: HPI:  The patient has a history of systolic HF with an EF of 25 -81%30%.  She also has permanent atrial fib.    She presented with complaints of generalized weakness.  She has not been taking Lasix over the past 3 weeks secondary to incontinence and inconvenience according to the notes.  She is pleasantly confused.  She does know that she is in the hospital and remembered eventually that she lived in a retirement home. She does not endorse any complaints however,  The patient denies any new symptoms such as chest discomfort, neck or arm discomfort. There has been no new shortness of breath, PND or orthopnea. There have been no reported palpitations, presyncope or syncope .   In the ED her BNP was elevated as below and she has a very mild troponin increase.   We are called to help with these issues.    Assessment & Plan:   Acute on chronic systolic CHF/cardiomyopathy - Most recent echo in 06/03/15, with EF 25-30%, with diffuse hypokinesis - Patient presents with lower extremity edema, evidence of volume overload on chest x-ray, elevated BNP, this all started after her PCP stopped her Lasix for urinary incontinence. - Patient admitted under CHF pathway, check daily weight, strict ins and outs, will start on Lasix 40 mg IV twice a day. - Patient apparently was taking both metoprolol and Coreg prior to admission.  DC metoprolol, continue Coreg for better heart rate control, patient not on a CE I/ARB secondary to soft blood pressure per cardiology note as an outpatient. - First troponin 0.04, this is most likely due to her acute CHF, trended troponins have been flat. - Patient with prolonged QTC, low magnesium will be treated IV supplement. Continue telemetry monitoring. - Cardiology consulted-see notes.  Hyperbilirubinemia - Denies any abdominal  pain,  right upper quadrant ultrasound reveals moderate right pleural effusion otherwise no significant findings.  Lower extremity edema right more than left - Most likely due to CHF, lower extremity venous Doppler neg for DVT.   Atrial fibrillation - Patient used to be on Eliquis, started by her PCP on 11/02/2015 secondary to easy bruising, recurrent epistaxis and skin bleed, and started her on aspirin 81 mg every other day, discussed this with the family, and they do understand the increased risk for acute CVA with holding Eliquis. - Continue Coreg as tolerated.  - Pt had an episode of Afib with RVR 10/29 - given diltizem 10 mg IV x 1, consider increasing coreg to 6.25 mg, as dose was reduced from 25 mg bid on admission because of soft BPs.  - Appreciate cardiology consultation.  Ideopathic pulmonary fibrosis - Followed by Dr. Kendrick FriesMcQuaid, seems to be stable  Generalized weakness and fatigue - This is most likely related to significant systolic CHF with EF 25-30% and the combination of 2 beta blockers which has been discontinued. -  PT eval reviewed, concerning that patient may need higher level of care for short term before going back to ALF.  Asked CM and CSW to investigate and prepare disposition.   Hypothyroid - cont with synthroid  GERD - Continue with PPI  DVT Prophylaxis Lovenox - SCDs  AM Labs Ordered, also please review Full Orders  Family Communication: Admission, patients condition and plan of care including tests being ordered have been discussed with the patient, husband anddaughter who  indicate understanding and agree with the plan and Code Status.  Code Status Full  Dispo: SNF 1-2 days  Consultants:  cardiology  Subjective: Patient reports that she's feeling better.    Objective: Vitals:   11/22/15 1830 11/22/15 2036 11/23/15 0500 11/23/15 0515  BP: (!) 110/51 96/78  (!) 91/55  Pulse:  (!) 40  65  Resp:  16  17  Temp:  98.3 F (36.8 C)  98.6 F  (37 C)  TempSrc:  Oral  Oral  SpO2: 95% 91%  92%  Weight:   60.5 kg (133 lb 4.8 oz)   Height:        Intake/Output Summary (Last 24 hours) at 11/23/15 1045 Last data filed at 11/23/15 0600  Gross per 24 hour  Intake              420 ml  Output              750 ml  Net             -330 ml   Filed Weights   11/21/15 0504 11/22/15 0629 11/23/15 0500  Weight: 63.2 kg (139 lb 5.3 oz) 63 kg (138 lb 12.8 oz) 60.5 kg (133 lb 4.8 oz)   Exam:  General exam: Elderly, chronically ill-appearing female in no apparent distress, cooperative and pleasant. Respiratory system:  No increased work of breathing diminished breath sounds right lower lobe. Cardiovascular system: S1 & S2 heard, irregularly irregular and tachycardic.  Gastrointestinal system: Abdomen is nondistended, soft and nontender. Normal bowel sounds heard. Central nervous system: Alert and oriented. No focal neurological deficits. Extremities: Diffuse upper and lower extremity edema.  Data Reviewed: Basic Metabolic Panel:  Recent Labs Lab 11/20/15 1120 11/21/15 0209 11/22/15 0444 11/23/15 0426  NA 141  --  140 141  K 4.4  --  3.2* 3.7  CL 105  --  102 97*  CO2 27  --  32 34*  GLUCOSE 100*  --  76 127*  BUN 17  --  18 17  CREATININE 0.99  --  0.97 0.99  CALCIUM 9.4  --  8.7* 9.2  MG  --  1.4*  --  1.5*   Liver Function Tests:  Recent Labs Lab 11/20/15 1219  AST 53*  ALT 45  ALKPHOS 71  BILITOT 3.1*  PROT 7.5  ALBUMIN 3.6   No results for input(s): LIPASE, AMYLASE in the last 168 hours. No results for input(s): AMMONIA in the last 168 hours. CBC:  Recent Labs Lab 11/20/15 1120 11/21/15 0209 11/22/15 0444  WBC 4.6 5.2 4.5  HGB 16.0* 14.7 13.9  HCT 46.9* 43.7 41.8  MCV 110.4* 110.9* 110.3*  PLT 105* 94* 98*   Cardiac Enzymes:  Recent Labs Lab 11/20/15 1219 11/20/15 2003 11/21/15 0209  TROPONINI 0.04* 0.05* 0.06*   CBG (last 3)   Recent Labs  11/20/15 1137  GLUCAP 79   Recent Results  (from the past 240 hour(s))  MRSA PCR Screening     Status: None   Collection Time: 11/20/15  7:07 PM  Result Value Ref Range Status   MRSA by PCR NEGATIVE NEGATIVE Final    Comment:        The GeneXpert MRSA Assay (FDA approved for NASAL specimens only), is one component of a comprehensive MRSA colonization surveillance program. It is not intended to diagnose MRSA infection nor to guide or monitor treatment for MRSA infections.     Studies: No results found. Scheduled Meds: . aspirin  EC  81 mg Oral QODAY  . carvedilol  3.125 mg Oral BID  . enoxaparin (LOVENOX) injection  30 mg Subcutaneous Q24H  . escitalopram  20 mg Oral Daily  . furosemide  40 mg Intravenous BID  . levothyroxine  75 mcg Oral QAC breakfast  . magnesium sulfate 1 - 4 g bolus IVPB  2 g Intravenous Once  . pantoprazole  40 mg Oral Daily  . potassium chloride  20 mEq Oral TID  . sodium chloride flush  3 mL Intravenous Q12H   Continuous Infusions:   Active Problems:   Depression   Essential hypertension   Hyperlipidemia   Memory loss   IPF (idiopathic pulmonary fibrosis) (HCC)   Hypothyroidism   Fatigue   PAF (paroxysmal atrial fibrillation) (HCC)   Urinary incontinence   Pulmonary edema   Acute on chronic systolic CHF (congestive heart failure) (HCC)   Hyperbilirubinemia  Time spent:   Standley Dakins, MD, FAAFP [facility] Pager [phone]  If 7PM-7AM, please contact night-coverage [email or web address] Password [email or web address] 11/23/2015, 10:45 AM    LOS: 3 days

## 2015-11-23 NOTE — Progress Notes (Signed)
Physical Therapy Treatment Patient Details Name: [name] [name] MRN: [id number] DOB: 04/17/1930 Today's Date: 11/23/2015    History of Present Illness 82 yo female admitted with pulmonary edema. Hx of HF, A fib, osteoporosis, dementia    PT Comments    Pt required slightly more assistance on today. She remains fearful of falling which limits her mobility. Max encouragement and cueing required. Discussed d/c plan with family again on today. Family states pt will not have 24 hour care available. Pt is not safe to d/c back to [facility] without 24 hour care. Recommend ST rehab at SNF to maximize independence and safety with functional mobility before returning home.    Follow Up Recommendations  SNF     Equipment Recommendations       Recommendations for Other Services OT consult     Precautions / Restrictions Precautions Precautions: Fall Precaution Comments: monitor O2 sats Restrictions Weight Bearing Restrictions: No    Mobility  Bed Mobility Overal bed mobility: Needs Assistance Bed Mobility: Supine to Sit     Supine to sit: Min assist;HOB elevated     General bed mobility comments: assist for trunk and bil LEs. Increased time.   Transfers Overall transfer level: Needs assistance Equipment used: Rolling walker (2 wheeled) Transfers: Sit to/from Stand Sit to Stand: Mod assist         General transfer comment: Mod assist to rise from bed and commode on today. Multimodal cues for safety, technique, hand placement.   Ambulation/Gait   Ambulation Distance (Feet): 3 Feet Assistive device: Rolling walker (2 wheeled) Gait Pattern/deviations: Decreased stride length;Decreased step length - right;Decreased step length - left;Trunk flexed     General Gait Details: MAX encouragement for ambulation. Multimodal cues for posture, distance from walker, step length. Pt remains fearful of falling.    Stairs            Wheelchair Mobility    Modified Rankin  (Stroke Patients Only)       Balance Overall balance assessment: Needs assistance           Standing balance-Leahy Scale: Poor                      Cognition Arousal/Alertness: Awake/alert Behavior During Therapy: WFL for tasks assessed/performed Overall Cognitive Status: History of cognitive impairments - at baseline                      Exercises      General Comments        Pertinent Vitals/Pain Pain Assessment: No/denies pain    Home Living                      Prior Function            PT Goals (current goals can now be found in the care plan section) Progress towards PT goals: Progressing toward goals (slowly)    Frequency    Min 3X/week      PT Plan Discharge plan needs to be updated    Co-evaluation             End of Session Equipment Utilized During Treatment: Gait belt Activity Tolerance:  (Limited by anxiety/fear) Patient left: in chair;with call bell/phone within reach;with chair alarm set;with family/visitor present     Time: 1357-1430 PT Time Calculation (min) (ACUTE ONLY): 33 min  Charges:  $Therapeutic Activity: 23-37 mins  G Codes:      Weston Anna, MPT Pager: [phone]

## 2015-11-23 NOTE — Care Management Note (Signed)
Case Management Note  Patient Details  Name: Kirstie MirzaGloria G Vasques MRN: [id number] Date of Birth: 12/29/1930  Subjective/Objective: 82 y/o f admitted w/Pulmonary Edema. From [facility] liv-spoke to Susan-they have [facility] in bldg-tel#[phone],fax#[phone] if HHPT needed w/[id number].Await PT recc.CSW also following.                   Action/Plan:d/c plan may return to [facility]-may need no addiltional services/ HHC/SNF.  Expected Discharge Date:   (UNKNOWN)               Expected Discharge Plan:  Home w Home Health Services  In-House Referral:  Clinical Social Work  Discharge planning Services  CM Consult  Post Acute Care Choice:    Choice offered to:     DME Arranged:    DME Agency:     HH Arranged:    HH Agency:     Status of Service:  In process, will continue to follow  If discussed at Long Length of Stay Meetings, dates discussed:    Additional Comments:  Lanier ClamMahabir, Chamia Schmutz, RN 11/23/2015, 11:33 AM

## 2015-11-24 LAB — BASIC METABOLIC PANEL
ANION GAP: 9 (ref 5–15)
BUN: 14 mg/dL (ref 6–20)
CHLORIDE: 96 mmol/L — AB (ref 101–111)
CO2: 35 mmol/L — ABNORMAL HIGH (ref 22–32)
Calcium: 9 mg/dL (ref 8.9–10.3)
Creatinine, Ser: 0.87 mg/dL (ref 0.44–1.00)
GFR calc Af Amer: >60 mL/min (ref >60)
GFR, EST NON AFRICAN AMERICAN: 60 mL/min — AB (ref >60)
Glucose, Bld: 80 mg/dL (ref 65–99)
POTASSIUM: 3.9 mmol/L (ref 3.5–5.1)
SODIUM: 140 mmol/L (ref 135–145)

## 2015-11-24 MED ORDER — FUROSEMIDE 40 MG PO TABS: 40.0000 mg | ORAL_TABLET | Freq: Every day | ORAL | Status: DC

## 2015-11-24 MED ORDER — APIXABAN 2.5 MG PO TABS: 2.5000 mg | ORAL_TABLET | Freq: Two times a day (BID) | ORAL | Status: DC

## 2015-11-24 MED ORDER — TRAMADOL HCL 50 MG PO TABS: 50.0000 mg | ORAL_TABLET | Freq: Four times a day (QID) | ORAL | 0 refills | Status: DC | PRN

## 2015-11-24 MED ORDER — CARVEDILOL 3.125 MG PO TABS: 3.1250 mg | ORAL_TABLET | Freq: Two times a day (BID) | ORAL | Status: DC

## 2015-11-24 MED ORDER — FUROSEMIDE 40 MG PO TABS: 40.0000 mg | ORAL_TABLET | Freq: Every day | ORAL | Status: AC

## 2015-11-24 MED ORDER — POTASSIUM CHLORIDE CRYS ER 20 MEQ PO TBCR: 20.0000 meq | EXTENDED_RELEASE_TABLET | Freq: Two times a day (BID) | ORAL | Status: DC

## 2015-11-24 MED ORDER — DIGOXIN 125 MCG PO TABS: 0.1250 mg | ORAL_TABLET | Freq: Every day | ORAL | Status: DC

## 2015-11-24 NOTE — Care Management Note (Signed)
Case Management Note  Patient Details  Name: Kirstie MirzaGloria G Oberman MRN: [id number] Date of Birth: 05/30/1930  Subjective/Objective:                    Action/Plan:d/c SNF.   Expected Discharge Date:   (UNKNOWN)               Expected Discharge Plan:  Skilled Nursing Facility  In-House Referral:  Clinical Social Work  Discharge planning Services  CM Consult  Post Acute Care Choice:    Choice offered to:     DME Arranged:    DME Agency:     HH Arranged:    HH Agency:     Status of Service:  Completed, signed off  If discussed at [employer] Meetings, dates discussed:    Additional Comments:  Lanier ClamMahabir, Aiman Noe, RN 11/24/2015, 1:00 PM

## 2015-11-24 NOTE — Clinical Social Work Placement (Signed)
Medical Social Worker facilitated patient discharge including contacting patient family and facility to confirm patient discharge plans.  Clinical information faxed to facility and family agreeable with plan.  MSW [facility] .  RN to call report prior to discharge.  Medical Social Worker will sign off for now as social work intervention is no longer needed. Please consult us again if new need arises.  CLINICAL SOCIAL WORK PLACEMENT  NOTE  Date:  11/24/2015  Patient Details  Name: [name] [name] MRN: [id number] Date of Birth: 04/22/1930  Clinical Social Work is seeking post-discharge placement for this patient at the Skilled  Nursing Facility level of care (*CSW will initial, date and re-position this form in  chart as items are completed):  Yes   Patient/family provided with [facility] Clinical Social Work Department's list of facilities offering this level of care within the geographic area requested by the patient (or if unable, by the patient's family).  Yes   Patient/family informed of their freedom to choose among providers that offer the needed level of care, that participate in Medicare, Medicaid or managed care program needed by the patient, have an available bed and are willing to accept the patient.  Yes   Patient/family informed of [facility]'s ownership interest in [facility] and [facility], as well as of the fact that they are under no obligation to receive care at these facilities.  PASRR submitted to EDS on 11/24/15     PASRR number received on       Existing PASRR number confirmed on 11/24/15     FL2 transmitted to all facilities in geographic area requested by pt/family on 11/24/15     FL2 transmitted to all facilities within larger geographic area on       Patient informed that his/her managed care company has contracts with or will negotiate with certain facilities, including the following:         Yes   Patient/family informed of bed offers received.  Patient chooses bed at  [facility] )     Physician recommends and patient chooses bed at      Patient to be transferred to  [facility] ) on 11/24/15.  Patient to be transferred to facility by  [facility] )     Patient family notified on 11/24/15 of transfer.  Name of family member notified:   (Pt's dtr, [name] [name] )     PHYSICIAN Please prepare priority discharge summary, including medications, Please sign FL2     Additional Comment:    _______________________________________________ Derenda FennelNixon, Shrika Milos A 11/24/2015, 11:58 AM

## 2015-11-24 NOTE — Progress Notes (Signed)
Attempted report called to [facility].  Nursing supervisor was also called with no answer.  Unable to leave message due to full voice mail.  Patient transported to [facility] via [facility].

## 2015-11-24 NOTE — Care Management Important Message (Signed)
Important Message  Patient Details  Name: [name] [name] MRN: [id number] Date of Birth: 03/19/1930   Medicare Important Message Given:  Yes    Haskell FlirtJamison, Lauretta Sallas 11/24/2015, 9:27 AMImportant Message  Patient Details  Name: [name] [name] MRN: [id number] Date of Birth: 07/09/1930   Medicare Important Message Given:  Yes    Haskell FlirtJamison, Loula Marcella 11/24/2015, 9:27 AM

## 2015-11-24 NOTE — Discharge Instructions (Signed)
Nursing Home:  Check CBC and BMP in 1 week.  Monitor for bleeding.     Pulmonary Edema Pulmonary edema is abnormal fluid buildup in the lungs that can make it hard to breathe. HOME CARE  Talk to your doctor about an exercise program.  Eat a healthy diet:  Eat fresh fruits, vegetables, and lean meats.  Limit high fat and salty foods.  Avoid processed, canned, or fried foods.  Avoid fast food.  Follow your doctor's advice about taking medicine and recording the medicine you take.  Follow your doctor's advice about keeping a record of your weight.  Talk to your doctor about keeping track of your blood pressure.  Do not smoke.  Do not use nicotine patches or nicotine gum.  Make a follow-up appointment with your doctor.  Ask your doctor for a copy of your latest heart tracing (ECG) and keep a copy with you at all times. GET HELP RIGHT AWAY IF:   You have chest pain. THIS IS AN EMERGENCY. Do not wait to see if the pain will go away. Call for local emergency medical help. Do not drive yourself to the hospital.  You have sweating, feel sick to your stomach (nauseous), or are experiencing shortness of breath.  Your weight increases more than your doctor tells you it should.  You start to have shortness of breath.  You notice more swelling in your hands, feet, ankles, or belly.  You have dizziness, blurred vision, headache, or unsteadiness that does not go away.  You cough up bloody spit.  You have a cough that does not go away.  You are unable to sleep because it is hard to breathe.  You begin to feel a "jumping" or "fluttering" sensation (palpitations) in the chest that is unusual for you. MAKE SURE YOU:   Understand these instructions.  Will watch your condition.  Will get help right away if you are not doing well or get worse.   This information is not intended to replace advice given to you by your health care provider. Make sure you discuss any questions you  have with your health care provider.   Document Released: 12/29/2008 Document Revised: 01/15/2013 Document Reviewed: 09/17/2012 Elsevier Interactive Patient Education 2016 Elsevier Inc.  Heart Failure Heart failure means your heart has trouble pumping blood. This makes it hard for your body to work well. Heart failure is usually a long-term (chronic) condition. You must take good care of yourself and follow your doctor's treatment plan. HOME CARE  Take your heart medicine as told by your doctor.  Do not stop taking medicine unless your doctor tells you to.  Do not skip any dose of medicine.  Refill your medicines before they run out.  Take other medicines only as told by your doctor or pharmacist.  Stay active if told by your doctor. The elderly and people with severe heart failure should talk with a doctor about physical activity.  Eat heart-healthy foods. Choose foods that are without trans fat and are low in saturated fat, cholesterol, and salt (sodium). This includes fresh or frozen fruits and vegetables, fish, lean meats, fat-free or low-fat dairy foods, whole grains, and high-fiber foods. Lentils and dried peas and beans (legumes) are also good choices.  Limit salt if told by your doctor.  Cook in a healthy way. Roast, grill, broil, bake, poach, steam, or stir-fry foods.  Limit fluids as told by your doctor.  Weigh yourself every morning. Do this after you pee (urinate) and  before you eat breakfast. Write down your weight to give to your doctor.  Take your blood pressure and write it down if your doctor tells you to.  Ask your doctor how to check your pulse. Check your pulse as told.  Lose weight if told by your doctor.  Stop smoking or chewing tobacco. Do not use gum or patches that help you quit without your doctor's approval.  Schedule and go to doctor visits as told.  Nonpregnant women should have no more than 1 drink a day. Men should have no more than 2 drinks a  day. Talk to your doctor about drinking alcohol.  Stop illegal drug use.  Stay current with shots (immunizations).  Manage your health conditions as told by your doctor.  Learn to manage your stress.  Rest when you are tired.  If it is really hot outside:  Avoid intense activities.  Use air conditioning or fans, or get in a cooler place.  Avoid caffeine and alcohol.  Wear loose-fitting, lightweight, and light-colored clothing.  If it is really cold outside:  Avoid intense activities.  Layer your clothing.  Wear mittens or gloves, a hat, and a scarf when going outside.  Avoid alcohol.  Learn about heart failure and get support as needed.  Get help to maintain or improve your quality of life and your ability to care for yourself as needed. GET HELP IF:   You gain weight quickly.  You are more short of breath than usual.  You cannot do your normal activities.  You tire easily.  You cough more than normal, especially with activity.  You have any or more puffiness (swelling) in areas such as your hands, feet, ankles, or belly (abdomen).  You cannot sleep because it is hard to breathe.  You feel like your heart is beating fast (palpitations).  You get dizzy or light-headed when you stand up. GET HELP RIGHT AWAY IF:   You have trouble breathing.  There is a change in mental status, such as becoming less alert or not being able to focus.  You have chest pain or discomfort.  You faint. MAKE SURE YOU:   Understand these instructions.  Will watch your condition.  Will get help right away if you are not doing well or get worse.   This information is not intended to replace advice given to you by your health care provider. Make sure you discuss any questions you have with your health care provider.   Document Released: 10/20/2007 Document Revised: 01/31/2014 Document Reviewed: 02/27/2012 Elsevier Interactive Patient Education [employer] Elsevier Inc.

## 2015-11-24 NOTE — Progress Notes (Signed)
Patient Name: [name] [name] Date of Encounter: 11/24/2015  Primary Cardiologist: Dr. Dewayne HatchSkains  Hospital Problem List     Active Problems:   Depression   Essential hypertension   Hyperlipidemia   Memory loss   IPF (idiopathic pulmonary fibrosis) (HCC)   Hypothyroidism   Fatigue   PAF (paroxysmal atrial fibrillation) (HCC)   Urinary incontinence   Pulmonary edema   Acute on chronic systolic CHF (congestive heart failure) (HCC)   Hyperbilirubinemia     Subjective   Denies CP or dyspnea  Inpatient Medications    Scheduled Meds: . apixaban  2.5 mg Oral BID  . carvedilol  3.125 mg Oral BID  . digoxin  0.125 mg Oral Daily  . escitalopram  20 mg Oral Daily  . furosemide  40 mg Intravenous Daily  . levothyroxine  75 mcg Oral QAC breakfast  . pantoprazole  40 mg Oral Daily  . potassium chloride  20 mEq Oral TID  . sodium chloride flush  3 mL Intravenous Q12H   Continuous Infusions:   PRN Meds: acetaminophen **OR** acetaminophen, ondansetron **OR** ondansetron (ZOFRAN) IV, traMADol   Vital Signs    Vitals:   11/23/15 2049 11/24/15 0632 11/24/15 0913 11/24/15 0914  BP: (!) 90/57 103/61  106/78  Pulse: 78 88 92 92  Resp: 16 20    Temp: 97.9 F (36.6 C) 98.2 F (36.8 C)    TempSrc: Oral Oral    SpO2: 97% 100%    Weight:  130 lb 14.4 oz (59.4 kg)    Height:        Intake/Output Summary (Last 24 hours) at 11/24/15 1110 Last data filed at 11/24/15 0644  Gross per 24 hour  Intake              280 ml  Output             1050 ml  Net             -770 ml   Filed Weights   11/22/15 0629 11/23/15 0500 11/24/15 [id number]  Weight: 138 lb 12.8 oz (63 kg) 133 lb 4.8 oz (60.5 kg) 130 lb 14.4 oz (59.4 kg)    Physical Exam    GEN: Well nourished, frail in no acute distress HEENT: Grossly normal.  Neck: Supple Cardiac: irreg  Respiratory:  Minimal basilar crackles GI: Soft, nontender, nondistended Skin: warm and dry, no rash. Neuro:  Grossly intact. Psych: AAOx3.   Normal affect. Ext: no edema  Labs    CBC  Recent Labs  11/22/15 0444  WBC 4.5  HGB 13.9  HCT 41.8  MCV 110.3*  PLT 98*   Basic Metabolic Panel  Recent Labs  11/23/15 0426 11/24/15 0448  NA 141 140  K 3.7 3.9  CL 97* 96*  CO2 34* 35*  GLUCOSE 127* 80  BUN 17 14  CREATININE 0.99 0.87  CALCIUM 9.2 9.0  MG 1.5*  --     Telemetry  atrial fibrillation HR 90s to low 100s-Personally Reviewed  Cardiac Studies   2D ECHO 06/03/15 LV EF: 25% - 30% Study Conclusions - Left ventricle: The cavity size was normal. Wall thickness was  increased in a pattern of moderate LVH. Systolic function was  severely reduced. The estimated ejection fraction was in the  range of 25% to 30%. Diffuse hypokinesis. The study is not  technically sufficient to allow evaluation of LV diastolic  function. - Aortic valve: Trileaflet; mildly calcified leaflets. There was no  stenosis. There  was mild regurgitation. - Mitral valve: Calcified annulus. Mildly thickened leaflets .  There was mild regurgitation. - Left atrium: Severely dilated at 55 ml/m2. - Right ventricle: The cavity size was moderately dilated. Systolic  function is reduced. - Right atrium: Severely dilated at 28 cm2. - Tricuspid valve: There was moderate regurgitation. - Pulmonary arteries: PA peak pressure: 64 mm Hg (S). - Inferior vena cava: The vessel was normal in size. The  respirophasic diameter changes were in the normal range (>= 50%),  consistent with normal central venous pressure. - Pericardium, extracardiac: A trivial pericardial effusion was  identified posterior to the heart. Features were not consistent  with tamponade physiology. Impressions - Compared to a prior echo in 2015, the LVEF has reduced from  55-60% to 25-30% wtih global hypokinesis. Atrial fibrillation is  noted.  Patient Profile     [name] [name] is a 82 y.o. female with a history of permanent atrial fibrillation, chronic  systolic CHF, HTN, HLD, IPF, and hypothyroidism who [facility] with weakness, lethargy, decreased appetite x several weeks  Assessment & Plan    Acute on chronic combined S/D CHF/Cardiomyopathy: in the setting of not taking her diuretics. She had not been taking Lasix over the past 3 weeks secondary to incontinence and inconvenience according to the notes.  -- Newly diagnosed on most recent ECHO (05/2015) revealed EF to 25-30% with global HK. Possibly tachycardia mediated? -- Continue Coreg 3.125 mg BID. BP too soft to add ACE/ARB. Change lasix to 40 mg daily.   Permanent atrial fibrillation: CHA2DS2 VASc score of 5. Continue coreg and dig for rate control; continue apixaban 2.5 BID  Elevated troponin: low and flat c/w demand ischemia   Hypothyroidism: TSH 5.4 but T4 elevated at 1.75. Per IM  Idiopathic pulmonary fibrosis: followed by Dr. Kendrick FriesMcQuaid  Pt can be DCed from a cardiac standpoint and FU with Dr Anne FuSkains. Needs Bmet one week following DC.  Signed, Olga MillersBrian Emani Taussig, MD  11/24/2015, 11:10 AM

## 2015-11-24 NOTE — Discharge Summary (Signed)
Physician Discharge Summary  [name] [name] ZOX:[id number] DOB: [birth year] DOA: 11/20/2015  PCP: Nelwyn Salisbury, MD  Admit date: 11/20/2015 Discharge date: 11/24/2015  Admitted From: ALF  Disposition:  SNF  Discharge Recommendations:   Nursing Home:  Check CBC and BMP in 1 week.  Monitor for bleeding on Eliquis 1. Follow up with PCP in 1-2 weeks 2. Follow up with cardiologist Dr. Anne Fu in 3 weeks.  3. Monitor weights 3 times per week and notify MD for weight gain over 2 pounds. 4. Please obtain BMP/CBC in one week to monitor platelets, potassium and kidney function.  Discharge Condition: STABLE CODE STATUS: FULL Diet recommendation: Heart Healthy  Brief/Interim Summary: The patient has a history of systolic HF with an EF of 25 -81%. She also has permanent atrial fib.   She presented with complaints of generalized weakness. She has not been taking Lasix over the past 3 weeks secondary to incontinence and inconvenience according to the notes. She is pleasantly confused. She does know that she is in the hospital and remembered eventually that she lived in a retirement home. She does not endorse any complaints however, The patient denies any new symptoms such as chest discomfort, neck or arm discomfort. There has been no new shortness of breath, PND or orthopnea. There have been no reported palpitations, presyncope or syncope .  In the ED her BNP was elevated as below and she has a very mild troponin increase. We are called to help with these issues.   Assessment & Plan:   Acute on chronic systolic CHF/cardiomyopathy - Most recent echo in 06/03/15, with EF 25-30%, with diffuse hypokinesis - Patient presents with lower extremity edema, evidence of volume overload on chest x-ray, elevated BNP, this all started after her PCP stopped her Lasix for urinary incontinence. - Patient admitted under CHF pathway, check daily weight, strict ins and outs, will start on Lasix 40 mg IV twice  a day. - Patient apparently was taking both metoprolol and Coreg prior to admission.  DC metoprolol, continue Coreg for better heart rate control, patient not on a CE I/ARB secondary to soft blood pressure per cardiology note as an outpatient. - First troponin 0.04, this is most likely due to her acute CHF, trended troponins have been flat. - Patient with prolonged QTC, low magnesium will be treated IV supplement. Continue telemetry monitoring. - Cardiology consulted-see notes.  Pt was restarted on lasix 40 mg daily after IV diuresis.  Pt started on digoxin and also started on low dose eliquis BID.  Pt to follow up with Dr. Anne Fu in 3 weeks.   Hyperbilirubinemia - Denies any abdominal pain,  right upper quadrant ultrasound reveals moderate right pleural effusion otherwise no significant findings.  Lower extremity edema right more than left - Most likely due to CHF, lower extremity venous Doppler neg for DVT.   Atrial fibrillation - Patient used to be on Eliquis, started by her PCP on 11/02/2015 secondary to easy bruising, recurrent epistaxis and skin bleed, and started her on aspirin 81 mg every other day, discussed this with the family, and they do understand the increased risk for acute CVA with holding Eliquis. - Continue Coreg as tolerated.  - Pt had an episode of Afib with RVR 10/29 - given diltizem 10 mg IV x 1, consider increasing coreg to 6.25 mg, as dose was reduced from 25 mg bid on admission because of soft BPs.  - Appreciate cardiology consultation.  Pt started on eliquis 2.5 BID and digoxin.  Ideopathicpulmonary fibrosis - Followed by Dr. Kendrick FriesMcQuaid, seems to be stable  Generalized weakness and fatigue - This is most likely related to significant systolic CHF with EF 25-30% and the combination of 2 beta blockers which has been discontinued. - PT eval reviewed, concerning that patient may need higher level of care for short term before going back to ALF.  Asked CM and CSW to  investigate and prepare disposition. Pt to go to SNF at discharge.   Hypothyroid - cont with synthroid  Thrombocytopenia - recommend rechecking CBC in 1 week, no bleeding in hospital and Hg has been stable.    GERD - Continue with PPI  AM Labs Ordered, also please review Full Orders  Family Communication:Admission, patients condition and plan of care including tests being ordered have been discussed with the patient, husband anddaughter who indicate understanding and agree with the plan and Code Status.  Code Status Full  Dispo: SNF   Consultants:  cardiology  Discharge Diagnoses:  Active Problems:   Depression   Essential hypertension   Hyperlipidemia   Memory loss   IPF (idiopathic pulmonary fibrosis) (HCC)   Hypothyroidism   Fatigue   PAF (paroxysmal atrial fibrillation) (HCC)   Urinary incontinence   Pulmonary edema   Acute on chronic systolic CHF (congestive heart failure) (HCC)   Hyperbilirubinemia  Discharge Instructions  Discharge Instructions    Diet - low sodium heart healthy    Complete by:  As directed    Increase activity slowly    Complete by:  As directed        Medication List    STOP taking these medications   aspirin EC 81 MG tablet   ferrous sulfate 324 (65 Fe) MG Tbec   metoprolol tartrate 25 MG tablet Commonly known as:  LOPRESSOR   Vitamin D (Ergocalciferol) 50000 units Caps capsule Commonly known as:  DRISDOL     TAKE these medications   apixaban 2.5 MG Tabs tablet Commonly known as:  ELIQUIS Take 1 tablet (2.5 mg total) by mouth 2 (two) times daily.   carvedilol 3.125 MG tablet Commonly known as:  COREG Take 1 tablet (3.125 mg total) by mouth 2 (two) times daily. What changed:  medication strength  how much to take   clobetasol ointment 0.05 % Commonly known as:  TEMOVATE Apply 1 application topically 2 (two) times daily.   digoxin 0.125 MG tablet Commonly known as:  LANOXIN Take 1 tablet (0.125 mg  total) by mouth daily. Start taking on:  11/25/2015   escitalopram 20 MG tablet Commonly known as:  LEXAPRO Take 20 mg by mouth daily.   furosemide 40 MG tablet Commonly known as:  LASIX Take 1 tablet (40 mg total) by mouth daily.   levothyroxine 75 MCG tablet Commonly known as:  SYNTHROID, LEVOTHROID Take 75 mcg by mouth daily before breakfast.   pantoprazole 40 MG tablet Commonly known as:  PROTONIX Take 1 tablet (40 mg total) by mouth daily. What changed:  when to take this   potassium chloride SA 20 MEQ tablet Commonly known as:  K-DUR,KLOR-CON Take 1 tablet (20 mEq total) by mouth 2 (two) times daily.   traMADol 50 MG tablet Commonly known as:  ULTRAM Take 1 tablet (50 mg total) by mouth every 6 (six) hours as needed for moderate pain.      Follow-up Information    FRY,STEPHEN A, MD. Schedule an appointment as soon as possible for a visit in 3 week(s).   Specialty:  Family Medicine  Contact information: [address] [place] [address] [phone]        Donato SchultzMark Skains, MD. Schedule an appointment as soon as possible for a visit in 3 week(s).   Specialty:  Cardiology Why:  Hospital Follow Up  Contact information: [address] [place] [address] [phone]          Allergies  Allergen Reactions  . Celecoxib     REACTION: unspecified  . Clindamycin Hcl Diarrhea  . Rofecoxib     REACTION: unspecified  . Sertraline Hcl     REACTION: unspecified   Procedures/Studies: Dg Chest 2 View  Result Date: 11/20/2015 CLINICAL DATA:  Per spouse patient has had increased weakness, lethargy, decreased appetite x several weeks. ; HTN; ex smoker EXAM: CHEST  2 VIEW COMPARISON:  Chest CT, 02/07/2014.  Chest radiograph, 12/27/2013. FINDINGS: Cardiac silhouette is mildly enlarged. No mediastinal or hilar masses. There are irregularly thickened interstitial markings, increased when compared to the prior chest radiograph. There also small  bilateral pleural effusions and thickening along the fissures. No pneumothorax. Skeletal structures are demineralized. There are compression fractures of the lower thoracic and lumbar spine several treated with previous vertebroplasty. These are stable. IMPRESSION: 1. Findings are consistent with congestive heart failure and interstitial edema superimposed on chronic interstitial lung disease. Electronically Signed   By: Amie Portlandavid  Ormond M.D.   On: 11/20/2015 13:28   Ct Head Wo Contrast  Result Date: 11/20/2015 CLINICAL DATA:  Increasing weakness and lethargy with decreased appetite for several weeks. Patient fell 3 days ago. EXAM: CT HEAD WITHOUT CONTRAST TECHNIQUE: Contiguous axial images were obtained from the base of the skull through the vertex without intravenous contrast. COMPARISON:  MRI brain 01/06/2011.  CT head 04/29/2004 FINDINGS: Brain: There is no evidence of acute intracranial hemorrhage, mass lesion, brain edema or extra-axial fluid collection. There is stable atrophy with prominence of the ventricles and subarachnoid spaces. Atrophy appears greatest within the right frontal lobe. There is no CT evidence of acute cortical infarction. Mild periventricular white matter disease appears unchanged. Vascular: Intracranial vascular calcifications noted. Skull: Negative for fracture or focal lesion. Sinuses/Orbits: The visualized paranasal sinuses and mastoid air cells are clear. No orbital abnormalities are seen. Other: None. IMPRESSION: No acute intracranial findings. Stable atrophy and minimal periventricular white matter disease. Electronically Signed   By: Carey BullocksWilliam  Veazey M.D.   On: 11/20/2015 13:19   [place] Abdomen Limited Ruq  Result Date: 11/20/2015 CLINICAL DATA:  Hyperbilirubinemia. EXAM: US ABDOMEN LIMITED - RIGHT UPPER QUADRANT COMPARISON:  None. FINDINGS: Gallbladder: No gallstones or wall thickening visualized. No sonographic Murphy sign noted by sonographer. Common bile duct: Diameter: 3.7  mm Liver: No focal lesion identified. Within normal limits in parenchymal echogenicity. Moderate right pleural effusion. IMPRESSION: Negative for gallstones.  No biliary obstruction Right pleural effusion Electronically Signed   By: Marlan Palauharles  Clark M.D.   On: 11/20/2015 18:14     Subjective: Pt feeling much better, diuresed well.  Started on oral lasix per cardiology.  No bleeding on eliquis.   Discharge Exam: Vitals:   11/24/15 0913 11/24/15 0914  BP:  106/78  Pulse: 92 92  Resp:    Temp:     Vitals:   11/23/15 2049 11/24/15 0632 11/24/15 0913 11/24/15 0914  BP: (!) 90/57 103/61  106/78  Pulse: 78 88 92 92  Resp: 16 20    Temp: 97.9 F (36.6 C) 98.2 F (36.8 C)    TempSrc: Oral Oral  SpO2: 97% 100%    Weight:  59.4 kg (130 lb 14.4 oz)    Height:       General exam: Elderly, chronically ill-appearing female in no apparent distress, cooperative and pleasant. Respiratory system:  No increased work of breathing diminished breath sounds right lower lobe. Cardiovascular system: S1 & S2 heard, irregularly irregular and tachycardic.  Gastrointestinal system: Abdomen is nondistended, soft and nontender. Normal bowel sounds heard. Central nervous system: Alert and oriented. No focal neurological deficits. Extremities: Diffuse upper and lower extremity edema.  The results of significant diagnostics from this hospitalization (including imaging, microbiology, ancillary and laboratory) are listed below for reference.     Microbiology: Recent Results (from the past 240 hour(s))  MRSA PCR Screening     Status: None   Collection Time: 11/20/15  7:07 PM  Result Value Ref Range Status   MRSA by PCR NEGATIVE NEGATIVE Final    Comment:        The GeneXpert MRSA Assay (FDA approved for NASAL specimens only), is one component of a comprehensive MRSA colonization surveillance program. It is not intended to diagnose MRSA infection nor to guide or monitor treatment for MRSA infections.       Labs: BNP (last 3 results)  Recent Labs  11/20/15 1120  BNP 752.6*   Basic Metabolic Panel:  Recent Labs Lab 11/20/15 1120 11/21/15 0209 11/22/15 0444 11/23/15 0426 11/24/15 0448  NA 141  --  140 141 140  K 4.4  --  3.2* 3.7 3.9  CL 105  --  102 97* 96*  CO2 27  --  32 34* 35*  GLUCOSE 100*  --  76 127* 80  BUN 17  --  18 17 14   CREATININE 0.99  --  0.97 0.99 0.87  CALCIUM 9.4  --  8.7* 9.2 9.0  MG  --  1.4*  --  1.5*  --    Liver Function Tests:  Recent Labs Lab 11/20/15 1219  AST 53*  ALT 45  ALKPHOS 71  BILITOT 3.1*  PROT 7.5  ALBUMIN 3.6   No results for input(s): LIPASE, AMYLASE in the last 168 hours. No results for input(s): AMMONIA in the last 168 hours. CBC:  Recent Labs Lab 11/20/15 1120 11/21/15 0209 11/22/15 0444  WBC 4.6 5.2 4.5  HGB 16.0* 14.7 13.9  HCT 46.9* 43.7 41.8  MCV 110.4* 110.9* 110.3*  PLT 105* 94* 98*   Cardiac Enzymes:  Recent Labs Lab 11/20/15 1219 11/20/15 2003 11/21/15 0209  TROPONINI 0.04* 0.05* 0.06*   BNP: Invalid input(s): POCBNP CBG:  Recent Labs Lab 11/20/15 1137  GLUCAP 79   D-Dimer No results for input(s): DDIMER in the last 72 hours. Hgb A1c No results for input(s): HGBA1C in the last 72 hours. Lipid Profile No results for input(s): CHOL, HDL, LDLCALC, TRIG, CHOLHDL, LDLDIRECT in the last 72 hours. Thyroid function studies No results for input(s): TSH, T4TOTAL, T3FREE, THYROIDAB in the last 72 hours.  Invalid input(s): FREET3 Anemia work up No results for input(s): VITAMINB12, FOLATE, FERRITIN, TIBC, IRON, RETICCTPCT in the last 72 hours. Urinalysis    Component Value Date/Time   COLORURINE AMBER (A) 11/20/2015 1551   APPEARANCEUR CLEAR 11/20/2015 1551   LABSPEC 1.014 11/20/2015 1551   PHURINE 5.5 11/20/2015 1551   GLUCOSEU NEGATIVE 11/20/2015 1551   HGBUR NEGATIVE 11/20/2015 1551   BILIRUBINUR SMALL (A) 11/20/2015 1551   BILIRUBINUR n 05/12/2015 1435   KETONESUR NEGATIVE  11/20/2015 1551   PROTEINUR NEGATIVE 11/20/2015 1551  UROBILINOGEN 0.2 05/12/2015 1435   UROBILINOGEN 0.2 09/07/2010 1446   NITRITE NEGATIVE 11/20/2015 1551   LEUKOCYTESUR NEGATIVE 11/20/2015 1551   Sepsis Labs Invalid input(s): PROCALCITONIN,  WBC,  LACTICIDVEN Microbiology Recent Results (from the past 240 hour(s))  MRSA PCR Screening     Status: None   Collection Time: 11/20/15  7:07 PM  Result Value Ref Range Status   MRSA by PCR NEGATIVE NEGATIVE Final    Comment:        The GeneXpert MRSA Assay (FDA approved for NASAL specimens only), is one component of a comprehensive MRSA colonization surveillance program. It is not intended to diagnose MRSA infection nor to guide or monitor treatment for MRSA infections.    Time coordinating discharge: 33 minutes  SIGNED:  Standley Dakins, MD  [facility] 11/24/2015, 11:40 AM Pager   If 7PM-7AM, please contact night-coverage [email or web address] Password [email or web address]

## 2015-11-24 NOTE — Clinical Social Work Note (Signed)
Clinical Social Work Assessment  Patient Details  Name: [name] [name] MRN: [id number] Date of Birth: [birth year]  Date of referral:  11/23/15               Reason for consult:  Facility Placement, Discharge Planning                Permission sought to share information with:  Family Supports, Customer service manager, Case Optician, dispensing granted to share information::  Yes, Verbal Permission Granted  Name::      Radiation protection practitioner )  Agency::   (SNF's )  Relationship::   (Daughter )  Contact Information:   [phone])  Housing/Transportation Living arrangements for the past 2 months:  [place] of Information:  Patient, Adult Children, Spouse Patient Interpreter Needed:  None Criminal Activity/Legal Involvement Pertinent to Current Situation/Hospitalization:  No - Comment as needed Significant Relationships:  Adult Children, Spouse, [place] Lives with:  Spouse Do you feel safe going back to the place where you live?  No Need for family participation in patient care:  Yes (Comment)  Care giving concerns:  Patient admitted from [place] with husband.    Social Worker assessment / plan:  MSW met with patient and family (two dtrs and husband) present at bedside. MSW introduced MSW role and SNF process. MSW reviewed and provided SNF list. Patient husband interested in [facility] or [place]. Patient has been at [facility] about 5 years ago. No further concerns reported at this time. MSW remains available as needed.   Employment status:  Retired Nurse, adult PT Recommendations:  [place] / Referral to community resources:  [place]  Patient/Family's Response to care:  Pt a/o x4. Patient and family all very polite and agreeable to SNF placement at [facility]. Pt and family appreciated sw intervention.   Patient/Family's Understanding of and Emotional  Response to Diagnosis, Current Treatment, and Prognosis:  Family supportive and strongly involved in care planning.   Emotional Assessment Appearance:  Appears stated age Attitude/Demeanor/Rapport:   (Pleasant ) Affect (typically observed):  Accepting, Appropriate, Pleasant, Happy Orientation:  Oriented to Self, Oriented to Place, Oriented to  Time, Oriented to Situation Alcohol / Substance use:  Not Applicable Psych involvement (Current and /or in the community):  No (Comment)  Discharge Needs  Concerns to be addressed:  Denies Needs/Concerns at this time Readmission within the last 30 days:  No Current discharge risk:  Dependent with Mobility Barriers to Discharge:  No Barriers Identified   Glendon Axe, MSW [phone] 11/24/2015 12:42 PM

## 2015-11-25 ENCOUNTER — Encounter: Payer: Self-pay | Admitting: Adult Health

## 2015-11-25 ENCOUNTER — Non-Acute Institutional Stay (SKILLED_NURSING_FACILITY): Payer: Medicare Other | Admitting: Adult Health

## 2015-11-25 DIAGNOSIS — F329 Major depressive disorder, single episode, unspecified: Secondary | ICD-10-CM | POA: Diagnosis not present

## 2015-11-25 DIAGNOSIS — E038 Other specified hypothyroidism: Secondary | ICD-10-CM

## 2015-11-25 DIAGNOSIS — J84112 Idiopathic pulmonary fibrosis: Secondary | ICD-10-CM | POA: Diagnosis not present

## 2015-11-25 DIAGNOSIS — D696 Thrombocytopenia, unspecified: Secondary | ICD-10-CM

## 2015-11-25 DIAGNOSIS — E876 Hypokalemia: Secondary | ICD-10-CM | POA: Diagnosis not present

## 2015-11-25 DIAGNOSIS — I480 Paroxysmal atrial fibrillation: Secondary | ICD-10-CM

## 2015-11-25 DIAGNOSIS — I5023 Acute on chronic systolic (congestive) heart failure: Secondary | ICD-10-CM | POA: Diagnosis not present

## 2015-11-25 DIAGNOSIS — F32A Depression, unspecified: Secondary | ICD-10-CM

## 2015-11-25 DIAGNOSIS — K219 Gastro-esophageal reflux disease without esophagitis: Secondary | ICD-10-CM

## 2015-11-25 DIAGNOSIS — R531 Weakness: Secondary | ICD-10-CM | POA: Diagnosis not present

## 2015-11-25 NOTE — Progress Notes (Signed)
Patient ID: [name] [name], female   DOB: March 26, 1930, 82 y.o.   MRN: [id number]    DATE:  11/25/2015   MRN:  [id number]  BIRTHDAY: [birth year]  Facility:  Nursing Home Location:  [facility]  Nursing Home Room Number: 1203-P  LEVEL OF CARE:  SNF [phone])  Contact Information    Name Relation Home Work Mobile   [place] [phone]     Burnet,Broughton Daughter [phone]     Jamse Arn Daughter [phone]         Code Status History    Date Active Date Inactive Code Status Order ID Comments User Context   11/20/2015  4:28 PM 11/24/2015  5:33 PM Full Code [id number]  Starleen Arms, MD ED       Chief Complaint  Patient presents with  . Hospitalization Follow-up    HISTORY OF PRESENT ILLNESS:  This is an 82 year old female who has been admitted to [facility] on 11/24/15 from [facility] hospitalization 11/20/15 - 11/24/15. She has stopped taking Lasix for 3 weeks due to urinary incontinence and came to the hospital for generalized weakness. She was treated for Acute on chronic systolic CHF with IV Lasix then restarted PO Lasix. Cardiology was consulted and was started on Digoxin and low dose Eliquis.  She was on Eliquis before but was stopped and changed to ASA 81 mg due to easy bruising, recurrent epistaxis and skin bleed. She is not on ACEI/ARB due to soft BP. She was apparently taking Coreg and Metoprolol at the same time. She was kept on Coreg.  She has been admitted for a short-term rehabilitation.   PAST MEDICAL HISTORY:  Past Medical History:  Diagnosis Date  . Anemia   . Anxiety   . Arthritis   . Dementia   . Depression   . Diverticulosis of colon   . History of alcohol abuse   . Hyperlipidemia   . Hypertension   . IPF (idiopathic pulmonary fibrosis) (HCC)   . Irritable bowel syndrome   . OSA (obstructive sleep apnea)   . Osteoporosis   . PMR (polymyalgia rheumatica) (HCC)   . Postherpetic neuralgia   . Spinal  stenosis   . Thyroid disease   . Vertebral fracture   . Vitamin D deficiency      CURRENT MEDICATIONS: Reviewed  Patient's Medications  New Prescriptions   No medications on file  Previous Medications   APIXABAN (ELIQUIS) 2.5 MG TABS TABLET    Take 1 tablet (2.5 mg total) by mouth 2 (two) times daily.   CARVEDILOL (COREG) 3.125 MG TABLET    Take 1 tablet (3.125 mg total) by mouth 2 (two) times daily.   CLOBETASOL OINTMENT (TEMOVATE) 0.05 %    Apply 1 application topically 2 (two) times daily.   DIGOXIN (LANOXIN) 0.125 MG TABLET    Take 1 tablet (0.125 mg total) by mouth daily.   ESCITALOPRAM (LEXAPRO) 20 MG TABLET    Take 20 mg by mouth daily.   FUROSEMIDE (LASIX) 40 MG TABLET    Take 1 tablet (40 mg total) by mouth daily.   LEVOTHYROXINE (SYNTHROID, LEVOTHROID) 75 MCG TABLET    Take 75 mcg by mouth daily before breakfast.   PANTOPRAZOLE (PROTONIX) 40 MG TABLET    Take 1 tablet (40 mg total) by mouth daily.   POTASSIUM CHLORIDE SA (K-DUR,KLOR-CON) 20 MEQ TABLET    Take 1 tablet (20 mEq total) by mouth 2 (two) times daily.   TRAMADOL (ULTRAM) 50  MG TABLET    Take 1 tablet (50 mg total) by mouth every 6 (six) hours as needed for moderate pain.  Modified Medications   No medications on file  Discontinued Medications   No medications on file     Allergies  Allergen Reactions  . Celecoxib     REACTION: unspecified  . Clindamycin Hcl Diarrhea  . Rofecoxib     REACTION: unspecified  . Sertraline Hcl     REACTION: unspecified     REVIEW OF SYSTEMS:  GENERAL: no change in appetite, no fatigue, no weight changes, no fever, chills or weakness EYES: Denies change in vision, dry eyes, eye pain, itching or discharge EARS: Denies change in hearing, ringing in ears, or earache NOSE: Denies nasal congestion or epistaxis MOUTH and THROAT: Denies oral discomfort, gingival pain or bleeding, pain from teeth or hoarseness   RESPIRATORY: no cough, SOB, DOE, wheezing, hemoptysis CARDIAC: no  chest pain, edema or palpitations GI: no abdominal pain, diarrhea, constipation, heart burn, nausea or vomiting GU: Denies dysuria, frequency, hematuria, incontinence, or discharge PSYCHIATRIC: Denies feeling of depression or anxiety. No report of hallucinations, insomnia, paranoia, or agitation   PHYSICAL EXAMINATION  GENERAL APPEARANCE: Well nourished. In no acute distress. Normal body habitus SKIN:  Skin is warm and dry.  HEAD: Normal in size and contour. No evidence of trauma EYES: Lids open and close normally. No blepharitis, entropion or ectropion. PERRL. Conjunctivae are clear and sclerae are white. Lenses are without opacity EARS: Pinnae are normal. Patient hears normal voice tunes of the examiner MOUTH and THROAT: Lips are without lesions. Oral mucosa is moist and without lesions. Tongue is normal in shape, size, and color and without lesions NECK: supple, trachea midline, no neck masses, no thyroid tenderness, no thyromegaly LYMPHATICS: no LAN in the neck, no supraclavicular LAN RESPIRATORY: breathing is even & unlabored, BS CTAB, has O2 @ 2L/min via [place] continuously CARDIAC: irregularly irregular, no murmur,no extra heart sounds, no edema GI: abdomen soft, normal BS, no masses, no tenderness, no hepatomegaly, no splenomegaly EXTREMITIES:  Able to move X 4 extremities PSYCHIATRIC: Alert to place and person, disoriented to time. Affect and behavior are appropriate  LABS/RADIOLOGY: Labs reviewed: Basic Metabolic Panel:  Recent Labs  56/21/3010/28/17 0209 11/22/15 0444 11/23/15 0426 11/24/15 0448  NA  --  140 141 140  K  --  3.2* 3.7 3.9  CL  --  102 97* 96*  CO2  --  32 34* 35*  GLUCOSE  --  76 127* 80  BUN  --  18 17 14   CREATININE  --  0.97 0.99 0.87  CALCIUM  --  8.7* 9.2 9.0  MG 1.4*  --  1.5*  --    Liver Function Tests:  Recent Labs  05/04/15 1227 11/20/15 1219  AST 74* 53*  ALT 136* 45  ALKPHOS 71 71  BILITOT 0.9 3.1*  PROT 7.4 7.5  ALBUMIN 3.8 3.6    CBC:  Recent Labs  05/06/15 1058  11/05/15 1032 11/20/15 1120 11/21/15 0209 11/22/15 0444  WBC 7.1  < > 5.3 4.6 5.2 4.5  NEUTROABS 3.9  --  2.1  --   --   --   HGB 12.0  < > 14.7 16.0* 14.7 13.9  HCT 37.0  < > 45.2 46.9* 43.7 41.8  MCV 105.3*  < > 113.3* 110.4* 110.9* 110.3*  PLT 252.0  < > 125* 105* 94* 98*  < > = values in this interval not displayed.  Cardiac  Enzymes:  Recent Labs  11/20/15 1219 11/20/15 2003 11/21/15 0209  TROPONINI 0.04* 0.05* 0.06*   CBG:  Recent Labs  11/20/15 1137  GLUCAP 79      Dg Chest 2 View  Result Date: 11/20/2015 CLINICAL DATA:  Per spouse patient has had increased weakness, lethargy, decreased appetite x several weeks. ; HTN; ex smoker EXAM: CHEST  2 VIEW COMPARISON:  Chest CT, 02/07/2014.  Chest radiograph, 12/27/2013. FINDINGS: Cardiac silhouette is mildly enlarged. No mediastinal or hilar masses. There are irregularly thickened interstitial markings, increased when compared to the prior chest radiograph. There also small bilateral pleural effusions and thickening along the fissures. No pneumothorax. Skeletal structures are demineralized. There are compression fractures of the lower thoracic and lumbar spine several treated with previous vertebroplasty. These are stable. IMPRESSION: 1. Findings are consistent with congestive heart failure and interstitial edema superimposed on chronic interstitial lung disease. Electronically Signed   By: Amie Portlandavid  Ormond M.D.   On: 11/20/2015 13:28   Ct Head Wo Contrast  Result Date: 11/20/2015 CLINICAL DATA:  Increasing weakness and lethargy with decreased appetite for several weeks. Patient fell 3 days ago. EXAM: CT HEAD WITHOUT CONTRAST TECHNIQUE: Contiguous axial images were obtained from the base of the skull through the vertex without intravenous contrast. COMPARISON:  MRI brain 01/06/2011.  CT head 04/29/2004 FINDINGS: Brain: There is no evidence of acute intracranial hemorrhage, mass lesion,  brain edema or extra-axial fluid collection. There is stable atrophy with prominence of the ventricles and subarachnoid spaces. Atrophy appears greatest within the right frontal lobe. There is no CT evidence of acute cortical infarction. Mild periventricular white matter disease appears unchanged. Vascular: Intracranial vascular calcifications noted. Skull: Negative for fracture or focal lesion. Sinuses/Orbits: The visualized paranasal sinuses and mastoid air cells are clear. No orbital abnormalities are seen. Other: None. IMPRESSION: No acute intracranial findings. Stable atrophy and minimal periventricular white matter disease. Electronically Signed   By: Carey BullocksWilliam  Veazey M.D.   On: 11/20/2015 13:19   [place] Abdomen Limited Ruq  Result Date: 11/20/2015 CLINICAL DATA:  Hyperbilirubinemia. EXAM: US ABDOMEN LIMITED - RIGHT UPPER QUADRANT COMPARISON:  None. FINDINGS: Gallbladder: No gallstones or wall thickening visualized. No sonographic Murphy sign noted by sonographer. Common bile duct: Diameter: 3.7 mm Liver: No focal lesion identified. Within normal limits in parenchymal echogenicity. Moderate right pleural effusion. IMPRESSION: Negative for gallstones.  No biliary obstruction Right pleural effusion Electronically Signed   By: Marlan Palauharles  Clark M.D.   On: 11/20/2015 18:14    ASSESSMENT/PLAN:  Generalized weakness - for rehabilitation, PT and OT, for therapeutic strengthening exercises; fall precaution  Acute on chronic systolic CHF - EF 25-30% ; Cardiology was consulted and was given IV Lasix and now on Lasix 40 mg 1 tab PO Q D, continue Coreg 3.125 mg 1 tab PO BID and Digoxin 0.125 mg 1 tab PO Q D; check BMP and Digoxin level in  1 week; weigh daily; follow-up with cardiology, Dr. Donato SchultzMark Skains in 3 weeks  Hypomagnesemia - Mg 1.5 , low; was supplemented in the hospital; check Mg level  Depression - continue Lexapro 20 mg 1 tab PO Q D  GERD - continue Protonix 40 mg 1 tab PO Q D  Hyperbilirubinemia -   Direct bilirubin 3.1 ; re-check liver panel; no abdominal pain  Atrial fibrillation - rate-controlled; was  Re-started on Eliquis 2.5 mg 1 tab PO BID; continue Coreg 3.125 mg 1 tab PO BID and Digoxin 0.125 mg 1 tab PO Q D; dose of  Coreg was reduced fro 25 mg BID to 3.125 mg BID due to soft BP, will monitor and adjust dosage if needed  Idiopathic pulmonary fibrosis - follows-up with Dr. Kendrick Fries  Hypothyroidism - continue Synthroid 75 mcg 1 tab PO Q D Lab Results  Component Value Date   TSH 5.431 (H) 11/20/2015   Hypokalemia - continue KCL ER 20 meq 1 tab PO BID; check BMP Lab Results  Component Value Date   K 3.9 11/24/2015   Thrombocytopenia - check CBC in 1 week Lab Results  Component Value Date   PLT 98 (L) 11/22/2015       Goals of care:  Short-term rehabilitation     Kenard Gower, NP [facility] [phone]

## 2015-11-26 LAB — TSH: TSH: 3.95 u[IU]/mL (ref 0.41–5.90)

## 2015-11-26 LAB — BASIC METABOLIC PANEL
BUN: 17 mg/dL (ref 4–21)
CREATININE: 0.7 mg/dL (ref 0.5–1.1)
Glucose: 82 mg/dL
POTASSIUM: 4 mmol/L (ref 3.4–5.3)
Sodium: 144 mmol/L (ref 137–147)

## 2015-11-26 LAB — HEPATIC FUNCTION PANEL
ALK PHOS: 70 U/L (ref 25–125)
ALT: 20 U/L (ref 7–35)
AST: 21 U/L (ref 13–35)
BILIRUBIN, TOTAL: 1.8 mg/dL
Bilirubin, Direct: 0.69 mg/dL — AB (ref 0.01–0.4)

## 2015-11-27 ENCOUNTER — Encounter: Payer: Self-pay | Admitting: Adult Health

## 2015-11-27 ENCOUNTER — Non-Acute Institutional Stay (SKILLED_NURSING_FACILITY): Payer: Medicare Other | Admitting: Adult Health

## 2015-11-27 DIAGNOSIS — E440 Moderate protein-calorie malnutrition: Secondary | ICD-10-CM

## 2015-11-27 DIAGNOSIS — R748 Abnormal levels of other serum enzymes: Secondary | ICD-10-CM

## 2015-11-27 NOTE — Progress Notes (Signed)
Patient ID: [name] [name], female   DOB: 09/04/1930, 82 y.o.   MRN: [id number]    DATE:    11/27/15 MRN:  [id number]  BIRTHDAY: 10/29/1930  Facility:  Nursing Home Location:  [facility]  Nursing Home Room Number: 1203-P  LEVEL OF CARE:  SNF [phone](31)  Contact Information    Name Relation Home Work Mobile   [place],Charles G [place] [phone]     Burnet,Broughton Daughter [phone]     Jamse ArnShannon,Katy Daughter [phone]         Code Status History    Date Active Date Inactive Code Status Order ID Comments User Context   11/20/2015  4:28 PM 11/24/2015  5:33 PM Full Code [id number]  Starleen Armsawood S Elgergawy, MD ED       Chief Complaint  Patient presents with  . Acute Visit    Hypomagnesemia, protein-calorie malnutrition, hyperbilirubinemia    HISTORY OF PRESENT ILLNESS:  This is an 82 year old female who is currently having a short-term rehabilitation @ [address] [facility]. She was noted to have Mg 1.5 (low), albumin 2.78 (low), total bilirubin 1.78 (high) and direct bilirubin 0.69 (high) .  [facility] 11/20/15 - 11/24/15. She has stopped taking Lasix for 3 weeks due to urinary incontinence and came to the hospital for generalized weakness. She was treated for Acute on chronic systolic CHF with IV Lasix then restarted PO Lasix. Cardiology was consulted and was started on Digoxin and low dose Eliquis.  She was on Eliquis before but was stopped and changed to ASA 81 mg due to easy bruising, recurrent epistaxis and skin bleed. She is not on ACEI/ARB due to soft BP. She was apparently taking Coreg and Metoprolol at the same time. She was kept on Coreg.    PAST MEDICAL HISTORY:  Past Medical History:  Diagnosis Date  . Anemia   . Anxiety   . Arthritis   . Dementia   . Depression   . Diverticulosis of colon   . History of alcohol abuse   . Hyperlipidemia   . Hypertension   . IPF (idiopathic  pulmonary fibrosis) (HCC)   . Irritable bowel syndrome   . OSA (obstructive sleep apnea)   . Osteoporosis   . PMR (polymyalgia rheumatica) (HCC)   . Postherpetic neuralgia   . Spinal stenosis   . Thyroid disease   . Vertebral fracture   . Vitamin D deficiency      CURRENT MEDICATIONS: Reviewed  Patient's Medications  New Prescriptions   No medications on file  Previous Medications   APIXABAN (ELIQUIS) 2.5 MG TABS TABLET    Take 1 tablet (2.5 mg total) by mouth 2 (two) times daily.   CARVEDILOL (COREG) 3.125 MG TABLET    Take 1 tablet (3.125 mg total) by mouth 2 (two) times daily.   CLOBETASOL OINTMENT (TEMOVATE) 0.05 %    Apply 1 application topically 2 (two) times daily.   DIGOXIN (LANOXIN) 0.125 MG TABLET    Take 1 tablet (0.125 mg total) by mouth daily.   ESCITALOPRAM (LEXAPRO) 20 MG TABLET    Take 20 mg by mouth daily.   FUROSEMIDE (LASIX) 40 MG TABLET    Take 1 tablet (40 mg total) by mouth daily.   LEVOTHYROXINE (SYNTHROID, LEVOTHROID) 75 MCG TABLET    Take 75 mcg by mouth daily before breakfast.   MAGNESIUM OXIDE (MAG-OX) 400 MG TABLET    Take 400 mg by mouth daily.  Take for one week   PANTOPRAZOLE (PROTONIX) 40 MG TABLET    Take 1 tablet (40 mg total) by mouth daily.   POTASSIUM CHLORIDE SA (K-DUR,KLOR-CON) 20 MEQ TABLET    Take 1 tablet (20 mEq total) by mouth 2 (two) times daily.   PROTEIN (PROCEL) POWD    Take 2 scoop by mouth 2 (two) times daily.   TRAMADOL (ULTRAM) 50 MG TABLET    Take 1 tablet (50 mg total) by mouth every 6 (six) hours as needed for moderate pain.  Modified Medications   No medications on file  Discontinued Medications   No medications on file     Allergies  Allergen Reactions  . Celecoxib     REACTION: unspecified  . Clindamycin Hcl Diarrhea  . Rofecoxib     REACTION: unspecified  . Sertraline Hcl     REACTION: unspecified     REVIEW OF SYSTEMS:  GENERAL: no change in appetite, no fatigue, no weight changes, no fever, chills or  weakness EYES: Denies change in vision, dry eyes, eye pain, itching or discharge EARS: Denies change in hearing, ringing in ears, or earache NOSE: Denies nasal congestion or epistaxis MOUTH and THROAT: Denies oral discomfort, gingival pain or bleeding, pain from teeth or hoarseness   RESPIRATORY: no cough, SOB, DOE, wheezing, hemoptysis CARDIAC: no chest pain, edema or palpitations GI: no abdominal pain, diarrhea, constipation, heart burn, nausea or vomiting GU: Denies dysuria, frequency, hematuria, incontinence, or discharge PSYCHIATRIC: Denies feeling of depression or anxiety. No report of hallucinations, insomnia, paranoia, or agitation   PHYSICAL EXAMINATION  GENERAL APPEARANCE: Well nourished. In no acute distress. Normal body habitus SKIN:  Skin is warm and dry.  HEAD: Normal in size and contour. No evidence of trauma EYES: Lids open and close normally. No blepharitis, entropion or ectropion. PERRL. Conjunctivae are clear and sclerae are white. Lenses are without opacity EARS: Pinnae are normal. Patient hears normal voice tunes of the examiner MOUTH and THROAT: Lips are without lesions. Oral mucosa is moist and without lesions. Tongue is normal in shape, size, and color and without lesions NECK: supple, trachea midline, no neck masses, no thyroid tenderness, no thyromegaly LYMPHATICS: no LAN in the neck, no supraclavicular LAN RESPIRATORY: breathing is even & unlabored, BS CTAB, has O2 @ 2L/min via [place] continuously CARDIAC: irregularly irregular, no murmur,no extra heart sounds, no edema GI: abdomen soft, normal BS, no masses, no tenderness, no hepatomegaly, no splenomegaly EXTREMITIES:  Able to move X 4 extremities PSYCHIATRIC: Alert to place and person, disoriented to time. Affect and behavior are appropriate  LABS/RADIOLOGY: Labs reviewed: Basic Metabolic Panel:  Recent Labs  16/10/96 0209 11/22/15 0444 11/23/15 0426 11/24/15 0448 11/26/15  NA  --  140 141 140 144  K   --  3.2* 3.7 3.9 4.0  CL  --  102 97* 96*  --   CO2  --  32 34* 35*  --   GLUCOSE  --  76 127* 80  --   BUN  --  18 17 14 17   CREATININE  --  0.97 0.99 0.87 0.7  CALCIUM  --  8.7* 9.2 9.0  --   MG 1.4*  --  1.5*  --   --    Liver Function Tests:  Recent Labs  05/04/15 1227 11/20/15 1219 11/26/15 12/11/15  AST 74* 53* 21 26  ALT 136* 45 20 23  ALKPHOS 71 71 70 110  BILITOT 0.9 3.1*  --   --  PROT 7.4 7.5  --   --   ALBUMIN 3.8 3.6  --   --    CBC:  Recent Labs  05/06/15 1058  11/05/15 1032 11/20/15 1120 11/21/15 0209 11/22/15 0444  WBC 7.1  < > 5.3 4.6 5.2 4.5  NEUTROABS 3.9  --  2.1  --   --   --   HGB 12.0  < > 14.7 16.0* 14.7 13.9  HCT 37.0  < > 45.2 46.9* 43.7 41.8  MCV 105.3*  < > 113.3* 110.4* 110.9* 110.3*  PLT 252.0  < > 125* 105* 94* 98*  < > = values in this interval not displayed.  Cardiac Enzymes:  Recent Labs  11/20/15 1219 11/20/15 2003 11/21/15 0209  TROPONINI 0.04* 0.05* 0.06*   CBG:  Recent Labs  11/20/15 1137  GLUCAP 79      Dg Chest 2 View  Result Date: 11/20/2015 CLINICAL DATA:  Per spouse patient has had increased weakness, lethargy, decreased appetite x several weeks. ; HTN; ex smoker EXAM: CHEST  2 VIEW COMPARISON:  Chest CT, 02/07/2014.  Chest radiograph, 12/27/2013. FINDINGS: Cardiac silhouette is mildly enlarged. No mediastinal or hilar masses. There are irregularly thickened interstitial markings, increased when compared to the prior chest radiograph. There also small bilateral pleural effusions and thickening along the fissures. No pneumothorax. Skeletal structures are demineralized. There are compression fractures of the lower thoracic and lumbar spine several treated with previous vertebroplasty. These are stable. IMPRESSION: 1. Findings are consistent with congestive heart failure and interstitial edema superimposed on chronic interstitial lung disease. Electronically Signed   By: Amie Portlandavid  Ormond M.D.   On: 11/20/2015 13:28    Ct Head Wo Contrast  Result Date: 11/20/2015 CLINICAL DATA:  Increasing weakness and lethargy with decreased appetite for several weeks. Patient fell 3 days ago. EXAM: CT HEAD WITHOUT CONTRAST TECHNIQUE: Contiguous axial images were obtained from the base of the skull through the vertex without intravenous contrast. COMPARISON:  MRI brain 01/06/2011.  CT head 04/29/2004 FINDINGS: Brain: There is no evidence of acute intracranial hemorrhage, mass lesion, brain edema or extra-axial fluid collection. There is stable atrophy with prominence of the ventricles and subarachnoid spaces. Atrophy appears greatest within the right frontal lobe. There is no CT evidence of acute cortical infarction. Mild periventricular white matter disease appears unchanged. Vascular: Intracranial vascular calcifications noted. Skull: Negative for fracture or focal lesion. Sinuses/Orbits: The visualized paranasal sinuses and mastoid air cells are clear. No orbital abnormalities are seen. Other: None. IMPRESSION: No acute intracranial findings. Stable atrophy and minimal periventricular white matter disease. Electronically Signed   By: Carey BullocksWilliam  Veazey M.D.   On: 11/20/2015 13:19   [place] Abdomen Limited Ruq  Result Date: 11/20/2015 CLINICAL DATA:  Hyperbilirubinemia. EXAM: US ABDOMEN LIMITED - RIGHT UPPER QUADRANT COMPARISON:  None. FINDINGS: Gallbladder: No gallstones or wall thickening visualized. No sonographic Murphy sign noted by sonographer. Common bile duct: Diameter: 3.7 mm Liver: No focal lesion identified. Within normal limits in parenchymal echogenicity. Moderate right pleural effusion. IMPRESSION: Negative for gallstones.  No biliary obstruction Right pleural effusion Electronically Signed   By: Marlan Palauharles  Clark M.D.   On: 11/20/2015 18:14    ASSESSMENT/PLAN:  Hypomagnesemia - Mg 1.5 , low; start Magnesium 400 mg 1 tab by mouth daily 1 week; magnesium level in 1 week  Protein calorie malnutrition, moderate - albumin  2.78; start Procel 2 scoops by mouth twice a day; RD consult  Elevated liver enzymes -  recheck liver panel in 2 weeks  11/26/15  total bilirubin 1.78 and direct bilirubin 0.69     Kenard Gower, NP [facility] [phone]

## 2015-12-01 ENCOUNTER — Non-Acute Institutional Stay (SKILLED_NURSING_FACILITY): Payer: Medicare Other | Admitting: Internal Medicine

## 2015-12-01 ENCOUNTER — Encounter: Payer: Self-pay | Admitting: Internal Medicine

## 2015-12-01 DIAGNOSIS — E038 Other specified hypothyroidism: Secondary | ICD-10-CM | POA: Diagnosis not present

## 2015-12-01 DIAGNOSIS — J8410 Pulmonary fibrosis, unspecified: Secondary | ICD-10-CM | POA: Diagnosis not present

## 2015-12-01 DIAGNOSIS — E440 Moderate protein-calorie malnutrition: Secondary | ICD-10-CM | POA: Diagnosis not present

## 2015-12-01 DIAGNOSIS — F329 Major depressive disorder, single episode, unspecified: Secondary | ICD-10-CM | POA: Diagnosis not present

## 2015-12-01 DIAGNOSIS — R4189 Other symptoms and signs involving cognitive functions and awareness: Secondary | ICD-10-CM

## 2015-12-01 DIAGNOSIS — G894 Chronic pain syndrome: Secondary | ICD-10-CM | POA: Diagnosis not present

## 2015-12-01 DIAGNOSIS — I5022 Chronic systolic (congestive) heart failure: Secondary | ICD-10-CM | POA: Diagnosis not present

## 2015-12-01 DIAGNOSIS — R5381 Other malaise: Secondary | ICD-10-CM

## 2015-12-01 DIAGNOSIS — D696 Thrombocytopenia, unspecified: Secondary | ICD-10-CM

## 2015-12-01 DIAGNOSIS — I480 Paroxysmal atrial fibrillation: Secondary | ICD-10-CM | POA: Diagnosis not present

## 2015-12-01 DIAGNOSIS — K219 Gastro-esophageal reflux disease without esophagitis: Secondary | ICD-10-CM

## 2015-12-01 NOTE — Progress Notes (Signed)
LOCATION: [facility]  PCP: Nelwyn SalisburyFRY,STEPHEN A, MD   Code Status: Full Code  Goals of care: Advanced Directive information Advanced Directives 11/20/2015  Does patient have an advance directive? Yes  Type of Estate agentAdvance Directive Healthcare Power of [place];Living will  Copy of advanced directive(s) in chart? No - copy requested  Would patient like information on creating an advanced directive? -       Extended Emergency Contact Information Primary Emergency Contact: Wachter,Charles G Address: [address]          [place], [place] [address] [place] Home Phone: [phone] Relation: Spouse Secondary Emergency Contact: Burnet,Broughton  [place] Home Phone: [phone] Relation: Daughter   Allergies  Allergen Reactions  . Celecoxib     REACTION: unspecified  . Clindamycin Hcl Diarrhea  . Rofecoxib     REACTION: unspecified  . Sertraline Hcl     REACTION: unspecified    Chief Complaint  Patient presents with  . New Admit To SNF    New Admission Visit     HPI:  Patient is a 82 y.o. female seen today for short term rehabilitation post hospital admission from 11/20/15-11/24/15 with acute on chronic systolic CHF exacerbation. She required iv diuresis. She had increased right leg edema and DVT was ruled out. She had afib with RVR and required iv diltiazem and was started on eliquis. She had elevated bilirubin level. RUQ ultrasound was unrevealing for liver and gall bladder etiology. She was residing in independent living facility and had CNA services for few hours a day prior to this. She has history of systolic HF with EF 25-30%, afib, idiopathic pulmonary fibrosis among others. She is seen in her room today with her daughter and husband present.   Review of Systems:  Constitutional: Negative for fever, chills, diaphoresis. Feels weak and tired. HENT: Negative for headache, congestion, nasal discharge, difficulty swallowing.   Eyes:  Negative for blurred vision, double vision and discharge. Wears glasses. Respiratory: Negative for cough, shortness of breath and wheezing.  on oxygen. Cardiovascular: Negative for chest pain, palpitations, leg swelling.  Gastrointestinal: Negative for heartburn, nausea, vomiting, abdominal pain. Last bowel movement was today. Genitourinary: Negative for dysuria and flank pain.  Musculoskeletal: Negative for back pain, fall in the facility.  Skin: Negative for itching, rash.  Neurological: Negative for dizziness. Psychiatric/Behavioral: Negative for depression   Past Medical History:  Diagnosis Date  . Anemia   . Anxiety   . Arthritis   . Dementia   . Depression   . Diverticulosis of colon   . History of alcohol abuse   . Hyperlipidemia   . Hypertension   . IPF (idiopathic pulmonary fibrosis) (HCC)   . Irritable bowel syndrome   . OSA (obstructive sleep apnea)   . Osteoporosis   . PMR (polymyalgia rheumatica) (HCC)   . Postherpetic neuralgia   . Spinal stenosis   . Thyroid disease   . Vertebral fracture   . Vitamin D deficiency    Past Surgical History:  Procedure Laterality Date  . APPENDECTOMY    . BREAST BIOPSY    . CATARACT EXTRACTION    . DILATION AND CURETTAGE OF UTERUS     x several  . KNEE ARTHROSCOPY  10/2007 and 10/2010   left  . REPLACEMENT TOTAL KNEE  august 2012  . ROTATOR CUFF REPAIR    . VERTEBROPLASTY     x 3   Social History:   reports that she quit smoking about 27  years ago. Her smoking use included Cigarettes. She has a 20.00 pack-year smoking history. She has never used smokeless tobacco. She reports that she drinks alcohol. She reports that she does not use drugs.  Family History  Problem Relation Age of Onset  . Stroke Father 33  . Heart attack Father   . Coronary artery disease Father 22  . Alcohol abuse Mother   . Breast cancer Sister   . Heart disease Sister   . Colon cancer Neg Hx     Medications:   Medication List         Accurate as of 12/01/15 12:09 PM. Always use your most recent med list.          apixaban 2.5 MG Tabs tablet Commonly known as:  ELIQUIS Take 1 tablet (2.5 mg total) by mouth 2 (two) times daily.   carvedilol 3.125 MG tablet Commonly known as:  COREG Take 1 tablet (3.125 mg total) by mouth 2 (two) times daily.   clobetasol ointment 0.05 % Commonly known as:  TEMOVATE Apply 1 application topically 2 (two) times daily.   digoxin 0.125 MG tablet Commonly known as:  LANOXIN Take 1 tablet (0.125 mg total) by mouth daily.   escitalopram 20 MG tablet Commonly known as:  LEXAPRO Take 20 mg by mouth daily.   furosemide 40 MG tablet Commonly known as:  LASIX Take 1 tablet (40 mg total) by mouth daily.   levothyroxine 75 MCG tablet Commonly known as:  SYNTHROID, LEVOTHROID Take 75 mcg by mouth daily before breakfast.   magnesium oxide 400 MG tablet Commonly known as:  MAG-OX Take 400 mg by mouth daily. Take for one week   pantoprazole 40 MG tablet Commonly known as:  PROTONIX Take 1 tablet (40 mg total) by mouth daily.   potassium chloride SA 20 MEQ tablet Commonly known as:  K-DUR,KLOR-CON Take 1 tablet (20 mEq total) by mouth 2 (two) times daily.   PROCEL Powd Take 2 scoop by mouth 2 (two) times daily.   traMADol 50 MG tablet Commonly known as:  ULTRAM Take 1 tablet (50 mg total) by mouth every 6 (six) hours as needed for moderate pain.       Immunizations: Immunization History  Administered Date(s) Administered  . Influenza Split 10/27/2010, 10/14/2011  . Influenza Whole 11/09/2006, 10/31/2007, 11/05/2008, 10/05/2009  . Influenza, High Dose Seasonal PF 10/23/2015  . Influenza,inj,Quad PF,36+ Mos 10/30/2012  . Influenza-Unspecified 08/30/2013, 11/20/2013, 10/30/2014  . Pneumococcal Polysaccharide-23 01/08/2002  . Td 01/08/2002  . Zoster 06/01/2007     Physical Exam:  Vitals:   12/01/15 1203  BP: 108/61  Pulse: 77  Resp: 18  Temp: 98 F (36.7 C)   TempSrc: Oral  SpO2: 96%  Weight: 133 lb 4.8 oz (60.5 kg)  Height: 5\' 3"  (1.6 m)   Body mass index is 23.61 kg/m.  General- elderly female, frail and thin built, in no acute distress Head- normocephalic, atraumatic Nose- no maxillary or frontal sinus tenderness, no nasal discharge Throat- moist mucus membrane  Eyes- PERRLA, EOMI, no pallor, no icterus Neck- no cervical lymphadenopathy Cardiovascular- irregular heart rate, no murmur, no leg edema Respiratory- bilateral poor air entry with decreased air movement R> L, no wheeze, no rhonchi, no crackles, no use of accessory muscles, on 2 liters o2 by Bowers Abdomen- bowel sounds present, soft, non tender Musculoskeletal- able to move all 4 extremities, generalized weakness Neurological- alert and oriented to place and year but not to month and person Skin- warm and  dry Psychiatry- normal mood and affect    Labs reviewed: Basic Metabolic Panel:  Recent Labs  16/10/96 0209 11/22/15 0444 11/23/15 0426 11/24/15 0448 11/26/15  NA  --  140 141 140 144  K  --  3.2* 3.7 3.9 4.0  CL  --  102 97* 96*  --   CO2  --  32 34* 35*  --   GLUCOSE  --  76 127* 80  --   BUN  --  18 17 14 17   CREATININE  --  0.97 0.99 0.87 0.7  CALCIUM  --  8.7* 9.2 9.0  --   MG 1.4*  --  1.5*  --   --    Liver Function Tests:  Recent Labs  05/04/15 1227 11/20/15 1219 11/26/15  AST 74* 53* 21  ALT 136* 45 20  ALKPHOS 71 71 70  BILITOT 0.9 3.1*  --   PROT 7.4 7.5  --   ALBUMIN 3.8 3.6  --    No results for input(s): LIPASE, AMYLASE in the last 8760 hours. No results for input(s): AMMONIA in the last 8760 hours. CBC:  Recent Labs  05/06/15 1058  11/05/15 1032 11/20/15 1120 11/21/15 0209 11/22/15 0444  WBC 7.1  < > 5.3 4.6 5.2 4.5  NEUTROABS 3.9  --  2.1  --   --   --   HGB 12.0  < > 14.7 16.0* 14.7 13.9  HCT 37.0  < > 45.2 46.9* 43.7 41.8  MCV 105.3*  < > 113.3* 110.4* 110.9* 110.3*  PLT 252.0  < > 125* 105* 94* 98*  < > = values in this  interval not displayed. Cardiac Enzymes:  Recent Labs  11/20/15 1219 11/20/15 2003 11/21/15 0209  TROPONINI 0.04* 0.05* 0.06*   BNP: Invalid input(s): POCBNP CBG:  Recent Labs  11/20/15 1137  GLUCAP 79    Radiological Exams: Dg Chest 2 View  Result Date: 11/20/2015 CLINICAL DATA:  Per spouse patient has had increased weakness, lethargy, decreased appetite x several weeks. ; HTN; ex smoker EXAM: CHEST  2 VIEW COMPARISON:  Chest CT, 02/07/2014.  Chest radiograph, 12/27/2013. FINDINGS: Cardiac silhouette is mildly enlarged. No mediastinal or hilar masses. There are irregularly thickened interstitial markings, increased when compared to the prior chest radiograph. There also small bilateral pleural effusions and thickening along the fissures. No pneumothorax. Skeletal structures are demineralized. There are compression fractures of the lower thoracic and lumbar spine several treated with previous vertebroplasty. These are stable. IMPRESSION: 1. Findings are consistent with congestive heart failure and interstitial edema superimposed on chronic interstitial lung disease. Electronically Signed   By: Amie Portland M.D.   On: 11/20/2015 13:28   Ct Head Wo Contrast  Result Date: 11/20/2015 CLINICAL DATA:  Increasing weakness and lethargy with decreased appetite for several weeks. Patient fell 3 days ago. EXAM: CT HEAD WITHOUT CONTRAST TECHNIQUE: Contiguous axial images were obtained from the base of the skull through the vertex without intravenous contrast. COMPARISON:  MRI brain 01/06/2011.  CT head 04/29/2004 FINDINGS: Brain: There is no evidence of acute intracranial hemorrhage, mass lesion, brain edema or extra-axial fluid collection. There is stable atrophy with prominence of the ventricles and subarachnoid spaces. Atrophy appears greatest within the right frontal lobe. There is no CT evidence of acute cortical infarction. Mild periventricular white matter disease appears unchanged.  Vascular: Intracranial vascular calcifications noted. Skull: Negative for fracture or focal lesion. Sinuses/Orbits: The visualized paranasal sinuses and mastoid air cells are clear. No orbital abnormalities  are seen. Other: None. IMPRESSION: No acute intracranial findings. Stable atrophy and minimal periventricular white matter disease. Electronically Signed   By: Carey BullocksWilliam  Veazey M.D.   On: 11/20/2015 13:19   [place] Abdomen Limited Ruq  Result Date: 11/20/2015 CLINICAL DATA:  Hyperbilirubinemia. EXAM: US ABDOMEN LIMITED - RIGHT UPPER QUADRANT COMPARISON:  None. FINDINGS: Gallbladder: No gallstones or wall thickening visualized. No sonographic Murphy sign noted by sonographer. Common bile duct: Diameter: 3.7 mm Liver: No focal lesion identified. Within normal limits in parenchymal echogenicity. Moderate right pleural effusion. IMPRESSION: Negative for gallstones.  No biliary obstruction Right pleural effusion Electronically Signed   By: Marlan Palauharles  Clark M.D.   On: 11/20/2015 18:14    Assessment/Plan  Physical deconditioning With generalized weakness. Will have her work with physical therapy and occupational therapy team to help with gait training and muscle strengthening exercises.fall precautions. Skin care. Encourage to be out of bed.   Chronic systolic CHF With recent exacerbation requiring iv diuresis. Continue lasix 40 mg daily, coreg 3.125 mg bid. Breathing stable at present. Continue kcl supplement. Check bmp  afib Rate controlled. Continue digoxin and coreg. Continue eliquis 2.5 mg bid for stroke prophylaxis. Will need cardiology follow up.   Thrombocytopenia No bleed reported. Monitor platelet count  Hypothyroidism Continue levothyroxine 75 mcg daily  Hypomagnesemia Continue magnesium oxide and check mg level  Chronic depression Continue escitalopram current regimen and monitor  Cognitive impairment To provide supportive care, encourage to be out of bed. Has upcoming neurology  appointment. SLP to evaluate  Protein calorie malnutrition Continue procel supplement, monitor weight  Hyperbilirubinemia Asymptomatic, monitor clinically  Chronic pain Continue tramadol on need basis, no changes made  gerd Stable, continue protonix  Pulmonary fibrosis Stable breathing, wean off o2 as tolerated   Goals of care: short term rehabilitation   Labs/tests ordered: cbc, cmp, mg  Family/ staff Communication: reviewed care plan with patient and nursing supervisor    Oneal GroutMAHIMA Clyde Zarrella, MD Internal Medicine [facility] [address] [place], [place] [address] Cell Phone (Monday-Friday 8 am - 5 pm): [phone] On Call: [phone] and follow prompts after 5 pm and on weekends Office Phone: [phone] Office Fax: [phone]

## 2015-12-02 LAB — CBC AND DIFFERENTIAL
HEMATOCRIT: 45 % (ref 36–46)
HEMOGLOBIN: 14.9 g/dL (ref 12.0–16.0)
Platelets: 120 10*3/uL — AB (ref 150–399)
WBC: 5.5 10^3/mL

## 2015-12-04 LAB — BASIC METABOLIC PANEL
BUN: 17 mg/dL (ref 4–21)
CREATININE: 0.5 mg/dL (ref 0.5–1.1)
Glucose: 85 mg/dL
POTASSIUM: 4.3 mmol/L (ref 3.4–5.3)
SODIUM: 141 mmol/L (ref 137–147)

## 2015-12-11 LAB — HEPATIC FUNCTION PANEL
ALK PHOS: 110 U/L (ref 25–125)
ALT: 23 U/L (ref 7–35)
AST: 26 U/L (ref 13–35)
Bilirubin, Total: 1.2 mg/dL

## 2015-12-16 ENCOUNTER — Emergency Department (HOSPITAL_COMMUNITY): Payer: Medicare Other

## 2015-12-16 ENCOUNTER — Emergency Department (HOSPITAL_COMMUNITY)
Admission: EM | Admit: 2015-12-16 | Discharge: 2015-12-17 | Disposition: A | Discharge status: Home / Self Care | Payer: Medicare Other | Attending: Emergency Medicine | Admitting: Emergency Medicine

## 2015-12-16 ENCOUNTER — Encounter (HOSPITAL_COMMUNITY): Payer: Self-pay

## 2015-12-16 DIAGNOSIS — Z87891 Personal history of nicotine dependence: Secondary | ICD-10-CM | POA: Insufficient documentation

## 2015-12-16 DIAGNOSIS — Z79899 Other long term (current) drug therapy: Secondary | ICD-10-CM | POA: Diagnosis not present

## 2015-12-16 DIAGNOSIS — I10 Essential (primary) hypertension: Secondary | ICD-10-CM | POA: Insufficient documentation

## 2015-12-16 DIAGNOSIS — Z7901 Long term (current) use of anticoagulants: Secondary | ICD-10-CM | POA: Diagnosis not present

## 2015-12-16 DIAGNOSIS — Z96659 Presence of unspecified artificial knee joint: Secondary | ICD-10-CM | POA: Insufficient documentation

## 2015-12-16 DIAGNOSIS — E039 Hypothyroidism, unspecified: Secondary | ICD-10-CM | POA: Insufficient documentation

## 2015-12-16 DIAGNOSIS — R29898 Other symptoms and signs involving the musculoskeletal system: Secondary | ICD-10-CM

## 2015-12-16 DIAGNOSIS — M25551 Pain in right hip: Secondary | ICD-10-CM | POA: Diagnosis present

## 2015-12-16 DIAGNOSIS — M6281 Muscle weakness (generalized): Secondary | ICD-10-CM | POA: Insufficient documentation

## 2015-12-16 NOTE — ED Notes (Signed)
Patient's daughter's contact information:   [name] [phone]  Please call with updates

## 2015-12-16 NOTE — ED Triage Notes (Signed)
She reported to staff at [facility]. Today that she is experiencing right hip area soreness. No recent trauma is cited. Pt. Has memory issues and is in no distress upon arrival. EMS state pt. Is staying at the rehab after a [facility] (two weeks ago) [facility].

## 2015-12-16 NOTE — ED Provider Notes (Signed)
[facility]-EMERGENCY DEPT Provider Note   CSN: [id number] Arrival date & time: 12/16/15  1604     History   Chief Complaint Chief Complaint  Patient presents with  . Hip Pain    HPI [name] [name] is a 82 y.o. female.  82 year old Caucasian female past medical history significant for dementia, vertebral fractures, osteoporosis presents [facility] the ED today [facility]. Daughter is at bedside states that she got report from the nurse at the rehabilitation facility that she was complaining of right hip area soreness. She denies any recent trauma. Daughter reports the patient was seen by the facility doctor today that was worried about right leg weakness and numbness that was associated with new onset low back pain that was radiating into her right leg. At this time patient denies any pain in her back. She does reports tenderness to palpation of the right hip intermittently. An x-ray was performed of the facility that showed no acute abnormalities. Patient was recently discharged from the hospital for 2 weeks day for CHF exacerbation and is currently at rehabilitation. The facility provider did neurological exam today and found the patient to have numbness in her right lower extremity. Patient denies any fever, chills, headache, vision changes, lightheadedness, dizziness, chest pain, shortness of breath, abdominal pain, nausea, emesis, urinary symptoms, change in bowel habits.      Past Medical History:  Diagnosis Date  . Anemia   . Anxiety   . Arthritis   . Dementia   . Depression   . Diverticulosis of colon   . History of alcohol abuse   . Hyperlipidemia   . Hypertension   . IPF (idiopathic pulmonary fibrosis) (HCC)   . Irritable bowel syndrome   . OSA (obstructive sleep apnea)   . Osteoporosis   . PMR (polymyalgia rheumatica) (HCC)   . Postherpetic neuralgia   . Spinal stenosis   . Thyroid disease   . Vertebral fracture   . Vitamin D  deficiency     Patient Active Problem List   Diagnosis Date Noted  . Pulmonary edema 11/20/2015  . Acute on chronic systolic CHF (congestive heart failure) (HCC) 11/20/2015  . Hyperbilirubinemia 11/20/2015  . Easy bruising 11/10/2015  . Bilateral leg edema 10/23/2015  . Urinary incontinence 10/23/2015  . PAF (paroxysmal atrial fibrillation) (HCC) 05/27/2015  . Fatigue 06/03/2014  . Hypothyroidism 04/02/2014  . Absolute anemia 01/01/2014  . IPF (idiopathic pulmonary fibrosis) (HCC) 01/01/2014  . OA (osteoarthritis) of knee 07/02/2010  . Memory loss 07/02/2010  . Hyperlipidemia 03/08/2010  . Vitamin D deficiency 10/05/2009  . Obstructive sleep apnea 10/08/2007  . SYNDROME, CHRONIC PAIN 11/16/2006  . DIVERTICULOSIS, COLON 06/06/2006  . Depression 04/30/2006  . Essential hypertension 04/30/2006  . OSTEOPOROSIS 04/30/2006    Past Surgical History:  Procedure Laterality Date  . APPENDECTOMY    . BREAST BIOPSY    . CATARACT EXTRACTION    . DILATION AND CURETTAGE OF UTERUS     x several  . KNEE ARTHROSCOPY  10/2007 and 10/2010   left  . REPLACEMENT TOTAL KNEE  august 2012  . ROTATOR CUFF REPAIR    . VERTEBROPLASTY     x 3    OB History    No data available       Home Medications    Prior to Admission medications   Medication Sig Start Date End Date Taking? Authorizing Provider  apixaban (ELIQUIS) 2.5 MG TABS tablet Take 1 tablet (2.5 mg  total) by mouth 2 (two) times daily. 11/24/15  Yes Clanford Cyndie Mull, MD  carvedilol (COREG) 3.125 MG tablet Take 1 tablet (3.125 mg total) by mouth 2 (two) times daily. 11/24/15  Yes Clanford Cyndie Mull, MD  clobetasol ointment (TEMOVATE) 0.05 % Apply 1 application topically 2 (two) times daily. 11/10/15  Yes Nelwyn Salisbury, MD  digoxin (LANOXIN) 0.125 MG tablet Take 1 tablet (0.125 mg total) by mouth daily. 11/25/15  Yes Clanford Cyndie Mull, MD  escitalopram (LEXAPRO) 20 MG tablet Take 20 mg by mouth daily.   Yes Historical Provider, MD   furosemide (LASIX) 40 MG tablet Take 1 tablet (40 mg total) by mouth daily. 11/24/15  Yes Clanford Cyndie Mull, MD  levothyroxine (SYNTHROID, LEVOTHROID) 75 MCG tablet Take 75 mcg by mouth daily before breakfast.   Yes Historical Provider, MD  magnesium oxide (MAG-OX) 400 MG tablet Take 400 mg by mouth daily. Take for one week   Yes Historical Provider, MD  pantoprazole (PROTONIX) 40 MG tablet Take 1 tablet (40 mg total) by mouth daily. 05/15/15  Yes Azalee Course, PA  potassium chloride SA (K-DUR,KLOR-CON) 20 MEQ tablet Take 1 tablet (20 mEq total) by mouth 2 (two) times daily. 11/24/15  Yes Clanford Cyndie Mull, MD  Protein (PROCEL) POWD Take 2 scoop by mouth 2 (two) times daily.   Yes Historical Provider, MD  traMADol (ULTRAM) 50 MG tablet Take 1 tablet (50 mg total) by mouth every 6 (six) hours as needed for moderate pain. 11/24/15  Yes Clanford Cyndie Mull, MD    Family History Family History  Problem Relation Age of Onset  . Stroke Father 62  . Heart attack Father   . Coronary artery disease Father 56  . Alcohol abuse Mother   . Breast cancer Sister   . Heart disease Sister   . Colon cancer Neg Hx     Social History Social History  Substance Use Topics  . Smoking status: Former Smoker    Packs/day: 1.00    Years: 20.00    Types: Cigarettes    Quit date: 01/25/1988  . Smokeless tobacco: Never Used  . Alcohol use 0.0 oz/week     Comment: 2 glasses every day     Allergies   Celecoxib; Clindamycin hcl; Rofecoxib; and Sertraline hcl   Review of Systems Review of Systems  Constitutional: Negative for chills and fever.  HENT: Negative for congestion.   Eyes: Negative for visual disturbance.  Respiratory: Negative for cough and shortness of breath.   Cardiovascular: Negative for chest pain and palpitations.  Gastrointestinal: Negative for abdominal pain, diarrhea, nausea and vomiting.  Genitourinary: Negative for dysuria, flank pain and urgency.  Musculoskeletal: Positive for  arthralgias. Negative for back pain and gait problem.  Skin: Negative.   Neurological: Positive for numbness. Negative for dizziness, syncope, weakness, light-headedness and headaches.  All other systems reviewed and are negative.    Physical Exam Updated Vital Signs BP 110/66 (BP Location: Left Arm)   Pulse 85   Temp 98.1 F (36.7 C) (Oral)   Resp 20   SpO2 97%   Physical Exam  Constitutional: She is oriented to person, place, and time. She appears well-developed and well-nourished. No distress.  HENT:  Head: Normocephalic and atraumatic.  Mouth/Throat: Oropharynx is clear and moist.  Eyes: Conjunctivae are normal. Pupils are equal, round, and reactive to light. Right eye exhibits no discharge. Left eye exhibits no discharge. No scleral icterus.  Neck: Normal range of motion. Neck supple. No thyromegaly  present.  Cardiovascular: Normal rate, regular rhythm, normal heart sounds and intact distal pulses.  Exam reveals no gallop and no friction rub.   No murmur heard. Pulmonary/Chest: Effort normal and breath sounds normal. No respiratory distress.  Abdominal: Soft. Bowel sounds are normal. She exhibits no distension. There is no tenderness. There is no rebound and no guarding.  Musculoskeletal: Normal range of motion. She exhibits no edema, tenderness or deformity.  Patient has no T-spine or L-spine midline tenderness. No paraspinal tenderness. Negative straight leg raise test on the right.  Patient denies any pain to palpation of the right hip joint. No erythema, edema, ecchymosis, deformity, crepitus noted. Patient with full range of motion. Strength 4 out of 5 in the right lower extremity. Strength 5 out of 5 in the left lower extremities. Sensation is intact bilaterally. Cap refills normal. DP pulses are 2+ bilaterally. No lower extremity edema noted. No calf tenderness.  Lymphadenopathy:    She has no cervical adenopathy.  Neurological: She is alert and oriented to person, place,  and time.  The patient is alert, attentive, and oriented x 3. Speech is clear. Cranial nerve II-VII grossly intact. Negative pronator drift. Sensation intact. Strength 5/5 in all extremities except right lower extremity which is 4/5. Reflexes 2+ and symmetric at biceps, triceps, knees, and ankles. Rapid alternating movement and fine finger movements intact. Unable to asses gait and romberg due to poor ambulation and dementia. Patient with sensation to sharp and dull of lower extremities bilaterally.   Skin: Skin is warm and dry. Capillary refill takes less than 2 seconds. No erythema.  Nursing note and vitals reviewed.    ED Treatments / Results  Labs (all labs ordered are listed, but only abnormal results are displayed) Labs Reviewed - No data to display  EKG  EKG Interpretation None       Radiology No results found.  Procedures Procedures (including critical care time)  Medications Ordered in ED Medications - No data to display   Initial Impression / Assessment and Plan / ED Course  I have reviewed the triage vital signs and the nursing notes.  Pertinent labs & imaging results that were available during my care of the patient were reviewed by me and considered in my medical decision making (see chart for details).  Clinical Course   Patient presents to ED by EMS from rehabilitation facility with right hip pain. Patient had x-ray at facility that was unremarkable. Dr at facility was concerned about right lower extremity weakness and new onset lower back pain given patient's history of back surgery. On my exam patient does have weakness on the right lower extremity. Sensation is intact to bilateral lower extremities. Patient has no midline L-spine tenderness. She is in NAD and hemodynamically stable this time. No erythema or edema of the right hip. No calf tenderness or lower extremities edema. Low suspicion for DVT at this time. MRI of lumbar spine was ordered given patient  history of vertebral sugery. Patient was seen and examined by Dr. Julieanne Mansonegler who agrees with the above plan. MRI of lumbar spine is pending. Care of plan hand off to Dr. Rush Landmarkegeler. Likely discharge home with follow up with PCP. Spoke with daughter who is agreeable to the above plan.   Final Clinical Impressions(s) / ED Diagnoses   Final diagnoses:  Right leg weakness    New Prescriptions New Prescriptions   No medications on file     Rise MuKenneth T Graciemae Delisle, Cordelia Poche-C 12/16/15 2135    Cristal Deerhristopher  Cleatis Polka, MD 12/18/15 1125

## 2015-12-16 NOTE — Discharge Instructions (Signed)
Please follow-up with your PCP for further management of your low back pain and right leg pain with weakness. Please follow-up with your back doctor. If symptoms worsen or you began having any loss of control of bowel or bladder, please return to the nearest emergency department.

## 2015-12-16 NOTE — ED Notes (Signed)
Pt at MRI

## 2015-12-30 ENCOUNTER — Encounter: Payer: Self-pay | Admitting: Adult Health

## 2015-12-30 ENCOUNTER — Non-Acute Institutional Stay (SKILLED_NURSING_FACILITY): Payer: Medicare Other | Admitting: Adult Health

## 2015-12-30 DIAGNOSIS — J84112 Idiopathic pulmonary fibrosis: Secondary | ICD-10-CM

## 2015-12-30 DIAGNOSIS — E038 Other specified hypothyroidism: Secondary | ICD-10-CM | POA: Diagnosis not present

## 2015-12-30 DIAGNOSIS — F329 Major depressive disorder, single episode, unspecified: Secondary | ICD-10-CM

## 2015-12-30 DIAGNOSIS — I5022 Chronic systolic (congestive) heart failure: Secondary | ICD-10-CM | POA: Diagnosis not present

## 2015-12-30 DIAGNOSIS — R531 Weakness: Secondary | ICD-10-CM | POA: Diagnosis not present

## 2015-12-30 DIAGNOSIS — K219 Gastro-esophageal reflux disease without esophagitis: Secondary | ICD-10-CM

## 2015-12-30 DIAGNOSIS — E876 Hypokalemia: Secondary | ICD-10-CM | POA: Diagnosis not present

## 2015-12-30 DIAGNOSIS — I480 Paroxysmal atrial fibrillation: Secondary | ICD-10-CM

## 2015-12-30 NOTE — Progress Notes (Signed)
Patient ID: [name] [name], female   DOB: [birth year], 82 y.o.   MRN: [id number]    DATE:  12/30/2015   MRN:  [id number]  BIRTHDAY: Jun 12, 1930  Facility:  Nursing Home Location:  [facility]  Nursing Home Room Number: 1203-P  LEVEL OF CARE:  SNF [phone])  Contact Information    Name Relation Home Work Mobile   [place] [phone]     Burnet,Broughton Daughter [phone]     Jamse Arn Daughter [phone]         Code Status History    Date Active Date Inactive Code Status Order ID Comments User Context   11/20/2015  4:28 PM 11/24/2015  5:33 PM Full Code [id number]  Starleen Arms, MD ED       Chief Complaint  Patient presents with  . Discharge Note    HISTORY OF PRESENT ILLNESS:  This is an 82 year old female who Is for discharge home with home health PT, OT, CNA, nursing and speech therapy. DME:  Semi-electric wheelchair, hospital bed, standard wheelchair with elevating leg rests, brake extensions, anti-tippers and cushion.  She  has been admitted to [facility] on 11/24/15 from [facility] hospitalization 11/20/15 - 11/24/15. She has stopped taking Lasix for 3 weeks due to urinary incontinence and came to the hospital for generalized weakness. She was treated for Acute on chronic systolic CHF with IV Lasix then restarted PO Lasix. Cardiology was consulted and was started on Digoxin and low dose Eliquis.  She was on Eliquis before but was stopped and changed to ASA 81 mg due to easy bruising, recurrent epistaxis and skin bleed. She is not on ACEI/ARB due to soft BP. She was apparently taking Coreg and Metoprolol at the same time. She was kept on Coreg.  Patient was admitted to this facility for short-term rehabilitation after the patient's recent hospitalization.  Patient has completed SNF rehabilitation and therapy has cleared the patient for discharge.   PAST MEDICAL HISTORY:  Past Medical History:  Diagnosis Date  . Anemia     . Anxiety   . Arthritis   . Dementia   . Depression   . Diverticulosis of colon   . History of alcohol abuse   . Hyperlipidemia   . Hypertension   . IPF (idiopathic pulmonary fibrosis) (HCC)   . Irritable bowel syndrome   . OSA (obstructive sleep apnea)   . Osteoporosis   . PMR (polymyalgia rheumatica) (HCC)   . Postherpetic neuralgia   . Spinal stenosis   . Thyroid disease   . Vertebral fracture   . Vitamin D deficiency      CURRENT MEDICATIONS: Reviewed  Patient's Medications  New Prescriptions   No medications on file  Previous Medications   APIXABAN (ELIQUIS) 2.5 MG TABS TABLET    Take 1 tablet (2.5 mg total) by mouth 2 (two) times daily.   CARVEDILOL (COREG) 3.125 MG TABLET    Take 1 tablet (3.125 mg total) by mouth 2 (two) times daily.   CLOBETASOL OINTMENT (TEMOVATE) 0.05 %    Apply 1 application topically 2 (two) times daily.   DIGOXIN (LANOXIN) 0.125 MG TABLET    Take 1 tablet (0.125 mg total) by mouth daily.   ESCITALOPRAM (LEXAPRO) 20 MG TABLET    Take 20 mg by mouth daily.    FUROSEMIDE (LASIX) 40 MG TABLET    Take 1 tablet (40 mg total) by mouth daily.   GABAPENTIN (NEURONTIN) 100 MG CAPSULE    Take  100 mg by mouth daily. 8AM   LEVOTHYROXINE (SYNTHROID, LEVOTHROID) 75 MCG TABLET    Take 75 mcg by mouth daily before breakfast.   MENTHOL (ICY HOT) 5 % PTCH    Apply 1 patch topically daily. Apply to low back qam, remove at qhs   NUTRITIONAL SUPPLEMENT LIQD    Take 120 mLs by mouth 2 (two) times daily. Med[employer]   PANTOPRAZOLE (PROTONIX) 40 MG TABLET    Take 1 tablet (40 mg total) by mouth daily.   POTASSIUM CHLORIDE SA (K-DUR,KLOR-CON) 20 MEQ TABLET    Take 1 tablet (20 mEq total) by mouth 2 (two) times daily.   PROTEIN (PROCEL) POWD    Take 2 scoop by mouth 2 (two) times daily.   TRAMADOL (ULTRAM) 50 MG TABLET    Take 1 tablet (50 mg total) by mouth every 6 (six) hours as needed for moderate pain.  Modified Medications   No medications on file  Discontinued  Medications   MAGNESIUM OXIDE (MAG-OX) 400 MG TABLET    Take 400 mg by mouth daily. Take for one week     Allergies  Allergen Reactions  . Celecoxib     REACTION: unspecified  . Clindamycin Hcl Diarrhea  . Rofecoxib     REACTION: unspecified  . Sertraline Hcl     REACTION: unspecified     REVIEW OF SYSTEMS:  GENERAL: no change in appetite, no fatigue, no weight changes, no fever, chills or weakness EYES: Denies change in vision, dry eyes, eye pain, itching or discharge EARS: Denies change in hearing, ringing in ears, or earache NOSE: Denies nasal congestion or epistaxis MOUTH and THROAT: Denies oral discomfort, gingival pain or bleeding, pain from teeth or hoarseness   RESPIRATORY: no cough, SOB, DOE, wheezing, hemoptysis CARDIAC: no chest pain, edema or palpitations GI: no abdominal pain, diarrhea, constipation, heart burn, nausea or vomiting GU: Denies dysuria, frequency, hematuria, incontinence, or discharge PSYCHIATRIC: Denies feeling of depression or anxiety. No report of hallucinations, insomnia, paranoia, or agitation   PHYSICAL EXAMINATION  GENERAL APPEARANCE: Well nourished. In no acute distress. Normal body habitus SKIN:  Skin is warm and dry.  HEAD: Normal in size and contour. No evidence of trauma EYES: Lids open and close normally. No blepharitis, entropion or ectropion. PERRL. Conjunctivae are clear and sclerae are white. Lenses are without opacity EARS: Pinnae are normal. Patient hears normal voice tunes of the examiner MOUTH and THROAT: Lips are without lesions. Oral mucosa is moist and without lesions. Tongue is normal in shape, size, and color and without lesions NECK: supple, trachea midline, no neck masses, no thyroid tenderness, no thyromegaly LYMPHATICS: no LAN in the neck, no supraclavicular LAN RESPIRATORY: breathing is even & unlabored, BS CTAB CARDIAC: irregularly irregular, no murmur,no extra heart sounds, no edema GI: abdomen soft, normal BS, no  masses, no tenderness, no hepatomegaly, no splenomegaly EXTREMITIES:  Able to move X 4 extremities PSYCHIATRIC: Alert to place and person, disoriented to time. Affect and behavior are appropriate  LABS/RADIOLOGY: Labs reviewed: Basic Metabolic Panel:  Recent Labs  96/04/5408/28/17 0209 11/22/15 0444 11/23/15 0426 11/24/15 0448 11/26/15 12/04/15  NA  --  140 141 140 144 141  K  --  3.2* 3.7 3.9 4.0 4.3  CL  --  102 97* 96*  --   --   CO2  --  32 34* 35*  --   --   GLUCOSE  --  76 127* 80  --   --   BUN  --  18 17 14 17 17   CREATININE  --  0.97 0.99 0.87 0.7 0.5  CALCIUM  --  8.7* 9.2 9.0  --   --   MG 1.4*  --  1.5*  --   --   --    Liver Function Tests:  Recent Labs  05/04/15 1227 11/20/15 1219 11/26/15 12/11/15  AST 74* 53* 21 26  ALT 136* 45 20 23  ALKPHOS 71 71 70 110  BILITOT 0.9 3.1*  --   --   PROT 7.4 7.5  --   --   ALBUMIN 3.8 3.6  --   --    CBC:  Recent Labs  05/06/15 1058  11/05/15 1032 11/20/15 1120 11/21/15 0209 11/22/15 0444 12/02/15  WBC 7.1  < > 5.3 4.6 5.2 4.5 5.5  NEUTROABS 3.9  --  2.1  --   --   --   --   HGB 12.0  < > 14.7 16.0* 14.7 13.9 14.9  HCT 37.0  < > 45.2 46.9* 43.7 41.8 45  MCV 105.3*  < > 113.3* 110.4* 110.9* 110.3*  --   PLT 252.0  < > 125* 105* 94* 98* 120*  < > = values in this interval not displayed.  Cardiac Enzymes:  Recent Labs  11/20/15 1219 11/20/15 2003 11/21/15 0209  TROPONINI 0.04* 0.05* 0.06*   CBG:  Recent Labs  11/20/15 1137  GLUCAP 79      Mr Lumbar Spine Wo Contrast  Result Date: 12/16/2015 CLINICAL DATA:  RIGHT leg weakness, hip soreness. Recent hospitalization for CHF. History of lumbar vertebroplasty. EXAM: MRI LUMBAR SPINE WITHOUT CONTRAST TECHNIQUE: Multiplanar, multisequence MR imaging of the lumbar spine was performed. No intravenous contrast was administered. COMPARISON:  MRI of lumbar spine April 07, 2010 FINDINGS: SEGMENTATION: For the purposes of this report, the last well-formed  intervertebral disc will be described as L5-S1. ALIGNMENT: Maintenance of the lumbar lordosis. Stable grade 1 (3 mm) L2-3 retrolisthesis and grade 1 (4 mm) L5-S1 anterolisthesis. No spondylolysis. VERTEBRAE:New nonacute severe T10 compression fracture. Moderate T12 compression fracture status post cement augmentation. Stable moderate old L1 compression fracture. L2 is intact. Stable mild to moderate L3 and L4 compression fractures, status post cement augmentation. New nonacute mild L1 compression fracture. No STIR signal abnormality to suggest acute osseous process in the lumbar spine. Bright STIR signal within the sacrococcygeal junction with low T1 signal equivocal for acute injury. CONUS MEDULLARIS: Conus medullaris terminates at T12-L1 and demonstrates normal morphology and signal characteristics. Cauda equina is normal. PARASPINAL AND SOFT TISSUES: Included prevertebral and paraspinal soft tissues are nonacute. Severe paraspinal muscle atrophy. Partially imaged distended gallbladder. Ectatic infrarenal aorta without aneurysm. DISC LEVELS: L1-2: Moderate broad-based disc bulge, cold extrusion with partial re- absorption. Mild facet arthropathy and ligamentum flavum redundancy and without canal stenosis or neural foraminal narrowing. L2-3: Retrolisthesis. New RIGHT subarticular disc protrusion with endplate spurring. Mild facet arthropathy and ligamentum flavum redundancy. Mild canal stenosis and effacement RIGHT lateral recess which likely affects the traversing RIGHT L3 nerve. Mild to moderate RIGHT neural foraminal narrowing. L3-4: Small broad-based disc bulge in RIGHT extra foraminal disc protrusion which could affect the exited RIGHT L4 nerve. Mild facet arthropathy and ligamentum flavum redundancy. Mild canal stenosis. Moderate RIGHT, mild to moderate LEFT neural foraminal narrowing. L4-5: Small broad-based disc bulge. Moderate facet arthropathy and ligamentum flavum redundancy. Moderate to severe canal  stenosis, progressed from prior examination. Mild bilateral neural foraminal narrowing. L5-S1: Anterolisthesis. Small broad-based bulge. Severe facet arthropathy with  ligamentum flavum redundancy. Moderate canal stenosis. Moderate to severe RIGHT, mild LEFT neural foraminal narrowing. IMPRESSION: Old compression fractures of T10 through L1 and L3 through L5, status post multilevel cement augmentation. No acute fracture. Stable grade 1 L2-3 retrolisthesis and grade 1 L5-S1 anterolisthesis without spondylolysis. Moderate to severe canal stenosis at L4-5, moderate at L5-S1. Neural foraminal narrowing L2-3 through L5-S1: Moderate to severe on the RIGHT at L5-S1. Equivocal findings for sacrococcygeal stress fracture, incompletely characterized. Electronically Signed   By: Awilda Metroourtnay  Bloomer M.D.   On: 12/16/2015 21:59    ASSESSMENT/PLAN:  Generalized weakness - for Home health Speech Therapy, Nursing, CNA, PT and OT, for therapeutic strengthening exercises; fall precaution  Chronic systolic CHF - EF 25-30% ; Cardiology was consulted and was given IV Lasix; continue Lasix 40 mg 1 tab PO Q D, continue Coreg 3.125 mg 1 tab PO BID and Digoxin 0.125 mg 1 tab PO Q D; follow-up with cardiology, Dr. Donato SchultzMark Skains; check  Hypomagnesemia - Mg 1.7 ; start Magnesium Oxide 400 mg 1 tab PO Q D X 1 week; Mg level in 1 week  Depression - mood is stable; continue Lexapro 20 mg 1 tab PO Q D  GERD - continue Protonix 40 mg 1 tab PO Q D  Hyperbilirubinemia -  resolved  Atrial fibrillation - rate-controlled; continue Eliquis 2.5 mg 1 tab PO BID, Coreg 3.125 mg 1 tab PO BID and Digoxin 0.125 mg 1 tab PO Q D  Idiopathic pulmonary fibrosis - follows-up with Dr. Kendrick FriesMcQuaid  Hypothyroidism - continue Synthroid 75 mcg 1 tab PO Q D Lab Results  Component Value Date   TSH 3.95 11/26/2015   Hypokalemia - continue KCL ER 20 meq 1 tab PO BID; check BMP Lab Results  Component Value Date   K 4.3 12/04/2015   Thrombocytopenia - no  bruising nor bleeding noted; check CBC Lab Results  Component Value Date   PLT 120 (A) 12/02/2015      I have filled out patient's discharge paperwork and written prescriptions.  Patient will receive home health PT, OT, ST, Nursing and CNA.  DME provided:  Semi-electric wheelchair, hospital bed, standard wheelchair with elevating leg rests, brake extensions, anti-tippers and cushion  Total discharge time: Greater than 30 minutes Greater than 50% of time was spent on counseling and coordination of care with the patient.   Discharge time involved coordination of the discharge process with social worker, nursing staff and therapy department. Medical justification for home health services/DME verified.     Kenard GowerMonina Medina-Vargas, NP [facility] [phone]

## 2015-12-31 ENCOUNTER — Ambulatory Visit: Payer: Medicare Other | Admitting: Cardiology

## 2016-01-05 ENCOUNTER — Telehealth: Payer: Self-pay | Admitting: Family Medicine

## 2016-01-05 NOTE — Telephone Encounter (Signed)
[name] BergamoKeicia with [facility] would like Dr Clent RidgesFry to know they are starting home health on pt today

## 2016-01-06 ENCOUNTER — Telehealth: Payer: Self-pay | Admitting: Family Medicine

## 2016-01-06 NOTE — Telephone Encounter (Signed)
Per Dr. Clent RidgesFry okay to approve nurse visits may draw a magnesium level any day this week.

## 2016-01-06 NOTE — Telephone Encounter (Signed)
I called Vernona RiegerLaura and left a voice message with below information.

## 2016-01-06 NOTE — Telephone Encounter (Signed)
[name] [name] needs verbal order for home health  nurse for twice a wk for 4 wks and then  once a wk for 4 wk and 2 prn visit.[name] [name] would like a visit today Pt daughter would like [facility] to draw magnesium level. Pt was on magnesium oxide . [facility] would like to know if md approve when should that draw the lab

## 2016-01-08 ENCOUNTER — Telehealth: Payer: Self-pay | Admitting: Family Medicine

## 2016-01-08 NOTE — Telephone Encounter (Signed)
° ° °  Mark with [facility] a PT  called to ask for verbal orders home health PT  This started yesterday   1 time a week for 1 week   3 times a week for 4 weeks

## 2016-01-08 NOTE — Telephone Encounter (Signed)
I left a voice message with verbal order for BedfordMark with home care.

## 2016-01-11 ENCOUNTER — Encounter: Payer: Self-pay | Admitting: Family Medicine

## 2016-01-11 ENCOUNTER — Ambulatory Visit (INDEPENDENT_AMBULATORY_CARE_PROVIDER_SITE_OTHER): Payer: Medicare Other | Admitting: Family Medicine

## 2016-01-11 VITALS — BP 118/83 | HR 77 | Temp 97.8°F | Ht 63.0 in | Wt 116.0 lb

## 2016-01-11 DIAGNOSIS — J84112 Idiopathic pulmonary fibrosis: Secondary | ICD-10-CM

## 2016-01-11 DIAGNOSIS — Z23 Encounter for immunization: Secondary | ICD-10-CM | POA: Diagnosis not present

## 2016-01-11 DIAGNOSIS — I10 Essential (primary) hypertension: Secondary | ICD-10-CM | POA: Diagnosis not present

## 2016-01-11 DIAGNOSIS — R600 Localized edema: Secondary | ICD-10-CM

## 2016-01-11 DIAGNOSIS — I480 Paroxysmal atrial fibrillation: Secondary | ICD-10-CM

## 2016-01-11 DIAGNOSIS — R413 Other amnesia: Secondary | ICD-10-CM | POA: Diagnosis not present

## 2016-01-11 DIAGNOSIS — I5023 Acute on chronic systolic (congestive) heart failure: Secondary | ICD-10-CM | POA: Diagnosis not present

## 2016-01-11 NOTE — Addendum Note (Signed)
Addended by: Aniceto BossNIMMONS, Giulia Hickey A on: 01/11/2016 12:20 PM   Modules accepted: Orders

## 2016-01-11 NOTE — Progress Notes (Signed)
   Subjective:    Patient ID: [name] [name], female    DOB: 03/17/1930, 82 y.o.   MRN: [id number]  HPI Here to follow up a hospital stay from 11-20-15 to 11-24-15 for acute on chronic CHF, and then [facility] until [facility]30-15. She is getting home health nursing visits, as well as PT, OT, and ST. She has been doing very well. No chest pain or SOB. No ankle swelling.   Review of Systems  Constitutional: Negative.   Respiratory: Negative.   Cardiovascular: Negative.   Gastrointestinal: Negative.   Neurological: Negative.        Objective:   Physical Exam  Constitutional: She is oriented to person, place, and time.  In a wheelchair, alert, smiling   Neck: No thyromegaly present.  Cardiovascular: Normal rate, normal heart sounds and intact distal pulses.   Irregular rhythm   Pulmonary/Chest: Effort normal. No respiratory distress. She has no wheezes. She has no rales.  Dry crackles at both posterior bases   Musculoskeletal: She exhibits no edema.  Lymphadenopathy:    She has no cervical adenopathy.  Neurological: She is alert and oriented to person, place, and time.          Assessment & Plan:  She seems to be doing well after a bout of CHF. She is getting therapy as above. She is taking 40 mg a Lasix once daily and this seems to be an appropriate dosage for her. She is back on Eliquis for now. She is scheduled to see Dr. Karel JarvisAquino on 01-22-16 for memory loss, to see Dr. Anne FuSkains on 02-04-16 for CHF and atrial fib, and to see Dr. Kendrick FriesMcQuaid for pulmonary care on 02-24-16.  Nelwyn SalisburyFRY,STEPHEN A, MD

## 2016-01-11 NOTE — Progress Notes (Signed)
Pre visit review using our clinic review tool, if applicable. No additional management support is needed unless otherwise documented below in the visit note. 

## 2016-01-12 ENCOUNTER — Telehealth: Payer: Self-pay | Admitting: Family Medicine

## 2016-01-12 DIAGNOSIS — I5023 Acute on chronic systolic (congestive) heart failure: Secondary | ICD-10-CM

## 2016-01-12 NOTE — Telephone Encounter (Signed)
Will check with Dr. Clent RidgesFry

## 2016-01-12 NOTE — Telephone Encounter (Signed)
[name] [name] is call to see if we received lab report pt magnesium level is 2 points under normal range. Tffany will have report refax

## 2016-01-12 NOTE — Telephone Encounter (Signed)
The fax in md folder

## 2016-01-13 NOTE — Telephone Encounter (Signed)
I spoke with daughter [name] and went over below information, also I updated medication chart.

## 2016-01-13 NOTE — Telephone Encounter (Signed)
Tell them to start her on OTC magnesium 400 mg tablets to take 2 tabs daily. We will recheck a level in 30 days

## 2016-01-14 NOTE — Telephone Encounter (Signed)
Tiffany with [facility] is calling to check the status of the magnesium request she stated that the pts husband ask that she give the office a call to check to see what is going on.  I let her know the below msg.

## 2016-01-15 ENCOUNTER — Telehealth: Payer: Self-pay | Admitting: Family Medicine

## 2016-01-15 NOTE — Telephone Encounter (Signed)
° ° ° °  Irving BurtonEmily from [facility] call to ask for verbal orders for Cognitive Care Speech therapy ..  1 time a week for 2 weeks 2 times a week for 4 weeks    [phone]

## 2016-01-15 NOTE — Telephone Encounter (Signed)
Per Dr Clent RidgesFry, okay. I spoke with Irving BurtonEmily and gave verbal order for speech therapy.

## 2016-01-21 ENCOUNTER — Encounter: Payer: Self-pay | Admitting: Family Medicine

## 2016-01-22 ENCOUNTER — Encounter: Payer: Self-pay | Admitting: Neurology

## 2016-01-22 ENCOUNTER — Ambulatory Visit (INDEPENDENT_AMBULATORY_CARE_PROVIDER_SITE_OTHER): Payer: Medicare Other | Admitting: Neurology

## 2016-01-22 VITALS — BP 100/62 | HR 70 | Ht 63.0 in

## 2016-01-22 DIAGNOSIS — F03A0 Unspecified dementia, mild, without behavioral disturbance, psychotic disturbance, mood disturbance, and anxiety: Secondary | ICD-10-CM

## 2016-01-22 DIAGNOSIS — F0390 Unspecified dementia without behavioral disturbance: Secondary | ICD-10-CM | POA: Diagnosis not present

## 2016-01-22 MED ORDER — DONEPEZIL HCL 10 MG PO TABS: ORAL_TABLET | ORAL | 11 refills | Status: DC

## 2016-01-22 NOTE — Patient Instructions (Signed)
1. Start Aricept 10mg : take 1/2 tablet daily for 1 month, then increase to 1 tablet daily 2. Proceed with Neuropsychological evaluation as planned 3. Physical exercise and brain stimulation exercises are important for brain health 4. Follow-up in 6 months

## 2016-01-22 NOTE — Progress Notes (Signed)
NEUROLOGY CONSULTATION NOTE  [name] [name] MRN: [id number] DOB: [birth year]  Referring provider: Dr. Gershon Crane Primary care provider: Dr. Gershon Crane  Reason for consult:  Worsening dementia  Dear Dr Clent Ridges:  Thank you for your kind referral of [name] [name] for consultation of the above symptoms. Although her history is well known to you, please allow me to reiterate it for the purpose of our medical record. The patient was accompanied to the clinic by her husband and daughter who also provide collateral information. Records and images were personally reviewed where available.  HISTORY OF PRESENT ILLNESS: This is a pleasant 82 year old right-handed woman with a history of hypertension, hyperlipidemia, PMR, alcohol abuse, anxiety, depression, presenting for worsening memory. She had previously seen neurologist Dr. 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 He recommended further psychologic and psychiatric consultation. She presents today describing her memory as "shot to hell." She does not remember as well as she wishes, and does not recall what was told to her a minute ago. Her husband and daughter started noticing memory changes 3-4 years ago, but worsened recently with a lot of confusion. Her husband reports yesterday was "the worst day," she kept thinking her other daughter would be there and asked about this repeatedly. Last night they had dinner brought in and she kept asking where they were going, she thought they were going to meet someone. She is hard to convince otherwise. There is some paranoia when they try to explain this to her, she would become confrontational. She has become more stubborn, going to bed is a chore, she would argue to stay on her sofa. She is usually very "up" and clever in conversations, well-liked by other residents at their retirement  home, but they note the past 6 months she has lost interest in things she used to like doing, such as decorating and reading. Her husband administers her medications. She used to be the best Biomedical engineer and in charge of bills, but her husband has taken over. She needs help with dressing and bathing.She has had urinary incontinence the past 6 months and wears adult diapers but has difficulty with this.  They have a new CNA, which has caused even more confusion. They are unsure if she has hallucinations, one time she was confused about somebody coming to see them, one time she thought her other daughter was in the kitchen and she said she was at [facility] (she had not been there).   She denies any headaches, dizziness, diplopia, dysarthria, dysphagia, neck/back pain, focal numbness/tingling/weakness, bowel/bladder dysfunction. No anosmia, tremors, no falls. She denies any significant head injuries. She drinks 1-2 glasses of wine at night. Her mother and sister had dementia. Family is concerned about weight loss, she is not eating so much. She has PT, OT, and speech therapy, but refuses to participate. She states she likes to be alone and does not like to socialize.   Laboratory Data: Lab Results  Component Value Date   WBC 5.5 12/02/2015   HGB 14.9 12/02/2015   HCT 45 12/02/2015   MCV 110.3 (H) 11/22/2015   PLT 120 (A) 12/02/2015     Chemistry      Component Value Date/Time   NA 141 12/04/2015   K 4.3 12/04/2015   CL 96 (L) 11/24/2015 0448   CO2 35 (H) 11/24/2015 0448   BUN 17 12/04/2015   CREATININE  0.5 12/04/2015   CREATININE 0.87 11/24/2015 0448   GLU 85 12/04/2015      Component Value Date/Time   CALCIUM 9.0 11/24/2015 0448   ALKPHOS 110 12/11/2015   AST 26 12/11/2015   ALT 23 12/11/2015   BILITOT 3.1 (H) 11/20/2015 1219     Lab Results  Component Value Date   TSH 3.95 11/26/2015   Lab Results  Component Value Date   VITAMINB12 405 05/12/2015    PAST MEDICAL  HISTORY: Past Medical History:  Diagnosis Date  . Anemia   . Anxiety   . Arthritis   . Dementia   . Depression   . Diverticulosis of colon   . History of alcohol abuse   . Hyperlipidemia   . Hypertension   . IPF (idiopathic pulmonary fibrosis) (HCC)   . Irritable bowel syndrome   . OSA (obstructive sleep apnea)   . Osteoporosis   . PMR (polymyalgia rheumatica) (HCC)   . Postherpetic neuralgia   . Spinal stenosis   . Thyroid disease   . Vertebral fracture   . Vitamin D deficiency     PAST SURGICAL HISTORY: Past Surgical History:  Procedure Laterality Date  . APPENDECTOMY    . BREAST BIOPSY    . CATARACT EXTRACTION    . DILATION AND CURETTAGE OF UTERUS     x several  . KNEE ARTHROSCOPY  10/2007 and 10/2010   left  . REPLACEMENT TOTAL KNEE  august 2012  . ROTATOR CUFF REPAIR    . VERTEBROPLASTY     x 3    MEDICATIONS: Current Outpatient Prescriptions on File Prior to Visit  Medication Sig Dispense Refill  . apixaban (ELIQUIS) 2.5 MG TABS tablet Take 1 tablet (2.5 mg total) by mouth 2 (two) times daily. 60 tablet   . carvedilol (COREG) 3.125 MG tablet Take 1 tablet (3.125 mg total) by mouth 2 (two) times daily.    . Cholecalciferol (VITAMIN D PO) Take 50,000 Units by mouth once a week.    . clobetasol ointment (TEMOVATE) 0.05 % Apply 1 application topically 2 (two) times daily. (Patient not taking: Reported on 01/11/2016) 60 g 5  . digoxin (LANOXIN) 0.125 MG tablet Take 1 tablet (0.125 mg total) by mouth daily.    Marland Kitchen escitalopram (LEXAPRO) 20 MG tablet Take 20 mg by mouth daily.     . ferrous sulfate 325 (65 FE) MG tablet Take 325 mg by mouth 2 (two) times daily with a meal.    . furosemide (LASIX) 40 MG tablet Take 1 tablet (40 mg total) by mouth daily. 30 tablet   . gabapentin (NEURONTIN) 100 MG capsule Take 100 mg by mouth daily. 8AM    . levothyroxine (SYNTHROID, LEVOTHROID) 75 MCG tablet Take 75 mcg by mouth daily before breakfast.    . Magnesium 400 MG CAPS Take  800 mg by mouth daily.    . Menthol (ICY HOT) 5 % PTCH Apply 1 patch topically daily. Apply to low back qam, remove at qhs    . NUTRITIONAL SUPPLEMENT LIQD Take 120 mLs by mouth 2 (two) times daily. MedPass    . pantoprazole (PROTONIX) 40 MG tablet Take 1 tablet (40 mg total) by mouth daily. 90 tablet 3  . potassium chloride SA (K-DUR,KLOR-CON) 20 MEQ tablet Take 1 tablet (20 mEq total) by mouth 2 (two) times daily.    . Protein (PROCEL) POWD Take 2 scoop by mouth 2 (two) times daily.    . traMADol (ULTRAM) 50 MG tablet Take 1  tablet (50 mg total) by mouth every 6 (six) hours as needed for moderate pain. 30 tablet 0   No current facility-administered medications on file prior to visit.     ALLERGIES: Allergies  Allergen Reactions  . Celecoxib     REACTION: unspecified  . Clindamycin Hcl Diarrhea  . Rofecoxib     REACTION: unspecified  . Sertraline Hcl     REACTION: unspecified    FAMILY HISTORY: Family History  Problem Relation Age of Onset  . Stroke Father [id number]  . Heart attack Father   . Coronary artery disease Father 2840  . Alcohol abuse Mother   . Breast cancer Sister   . Heart disease Sister   . Colon cancer Neg Hx     SOCIAL HISTORY: Social History   Social History  . Marital status: Married    Spouse name: N/A  . Number of children: N/A  . Years of education: N/A   Occupational History  . Not on file.   Social History Main Topics  . Smoking status: Former Smoker    Packs/day: 1.00    Years: 20.00    Types: Cigarettes    Quit date: 01/25/1988  . Smokeless tobacco: Never Used  . Alcohol use 0.0 oz/week     Comment: 2 glasses every day  . Drug use: No  . Sexual activity: Not on file   Other Topics Concern  . Not on file   Social History Narrative  . No narrative on file    REVIEW OF SYSTEMS: Constitutional: No fevers, chills, or sweats, no generalized fatigue, change in appetite Eyes: No visual changes, double vision, eye pain Ear, nose and throat:  No hearing loss, ear pain, nasal congestion, sore throat Cardiovascular: No chest pain, palpitations Respiratory:  No shortness of breath at rest or with exertion, wheezes GastrointestinaI: No nausea, vomiting, diarrhea, abdominal pain, fecal incontinence Genitourinary:  No dysuria, urinary retention or frequency Musculoskeletal:  No neck pain, back pain Integumentary: No rash, pruritus, skin lesions Neurological: as above Psychiatric: No depression, insomnia, anxiety Endocrine: No palpitations, fatigue, diaphoresis, mood swings, change in appetite, change in weight, increased thirst Hematologic/Lymphatic:  No anemia, purpura, petechiae. Allergic/Immunologic: no itchy/runny eyes, nasal congestion, recent allergic reactions, rashes  PHYSICAL EXAM: Vitals:   01/22/16 1026  BP: 100/62  Pulse: 70   General: No acute distress Head:  Normocephalic/atraumatic Eyes: Fundoscopic exam shows bilateral sharp discs, no vessel changes, exudates, or hemorrhages Neck: supple, no paraspinal tenderness, full range of motion Back: No paraspinal tenderness Heart: regular rate and rhythm Lungs: Clear to auscultation bilaterally. Vascular: No carotid bruits. Skin/Extremities: No rash, no edema Neurological Exam: Mental status: alert and oriented to person, place. Stated month was July. Fund of knowledge is appropriate.  Remote memory intact.  Attention and concentration are normal.    Able to name objects and repeat phrases. CDT4/5 MMSE - Mini Mental State Exam 01/22/2016  Orientation to time 0  Orientation to Place 5  Registration 3  Attention/ Calculation 5  Recall 0  Language- name 2 objects 2  Language- repeat 1  Language- follow 3 step command 1  Language- read & follow direction 1  Write a sentence 1  Copy design 1  Total score 20   Cranial nerves: CN I: not tested CN II: pupils equal, round and reactive to light, visual fields intact, fundi unremarkable. CN III, IV, VI:  full range of  motion, no nystagmus, no ptosis CN V: facial sensation intact CN VII: upper and  lower face symmetric CN VIII: hearing intact to finger rub CN IX, X: gag intact, uvula midline CN XI: sternocleidomastoid and trapezius muscles intact CN XII: tongue midline Bulk & Tone: normal, no fasciculations. Motor: 5/5 throughout with no pronator drift. Sensation: intact to light touch, cold, pin, vibration and joint position sense.  No extinction to double simultaneous stimulation.  Romberg test negative Deep Tendon Reflexes: +2 throughout, no ankle clonus Plantar responses: downgoing bilaterally Cerebellar: no incoordination on finger to nose, heel to shin. No dysdiadochokinesia Gait: narrow-based and steady, able to tandem walk adequately. Tremor: none  IMPRESSION: This is a pleasant 82 year old right-handed woman with a history of  hypertension, hyperlipidemia, PMR, alcohol abuse, anxiety, depression, presenting for worsening memory. She had been previously evaluated in 2013, at that time there was a death in the family, and Neuropsychological testing indicated MCI-Amnestic type or pseudodementia related to her severe anxiety and depression. Her MMSE today is 20/30, indicating mild dementia. It does seem that she has possibly some depressive symptoms as well (loss of interest, loss of appetite, wanting to be alone). She is scheduled for repeat Neuropsychological testing next month, which I agree with. She may benefit from starting Aricept, side effects and expectations from the medication were discussed. We discussed the importance of control of vascular risk factors, physical exercise, and brain stimulation exercises for brain health. She will follow-up in 6 months and knows to call for any changes.   Thank you for allowing me to participate in the care of this patient. Please do not hesitate to call for any questions or concerns.   Patrcia DollyKaren Aquino, M.D.  CC: Dr. Clent RidgesFry

## 2016-01-27 ENCOUNTER — Telehealth: Payer: Self-pay | Admitting: Family Medicine

## 2016-01-27 NOTE — Telephone Encounter (Signed)
She probably has some early dementia now, and this is a common symptom. They just need to work with her

## 2016-01-27 NOTE — Telephone Encounter (Signed)
[name] [name],PT state that the pt is having anxiety and scared to do a lot of moving about and doing PT.  Pt is scared to move from wheelchair to bed and back to the wheelchair.  [name] ReiningNicole the office coordinator state that they do not see any medication that would give her an anxiety and would like to know if you could give them some in site why she maybe having this symptom.  They would like to have a call.

## 2016-01-28 NOTE — Telephone Encounter (Signed)
Spoke to LeonardNicole and advised that Dr. Clent RidgesFry thinks the pt may have early dementia.  Advised to work with the pt as much as possible.  Joni Reiningicole has been in touch with the daughter.  Daughter thinks the therapy may be overwhelming and too much.  Daughter told Joni Reiningicole that the pt would like to scale back on therapy if ok with Dr. Clent RidgesFry.  Joni Reiningicole stated she will be faxing over a new order.  Will forward to Dr. Clent RidgesFry to inform him to watch for new order.

## 2016-01-29 NOTE — Telephone Encounter (Signed)
Noted  

## 2016-01-31 ENCOUNTER — Other Ambulatory Visit: Payer: Self-pay | Admitting: Adult Health

## 2016-01-31 ENCOUNTER — Encounter: Payer: Self-pay | Admitting: Neurology

## 2016-02-01 ENCOUNTER — Other Ambulatory Visit: Payer: Self-pay | Admitting: Family Medicine

## 2016-02-01 ENCOUNTER — Telehealth: Payer: Self-pay | Admitting: Family Medicine

## 2016-02-01 NOTE — Telephone Encounter (Signed)
Patient Name: [name] PancoastGLORIA [name]  DOB: 02/13/1930    Initial Comment Caller says, mother She had BM incontinent issue today. She also lost a 40 lbs during the last 8 months, she is not eating much at all.    Nurse Assessment  Nurse: Vickey SagesAtkins, RN, Jacquilin Date/Time (Eastern Time): 02/01/2016 10:58:04 AM  Confirm and document reason for call. If symptomatic, describe symptoms. ---Caller says, mother She had BM incontinent issue today. She also lost a 40 lbs during the last 8 months, she is not eating much at all. Was seen by Neuro for memory issues.  Does the patient have any new or worsening symptoms? ---Yes  Will a triage be completed? ---Yes  Related visit to physician within the last 2 weeks? ---No  Does the PT have any chronic conditions? (i.e. diabetes, asthma, etc.) ---Yes  Is this a behavioral health or substance abuse call? ---No     Guidelines    Guideline Title Affirmed Question Affirmed Notes  Diarrhea [1] MILD (e.g., 1-3 or more stools than normal in past 24 hours) diarrhea without known cause AND [2] present > 7 days    Final Disposition User   See PCP When Office is Open (within 3 days) Vickey SagesAtkins, RN, Jacquilin    Comments  Appointment made with PCP on Wed at 11   Referrals  REFERRED TO PCP OFFICE   Disagree/Comply: Comply

## 2016-02-01 NOTE — Telephone Encounter (Signed)
Noted  

## 2016-02-03 ENCOUNTER — Ambulatory Visit (INDEPENDENT_AMBULATORY_CARE_PROVIDER_SITE_OTHER): Payer: Medicare Other | Admitting: Family Medicine

## 2016-02-03 ENCOUNTER — Encounter: Payer: Self-pay | Admitting: Family Medicine

## 2016-02-03 VITALS — BP 100/61 | HR 73 | Temp 97.9°F

## 2016-02-03 DIAGNOSIS — F32A Depression, unspecified: Secondary | ICD-10-CM

## 2016-02-03 DIAGNOSIS — R32 Unspecified urinary incontinence: Secondary | ICD-10-CM

## 2016-02-03 DIAGNOSIS — F0390 Unspecified dementia without behavioral disturbance: Secondary | ICD-10-CM | POA: Diagnosis not present

## 2016-02-03 DIAGNOSIS — I5023 Acute on chronic systolic (congestive) heart failure: Secondary | ICD-10-CM

## 2016-02-03 DIAGNOSIS — I10 Essential (primary) hypertension: Secondary | ICD-10-CM

## 2016-02-03 DIAGNOSIS — F329 Major depressive disorder, single episode, unspecified: Secondary | ICD-10-CM | POA: Diagnosis not present

## 2016-02-03 DIAGNOSIS — I480 Paroxysmal atrial fibrillation: Secondary | ICD-10-CM

## 2016-02-03 MED ORDER — ESCITALOPRAM OXALATE 10 MG PO TABS: 10.0000 mg | ORAL_TABLET | Freq: Every day | ORAL | 11 refills | Status: AC

## 2016-02-03 MED ORDER — POTASSIUM CHLORIDE 20 MEQ PO PACK: 20.0000 meq | PACK | Freq: Two times a day (BID) | ORAL | 11 refills | Status: AC

## 2016-02-03 NOTE — Progress Notes (Signed)
   Subjective:    Patient ID: [name] [name], female    DOB: 04/05/1930, 83 y.o.   MRN: [id number]  HPI Here with husband and daughter to follow up. She seems to be enjoying staying at [facility] but her dementia continues to progress. She saw Dr. Karel JarvisAquino recently and she is taking Aricept 10 mg daily. Her appetite has greatly diminished and she has become incontinent of bowels and bladder. She wears adult diapers but her bed is still frequently soiled. The staff has noted a red spot on her buttock that could be an early pressure sore but it is not painful. The family says she has trouble getting her potassium tablets down and asks if there is an alternative. They are concerns that her Lexapro may be interfering with her appetite.    Review of Systems  Constitutional: Positive for appetite change.  Respiratory: Negative.   Cardiovascular: Negative.   Gastrointestinal: Negative for abdominal pain, constipation, diarrhea, nausea and vomiting.  Genitourinary: Negative for dysuria, frequency and urgency.  Neurological: Negative for dizziness, tremors, seizures, syncope, facial asymmetry, speech difficulty, light-headedness, numbness and headaches.       Objective:   Physical Exam  Constitutional: She appears well-developed and well-nourished.  In her wheelchair, alert   Neck: Neck supple. No thyromegaly present.  Cardiovascular: Normal rate, regular rhythm, normal heart sounds and intact distal pulses.   Pulmonary/Chest: Effort normal and breath sounds normal. No respiratory distress. She has no wheezes. She has no rales. She exhibits no tenderness.  Abdominal: Soft. Bowel sounds are normal. She exhibits no distension and no mass. There is no tenderness. There is no rebound and no guarding.  Musculoskeletal: She exhibits no edema.  Lymphadenopathy:    She has no cervical adenopathy.  Neurological: She is alert.  Psychiatric: She has a normal mood and affect. Her behavior is normal. Thought  content normal.          Assessment & Plan:  Her dementia is steadily progressing and the family understands that this is the natural course of this process. Her appetite decrease is most likely the result of the dementia, but we wil decrease her Lexapro dose to 10 mg daily. Switch the potassium to powder packets for convenience. I ordered her an egg crate mattress to help avoid pressure sores.  Gershon CraneStephen Fry, MD

## 2016-02-03 NOTE — Progress Notes (Signed)
Pre visit review using our clinic review tool, if applicable. No additional management support is needed unless otherwise documented below in the visit note. Pt unable to stand and weigh.   

## 2016-02-04 ENCOUNTER — Ambulatory Visit: Payer: Medicare Other | Admitting: Cardiology

## 2016-02-04 DIAGNOSIS — F0390 Unspecified dementia without behavioral disturbance: Secondary | ICD-10-CM | POA: Insufficient documentation

## 2016-02-08 ENCOUNTER — Telehealth: Payer: Self-pay

## 2016-02-08 MED ORDER — MEGESTROL ACETATE 625 MG/5ML PO SUSP: 625.0000 mg | Freq: Every day | ORAL | 5 refills | Status: DC

## 2016-02-08 NOTE — Telephone Encounter (Signed)
I spoke with pt's husband Leonette MostCharles, he asked that [facility], which I did and he will also let his daughter know about the new script.

## 2016-02-08 NOTE — Telephone Encounter (Signed)
Dr. Clent RidgesFry - Please advise. Thanks!    Patient Name: [name] PancoastGLORIA [name] Gender: Female DOB: 09/28/1930 Age: [age] Y 9 M 10 D Return Phone Number: [phone] (Primary), [phone] (Secondary) Address: [place]: [place] [id number] Client [facility] Night - Client Client Site [facility] - Night Physician [name], [name] Contact Type Call Who Is Calling Patient / Member / Family / Caregiver Call Type Triage / Clinical Caller Name [name] Relationship To Patient Daughter Return Phone Number [phone] (Primary) Chief Complaint CONFUSION - new onset Reason for Call Symptomatic / Request for Health Information Initial Comment Caller states that her mother is not eating due to her dementia and is experiencing rapid weight loss Translation No Nurse Assessment Nurse: Lucianne LeiGreenawalt, RN, [name] Date/Time (Eastern Time): 02/06/2016 7:19:25 PM Confirm and document reason for call. If symptomatic, describe symptoms. ---Patient's daughter states the patient has Dementia and she has not been eating well for a long time, she has been losing weight. She was seen by the doctor 2 days ago and the doctor said this was being caused by her increasing Dementia. No new symptoms. Daughter wants to know how long she can go without eating much. Is she going to die?

## 2016-02-08 NOTE — Telephone Encounter (Signed)
Sometimes taking Megace syrup can stimulate the appetite. I printed out a rx for her to try this

## 2016-02-10 DIAGNOSIS — I482 Chronic atrial fibrillation: Secondary | ICD-10-CM | POA: Diagnosis not present

## 2016-02-10 DIAGNOSIS — R41841 Cognitive communication deficit: Secondary | ICD-10-CM

## 2016-02-10 DIAGNOSIS — I110 Hypertensive heart disease with heart failure: Secondary | ICD-10-CM | POA: Diagnosis not present

## 2016-02-10 DIAGNOSIS — I5023 Acute on chronic systolic (congestive) heart failure: Secondary | ICD-10-CM | POA: Diagnosis not present

## 2016-02-10 DIAGNOSIS — F0390 Unspecified dementia without behavioral disturbance: Secondary | ICD-10-CM | POA: Diagnosis not present

## 2016-02-15 ENCOUNTER — Telehealth: Payer: Self-pay

## 2016-02-15 NOTE — Telephone Encounter (Signed)
Received PA request from [facility] for Megestrol 625 mg/ 5 ml. PA submitted & is pending. ZOX:[id number]:[id number]

## 2016-02-16 NOTE — Telephone Encounter (Signed)
Received form from insurance company. Form filled out & faxed back.

## 2016-02-17 ENCOUNTER — Telehealth: Payer: Self-pay | Admitting: Family Medicine

## 2016-02-17 NOTE — Telephone Encounter (Signed)
Per Dr. Clent RidgesFry okay and I did speak with Tiffany and gave verbal orders.

## 2016-02-17 NOTE — Telephone Encounter (Signed)
° ° ° ° °  Tiffany with [facility] would like a call back concerning patient

## 2016-02-17 NOTE — Telephone Encounter (Signed)
I tried to reach [name] [name] and no answer. I left a voice message, will call back again later.

## 2016-02-17 NOTE — Telephone Encounter (Signed)
PA denied. Medication is a plan exclusion.

## 2016-02-17 NOTE — Telephone Encounter (Signed)
I spoke with Tiffany, she would like a verbal order for wound care, pt has wound on left hip area and she has been scratching it. She will fax over the actual order later for Dr. Clent RidgesFry to sign.

## 2016-02-21 ENCOUNTER — Other Ambulatory Visit: Payer: Self-pay | Admitting: Adult Health

## 2016-02-22 ENCOUNTER — Other Ambulatory Visit: Payer: Self-pay | Admitting: Family Medicine

## 2016-02-22 NOTE — Telephone Encounter (Signed)
Call in #[phone] with 5 rf 

## 2016-02-22 NOTE — Telephone Encounter (Signed)
Can we refill this? 

## 2016-02-24 ENCOUNTER — Ambulatory Visit: Payer: Medicare Other | Admitting: Pulmonary Disease

## 2016-02-26 ENCOUNTER — Ambulatory Visit: Payer: Medicare Other | Admitting: Pulmonary Disease

## 2016-03-02 ENCOUNTER — Telehealth: Payer: Self-pay | Admitting: Family Medicine

## 2016-03-02 NOTE — Telephone Encounter (Signed)
Tiffany from [facility] called, pt magnesium now 1.9. Per Dr. Clent RidgesFry pt should continue with magnesium 400 mg take 2 a day ( total 800 mg ). I called [facility] and gave this information to [place] Cou[place], [facility] and let the pt know.

## 2016-03-07 ENCOUNTER — Emergency Department (HOSPITAL_COMMUNITY): Payer: Medicare Other

## 2016-03-07 ENCOUNTER — Emergency Department (HOSPITAL_COMMUNITY)
Admission: EM | Admit: 2016-03-07 | Discharge: 2016-03-07 | Disposition: A | Discharge status: Home / Self Care | Payer: Medicare Other | Attending: Emergency Medicine | Admitting: Emergency Medicine

## 2016-03-07 ENCOUNTER — Encounter (HOSPITAL_COMMUNITY): Payer: Self-pay | Admitting: Emergency Medicine

## 2016-03-07 DIAGNOSIS — W1839XA Other fall on same level, initial encounter: Secondary | ICD-10-CM | POA: Diagnosis not present

## 2016-03-07 DIAGNOSIS — S0990XA Unspecified injury of head, initial encounter: Secondary | ICD-10-CM | POA: Diagnosis present

## 2016-03-07 DIAGNOSIS — Z79899 Other long term (current) drug therapy: Secondary | ICD-10-CM | POA: Diagnosis not present

## 2016-03-07 DIAGNOSIS — R0789 Other chest pain: Secondary | ICD-10-CM | POA: Diagnosis not present

## 2016-03-07 DIAGNOSIS — Y999 Unspecified external cause status: Secondary | ICD-10-CM | POA: Diagnosis not present

## 2016-03-07 DIAGNOSIS — S0081XA Abrasion of other part of head, initial encounter: Secondary | ICD-10-CM | POA: Insufficient documentation

## 2016-03-07 DIAGNOSIS — E039 Hypothyroidism, unspecified: Secondary | ICD-10-CM | POA: Diagnosis not present

## 2016-03-07 DIAGNOSIS — Y92008 Other place in unspecified non-institutional (private) residence as the place of occurrence of the external cause: Secondary | ICD-10-CM | POA: Insufficient documentation

## 2016-03-07 DIAGNOSIS — Z96659 Presence of unspecified artificial knee joint: Secondary | ICD-10-CM | POA: Insufficient documentation

## 2016-03-07 DIAGNOSIS — Z7901 Long term (current) use of anticoagulants: Secondary | ICD-10-CM | POA: Diagnosis not present

## 2016-03-07 DIAGNOSIS — I5023 Acute on chronic systolic (congestive) heart failure: Secondary | ICD-10-CM | POA: Diagnosis not present

## 2016-03-07 DIAGNOSIS — Y9301 Activity, walking, marching and hiking: Secondary | ICD-10-CM | POA: Insufficient documentation

## 2016-03-07 DIAGNOSIS — I110 Hypertensive heart disease with heart failure: Secondary | ICD-10-CM | POA: Diagnosis not present

## 2016-03-07 DIAGNOSIS — Z87891 Personal history of nicotine dependence: Secondary | ICD-10-CM | POA: Insufficient documentation

## 2016-03-07 DIAGNOSIS — W19XXXA Unspecified fall, initial encounter: Secondary | ICD-10-CM

## 2016-03-07 LAB — COMPREHENSIVE METABOLIC PANEL
ALT: 14 U/L (ref 14–54)
ANION GAP: 9 (ref 5–15)
AST: 26 U/L (ref 15–41)
Albumin: 2.3 g/dL — ABNORMAL LOW (ref 3.5–5.0)
Alkaline Phosphatase: 78 U/L (ref 38–126)
BILIRUBIN TOTAL: 1.2 mg/dL (ref 0.3–1.2)
BUN: 10 mg/dL (ref 6–20)
CO2: 32 mmol/L (ref 22–32)
CREATININE: 0.58 mg/dL (ref 0.44–1.00)
Calcium: 8.8 mg/dL — ABNORMAL LOW (ref 8.9–10.3)
Chloride: 97 mmol/L — ABNORMAL LOW (ref 101–111)
Glucose, Bld: 82 mg/dL (ref 65–99)
Potassium: 4.1 mmol/L (ref 3.5–5.1)
SODIUM: 138 mmol/L (ref 135–145)
TOTAL PROTEIN: 7.5 g/dL (ref 6.5–8.1)

## 2016-03-07 LAB — URINALYSIS, ROUTINE W REFLEX MICROSCOPIC
BILIRUBIN URINE: NEGATIVE
Glucose, UA: NEGATIVE mg/dL
Hgb urine dipstick: NEGATIVE
KETONES UR: NEGATIVE mg/dL
LEUKOCYTES UA: NEGATIVE
NITRITE: NEGATIVE
PROTEIN: NEGATIVE mg/dL
Specific Gravity, Urine: 1.009 (ref 1.005–1.030)
pH: 8 (ref 5.0–8.0)

## 2016-03-07 LAB — CBC WITH DIFFERENTIAL/PLATELET
Basophils Absolute: 0.1 10*3/uL (ref 0.0–0.1)
Basophils Relative: 1 %
EOS ABS: 0.2 10*3/uL (ref 0.0–0.7)
Eosinophils Relative: 3 %
HCT: 34 % — ABNORMAL LOW (ref 36.0–46.0)
HEMOGLOBIN: 11 g/dL — AB (ref 12.0–15.0)
LYMPHS ABS: 2.7 10*3/uL (ref 0.7–4.0)
LYMPHS PCT: 38 %
MCH: 34.8 pg — AB (ref 26.0–34.0)
MCHC: 32.4 g/dL (ref 30.0–36.0)
MCV: 107.6 fL — AB (ref 78.0–100.0)
MONOS PCT: 11 %
Monocytes Absolute: 0.8 10*3/uL (ref 0.1–1.0)
NEUTROS PCT: 47 %
Neutro Abs: 3.3 10*3/uL (ref 1.7–7.7)
Platelets: 214 10*3/uL (ref 150–400)
RBC: 3.16 MIL/uL — ABNORMAL LOW (ref 3.87–5.11)
RDW: 17.9 % — ABNORMAL HIGH (ref 11.5–15.5)
WBC: 7 10*3/uL (ref 4.0–10.5)

## 2016-03-07 MED ORDER — DOXYCYCLINE HYCLATE 100 MG PO CAPS: 100.0000 mg | ORAL_CAPSULE | Freq: Two times a day (BID) | ORAL | 0 refills | Status: DC

## 2016-03-07 NOTE — ED Notes (Signed)
On way to CT 

## 2016-03-07 NOTE — ED Notes (Signed)
[facility] called  

## 2016-03-07 NOTE — ED Notes (Signed)
Report given to [place]ittany at [facility]

## 2016-03-07 NOTE — ED Provider Notes (Signed)
Patient sent from Dr. Charm BargesButler at 4 PM.  In brief, patient fell today, likely due to standing up without assistance. CT head and C-spine is unremarkable. Labs reassuring. Urine obtained to assess for weakness although patient has no symptoms of dysuria or pelvic pain. Urine pending at time of signout.  UA negative. Pt safe for dc. Updated, feels comfortable with plan. Since on digoxin, no z-pack, placed on doxy for possible abnormality on CXR.        [name] AceAlison Charruf Ronelle Smallman, MD 03/07/16 Windell [id number]    Gerhard Munchobert Lockwood, MD 03/08/16 Marlyne Beards0002

## 2016-03-07 NOTE — ED Provider Notes (Signed)
[facility]-EMERGENCY DEPT Provider Note   CSN: [id number] Arrival date & time: 03/07/16  1310     History   Chief Complaint Chief Complaint  Patient presents with  . Fall    HPI [name] [name] is a 83 y.o. female.  The history is provided by the patient.  Fall  This is a new problem. The current episode started 1 to 2 hours ago. The problem occurs rarely. The problem has not changed since onset.Pertinent negatives include no chest pain, no abdominal pain, no headaches and no shortness of breath. Nothing aggravates the symptoms. Nothing relieves the symptoms. She has tried nothing for the symptoms. The treatment provided no relief.    Past Medical History:  Diagnosis Date  . Anemia   . Anxiety   . Arthritis   . Dementia   . Depression   . Diverticulosis of colon   . History of alcohol abuse   . Hyperlipidemia   . Hypertension   . IPF (idiopathic pulmonary fibrosis) (HCC)   . Irritable bowel syndrome   . OSA (obstructive sleep apnea)   . Osteoporosis   . PMR (polymyalgia rheumatica) (HCC)   . Postherpetic neuralgia   . Spinal stenosis   . Thyroid disease   . Vertebral fracture   . Vitamin D deficiency     Patient Active Problem List   Diagnosis Date Noted  . Dementia 02/04/2016  . Pulmonary edema 11/20/2015  . Acute on chronic systolic CHF (congestive heart failure) (HCC) 11/20/2015  . Hyperbilirubinemia 11/20/2015  . Easy bruising 11/10/2015  . Bilateral leg edema 10/23/2015  . Urinary incontinence 10/23/2015  . PAF (paroxysmal atrial fibrillation) (HCC) 05/27/2015  . Fatigue 06/03/2014  . Hypothyroidism 04/02/2014  . Absolute anemia 01/01/2014  . IPF (idiopathic pulmonary fibrosis) (HCC) 01/01/2014  . OA (osteoarthritis) of knee 07/02/2010  . Memory loss 07/02/2010  . Hyperlipidemia 03/08/2010  . Vitamin D deficiency 10/05/2009  . Obstructive sleep apnea 10/08/2007  . SYNDROME, CHRONIC PAIN 11/16/2006  . DIVERTICULOSIS, COLON 06/06/2006  . Depression  04/30/2006  . Essential hypertension 04/30/2006  . OSTEOPOROSIS 04/30/2006    Past Surgical History:  Procedure Laterality Date  . APPENDECTOMY    . BREAST BIOPSY    . CATARACT EXTRACTION    . DILATION AND CURETTAGE OF UTERUS     x several  . KNEE ARTHROSCOPY  10/2007 and 10/2010   left  . REPLACEMENT TOTAL KNEE  august 2012  . ROTATOR CUFF REPAIR    . VERTEBROPLASTY     x 3    OB History    No data available       Home Medications    Prior to Admission medications   Medication Sig Start Date End Date Taking? Authorizing Provider  apixaban (ELIQUIS) 2.5 MG TABS tablet Take 1 tablet (2.5 mg total) by mouth 2 (two) times daily. 11/24/15  Yes Clanford Cyndie Mull, MD  carvedilol (COREG) 3.125 MG tablet TAKE 1 TABLET TWICE DAILY. 02/02/16  Yes Nelwyn Salisbury, MD  clobetasol ointment (TEMOVATE) 0.05 % Apply 1 application topically 2 (two) times daily. Patient taking differently: Apply 1 application topically 2 (two) times daily as needed (rash/dermatitis).  11/10/15  Yes Nelwyn Salisbury, MD  DIGOX 125 MCG tablet TAKE 1 TABLET EACH DAY. 02/02/16  Yes Nelwyn Salisbury, MD  escitalopram (LEXAPRO) 10 MG tablet Take 1 tablet (10 mg total) by mouth daily. 02/03/16  Yes Nelwyn Salisbury, MD  ferrous sulfate 325 (65 FE) MG tablet Take 325  mg by mouth See admin instructions. Take 1 tablet (325 mg) by mouth twice daily - with lunch and supper   Yes Historical Provider, MD  furosemide (LASIX) 40 MG tablet Take 1 tablet (40 mg total) by mouth daily. 11/24/15  Yes Clanford Cyndie Mull, MD  gabapentin (NEURONTIN) 100 MG capsule TAKE 1 CAPSULE EVERY DAY. 02/22/16  Yes Nelwyn Salisbury, MD  levothyroxine (SYNTHROID, LEVOTHROID) 75 MCG tablet Take 75 mcg by mouth daily before breakfast.   Yes Historical Provider, MD  Magnesium 400 MG CAPS Take 800 mg by mouth daily.   Yes Historical Provider, MD  pantoprazole (PROTONIX) 40 MG tablet Take 1 tablet (40 mg total) by mouth daily. 05/15/15  Yes Azalee Course, PA  potassium  chloride (K-DUR) 10 MEQ tablet Take 20 mEq by mouth 2 (two) times daily.   Yes Historical Provider, MD  Vitamin D, Ergocalciferol, (DRISDOL) 50000 units CAPS capsule Take 50,000 Units by mouth every Monday. 01/21/16  Yes Historical Provider, MD  donepezil (ARICEPT) 10 MG tablet Take 1/2 tablet daily for 1 month, then increase to 1 tablet daily Patient not taking: Reported on 03/07/2016 01/22/16   Van Clines, MD  megestrol (MEGACE ES) 625 MG/5ML suspension Take 5 mLs (625 mg total) by mouth daily. Patient not taking: Reported on 03/07/2016 02/08/16   Nelwyn Salisbury, MD  potassium chloride (KLOR-CON) 20 MEQ packet Take 20 mEq by mouth 2 (two) times daily. Patient not taking: Reported on 03/07/2016 02/03/16   Nelwyn Salisbury, MD  traMADol (ULTRAM) 50 MG tablet Take 1 tablet (50 mg total) by mouth every 6 (six) hours as needed for moderate pain. Patient not taking: Reported on 03/07/2016 11/24/15   Clanford Cyndie Mull, MD    Family History Family History  Problem Relation Age of Onset  . Stroke Father 100  . Heart attack Father   . Coronary artery disease Father 43  . Alcohol abuse Mother   . Breast cancer Sister   . Heart disease Sister   . Colon cancer Neg Hx     Social History Social History  Substance Use Topics  . Smoking status: Former Smoker    Packs/day: 1.00    Years: 20.00    Types: Cigarettes    Quit date: 01/25/1988  . Smokeless tobacco: Never Used  . Alcohol use 0.0 oz/week     Comment: 2 glasses every day     Allergies   Celecoxib; Clindamycin hcl; Rofecoxib; and Sertraline hcl   Review of Systems Review of Systems  Constitutional: Negative for chills and fever.  HENT: Negative for ear pain and sore throat.   Eyes: Negative for pain and visual disturbance.  Respiratory: Negative for cough and shortness of breath.   Cardiovascular: Negative for chest pain and palpitations.  Gastrointestinal: Negative for abdominal pain and vomiting.  Genitourinary: Negative for  dysuria and hematuria.  Musculoskeletal: Negative for arthralgias and back pain.  Skin: Negative for color change and rash.  Neurological: Negative for seizures, syncope and headaches.  All other systems reviewed and are negative.    Physical Exam Updated Vital Signs BP 103/67 (BP Location: Right Arm)   Pulse 94   Temp 97.9 F (36.6 C) (Oral)   Resp 22   Ht 5\' 6"  (1.676 m)   Wt 56.7 kg   SpO2 97%   BMI 20.18 kg/m   Physical Exam  Constitutional: She appears well-developed and well-nourished. No distress.  HENT:  Head: Normocephalic.  Small abrasion over left temple  Eyes: Conjunctivae are normal.  Neck:  C-collar in place. No midline cervical spine tenderness  Cardiovascular: Normal rate.   No murmur heard. Irregular rhythm  Pulmonary/Chest: Effort normal and breath sounds normal. No respiratory distress. She has no wheezes. She has no rales. She exhibits tenderness (mild tenderness to L anterior chest wall).  Abdominal: Soft. She exhibits no distension and no mass. There is no tenderness. There is no guarding.  Musculoskeletal: She exhibits no edema.  Neurological: She is alert. No cranial nerve deficit.  Oriented to person, place, and event. Pt has 5/5 strength in all extremities. Sensation to light touch intact throughout. Pt has normal coordination. No CN deficit.  Skin: Skin is warm and dry.  Psychiatric: She has a normal mood and affect.  Nursing note and vitals reviewed.    ED Treatments / Results  Labs (all labs ordered are listed, but only abnormal results are displayed) Labs Reviewed  CBC WITH DIFFERENTIAL/PLATELET - Abnormal; Notable for the following:       Result Value   RBC 3.16 (*)    Hemoglobin 11.0 (*)    HCT 34.0 (*)    MCV 107.6 (*)    MCH 34.8 (*)    RDW 17.9 (*)    All other components within normal limits  COMPREHENSIVE METABOLIC PANEL - Abnormal; Notable for the following:    Chloride 97 (*)    Calcium 8.8 (*)    Albumin 2.3 (*)     All other components within normal limits  URINE CULTURE  URINALYSIS, ROUTINE W REFLEX MICROSCOPIC    EKG  EKG Interpretation None       Radiology Dg Chest 2 View  Result Date: 03/07/2016 CLINICAL DATA:  Status post fall with persistent left chest wall pain. The patient also reports dizzy spells. History of hypertension, idiopathic pulmonary fibrosis, dementia, former smoker. EXAM: CHEST  2 VIEW COMPARISON:  PA and lateral chest x-ray of November 20, 2015 FINDINGS: The lungs are adequately inflated. The interstitial markings are coarse. The cardiac silhouette is enlarged. The pulmonary vascularity is mildly engorged and indistinct. There is no pleural effusion. There is calcification in the wall of the thoracic aorta. The bones are diffusely osteopenic. There are chronic compression fractures at the thoracolumbar junction. IMPRESSION: Chronic pulmonary fibrosis with superimposed interstitial edema or interstitial pneumonia. Cardiomegaly with pulmonary vascular congestion. The appearance of the chest has deteriorated since the previous study. Thoracic aortic atherosclerosis. Electronically Signed   By: David  [place] M.D.   On: 03/07/2016 14:29   Ct Head Wo Contrast  Result Date: 03/07/2016 CLINICAL DATA:  Fall today, chest wall pain EXAM: CT HEAD WITHOUT CONTRAST CT CERVICAL SPINE WITHOUT CONTRAST TECHNIQUE: Multidetector CT imaging of the head and cervical spine was performed following the standard protocol without intravenous contrast. Multiplanar CT image reconstructions of the cervical spine were also generated. COMPARISON:  11/20/2015 FINDINGS: CT HEAD FINDINGS Brain: No intracranial hemorrhage, mass effect or midline shift. Stable cerebral atrophy and chronic periventricular white matter disease. Ventricular size is stable from prior exam. No acute cortical infarction. No mass lesion is noted on this unenhanced scan. Vascular: Mild atherosclerotic calcifications of carotid siphon are noted.  Skull: No skull fracture is noted. Sinuses/Orbits: No acute findings mastoid air cells are unremarkable. Other: None CT CERVICAL SPINE FINDINGS Alignment: There is normal alignment.  Diffuse osteopenia is noted. Skull base and vertebrae: No acute fracture or subluxation. Degenerative changes are noted C1-C2 articulation. Mild posterior spurring noted at C4-C5-C5-C6 and C6-C7 level. Soft  tissues and spinal canal: Mild spinal canal stenosis due to posterior spurring at C4-C5 and C5-C6 level. No prevertebral soft tissue swelling. Cervical airway is patent. Disc levels: Mild disc space flattening at C3-C4, C4-C5, C5-C6 and C6-C7 level. Upper chest: The visualized lung apices shows no evidence of pneumothorax. Atherosclerotic calcifications of thoracic aorta. Fibrotic changes are noted bilateral lung apices. Other: Atherosclerotic calcifications are noted bilateral carotid bifurcation. IMPRESSION: 1. No acute intracranial abnormality. Stable atrophy and chronic white matter disease. 2. No cervical spine acute fracture or subluxation. Diffuse osteopenia is noted. Multilevel degenerative changes as described above. Electronically Signed   By: Natasha Mead M.D.   On: 03/07/2016 14:30   Ct Cervical Spine Wo Contrast  Result Date: 03/07/2016 CLINICAL DATA:  Fall today, chest wall pain EXAM: CT HEAD WITHOUT CONTRAST CT CERVICAL SPINE WITHOUT CONTRAST TECHNIQUE: Multidetector CT imaging of the head and cervical spine was performed following the standard protocol without intravenous contrast. Multiplanar CT image reconstructions of the cervical spine were also generated. COMPARISON:  11/20/2015 FINDINGS: CT HEAD FINDINGS Brain: No intracranial hemorrhage, mass effect or midline shift. Stable cerebral atrophy and chronic periventricular white matter disease. Ventricular size is stable from prior exam. No acute cortical infarction. No mass lesion is noted on this unenhanced scan. Vascular: Mild atherosclerotic calcifications of  carotid siphon are noted. Skull: No skull fracture is noted. Sinuses/Orbits: No acute findings mastoid air cells are unremarkable. Other: None CT CERVICAL SPINE FINDINGS Alignment: There is normal alignment.  Diffuse osteopenia is noted. Skull base and vertebrae: No acute fracture or subluxation. Degenerative changes are noted C1-C2 articulation. Mild posterior spurring noted at C4-C5-C5-C6 and C6-C7 level. Soft tissues and spinal canal: Mild spinal canal stenosis due to posterior spurring at C4-C5 and C5-C6 level. No prevertebral soft tissue swelling. Cervical airway is patent. Disc levels: Mild disc space flattening at C3-C4, C4-C5, C5-C6 and C6-C7 level. Upper chest: The visualized lung apices shows no evidence of pneumothorax. Atherosclerotic calcifications of thoracic aorta. Fibrotic changes are noted bilateral lung apices. Other: Atherosclerotic calcifications are noted bilateral carotid bifurcation. IMPRESSION: 1. No acute intracranial abnormality. Stable atrophy and chronic white matter disease. 2. No cervical spine acute fracture or subluxation. Diffuse osteopenia is noted. Multilevel degenerative changes as described above. Electronically Signed   By: Natasha Mead M.D.   On: 03/07/2016 14:30    Procedures Procedures (including critical care time)  Medications Ordered in ED Medications - No data to display   Initial Impression / Assessment and Plan / ED Course  I have reviewed the triage vital signs and the nursing notes.  Pertinent labs & imaging results that were available during my care of the patient were reviewed by me and considered in my medical decision making (see chart for details).     She does a very pleasant 83 year old female with history as above who presents after a fall at home. Patient lives at home with her husband and has home health in the home at all times. She typically walks with assistance. Evidently, the patient got up to walk down the hall by herself today and  suffered a ground level fall. She was found alert on the ground. She has no complaints of pain. She does not recall the events surrounding her fall, however her daughter states this would be typical for her given the dementia. On exam here, the patient has a small abrasion to her left temple as well as mild tenderness over her left anterior chest wall. Neuro exam is intact.  CT head and CT cervical spine unremarkable. Chest x-ray shows possible pneumonia. Patient and her daughter deny the patient has had any recent respiratory symptoms or fever. CBC notable for hemoglobin of 11, down about 3 points from last hemoglobin and October. Patient denies any recent bleeding or changes to the color of her stool. Regarding the hemoglobin, plan is for her to follow-up with her PCP within one week to have it rechecked. Urinalysis is pending. Due to the possible pneumonia, if UA is negative, will provide deferred prescription for azithromycin. [facility]  Final Clinical Impressions(s) / ED Diagnoses   Final diagnoses:  Fall, initial encounter    New Prescriptions New Prescriptions   No medications on file     Lennette BihariJohn Michael Derin Matthes, MD 03/07/16 1629    Courteney Lyn Corlis LeakMackuen, MD 03/12/16 [id number]

## 2016-03-07 NOTE — ED Triage Notes (Signed)
Per EMS, patient had an unwitnessed fall at home.  Was found on right side.  Denies any pain.  No deformities noted.  Hx of dementia.  Remembers falling.  On eloquis.  Lucidity here and there.  EKG shows Afib.  Hx of Afib. Abrasion to left forehead with no bleeding or hematoma.  105/86, 98% RA, CBG 155, Some rhonchi and rales heard.  Getting over pneumonia.  20G LAC.

## 2016-03-08 ENCOUNTER — Telehealth: Payer: Self-pay | Admitting: Family Medicine

## 2016-03-08 LAB — URINE CULTURE
CULTURE: NO GROWTH
SPECIAL REQUESTS: NORMAL

## 2016-03-08 NOTE — Telephone Encounter (Signed)
Lorain ChildesFYI [facility] is calling pt had   fall yesterday and pt went to er and dx with uti. Pt does not have any injuries Pt wound on left thigh looks good

## 2016-03-20 ENCOUNTER — Other Ambulatory Visit: Payer: Self-pay | Admitting: Adult Health

## 2016-03-21 ENCOUNTER — Ambulatory Visit: Payer: Medicare Other | Admitting: Cardiology

## 2016-03-21 ENCOUNTER — Other Ambulatory Visit: Payer: Self-pay | Admitting: Family Medicine

## 2016-03-27 ENCOUNTER — Other Ambulatory Visit: Payer: Self-pay | Admitting: Family Medicine

## 2016-03-28 ENCOUNTER — Telehealth: Payer: Self-pay | Admitting: Family Medicine

## 2016-03-28 NOTE — Telephone Encounter (Signed)
° ° °  Daughter call to say she dropped off paperwork today and said she would like to pick up the paperwork Tues or Wednesday

## 2016-03-28 NOTE — Telephone Encounter (Signed)
We will work on this.

## 2016-04-06 ENCOUNTER — Ambulatory Visit: Payer: Medicare Other | Admitting: Pulmonary Disease

## 2016-04-06 ENCOUNTER — Telehealth: Payer: Self-pay | Admitting: Family Medicine

## 2016-04-06 NOTE — Telephone Encounter (Signed)
Pt has gone to the facility with a bed with long rails and they are not to have that at their facility the facility would like to have a order for a 1/2 rail bed and they have sent orders over to be signed and they are needing that as soon as possible please.

## 2016-04-06 NOTE — Telephone Encounter (Signed)
We will take care of this when the forms arrive

## 2016-04-20 ENCOUNTER — Ambulatory Visit: Payer: Medicare Other | Admitting: Cardiology

## 2016-04-26 ENCOUNTER — Telehealth: Payer: Self-pay | Admitting: Family Medicine

## 2016-04-26 NOTE — Telephone Encounter (Signed)
Bukky with [facility] calling back because they need an order from Dr Clent Ridges stating OK for pt to have the 1/2 rail bed.  They cannot allow pt to continue to have the bed rails without an order because it is considered a restraint. Would like dr fry to write an order for the rails and fax to  [phone]

## 2016-04-26 NOTE — Telephone Encounter (Signed)
Order was faxed to below number.

## 2016-04-26 NOTE — Telephone Encounter (Signed)
duplicate

## 2016-05-02 ENCOUNTER — Telehealth: Payer: Self-pay | Admitting: Family Medicine

## 2016-05-02 DIAGNOSIS — F0390 Unspecified dementia without behavioral disturbance: Secondary | ICD-10-CM | POA: Diagnosis not present

## 2016-05-02 NOTE — Telephone Encounter (Signed)
[facility] is calling needing the signed order back so the pt can get the bed rails.

## 2016-05-03 NOTE — Telephone Encounter (Signed)
Order was faxed today.

## 2016-05-09 ENCOUNTER — Encounter: Payer: Self-pay | Admitting: Cardiology

## 2016-05-09 ENCOUNTER — Ambulatory Visit (INDEPENDENT_AMBULATORY_CARE_PROVIDER_SITE_OTHER): Payer: Medicare Other | Admitting: Cardiology

## 2016-05-09 VITALS — BP 120/78 | HR 73 | Ht 66.0 in | Wt 108.0 lb

## 2016-05-09 DIAGNOSIS — I420 Dilated cardiomyopathy: Secondary | ICD-10-CM | POA: Insufficient documentation

## 2016-05-09 DIAGNOSIS — I4821 Permanent atrial fibrillation: Secondary | ICD-10-CM | POA: Insufficient documentation

## 2016-05-09 DIAGNOSIS — J84112 Idiopathic pulmonary fibrosis: Secondary | ICD-10-CM

## 2016-05-09 DIAGNOSIS — I482 Chronic atrial fibrillation: Secondary | ICD-10-CM | POA: Diagnosis not present

## 2016-05-09 DIAGNOSIS — I5022 Chronic systolic (congestive) heart failure: Secondary | ICD-10-CM | POA: Diagnosis not present

## 2016-05-09 NOTE — Progress Notes (Signed)
Cardiology Office Note    Date:  05/09/2016   ID:  [name] [name], DOB 12/05/1930, MRN [id number]  PCP:  Gershon Crane, MD  Cardiologist:   Donato Schultz, MD     History of Present Illness:  [name] [name] is a 83 y.o. female residence of [place] here for follow-up of chronic systolic heart failure with ejection fraction of 25-30%, atrial fibrillation on chronic anticoagulation with interstitial lung disease.  She used to smoke for several years "I love my [place] slims ". No prior strokes. She has been taking Eliquis for her atrial fibrillation and carvedilol for her cardiomyopathy. No orthopnea, no PND. Her husband's name is Leonette Most. She has been experiencing some weight loss.  She was in the hospital on 11/24/15 with rate controlled atrial fibrillation, acute on chronic combined systolic and diastolic heart failure. He was prompted by her not taking the Lasix over several weeks because of incontinence. Carvedilol low-dose 3.125. Blood pressure too low to add an ACE inhibitor or ARB. Lasix was changed to 40 mg daily.  Dr. Kendrick Fries has been following her pulmonary fibrosis. She also had another fall which prompted an ER visit.  She has been losing weight. At one point she was not eating that much. She does have ensure available to her daughter states. No syncope, no shortness of breath, no orthopnea, no PND.    Past Medical History:  Diagnosis Date  . Anemia   . Anxiety   . Arthritis   . Dementia   . Depression   . Diverticulosis of colon   . History of alcohol abuse   . Hyperlipidemia   . Hypertension   . IPF (idiopathic pulmonary fibrosis) (HCC)   . Irritable bowel syndrome   . OSA (obstructive sleep apnea)   . Osteoporosis   . PMR (polymyalgia rheumatica) (HCC)   . Postherpetic neuralgia   . Spinal stenosis   . Thyroid disease   . Vertebral fracture   . Vitamin D deficiency     Past Surgical History:  Procedure Laterality Date  . APPENDECTOMY    . BREAST BIOPSY     . CATARACT EXTRACTION    . DILATION AND CURETTAGE OF UTERUS     x several  . KNEE ARTHROSCOPY  10/2007 and 10/2010   left  . REPLACEMENT TOTAL KNEE  august 2012  . ROTATOR CUFF REPAIR    . VERTEBROPLASTY     x 3    Current Medications: Outpatient Medications Prior to Visit  Medication Sig Dispense Refill  . carvedilol (COREG) 3.125 MG tablet TAKE 1 TABLET TWICE DAILY. 60 tablet 5  . ELIQUIS 2.5 MG TABS tablet TAKE 1 TABLET TWICE DAILY. 60 tablet 11  . escitalopram (LEXAPRO) 10 MG tablet Take 1 tablet (10 mg total) by mouth daily. 30 tablet 11  . ferrous sulfate 325 (65 FE) MG tablet TAKE 1 TABLET TWICE DAILY. 60 tablet 1  . furosemide (LASIX) 40 MG tablet Take 1 tablet (40 mg total) by mouth daily. 30 tablet   . gabapentin (NEURONTIN) 100 MG capsule TAKE 1 CAPSULE EVERY DAY. 30 capsule 5  . levothyroxine (SYNTHROID, LEVOTHROID) 75 MCG tablet Take 75 mcg by mouth daily before breakfast.    . Magnesium 400 MG CAPS Take 800 mg by mouth daily.    . pantoprazole (PROTONIX) 40 MG tablet Take 1 tablet (40 mg total) by mouth daily. 90 tablet 3  . potassium chloride (KLOR-CON) 20 MEQ packet Take 20 mEq by mouth 2 (two) times  daily. 60 packet 11  . DIGOX 125 MCG tablet TAKE 1 TABLET EACH DAY. 30 tablet 5  . clobetasol ointment (TEMOVATE) 0.05 % Apply 1 application topically 2 (two) times daily. (Patient taking differently: Apply 1 application topically 2 (two) times daily as needed (rash/dermatitis). ) 60 g 5  . donepezil (ARICEPT) 10 MG tablet Take 1/2 tablet daily for 1 month, then increase to 1 tablet daily (Patient not taking: Reported on 03/07/2016) 30 tablet 11  . doxycycline (VIBRAMYCIN) 100 MG capsule Take 1 capsule (100 mg total) by mouth 2 (two) times daily. 20 capsule 0  . megestrol (MEGACE ES) 625 MG/5ML suspension Take 5 mLs (625 mg total) by mouth daily. (Patient not taking: Reported on 03/07/2016) 150 mL 5  . potassium chloride (K-DUR) 10 MEQ tablet Take 20 mEq by mouth 2 (two)  times daily.    . traMADol (ULTRAM) 50 MG tablet Take 1 tablet (50 mg total) by mouth every 6 (six) hours as needed for moderate pain. (Patient not taking: Reported on 03/07/2016) 30 tablet 0  . Vitamin D, Ergocalciferol, (DRISDOL) 50000 units CAPS capsule Take 50,000 Units by mouth every Monday.     No facility-administered medications prior to visit.      Allergies:   Celecoxib; Clindamycin hcl; Rofecoxib; and Sertraline hcl   Social History   Social History  . Marital status: Married    Spouse name: N/A  . Number of children: N/A  . Years of education: N/A   Social History Main Topics  . Smoking status: Former Smoker    Packs/day: 1.00    Years: 20.00    Types: Cigarettes    Quit date: 01/25/1988  . Smokeless tobacco: Never Used  . Alcohol use 0.0 oz/week     Comment: 2 glasses every day  . Drug use: No  . Sexual activity: Not Asked   Other Topics Concern  . None   Social History Narrative  . None     Family History:  The patient's family history includes Alcohol abuse in her mother; Breast cancer in her sister; Coronary artery disease (age of onset: 4) in her father; Heart attack in her father; Heart disease in her sister; Stroke (age of onset: 76) in her father.   ROS:   Please see the history of present illness.    ROS All other systems reviewed and are negative.   PHYSICAL EXAM:   VS:  BP 120/78   Pulse 73   Ht  (1.676 m)   Wt 108 lb (49 kg)   BMI 17.43 kg/m    GEN: Elderly thin in wheelchair, in no acute distress  HEENT: normal  Neck: no JVD, carotid bruits, or masses Cardiac: Irregularly irregular, normal rate; no murmurs, rubs, or gallops,no edema  Respiratory:  clear to auscultation bilaterally, normal work of breathing GI: soft, nontender, nondistended, + BS MS: no deformity or atrophy  Skin: warm and dry, no rash Neuro:  Alert and Oriented x 3, Strength and sensation are intact Psych: euthymic mood, full affect  Wt Readings from Last 3  Encounters:  05/09/16 108 lb (49 kg)  03/07/16 125 lb (56.7 kg)  01/11/16 116 lb (52.6 kg)      Studies/Labs Reviewed:   EKG:  Prior EKG demonstrates atrial fibrillation with good rate control personally viewed  Recent Labs: 11/20/2015: B Natriuretic Peptide 752.6 11/23/2015: Magnesium 1.5 11/26/2015: TSH 3.95 03/07/2016: ALT 14; BUN 10; Creatinine, Ser 0.58; Hemoglobin 11.0; Platelets 214; Potassium 4.1; Sodium 138  Lipid Panel    Component Value Date/Time   CHOL 206 (H) 03/08/2010 0958   TRIG 78.0 03/08/2010 0958   HDL 74.80 03/08/2010 0958   CHOLHDL 3 03/08/2010 0958   VLDL 15.6 03/08/2010 0958   LDLDIRECT 124.9 03/08/2010 0958    Additional studies/ records that were reviewed today include:  ECHO: 06/03/15: - Left ventricle: The cavity size was normal. Wall thickness was   increased in a pattern of moderate LVH. Systolic function was   severely reduced. The estimated ejection fraction was in the   range of 25% to 30%. Diffuse hypokinesis. The study is not   technically sufficient to allow evaluation of LV diastolic   function. - Aortic valve: Trileaflet; mildly calcified leaflets. There was no   stenosis. There was mild regurgitation. - Mitral valve: Calcified annulus. Mildly thickened leaflets .   There was mild regurgitation. - Left atrium: Severely dilated at 55 ml/m2. - Right ventricle: The cavity size was moderately dilated. Systolic   function is reduced. - Right atrium: Severely dilated at 28 cm2. - Tricuspid valve: There was moderate regurgitation. - Pulmonary arteries: PA peak pressure: 64 mm Hg (S). - Inferior vena cava: The vessel was normal in size. The   respirophasic diameter changes were in the normal range (>= 50%),   consistent with normal central venous pressure. - Pericardium, extracardiac: A trivial pericardial effusion was   identified posterior to the heart. Features were not consistent   with tamponade physiology.  Impressions:  -  Compared to a prior echo in 2015, the LVEF has reduced from   55-60% to 25-30% wtih global hypokinesis. Atrial fibrillation is   noted.    ASSESSMENT:    1. Dilated cardiomyopathy (HCC)   2. Chronic systolic heart failure (HCC)   3. Idiopathic pulmonary fibrosis (HCC)   4. Permanent atrial fibrillation (HCC)      PLAN:  In order of problems listed above:  Dilated cardiomyopathy/chronic systolic heart failure  - Last hospitalization in October 2017.  - On low-dose medications, cannot tolerate ACE inhibitor because of hypotension.  - Continue Lasix 40 mg once a day. Prior blood work reviewed, reassuring.  Permanent atrial fibrillation  - Rate controlled.  - I will stop her digoxin given her advanced age.  - Continue with low-dose carvedilol 3.125 mg twice a day  Idiopathic pulmonary fibrosis  - Dr. Kendrick Fries has been following. No changes.  Medication Adjustments/Labs and Tests Ordered: Current medicines are reviewed at length with the patient today.  Concerns regarding medicines are outlined above.  Medication changes, Labs and Tests ordered today are listed in the Patient Instructions below. Patient Instructions  Medication Instructions:  You may stop your Digoxin. Continue all other medications as listed.  Follow-Up: Follow up in 6 months with Dr. Anne Fu.  You will receive a letter in the mail 2 months before you are due.  Please call us when you receive this letter to schedule your follow up appointment.  If you need a refill on your cardiac medications before your next appointment, please call your pharmacy.  Thank you for choosing [facility]!!        Signed, Donato Schultz, MD  05/09/2016 11:35 AM    [facility] [address] [place], [place], [place]  [address] Phone: [phone]; Fax: [phone]

## 2016-05-09 NOTE — Patient Instructions (Signed)
Medication Instructions:  You may stop your Digoxin. Continue all other medications as listed.  Follow-Up: Follow up in 6 months with Dr. Anne Fu.  You will receive a letter in the mail 2 months before you are due.  Please call us when you receive this letter to schedule your follow up appointment.  If you need a refill on your cardiac medications before your next appointment, please call your pharmacy.  Thank you for choosing [facility]!!

## 2016-06-22 ENCOUNTER — Ambulatory Visit: Payer: Medicare Other | Admitting: Pulmonary Disease

## 2016-06-27 DIAGNOSIS — F0390 Unspecified dementia without behavioral disturbance: Secondary | ICD-10-CM

## 2016-06-28 ENCOUNTER — Telehealth: Payer: Self-pay | Admitting: Family Medicine

## 2016-06-28 NOTE — Telephone Encounter (Signed)
Form dropped off at the front desk on 06/27/16.  Called [name] (daughter) to gather more information to fill out form.  Will now forward to Dr. Clent RidgesFry to review and sign.

## 2016-06-30 NOTE — Telephone Encounter (Signed)
Form completed by Dr. Clent RidgesFry.  Waiting on Dr. Durene CalHunter to complete other form for husband.  Will then call for pick up.  Paper work currently on my desk.

## 2016-07-01 ENCOUNTER — Ambulatory Visit (INDEPENDENT_AMBULATORY_CARE_PROVIDER_SITE_OTHER): Payer: Medicare Other | Admitting: *Deleted

## 2016-07-01 DIAGNOSIS — Z23 Encounter for immunization: Secondary | ICD-10-CM | POA: Diagnosis not present

## 2016-07-01 NOTE — Telephone Encounter (Signed)
Broughton Burnet and notified her the paper work is available for pick up at the front desk. 

## 2016-07-04 ENCOUNTER — Ambulatory Visit: Payer: Medicare Other

## 2016-07-04 LAB — TB SKIN TEST
Induration: 0 mm
TB Skin Test: NEGATIVE

## 2016-07-15 ENCOUNTER — Ambulatory Visit: Payer: Medicare Other | Admitting: Pulmonary Disease

## 2016-07-25 ENCOUNTER — Ambulatory Visit: Payer: Medicare Other | Admitting: Neurology

## 2016-08-23 ENCOUNTER — Telehealth: Payer: Self-pay | Admitting: Family Medicine

## 2016-08-23 NOTE — Telephone Encounter (Signed)
Pt need new order the pt to have tylenol for lower back pain will be going to see orthopaedic doctor on Thursday at 11:15 Dr. Lanny HurstVortek.  Pts daughter would like to be contacted when this is going to be sent [facility].

## 2016-08-23 NOTE — Telephone Encounter (Signed)
[facility] and fax[facility] order to [phone]. Dr. Lanny HurstVortek did order Tramadol, however not sure if pt will be able to get this medication today. Dr. Clent RidgesFry ordered Tylenol and pt can  either one as needed.

## 2016-08-23 NOTE — Telephone Encounter (Signed)
The order is ready to fax  

## 2016-08-25 ENCOUNTER — Ambulatory Visit: Payer: Medicare Other | Admitting: Pulmonary Disease

## 2016-08-27 ENCOUNTER — Emergency Department (HOSPITAL_BASED_OUTPATIENT_CLINIC_OR_DEPARTMENT_OTHER): Payer: Medicare Other

## 2016-08-27 ENCOUNTER — Emergency Department (HOSPITAL_BASED_OUTPATIENT_CLINIC_OR_DEPARTMENT_OTHER)
Admission: EM | Admit: 2016-08-27 | Discharge: 2016-08-27 | Disposition: A | Discharge status: Skilled Nursing Facility | Payer: Medicare Other | Attending: Emergency Medicine | Admitting: Emergency Medicine

## 2016-08-27 ENCOUNTER — Encounter (HOSPITAL_BASED_OUTPATIENT_CLINIC_OR_DEPARTMENT_OTHER): Payer: Self-pay

## 2016-08-27 DIAGNOSIS — I110 Hypertensive heart disease with heart failure: Secondary | ICD-10-CM | POA: Diagnosis not present

## 2016-08-27 DIAGNOSIS — Z7901 Long term (current) use of anticoagulants: Secondary | ICD-10-CM | POA: Diagnosis not present

## 2016-08-27 DIAGNOSIS — I5022 Chronic systolic (congestive) heart failure: Secondary | ICD-10-CM | POA: Insufficient documentation

## 2016-08-27 DIAGNOSIS — Z79899 Other long term (current) drug therapy: Secondary | ICD-10-CM | POA: Insufficient documentation

## 2016-08-27 DIAGNOSIS — G8929 Other chronic pain: Secondary | ICD-10-CM

## 2016-08-27 DIAGNOSIS — Z87891 Personal history of nicotine dependence: Secondary | ICD-10-CM | POA: Diagnosis not present

## 2016-08-27 DIAGNOSIS — E039 Hypothyroidism, unspecified: Secondary | ICD-10-CM | POA: Diagnosis not present

## 2016-08-27 DIAGNOSIS — M546 Pain in thoracic spine: Secondary | ICD-10-CM | POA: Diagnosis present

## 2016-08-27 DIAGNOSIS — M549 Dorsalgia, unspecified: Secondary | ICD-10-CM

## 2016-08-27 HISTORY — DX: Heart failure, unspecified: I50.9

## 2016-08-27 HISTORY — DX: Unspecified atrial fibrillation: I48.91

## 2016-08-27 MED ORDER — LIDOCAINE 5 % EX PTCH: 1.0000 | MEDICATED_PATCH | CUTANEOUS | 0 refills | Status: DC

## 2016-08-27 MED ORDER — MORPHINE SULFATE (PF) 2 MG/ML IV SOLN
2.0000 mg | INTRAVENOUS | Status: DC | PRN
  Administered 2016-08-27: 2 mg via INTRAVENOUS
  Filled 2016-08-27: qty 1

## 2016-08-27 NOTE — ED Notes (Signed)
ED Provider at bedside. 

## 2016-08-27 NOTE — ED Notes (Signed)
Transport called @ 1213

## 2016-08-27 NOTE — ED Provider Notes (Signed)
[facility] Provider Note   CSN: [id number] Arrival date & time: 08/27/16  1025     History   Chief Complaint Chief Complaint  Patient presents with  . Back Pain    HPI [name] [name] is a 83 y.o. female.  HPI Patient presents to the emergency room for evaluation of back pain. Patient has a history of previous back problems. She has been treated with kyphoplasty in the past. She had been doing well until the past week. Patient started having pain in her mid back a little bit more on the right side. She called her orthopedic doctor who called in Ultram. It was not helping much so they called the doctor again. She was told to increase the Ultram but she has not started taking that increased frequency yet. Because she was continuing to hurt her daughter wanted her to come in to get x-rays. Patient denies any chest pain or shortness of breath. No abdominal pain. No numbness or weakness. The pain does increase with movements and certain positions. Past Medical History:  Diagnosis Date  . Anemia   . Anxiety   . Arthritis   . Atrial fibrillation (HCC)   . CHF (congestive heart failure) (HCC)   . Dementia   . Depression   . Diverticulosis of colon   . History of alcohol abuse   . Hyperlipidemia   . Hypertension   . IPF (idiopathic pulmonary fibrosis) (HCC)   . Irritable bowel syndrome   . OSA (obstructive sleep apnea)   . Osteoporosis   . PMR (polymyalgia rheumatica) (HCC)   . Postherpetic neuralgia   . Spinal stenosis   . Thyroid disease   . Vertebral fracture   . Vitamin D deficiency     Patient Active Problem List   Diagnosis Date Noted  . Permanent atrial fibrillation (HCC) 05/09/2016  . Dilated cardiomyopathy (HCC) 05/09/2016  . Dementia 02/04/2016  . Pulmonary edema 11/20/2015  . Chronic systolic heart failure (HCC) 11/20/2015  . Hyperbilirubinemia 11/20/2015  . Easy bruising 11/10/2015  . Bilateral leg edema 10/23/2015  . Urinary incontinence  10/23/2015  . PAF (paroxysmal atrial fibrillation) (HCC) 05/27/2015  . Fatigue 06/03/2014  . Hypothyroidism 04/02/2014  . Absolute anemia 01/01/2014  . Idiopathic pulmonary fibrosis (HCC) 01/01/2014  . OA (osteoarthritis) of knee 07/02/2010  . Memory loss 07/02/2010  . Hyperlipidemia 03/08/2010  . Vitamin D deficiency 10/05/2009  . Obstructive sleep apnea 10/08/2007  . SYNDROME, CHRONIC PAIN 11/16/2006  . DIVERTICULOSIS, COLON 06/06/2006  . Depression 04/30/2006  . Essential hypertension 04/30/2006  . OSTEOPOROSIS 04/30/2006    Past Surgical History:  Procedure Laterality Date  . APPENDECTOMY    . BREAST BIOPSY    . CATARACT EXTRACTION    . DILATION AND CURETTAGE OF UTERUS     x several  . KNEE ARTHROSCOPY  10/2007 and 10/2010   left  . REPLACEMENT TOTAL KNEE  august 2012  . ROTATOR CUFF REPAIR    . VERTEBROPLASTY     x 3    OB History    No data available       Home Medications    Prior to Admission medications   Medication Sig Start Date End Date Taking? Authorizing Provider  carvedilol (COREG) 3.125 MG tablet TAKE 1 TABLET TWICE DAILY. 02/02/16  Yes Nelwyn SalisburyFry, Stephen A, MD  ELIQUIS 2.5 MG TABS tablet TAKE 1 TABLET TWICE DAILY. 03/21/16  Yes Nelwyn SalisburyFry, Stephen A, MD  escitalopram (LEXAPRO) 10 MG tablet Take 1 tablet (10  mg total) by mouth daily. 02/03/16  Yes Nelwyn SalisburyFry, Stephen A, MD  ferrous sulfate 325 (65 FE) MG tablet TAKE 1 TABLET TWICE DAILY. 03/29/16  Yes Nelwyn SalisburyFry, Stephen A, MD  furosemide (LASIX) 40 MG tablet Take 1 tablet (40 mg total) by mouth daily. 11/24/15  Yes Johnson, Clanford L, MD  gabapentin (NEURONTIN) 100 MG capsule TAKE 1 CAPSULE EVERY DAY. 02/22/16  Yes Nelwyn SalisburyFry, Stephen A, MD  levothyroxine (SYNTHROID, LEVOTHROID) 75 MCG tablet Take 75 mcg by mouth daily before breakfast.   Yes [provider]  Magnesium 400 MG CAPS Take 800 mg by mouth daily.   Yes [provider]  pantoprazole (PROTONIX) 40 MG tablet Take 1 tablet (40 mg total) by mouth daily. 05/15/15   Yes Azalee CourseMeng, Hao, PA  potassium chloride (KLOR-CON) 20 MEQ packet Take 20 mEq by mouth 2 (two) times daily. 02/03/16  Yes Nelwyn SalisburyFry, Stephen A, MD  lidocaine (LIDODERM) 5 % Place 1 patch onto the skin daily. Remove & Discard patch within 12 hours or as directed by MD 08/27/16   Linwood DibblesKnapp, Danilo Cappiello, MD    Family History Family History  Problem Relation Age of Onset  . Stroke Father 3776  . Heart attack Father   . Coronary artery disease Father 1440  . Alcohol abuse Mother   . Breast cancer Sister   . Heart disease Sister   . Colon cancer Neg Hx     Social History Social History  Substance Use Topics  . Smoking status: Former Smoker    Packs/day: 1.00    Years: 20.00    Types: Cigarettes    Quit date: 01/25/1988  . Smokeless tobacco: Never Used  . Alcohol use 0.0 oz/week     Comment: 2 glasses every day     Allergies   Celecoxib; Clindamycin hcl; Rofecoxib; and Sertraline hcl   Review of Systems Review of Systems  All other systems reviewed and are negative.    Physical Exam Updated Vital Signs BP 98/68 (BP Location: Right Arm)   Pulse 95   Temp (!) 97.5 F (36.4 C) (Oral)   Resp 13   Ht 1.702 m (5\' 7" )   Wt 49 kg (108 lb)   SpO2 100%   BMI 16.92 kg/m   Physical Exam  Constitutional: No distress.  Elderly, frail  HENT:  Head: Normocephalic and atraumatic.  Right Ear: External ear normal.  Left Ear: External ear normal.  Eyes: Conjunctivae are normal. Right eye exhibits no discharge. Left eye exhibits no discharge. No scleral icterus.  Neck: Neck supple. No tracheal deviation present.  Cardiovascular: Normal rate, regular rhythm and normal heart sounds.   Pulmonary/Chest: Effort normal and breath sounds normal. No stridor. No respiratory distress.  Abdominal: Soft. She exhibits no distension. There is no tenderness. There is no guarding.  Musculoskeletal: She exhibits no edema.       Cervical back: Normal.       Thoracic back: She exhibits bony tenderness.       Lumbar back:  Normal.  Kyphosis, tenderness to palpation mid thoracic region, no erythema, no induration  Neurological: She is alert. Cranial nerve deficit: no gross deficits.  Skin: Skin is warm and dry. No rash noted.  Psychiatric: She has a normal mood and affect.  Nursing note and vitals reviewed.    ED Treatments / Results  Labs (all labs ordered are listed, but only abnormal results are displayed) Labs Reviewed - No data to display  EKG  EKG Interpretation None  Radiology Dg Thoracic Spine W/swimmers  Result Date: 08/27/2016 CLINICAL DATA:  83 year old female with chronic mid back pain. EXAM: THORACIC SPINE - 3 VIEWS COMPARISON:  03/07/2016 and prior radiographs FINDINGS: Diffuse osteopenia noted. Compression fractures of T11, T12 and L1 are again noted with T12 vertebral augmentation changes again identified. No definite acute fracture or subluxation noted. No suspicious bony lesions are present. IMPRESSION: Diffuse osteopenia without definite acute bony abnormality. Chronic compression fractures of T11, T12 and L1. Electronically Signed   By: Harmon Pier M.D.   On: 08/27/2016 11:35    Procedures Procedures (including critical care time)  Medications Ordered in ED Medications  morphine 2 MG/ML injection 2 mg (2 mg Intravenous Given 08/27/16 1053)     Initial Impression / Assessment and Plan / ED Course  I have reviewed the triage vital signs and the nursing notes.  Pertinent labs & imaging results that were available during my care of the patient were reviewed by me and considered in my medical decision making (see chart for details).  Clinical Course as of Aug 28 1147  Sat Aug 27, 2016  1147 Pain improved with treatment   [JK]    Clinical Course User Index [JK] Linwood Dibbles, MD  patient presented to the emergency room with recurrent thoracic back pain. She has a history of osteoporosis and compression fractures.  Patient has focal areas of tenderness on exam. She has limited  range of motion because of her pain. I doubt that her pain is from a referred etiology.  Patient's symptoms improved with treatment. X-rays do not show any acute injuries. We'll discharge home with lidocaine patch. Patient's daughter states that was helpful in the past. She also has an Ultram prescription to take.  Final Clinical Impressions(s) / ED Diagnoses   Final diagnoses:  Chronic midline thoracic back pain    New Prescriptions New Prescriptions   LIDOCAINE (LIDODERM) 5 %    Place 1 patch onto the skin daily. Remove & Discard patch within 12 hours or as directed by MD     Linwood Dibbles, MD 08/27/16 1149

## 2016-08-27 NOTE — ED Notes (Signed)
Pt O2 sats dropped to 88% after pain meds. Pt placed on O2.

## 2016-08-27 NOTE — ED Triage Notes (Signed)
Pt [facility] due to chronic back pain r/t osteo. Pt given tramadol by PCP, pain is persistent.

## 2016-08-27 NOTE — ED Notes (Signed)
Patient transported to X-ray 

## 2016-08-27 NOTE — ED Notes (Signed)
Pt attempted to get into wheelchair but unable to due to pain. Pt requesting transport by [facility] back to facility.

## 2016-08-27 NOTE — Discharge Instructions (Signed)
Continue the ultram, you can use the lidocaine patch as well to help with the pain, follow up with your orthopedic doctor

## 2016-08-30 ENCOUNTER — Ambulatory Visit (INDEPENDENT_AMBULATORY_CARE_PROVIDER_SITE_OTHER): Payer: Medicare Other | Admitting: Family Medicine

## 2016-08-30 ENCOUNTER — Encounter: Payer: Self-pay | Admitting: Family Medicine

## 2016-08-30 VITALS — BP 96/60 | Temp 97.8°F

## 2016-08-30 DIAGNOSIS — M546 Pain in thoracic spine: Secondary | ICD-10-CM | POA: Diagnosis not present

## 2016-08-30 DIAGNOSIS — G894 Chronic pain syndrome: Secondary | ICD-10-CM

## 2016-08-30 DIAGNOSIS — F424 Excoriation (skin-picking) disorder: Secondary | ICD-10-CM | POA: Insufficient documentation

## 2016-08-30 DIAGNOSIS — L981 Factitial dermatitis: Secondary | ICD-10-CM | POA: Diagnosis not present

## 2016-08-30 DIAGNOSIS — F0391 Unspecified dementia with behavioral disturbance: Secondary | ICD-10-CM | POA: Diagnosis not present

## 2016-08-30 MED ORDER — RISPERIDONE 0.5 MG PO TABS: ORAL_TABLET | ORAL | 5 refills | Status: AC

## 2016-08-30 MED ORDER — TRAMADOL HCL 50 MG PO TABS: 100.0000 mg | ORAL_TABLET | Freq: Four times a day (QID) | ORAL | 0 refills | Status: DC | PRN

## 2016-08-30 NOTE — Progress Notes (Signed)
   Subjective:    Patient ID: [name] [name], female    DOB: 03/28/1930, 83 y.o.   MRN: [id number]  HPI Here with her daughter to follow up an ER visit on 08-27-16 for middle back pain. She has a hx of vertebral compression fractures at T11, T12, and L1. No recent falls or trauma. At the ER she had spinal Xrays taken which showed the old fractures but nothing new. She has been taking Tramadol 50 mg (a single tablet) every 6 hours for pain and supplementing this with Tylenol. At the ER she was started on Lidoderm patches as well, but these do not seem to be helping much. Also her daughter says that her behavior at the nursing home has been more of a problem lately, especially at bedtime. She has become combative and it is very difficult to get her to bed. Also she has started to pick at her face with her fingernails, and she constantly now has bloody scabs over her face. She denies any itching and she seems to be totally unaware that she is doing this.    Review of Systems  Constitutional: Negative.   Respiratory: Negative.   Cardiovascular: Negative.   Musculoskeletal: Positive for back pain.  Psychiatric/Behavioral: Positive for agitation, behavioral problems and confusion.       Objective:   Physical Exam  Constitutional:  Alert, in a wheelchair   Cardiovascular: Normal rate, regular rhythm, normal heart sounds and intact distal pulses.   Pulmonary/Chest: Effort normal and breath sounds normal. No respiratory distress. She has no wheezes. She has no rales.  Musculoskeletal:  Her thoracic spine is very kyphotic. She is tender in the lower thoracic spine and upper lumbar spine areas. ROM is limited.   Neurological: She is alert.  Skin:  She has numerous superficial scrapes and scratches on the face and neck. Some are bleeding others have dried blood on them   Psychiatric: She has a normal mood and affect. Thought content normal.          Assessment & Plan:  She has middle back pain  and probably has had an acute exacerbation of her old compression fractures. I do not think the Lidoderm patch is helping much so we will stop this. Stay on Tylenol prn. We will write for her to increase the Tramadol to 100 mg (2 tablets) every 6 hours prn pain. She has developed some compulsive picking behaviors and we will start her on Risperidone 0.5 mg every night at suppertime. Hopefully this can also help with the sundowning behaviors at bedtime. The family will give us an update in one week. We can taper up on the Risperidone dose if needed.  Gershon CraneStephen Fry, MD

## 2016-08-30 NOTE — Patient Instructions (Signed)
WE NOW OFFER   [facility] FAST TRACK!!!  SAME DAY Appointments for ACUTE CARE  Such as: Sprains, Injuries, cuts, abrasions, rashes, muscle pain, joint pain, back pain Colds, flu, sore throats, headache, allergies, cough, fever  Ear pain, sinus and eye infections Abdominal pain, nausea, vomiting, diarrhea, upset stomach Animal/insect bites  3 Easy Ways to Schedule: Walk-In Scheduling Call in scheduling Mychart Sign-up: [email or web address]         

## 2016-09-03 ENCOUNTER — Inpatient Hospital Stay (HOSPITAL_COMMUNITY)
Admission: EM | Admit: 2016-09-03 | Discharge: 2016-09-13 | DRG: 871 | Disposition: A | Discharge status: Nursing Home (Includes ICF) | Payer: Medicare Other | Attending: Internal Medicine | Admitting: Internal Medicine

## 2016-09-03 ENCOUNTER — Inpatient Hospital Stay (HOSPITAL_COMMUNITY): Payer: Medicare Other

## 2016-09-03 ENCOUNTER — Emergency Department (HOSPITAL_COMMUNITY): Payer: Medicare Other

## 2016-09-03 ENCOUNTER — Encounter (HOSPITAL_COMMUNITY): Payer: Self-pay

## 2016-09-03 DIAGNOSIS — R64 Cachexia: Secondary | ICD-10-CM | POA: Diagnosis present

## 2016-09-03 DIAGNOSIS — Z681 Body mass index (BMI) 19 or less, adult: Secondary | ICD-10-CM | POA: Diagnosis not present

## 2016-09-03 DIAGNOSIS — E876 Hypokalemia: Secondary | ICD-10-CM | POA: Diagnosis present

## 2016-09-03 DIAGNOSIS — B9620 Unspecified Escherichia coli [E. coli] as the cause of diseases classified elsewhere: Secondary | ICD-10-CM | POA: Diagnosis present

## 2016-09-03 DIAGNOSIS — D649 Anemia, unspecified: Secondary | ICD-10-CM | POA: Diagnosis present

## 2016-09-03 DIAGNOSIS — Z87891 Personal history of nicotine dependence: Secondary | ICD-10-CM

## 2016-09-03 DIAGNOSIS — J84112 Idiopathic pulmonary fibrosis: Secondary | ICD-10-CM | POA: Diagnosis present

## 2016-09-03 DIAGNOSIS — R402142 Coma scale, eyes open, spontaneous, at arrival to emergency department: Secondary | ICD-10-CM | POA: Diagnosis present

## 2016-09-03 DIAGNOSIS — F0390 Unspecified dementia without behavioral disturbance: Secondary | ICD-10-CM | POA: Diagnosis present

## 2016-09-03 DIAGNOSIS — Z66 Do not resuscitate: Secondary | ICD-10-CM | POA: Diagnosis present

## 2016-09-03 DIAGNOSIS — F329 Major depressive disorder, single episode, unspecified: Secondary | ICD-10-CM | POA: Diagnosis present

## 2016-09-03 DIAGNOSIS — T80219A Unspecified infection due to central venous catheter, initial encounter: Secondary | ICD-10-CM | POA: Diagnosis not present

## 2016-09-03 DIAGNOSIS — Z881 Allergy status to other antibiotic agents status: Secondary | ICD-10-CM

## 2016-09-03 DIAGNOSIS — E785 Hyperlipidemia, unspecified: Secondary | ICD-10-CM | POA: Diagnosis present

## 2016-09-03 DIAGNOSIS — R32 Unspecified urinary incontinence: Secondary | ICD-10-CM | POA: Diagnosis present

## 2016-09-03 DIAGNOSIS — I420 Dilated cardiomyopathy: Secondary | ICD-10-CM | POA: Diagnosis present

## 2016-09-03 DIAGNOSIS — I482 Chronic atrial fibrillation: Secondary | ICD-10-CM | POA: Diagnosis present

## 2016-09-03 DIAGNOSIS — Z452 Encounter for adjustment and management of vascular access device: Secondary | ICD-10-CM

## 2016-09-03 DIAGNOSIS — Z515 Encounter for palliative care: Secondary | ICD-10-CM | POA: Diagnosis not present

## 2016-09-03 DIAGNOSIS — A4101 Sepsis due to Methicillin susceptible Staphylococcus aureus: Secondary | ICD-10-CM | POA: Diagnosis not present

## 2016-09-03 DIAGNOSIS — G8929 Other chronic pain: Secondary | ICD-10-CM | POA: Diagnosis present

## 2016-09-03 DIAGNOSIS — K838 Other specified diseases of biliary tract: Secondary | ICD-10-CM | POA: Diagnosis not present

## 2016-09-03 DIAGNOSIS — R6521 Severe sepsis with septic shock: Secondary | ICD-10-CM | POA: Diagnosis present

## 2016-09-03 DIAGNOSIS — Z8249 Family history of ischemic heart disease and other diseases of the circulatory system: Secondary | ICD-10-CM

## 2016-09-03 DIAGNOSIS — K830 Cholangitis: Secondary | ICD-10-CM | POA: Diagnosis present

## 2016-09-03 DIAGNOSIS — K8001 Calculus of gallbladder with acute cholecystitis with obstruction: Secondary | ICD-10-CM | POA: Diagnosis present

## 2016-09-03 DIAGNOSIS — G4733 Obstructive sleep apnea (adult) (pediatric): Secondary | ICD-10-CM | POA: Diagnosis present

## 2016-09-03 DIAGNOSIS — Z4659 Encounter for fitting and adjustment of other gastrointestinal appliance and device: Secondary | ICD-10-CM | POA: Diagnosis not present

## 2016-09-03 DIAGNOSIS — R296 Repeated falls: Secondary | ICD-10-CM | POA: Diagnosis present

## 2016-09-03 DIAGNOSIS — Z7901 Long term (current) use of anticoagulants: Secondary | ICD-10-CM

## 2016-09-03 DIAGNOSIS — B961 Klebsiella pneumoniae [K. pneumoniae] as the cause of diseases classified elsewhere: Secondary | ICD-10-CM | POA: Diagnosis present

## 2016-09-03 DIAGNOSIS — A419 Sepsis, unspecified organism: Secondary | ICD-10-CM | POA: Diagnosis not present

## 2016-09-03 DIAGNOSIS — R7881 Bacteremia: Secondary | ICD-10-CM | POA: Diagnosis not present

## 2016-09-03 DIAGNOSIS — T80219D Unspecified infection due to central venous catheter, subsequent encounter: Secondary | ICD-10-CM | POA: Diagnosis not present

## 2016-09-03 DIAGNOSIS — L89151 Pressure ulcer of sacral region, stage 1: Secondary | ICD-10-CM | POA: Diagnosis present

## 2016-09-03 DIAGNOSIS — R402222 Coma scale, best verbal response, incomprehensible words, at arrival to emergency department: Secondary | ICD-10-CM | POA: Diagnosis present

## 2016-09-03 DIAGNOSIS — R402312 Coma scale, best motor response, none, at arrival to emergency department: Secondary | ICD-10-CM | POA: Diagnosis present

## 2016-09-03 DIAGNOSIS — I110 Hypertensive heart disease with heart failure: Secondary | ICD-10-CM | POA: Diagnosis present

## 2016-09-03 DIAGNOSIS — E039 Hypothyroidism, unspecified: Secondary | ICD-10-CM | POA: Diagnosis present

## 2016-09-03 DIAGNOSIS — M353 Polymyalgia rheumatica: Secondary | ICD-10-CM | POA: Diagnosis present

## 2016-09-03 DIAGNOSIS — R40243 Glasgow coma scale score 3-8, unspecified time: Secondary | ICD-10-CM

## 2016-09-03 DIAGNOSIS — R402433 Glasgow coma scale score 3-8, at hospital admission: Secondary | ICD-10-CM | POA: Diagnosis not present

## 2016-09-03 DIAGNOSIS — A4150 Gram-negative sepsis, unspecified: Principal | ICD-10-CM | POA: Diagnosis present

## 2016-09-03 DIAGNOSIS — K810 Acute cholecystitis: Secondary | ICD-10-CM | POA: Diagnosis not present

## 2016-09-03 DIAGNOSIS — G9340 Encephalopathy, unspecified: Secondary | ICD-10-CM | POA: Diagnosis present

## 2016-09-03 DIAGNOSIS — N179 Acute kidney failure, unspecified: Secondary | ICD-10-CM | POA: Diagnosis present

## 2016-09-03 DIAGNOSIS — M810 Age-related osteoporosis without current pathological fracture: Secondary | ICD-10-CM | POA: Diagnosis present

## 2016-09-03 DIAGNOSIS — K8000 Calculus of gallbladder with acute cholecystitis without obstruction: Secondary | ICD-10-CM | POA: Diagnosis not present

## 2016-09-03 DIAGNOSIS — I2510 Atherosclerotic heart disease of native coronary artery without angina pectoris: Secondary | ICD-10-CM | POA: Diagnosis present

## 2016-09-03 DIAGNOSIS — K219 Gastro-esophageal reflux disease without esophagitis: Secondary | ICD-10-CM | POA: Diagnosis present

## 2016-09-03 DIAGNOSIS — Z79899 Other long term (current) drug therapy: Secondary | ICD-10-CM

## 2016-09-03 DIAGNOSIS — J9601 Acute respiratory failure with hypoxia: Secondary | ICD-10-CM | POA: Diagnosis present

## 2016-09-03 DIAGNOSIS — F419 Anxiety disorder, unspecified: Secondary | ICD-10-CM | POA: Diagnosis present

## 2016-09-03 DIAGNOSIS — I5022 Chronic systolic (congestive) heart failure: Secondary | ICD-10-CM | POA: Diagnosis present

## 2016-09-03 DIAGNOSIS — Z79891 Long term (current) use of opiate analgesic: Secondary | ICD-10-CM

## 2016-09-03 DIAGNOSIS — K589 Irritable bowel syndrome without diarrhea: Secondary | ICD-10-CM | POA: Diagnosis present

## 2016-09-03 DIAGNOSIS — Z7189 Other specified counseling: Secondary | ICD-10-CM | POA: Diagnosis not present

## 2016-09-03 DIAGNOSIS — K760 Fatty (change of) liver, not elsewhere classified: Secondary | ICD-10-CM | POA: Diagnosis present

## 2016-09-03 DIAGNOSIS — L8990 Pressure ulcer of unspecified site, unspecified stage: Secondary | ICD-10-CM | POA: Insufficient documentation

## 2016-09-03 DIAGNOSIS — K8309 Other cholangitis: Secondary | ICD-10-CM

## 2016-09-03 DIAGNOSIS — K819 Cholecystitis, unspecified: Secondary | ICD-10-CM | POA: Diagnosis not present

## 2016-09-03 DIAGNOSIS — Z888 Allergy status to other drugs, medicaments and biological substances status: Secondary | ICD-10-CM

## 2016-09-03 DIAGNOSIS — F0391 Unspecified dementia with behavioral disturbance: Secondary | ICD-10-CM | POA: Diagnosis not present

## 2016-09-03 LAB — I-STAT ARTERIAL BLOOD GAS, ED
Acid-Base Excess: 4 mmol/L — ABNORMAL HIGH (ref 0.0–2.0)
BICARBONATE: 29.2 mmol/L — AB (ref 20.0–28.0)
O2 Saturation: 100 %
PCO2 ART: 49.6 mmHg — AB (ref 32.0–48.0)
PO2 ART: 446 mmHg — AB (ref 83.0–108.0)
TCO2: 31 mmol/L (ref 0–100)
pH, Arterial: 7.38 (ref 7.350–7.450)

## 2016-09-03 LAB — COMPREHENSIVE METABOLIC PANEL
ALK PHOS: 335 U/L — AB (ref 38–126)
ALT: 93 U/L — AB (ref 14–54)
AST: 96 U/L — AB (ref 15–41)
Albumin: 3.1 g/dL — ABNORMAL LOW (ref 3.5–5.0)
Anion gap: 9 (ref 5–15)
BUN: 19 mg/dL (ref 6–20)
CHLORIDE: 103 mmol/L (ref 101–111)
CO2: 27 mmol/L (ref 22–32)
CREATININE: 0.83 mg/dL (ref 0.44–1.00)
Calcium: 8.5 mg/dL — ABNORMAL LOW (ref 8.9–10.3)
GFR calc Af Amer: >60 mL/min (ref >60)
GFR calc non Af Amer: >60 mL/min (ref >60)
Glucose, Bld: 119 mg/dL — ABNORMAL HIGH (ref 65–99)
Potassium: 4 mmol/L (ref 3.5–5.1)
SODIUM: 139 mmol/L (ref 135–145)
Total Bilirubin: 4.8 mg/dL — ABNORMAL HIGH (ref 0.3–1.2)
Total Protein: 8.2 g/dL — ABNORMAL HIGH (ref 6.5–8.1)

## 2016-09-03 LAB — GLUCOSE, CAPILLARY: GLUCOSE-CAPILLARY: 121 mg/dL — AB (ref 65–99)

## 2016-09-03 LAB — I-STAT CHEM 8, ED
BUN: 25 mg/dL — ABNORMAL HIGH (ref 6–20)
Calcium, Ion: 1 mmol/L — ABNORMAL LOW (ref 1.15–1.40)
Chloride: 101 mmol/L (ref 101–111)
Creatinine, Ser: 0.7 mg/dL (ref 0.44–1.00)
Glucose, Bld: 114 mg/dL — ABNORMAL HIGH (ref 65–99)
HCT: 42 % (ref 36.0–46.0)
Hemoglobin: 14.3 g/dL (ref 12.0–15.0)
Potassium: 4.1 mmol/L (ref 3.5–5.1)
Sodium: 141 mmol/L (ref 135–145)
TCO2: 28 mmol/L (ref 0–100)

## 2016-09-03 LAB — CBC WITH DIFFERENTIAL/PLATELET
Basophils Absolute: 0 10*3/uL (ref 0.0–0.1)
Basophils Relative: 0 %
EOS ABS: 0 10*3/uL (ref 0.0–0.7)
EOS PCT: 0 %
HCT: 38.9 % (ref 36.0–46.0)
Hemoglobin: 12.5 g/dL (ref 12.0–15.0)
LYMPHS ABS: 0.3 10*3/uL — AB (ref 0.7–4.0)
Lymphocytes Relative: 4 %
MCH: 33.3 pg (ref 26.0–34.0)
MCHC: 32.1 g/dL (ref 30.0–36.0)
MCV: 103.7 fL — ABNORMAL HIGH (ref 78.0–100.0)
MONOS PCT: 2 %
Monocytes Absolute: 0.2 10*3/uL (ref 0.1–1.0)
Neutro Abs: 8.3 10*3/uL — ABNORMAL HIGH (ref 1.7–7.7)
Neutrophils Relative %: 94 %
PLATELETS: 206 10*3/uL (ref 150–400)
RBC: 3.75 MIL/uL — ABNORMAL LOW (ref 3.87–5.11)
RDW: 20 % — ABNORMAL HIGH (ref 11.5–15.5)
WBC: 8.8 10*3/uL (ref 4.0–10.5)

## 2016-09-03 LAB — I-STAT CG4 LACTIC ACID, ED
LACTIC ACID, VENOUS: 2.68 mmol/L — AB (ref 0.5–1.9)
Lactic Acid, Venous: 3.15 mmol/L (ref 0.5–1.9)

## 2016-09-03 LAB — LACTIC ACID, PLASMA
Lactic Acid, Venous: 2.2 mmol/L (ref 0.5–1.9)
Lactic Acid, Venous: 1.9 mmol/L (ref 0.5–1.9)

## 2016-09-03 LAB — TSH: TSH: 0.751 u[IU]/mL (ref 0.350–4.500)

## 2016-09-03 LAB — TROPONIN I
TROPONIN I: 0.06 ng/mL — AB (ref <0.03)
TROPONIN I: 0.05 ng/mL — AB (ref <0.03)

## 2016-09-03 LAB — URINALYSIS, ROUTINE W REFLEX MICROSCOPIC
Bacteria, UA: NONE SEEN
Bilirubin Urine: NEGATIVE
Glucose, UA: NEGATIVE mg/dL
Hgb urine dipstick: NEGATIVE
Ketones, ur: NEGATIVE mg/dL
Leukocytes, UA: NEGATIVE
Nitrite: NEGATIVE
Protein, ur: 30 mg/dL — AB
Specific Gravity, Urine: 1.018 (ref 1.005–1.030)
Squamous Epithelial / HPF: NONE SEEN
WBC, UA: NONE SEEN WBC/hpf (ref 0–5)
pH: 6 (ref 5.0–8.0)

## 2016-09-03 LAB — CORTISOL: Cortisol, Plasma: 41.4 ug/dL

## 2016-09-03 LAB — PROCALCITONIN: PROCALCITONIN: 5.97 ng/mL

## 2016-09-03 LAB — MRSA PCR SCREENING: MRSA by PCR: NEGATIVE

## 2016-09-03 MED ORDER — PIPERACILLIN-TAZOBACTAM 3.375 G IVPB 30 MIN
3.3750 g | Freq: Once | INTRAVENOUS | Status: AC
  Administered 2016-09-03: 3.375 g via INTRAVENOUS
  Filled 2016-09-03: qty 50

## 2016-09-03 MED ORDER — PIPERACILLIN-TAZOBACTAM 3.375 G IVPB 30 MIN: 3.3750 g | Freq: Three times a day (TID) | INTRAVENOUS | Status: DC

## 2016-09-03 MED ORDER — IOPAMIDOL (ISOVUE-300) INJECTION 61%
INTRAVENOUS | Status: AC
  Administered 2016-09-03: 80 mL
  Filled 2016-09-03: qty 100

## 2016-09-03 MED ORDER — MIDAZOLAM HCL 2 MG/2ML IJ SOLN
1.0000 mg | INTRAMUSCULAR | Status: DC | PRN
  Administered 2016-09-03 – 2016-09-04 (×2): 1 mg via INTRAVENOUS
  Filled 2016-09-03: qty 2

## 2016-09-03 MED ORDER — DEXTROSE 5 % IV SOLN
2.0000 g | Freq: Two times a day (BID) | INTRAVENOUS | Status: DC
  Administered 2016-09-03: 2 g via INTRAVENOUS
  Filled 2016-09-03 (×2): qty 2

## 2016-09-03 MED ORDER — NOREPINEPHRINE BITARTRATE 1 MG/ML IV SOLN
0.0000 ug/min | Freq: Once | INTRAVENOUS | Status: AC
  Administered 2016-09-03: 2 ug/min via INTRAVENOUS
  Filled 2016-09-03: qty 4

## 2016-09-03 MED ORDER — PANTOPRAZOLE SODIUM 40 MG IV SOLR
40.0000 mg | Freq: Every day | INTRAVENOUS | Status: DC
  Administered 2016-09-03 – 2016-09-06 (×4): 40 mg via INTRAVENOUS
  Filled 2016-09-03 (×4): qty 40

## 2016-09-03 MED ORDER — NOREPINEPHRINE BITARTRATE 1 MG/ML IV SOLN
0.0000 ug/min | INTRAVENOUS | Status: DC
  Administered 2016-09-03: 6 ug/min via INTRAVENOUS
  Administered 2016-09-04: 3 ug/min via INTRAVENOUS
  Filled 2016-09-03: qty 4

## 2016-09-03 MED ORDER — SODIUM CHLORIDE 0.9 % IV BOLUS (SEPSIS)
1000.0000 mL | Freq: Once | INTRAVENOUS | Status: AC
  Administered 2016-09-03: 1000 mL via INTRAVENOUS

## 2016-09-03 MED ORDER — METRONIDAZOLE IN NACL 5-0.79 MG/ML-% IV SOLN
500.0000 mg | Freq: Three times a day (TID) | INTRAVENOUS | Status: DC
  Administered 2016-09-03: 500 mg via INTRAVENOUS
  Filled 2016-09-03 (×2): qty 100

## 2016-09-03 MED ORDER — ETOMIDATE 2 MG/ML IV SOLN
INTRAVENOUS | Status: AC | PRN
  Administered 2016-09-03: 10 mg via INTRAVENOUS

## 2016-09-03 MED ORDER — ROCURONIUM BROMIDE 50 MG/5ML IV SOLN
INTRAVENOUS | Status: AC | PRN
  Administered 2016-09-03: 49 mg via INTRAVENOUS

## 2016-09-03 MED ORDER — CHLORHEXIDINE GLUCONATE 0.12 % MT SOLN
15.0000 mL | Freq: Two times a day (BID) | OROMUCOSAL | Status: DC
  Administered 2016-09-03 – 2016-09-13 (×15): 15 mL via OROMUCOSAL
  Filled 2016-09-03 (×2): qty 15

## 2016-09-03 MED ORDER — SODIUM CHLORIDE 0.9 % IV SOLN: 250.0000 mL | INTRAVENOUS | Status: DC | PRN

## 2016-09-03 MED ORDER — PIPERACILLIN-TAZOBACTAM 3.375 G IVPB
3.3750 g | Freq: Three times a day (TID) | INTRAVENOUS | Status: DC
  Administered 2016-09-03 – 2016-09-06 (×8): 3.375 g via INTRAVENOUS
  Filled 2016-09-03 (×9): qty 50

## 2016-09-03 MED ORDER — VANCOMYCIN HCL 500 MG IV SOLR: 500.0000 mg | Freq: Two times a day (BID) | INTRAVENOUS | Status: DC

## 2016-09-03 MED ORDER — MIDAZOLAM HCL 2 MG/2ML IJ SOLN
1.0000 mg | INTRAMUSCULAR | Status: DC | PRN
  Administered 2016-09-04: 1 mg via INTRAVENOUS
  Filled 2016-09-03 (×2): qty 2

## 2016-09-03 MED ORDER — FENTANYL CITRATE (PF) 100 MCG/2ML IJ SOLN
50.0000 ug | INTRAMUSCULAR | Status: DC | PRN
  Filled 2016-09-03: qty 2

## 2016-09-03 MED ORDER — ACETAMINOPHEN 650 MG RE SUPP
650.0000 mg | Freq: Once | RECTAL | Status: AC
  Administered 2016-09-03: 650 mg via RECTAL
  Filled 2016-09-03: qty 1

## 2016-09-03 MED ORDER — ORAL CARE MOUTH RINSE
15.0000 mL | Freq: Four times a day (QID) | OROMUCOSAL | Status: DC
  Administered 2016-09-04 – 2016-09-13 (×28): 15 mL via OROMUCOSAL

## 2016-09-03 MED ORDER — LACTATED RINGERS IV SOLN
INTRAVENOUS | Status: DC
  Administered 2016-09-03: 18:00:00 via INTRAVENOUS
  Administered 2016-09-03: 300 mL/h via INTRAVENOUS
  Administered 2016-09-04: 04:00:00 via INTRAVENOUS

## 2016-09-03 MED ORDER — VANCOMYCIN HCL IN DEXTROSE 1-5 GM/200ML-% IV SOLN
1000.0000 mg | INTRAVENOUS | Status: DC
  Filled 2016-09-03: qty 200

## 2016-09-03 MED ORDER — FENTANYL CITRATE (PF) 100 MCG/2ML IJ SOLN
50.0000 ug | INTRAMUSCULAR | Status: DC | PRN
  Administered 2016-09-03 – 2016-09-04 (×2): 50 ug via INTRAVENOUS
  Filled 2016-09-03: qty 2

## 2016-09-03 MED ORDER — SODIUM CHLORIDE 0.9 % IV BOLUS (SEPSIS)
2000.0000 mL | Freq: Once | INTRAVENOUS | Status: AC
  Administered 2016-09-03: 2000 mL via INTRAVENOUS

## 2016-09-03 MED ORDER — FENTANYL CITRATE (PF) 100 MCG/2ML IJ SOLN: 50.0000 ug | Freq: Once | INTRAMUSCULAR | Status: DC

## 2016-09-03 MED ORDER — VANCOMYCIN HCL IN DEXTROSE 1-5 GM/200ML-% IV SOLN
1000.0000 mg | Freq: Once | INTRAVENOUS | Status: AC
  Administered 2016-09-03: 1000 mg via INTRAVENOUS
  Filled 2016-09-03: qty 200

## 2016-09-03 NOTE — ED Notes (Signed)
Did in and out cath on patient patient is now resting with call bell in reach

## 2016-09-03 NOTE — Op Note (Signed)
Central Venous Catheter Placement  A central venous catheter was placed for the infusion of pressors. Informed consent obtained and time out performed. The area over the left subclavian was prepped with Chlorhexidine and widely draped. Sterile garb was donned and the subclavian vein easily cannulated. A wire was gently passed, the tract dilated and a 7FR 20 cm triple lumen catheter paced to 17 cm and sutured in place. There was good flow from all ports. CXR is pending.  Penny PiaWJ Gray MD

## 2016-09-03 NOTE — ED Notes (Signed)
Despite nasal cannula, O2 sats continue to drop, non-rebreather placed.  Dr. Adela LankFloyd at bedside and speaking with daughter to discuss DNR status.

## 2016-09-03 NOTE — ED Notes (Signed)
O2 sat noted to be 84-85% on room air, O2 applied via nasal cannula at 3 liters, sat increased to 91%.

## 2016-09-03 NOTE — H&P (Signed)
PULMONARY / CRITICAL CARE MEDICINE   Name: [name] [name] MRN: [id number] DOB: 10-29-1930    ADMISSION DATE:  09/03/2016   CHIEF COMPLAINT:  Fever and Altered Mental status  HISTORY OF PRESENT ILLNESS:   This is an 83 year old who at baseline suffers from moderate dementia manifested as memory loss, frequent falls and indifference to food. She was brought to the due to decreased mentation and was found to have a rectal temperature greater than 103. She has reportedly has not had cough dyspnea or abdominal pain. She has been incontinent of urine, and has been having back pain since a recent fall. In the ER she was felt to be apneic and was intubated. She has become hypotensive and received 3 liters of crystalloid, she remains hypotensive and low dose levophed has been added via peripheral IV.  PAST MEDICAL HISTORY :  She  has a past medical history of Anemia; Anxiety; Arthritis; Atrial fibrillation (HCC); CHF (congestive heart failure) (HCC); Dementia; Depression; Diverticulosis of colon; History of alcohol abuse; Hyperlipidemia; Hypertension; IPF (idiopathic pulmonary fibrosis) (HCC); Irritable bowel syndrome; OSA (obstructive sleep apnea); Osteoporosis; PMR (polymyalgia rheumatica) (HCC); Postherpetic neuralgia; Spinal stenosis; Thyroid disease; Vertebral fracture; and Vitamin D deficiency.  PAST SURGICAL HISTORY: She  has a past surgical history that includes Rotator cuff repair; Breast biopsy; Vertebroplasty; Appendectomy; Cataract extraction; Knee arthroscopy (10/2007 and 10/2010); Dilation and curettage of uterus; and Replacement total knee (august 2012).  Allergies  Allergen Reactions  . Celecoxib     REACTION: unspecified  . Clindamycin Hcl Diarrhea  . Rofecoxib     REACTION: unspecified  . Sertraline Hcl     REACTION: unspecified    No current facility-administered medications on file prior to encounter.    Current Outpatient Prescriptions on File Prior to Encounter  Medication  Sig  . acetaminophen (TYLENOL) 500 MG tablet Take 1,000 mg by mouth 2 (two) times daily as needed.  . carvedilol (COREG) 3.125 MG tablet TAKE 1 TABLET TWICE DAILY.  Marland Kitchen ELIQUIS 2.5 MG TABS tablet TAKE 1 TABLET TWICE DAILY.  Marland Kitchen escitalopram (LEXAPRO) 10 MG tablet Take 1 tablet (10 mg total) by mouth daily.  . ferrous sulfate 325 (65 FE) MG tablet TAKE 1 TABLET TWICE DAILY.  . furosemide (LASIX) 40 MG tablet Take 1 tablet (40 mg total) by mouth daily.  Marland Kitchen gabapentin (NEURONTIN) 100 MG capsule TAKE 1 CAPSULE EVERY DAY.  Marland Kitchen levothyroxine (SYNTHROID, LEVOTHROID) 75 MCG tablet Take 75 mcg by mouth daily before breakfast.  . Magnesium 400 MG CAPS Take 800 mg by mouth daily.  . pantoprazole (PROTONIX) 40 MG tablet Take 1 tablet (40 mg total) by mouth daily.  . potassium chloride (KLOR-CON) 20 MEQ packet Take 20 mEq by mouth 2 (two) times daily.  . risperiDONE (RISPERDAL) 0.5 MG tablet Once daily at suppertime  . traMADol (ULTRAM) 50 MG tablet Take 2 tablets (100 mg total) by mouth every 6 (six) hours as needed for moderate pain.    FAMILY HISTORY:  Her indicated that her mother is deceased. She indicated that her father is deceased. She indicated that the status of her neg hx is unknown.    SOCIAL HISTORY: She  reports that she quit smoking about 28 years ago. Her smoking use included Cigarettes. She has a 20.00 pack-year smoking history. She has never used smokeless tobacco. She reports that she drinks alcohol. She reports that she does not use drugs.  REVIEW OF SYSTEMS:   General: significant weight loss in the past  year CNS: short term memory loss, no known hx of CVA or seizure. Difficulty ambulating Resp: ILD by report but no dyspnea, although it appears exercise is very limited. CV: hx of CHF. Daughter reports edema is currently well controlled. Endo: no DM. Hypothroidism as noted  SUBJECTIVE:  As per HPI  VITAL SIGNS: BP (!) 85/54   Pulse (!) 119   Temp (!) 103.8 F (39.9 C) (Rectal)    Resp 11   SpO2 100%   HEMODYNAMICS:    VENTILATOR SETTINGS: Vent Mode: PRVC FiO2 (%):  [100 %] 100 % Set Rate:  [16 bmp] 16 bmp Vt Set:  [500 mL] 500 mL PEEP:  [5 cmH20] 5 cmH20 Plateau Pressure:  [26 cmH20] 26 cmH20  INTAKE / OUTPUT: No intake/output data recorded.  PHYSICAL EXAMINATION: General: Cachectic intubated elderly female. Still pharmacologically paralyzed during my exam. Neuro:  Pupils equal and reactive, still flaccid post paralytic Cardiovascular:  Substantial JVD. Trace dependent edema. Limbs pink and warm. Irreg irreg without m, g, or rub  Lungs:  Symmetric air movement, scattered rhonchi, no wheezes.Good air movement throughout Abdomen:  Flat to scafoid. No organomegaly or masses. Anicteric Musculoskeletal: no gross deformity Skin: healed macules under both sides of mandible  LABS:  BMET  Recent Labs Lab 09/03/16 1137 09/03/16 1204  NA 139 141  K 4.0 4.1  CL 103 101  CO2 27  --   BUN 19 25*  CREATININE 0.83 0.70  GLUCOSE 119* 114*    Electrolytes  Recent Labs Lab 09/03/16 1137  CALCIUM 8.5*    CBC  Recent Labs Lab 09/03/16 1137 09/03/16 1204  WBC 8.8  --   HGB 12.5 14.3  HCT 38.9 42.0  PLT 206  --     Coag's No results for input(s): APTT, INR in the last 168 hours.  Sepsis Markers  Recent Labs Lab 09/03/16 1144  LATICACIDVEN 3.15*    ABG  Recent Labs Lab 09/03/16 1431  PHART 7.380  PCO2ART 49.6*  PO2ART 446.0*    Liver Enzymes  Recent Labs Lab 09/03/16 1137  AST 96*  ALT 93*  ALKPHOS 335*  BILITOT 4.8*  ALBUMIN 3.1*    Cardiac Enzymes No results for input(s): TROPONINI, PROBNP in the last 168 hours.  Glucose No results for input(s): GLUCAP in the last 168 hours.  Imaging Dg Chest Portable 1 View  Result Date: 09/03/2016 CLINICAL DATA:  Intubation, hypertension, CHF, idiopathic pulmonary fibrosis, atrial fibrillation EXAM: PORTABLE CHEST 1 VIEW COMPARISON:  Portable exam 1312 hours compared to 1138  hours FINDINGS: Tip of endotracheal tube projects 3.2 cm above carina. Stable heart size. Slightly rotated to the RIGHT which may account for prominence of the RIGHT hilum. Diffuse interstitial infiltrates compatible pulmonary fibrosis. No definite superimposed acute infiltrate, pleural effusion or pneumothorax. Bones demineralized. IMPRESSION: Pulmonary fibrosis. Endotracheal tube tip projects 3.2 cm above carina. Electronically Signed   By: Ulyses SouthwardMark  Boles M.D.   On: 09/03/2016 13:39   Dg Chest Port 1 View  Result Date: 09/03/2016 CLINICAL DATA:  Fever and altered mental status. EXAM: PORTABLE CHEST 1 VIEW COMPARISON:  03/07/2016 and CT 02/07/2014 FINDINGS: Patient slightly rotated to the right. Lungs are adequately inflated demonstrate diffuse predominant peripheral bilateral interstitial disease compatible with known pulmonary fibrosis. No focal confluent airspace disease. No definite effusion. Stable cardiomegaly. Moderate size hiatal hernia. Calcified plaque over the aortic arch. Remainder of the exam is unchanged. IMPRESSION: No acute cardiopulmonary disease. Evidence of known pulmonary fibrosis. Stable cardiomegaly. Aortic Atherosclerosis (ICD10-I70.0). Electronically  Signed   By: Elberta Fortis M.D.   On: 09/03/2016 11:52       CULTURES: Blood cultures time 2  8/11>>  ANTIBIOTICS: Vanco, Flagyl, Rocephin   LINES/TUBES: ETT 8/11    ASSESSMENT / PLAN:  Hypotension. Working Dx is septic shock, however there is no clear source of infection and she has connective tissue disease which may be the source of fever. Triturating levophed up until we can get a central venous sat, echo, etc to sort out how much of a cardiac component there may be and if there is any infectious component at all. Procalcitonin pending, as are blood cultures. Broadly covering with Vanco, Flagyl and Rocephin to cover potential abdominal and CNS sources. U/A is clean. Will scan chest abdomen and pelvis when  stable.  Altered mental status. Likely metabolic issue superimposed on baseline dementia. Will CT head to R/O structural insult. Check B12 due to high MCV  Disposition. Situation discussed with family who are aware that there is a significant mortality rate associated with all forms of shock. There wish me to provide all support short of CPR at least until other family members arrive.    Penny Pia, MD  Pulmonary and Critical Care Medicine [facility] Pager: [phone]  09/03/2016, 2:50 PM   Greater than 45 minutes has been spent in the care of this acutely ill patient today, not counting time performing procedures.

## 2016-09-03 NOTE — ED Triage Notes (Signed)
[facility] and altered mental status.

## 2016-09-03 NOTE — ED Provider Notes (Signed)
[facility]-EMERGENCY DEPT Provider Note   CSN: [id number] Arrival date & time: 09/03/16  1127     History   Chief Complaint Chief Complaint  Patient presents with  . Altered Mental Status    HPI [name] [name] is a 83 y.o. female.  83 yo F with a chief complaint of altered mental status. Going on for the past couple days. The patient was initially complaining of back pain over the past week or so. At a plain film was negative. All her family physician and had an increased dose of tramadol as well as started on risperidone. For the past couple days she's been increasingly confused. They deny cough congestion vomiting or diarrhea. Denies abdominal pain. Denied dysuria. This morning the patient was speaking gibberish and so she was sent to the ED. Level V caveat AMS   The history is provided by the nursing home, the EMS personnel and a relative.  Illness  This is a new problem. The current episode started 2 days ago. The problem occurs constantly. The problem has been rapidly worsening. Pertinent negatives include no chest pain, no headaches and no shortness of breath. Nothing aggravates the symptoms. Nothing relieves the symptoms. She has tried nothing for the symptoms. The treatment provided no relief.    Past Medical History:  Diagnosis Date  . Anemia   . Anxiety   . Arthritis   . Atrial fibrillation (HCC)   . CHF (congestive heart failure) (HCC)   . Dementia   . Depression   . Diverticulosis of colon   . History of alcohol abuse   . Hyperlipidemia   . Hypertension   . IPF (idiopathic pulmonary fibrosis) (HCC)   . Irritable bowel syndrome   . OSA (obstructive sleep apnea)   . Osteoporosis   . PMR (polymyalgia rheumatica) (HCC)   . Postherpetic neuralgia   . Spinal stenosis   . Thyroid disease   . Vertebral fracture   . Vitamin D deficiency     Patient Active Problem List   Diagnosis Date Noted  . Septic shock (HCC) 09/03/2016  . Thoracic spine pain 08/30/2016  .  Compulsive skin picking 08/30/2016  . Permanent atrial fibrillation (HCC) 05/09/2016  . Dilated cardiomyopathy (HCC) 05/09/2016  . Dementia 02/04/2016  . Pulmonary edema 11/20/2015  . Chronic systolic heart failure (HCC) 11/20/2015  . Hyperbilirubinemia 11/20/2015  . Easy bruising 11/10/2015  . Bilateral leg edema 10/23/2015  . Urinary incontinence 10/23/2015  . PAF (paroxysmal atrial fibrillation) (HCC) 05/27/2015  . Fatigue 06/03/2014  . Hypothyroidism 04/02/2014  . Absolute anemia 01/01/2014  . Idiopathic pulmonary fibrosis (HCC) 01/01/2014  . OA (osteoarthritis) of knee 07/02/2010  . Memory loss 07/02/2010  . Hyperlipidemia 03/08/2010  . Vitamin D deficiency 10/05/2009  . Obstructive sleep apnea 10/08/2007  . SYNDROME, CHRONIC PAIN 11/16/2006  . DIVERTICULOSIS, COLON 06/06/2006  . Depression 04/30/2006  . Essential hypertension 04/30/2006  . OSTEOPOROSIS 04/30/2006    Past Surgical History:  Procedure Laterality Date  . APPENDECTOMY    . BREAST BIOPSY    . CATARACT EXTRACTION    . DILATION AND CURETTAGE OF UTERUS     x several  . KNEE ARTHROSCOPY  10/2007 and 10/2010   left  . REPLACEMENT TOTAL KNEE  august 2012  . ROTATOR CUFF REPAIR    . VERTEBROPLASTY     x 3    OB History    No data available       Home Medications    Prior to Admission  medications   Medication Sig Start Date End Date Taking? Authorizing Provider  acetaminophen (TYLENOL) 500 MG tablet Take 1,000 mg by mouth 2 (two) times daily as needed.    [provider]  carvedilol (COREG) 3.125 MG tablet TAKE 1 TABLET TWICE DAILY. 02/02/16   Nelwyn Salisbury, MD  ELIQUIS 2.5 MG TABS tablet TAKE 1 TABLET TWICE DAILY. 03/21/16   Nelwyn Salisbury, MD  escitalopram (LEXAPRO) 10 MG tablet Take 1 tablet (10 mg total) by mouth daily. 02/03/16   Nelwyn Salisbury, MD  ferrous sulfate 325 (65 FE) MG tablet TAKE 1 TABLET TWICE DAILY. 03/29/16   Nelwyn Salisbury, MD  furosemide (LASIX) 40 MG tablet Take 1 tablet  (40 mg total) by mouth daily. 11/24/15   Johnson, Clanford L, MD  gabapentin (NEURONTIN) 100 MG capsule TAKE 1 CAPSULE EVERY DAY. 02/22/16   Nelwyn Salisbury, MD  levothyroxine (SYNTHROID, LEVOTHROID) 75 MCG tablet Take 75 mcg by mouth daily before breakfast.    [provider]  Magnesium 400 MG CAPS Take 800 mg by mouth daily.    [provider]  pantoprazole (PROTONIX) 40 MG tablet Take 1 tablet (40 mg total) by mouth daily. 05/15/15   Azalee Course, PA  potassium chloride (KLOR-CON) 20 MEQ packet Take 20 mEq by mouth 2 (two) times daily. 02/03/16   Nelwyn Salisbury, MD  risperiDONE (RISPERDAL) 0.5 MG tablet Once daily at suppertime 08/30/16   Nelwyn Salisbury, MD  traMADol (ULTRAM) 50 MG tablet Take 2 tablets (100 mg total) by mouth every 6 (six) hours as needed for moderate pain. 08/30/16   Nelwyn Salisbury, MD    Family History Family History  Problem Relation Age of Onset  . Stroke Father 11  . Heart attack Father   . Coronary artery disease Father 58  . Alcohol abuse Mother   . Breast cancer Sister   . Heart disease Sister   . Colon cancer Neg Hx     Social History Social History  Substance Use Topics  . Smoking status: Former Smoker    Packs/day: 1.00    Years: 20.00    Types: Cigarettes    Quit date: 01/25/1988  . Smokeless tobacco: Never Used  . Alcohol use 0.0 oz/week     Comment: 2 glasses every day     Allergies   Celecoxib; Clindamycin hcl; Rofecoxib; and Sertraline hcl   Review of Systems Review of Systems  Constitutional: Positive for activity change. Negative for chills and fever.  HENT: Negative for congestion and rhinorrhea.   Eyes: Negative for redness and visual disturbance.  Respiratory: Negative for shortness of breath and wheezing.   Cardiovascular: Negative for chest pain and palpitations.  Gastrointestinal: Negative for nausea and vomiting.  Genitourinary: Negative for dysuria and urgency.  Musculoskeletal: Positive for back pain. Negative for  arthralgias and myalgias.  Skin: Negative for pallor and wound.  Neurological: Negative for dizziness and headaches.     Physical Exam Updated Vital Signs BP (!) 88/56   Pulse (!) 126   Temp (!) 103.8 F (39.9 C) (Rectal)   Resp 16   SpO2 100%   Physical Exam  Constitutional: She appears cachectic. She appears distressed.  HENT:  Head: Normocephalic and atraumatic.  Eyes: Pupils are equal, round, and reactive to light. EOM are normal.  Neck: Normal range of motion. Neck supple.  Cardiovascular: Regular rhythm.  Tachycardia present.  Exam reveals no gallop and no friction rub.   No murmur heard.  Pulmonary/Chest: Effort normal. She has no wheezes. She has no rales.  Abdominal: Soft. She exhibits no distension and no mass. There is no tenderness. There is no guarding.  Musculoskeletal: She exhibits no edema or tenderness.  No midline spinal tenderness  Neurological: She is unresponsive. GCS eye subscore is 4. GCS verbal subscore is 2. GCS motor subscore is 1.  Skin: Skin is warm and dry. She is not diaphoretic.  Nursing note and vitals reviewed.    ED Treatments / Results  Labs (all labs ordered are listed, but only abnormal results are displayed) Labs Reviewed  COMPREHENSIVE METABOLIC PANEL - Abnormal; Notable for the following:       Result Value   Glucose, Bld 119 (*)    Calcium 8.5 (*)    Total Protein 8.2 (*)    Albumin 3.1 (*)    AST 96 (*)    ALT 93 (*)    Alkaline Phosphatase 335 (*)    Total Bilirubin 4.8 (*)    All other components within normal limits  CBC WITH DIFFERENTIAL/PLATELET - Abnormal; Notable for the following:    RBC 3.75 (*)    MCV 103.7 (*)    RDW 20.0 (*)    Neutro Abs 8.3 (*)    Lymphs Abs 0.3 (*)    All other components within normal limits  URINALYSIS, ROUTINE W REFLEX MICROSCOPIC - Abnormal; Notable for the following:    Color, Urine AMBER (*)    Protein, ur 30 (*)    All other components within normal limits  I-STAT CG4 LACTIC  ACID, ED - Abnormal; Notable for the following:    Lactic Acid, Venous 3.15 (*)    All other components within normal limits  I-STAT CHEM 8, ED - Abnormal; Notable for the following:    BUN 25 (*)    Glucose, Bld 114 (*)    Calcium, Ion 1.00 (*)    All other components within normal limits  I-STAT ARTERIAL BLOOD GAS, ED - Abnormal; Notable for the following:    pCO2 arterial 49.6 (*)    pO2, Arterial 446.0 (*)    Bicarbonate 29.2 (*)    Acid-Base Excess 4.0 (*)    All other components within normal limits  CULTURE, BLOOD (ROUTINE X 2)  CULTURE, BLOOD (ROUTINE X 2)  URINE CULTURE  TSH  T4  TROPONIN I  TROPONIN I  TROPONIN I  LACTIC ACID, PLASMA  LACTIC ACID, PLASMA  PROCALCITONIN  CORTISOL  I-STAT CG4 LACTIC ACID, ED    EKG  EKG Interpretation  Date/Time:  Saturday September 03 2016 11:28:16 EDT Ventricular Rate:  156 PR Interval:    QRS Duration: 91 QT Interval:  296 QTC Calculation: 477 R Axis:   139 Text Interpretation:  Atrial fibrillation with rapid V-rate Ventricular premature complex RVH with secondary repolarization abnrm Nonspecific T abnormalities, lateral leads Since last tracing rate faster Otherwise no significant change Confirmed by Melene PlanFloyd, Angelisse Riso [phone]) on 09/03/2016 11:47:44 AM       Radiology Dg Chest Portable 1 View  Result Date: 09/03/2016 CLINICAL DATA:  Intubation, hypertension, CHF, idiopathic pulmonary fibrosis, atrial fibrillation EXAM: PORTABLE CHEST 1 VIEW COMPARISON:  Portable exam 1312 hours compared to 1138 hours FINDINGS: Tip of endotracheal tube projects 3.2 cm above carina. Stable heart size. Slightly rotated to the RIGHT which may account for prominence of the RIGHT hilum. Diffuse interstitial infiltrates compatible pulmonary fibrosis. No definite superimposed acute infiltrate, pleural effusion or pneumothorax. Bones demineralized. IMPRESSION: Pulmonary fibrosis. Endotracheal tube tip  projects 3.2 cm above carina. Electronically Signed   By:  Ulyses Southward M.D.   On: 09/03/2016 13:39   Dg Chest Port 1 View  Result Date: 09/03/2016 CLINICAL DATA:  Fever and altered mental status. EXAM: PORTABLE CHEST 1 VIEW COMPARISON:  03/07/2016 and CT 02/07/2014 FINDINGS: Patient slightly rotated to the right. Lungs are adequately inflated demonstrate diffuse predominant peripheral bilateral interstitial disease compatible with known pulmonary fibrosis. No focal confluent airspace disease. No definite effusion. Stable cardiomegaly. Moderate size hiatal hernia. Calcified plaque over the aortic arch. Remainder of the exam is unchanged. IMPRESSION: No acute cardiopulmonary disease. Evidence of known pulmonary fibrosis. Stable cardiomegaly. Aortic Atherosclerosis (ICD10-I70.0). Electronically Signed   By: Elberta Fortis M.D.   On: 09/03/2016 11:52    Procedures Procedure Name: Intubation Date/Time: 09/03/2016 1:16 PM Performed by: Adela Lank, Ellenora Talton Pre-anesthesia Checklist: Patient identified, Emergency Drugs available and Suction available Oxygen Delivery Method: Non-rebreather mask Preoxygenation: Pre-oxygenation with 100% oxygen Induction Type: Rapid sequence Ventilation: Mask ventilation without difficulty Laryngoscope Size: Glidescope Grade View: Grade I Tube size: 7.5 mm Number of attempts: 1 Placement Confirmation: ETT inserted through vocal cords under direct vision,  Positive ETCO2 and Breath sounds checked- equal and bilateral Secured at: 22 cm Tube secured with: ETT holder Dental Injury: Teeth and Oropharynx as per pre-operative assessment  Difficulty Due To: Difficulty was anticipated Future Recommendations: Recommend- induction with short-acting agent, and alternative techniques readily available       (including critical care time)  Medications Ordered in ED Medications  iopamidol (ISOVUE-300) 61 % injection (not administered)  fentaNYL (SUBLIMAZE) injection 50 mcg (not administered)  fentaNYL (SUBLIMAZE) injection 50 mcg (not  administered)  midazolam (VERSED) injection 1 mg (not administered)  midazolam (VERSED) injection 1 mg (not administered)  fentaNYL (SUBLIMAZE) injection 50 mcg (not administered)  lactated ringers infusion (300 mL/hr Intravenous New Bag/Given 09/03/16 1418)  vancomycin (VANCOCIN) 500 mg in sodium chloride 0.9 % 100 mL IVPB (500 mg Intravenous Not Given 09/03/16 1436)  cefTRIAXone (ROCEPHIN) 2 g in dextrose 5 % 50 mL IVPB (not administered)  metroNIDAZOLE (FLAGYL) IVPB 500 mg (not administered)  0.9 %  sodium chloride infusion (not administered)  pantoprazole (PROTONIX) injection 40 mg (not administered)  sodium chloride 0.9 % bolus 2,000 mL (0 mLs Intravenous Stopped 09/03/16 1331)  vancomycin (VANCOCIN) IVPB 1000 mg/200 mL premix (0 mg Intravenous Stopped 09/03/16 1330)  piperacillin-tazobactam (ZOSYN) IVPB 3.375 g (0 g Intravenous Stopped 09/03/16 1239)  acetaminophen (TYLENOL) suppository 650 mg (650 mg Rectal Given 09/03/16 1211)  etomidate (AMIDATE) injection (10 mg Intravenous Given 09/03/16 1305)  rocuronium (ZEMURON) injection (49 mg Intravenous Given 09/03/16 1305)  norepinephrine (LEVOPHED) 4 mg in dextrose 5 % 250 mL (0.016 mg/mL) infusion (4 mcg/min Intravenous Rate/Dose Change 09/03/16 1436)  sodium chloride 0.9 % bolus 1,000 mL (0 mLs Intravenous Stopped 09/03/16 1420)     Initial Impression / Assessment and Plan / ED Course  I have reviewed the triage vital signs and the nursing notes.  Pertinent labs & imaging results that were available during my care of the patient were reviewed by me and considered in my medical decision making (see chart for details).     83 yo F With a chief complaints of altered mental status. This been going on for the 2 days but acutely worsening. On arrival the patient had a fever of 103.6. Code sepsis was initiated. 30 mL/kg of IV fluids are ordered. Laboratory evaluation with a significant increase of total bilirubin and alkaline phosphatase. I  will  obtain a CT of the abdomen and pelvis if able. CODE STATUS was discussed with the family and we will initially hold off and see how she responds to IV fluids and Tylenol.  The patient had a precipitous decline. Oxygen saturations went into the 70s had only minimal improvement with a nonrebreather. I rediscussed with the family at this point feels that they would like her to be intubated.  CRITICAL CARE Performed by: Rae Roam   Total critical care time: 80 minutes  Critical care time was exclusive of separately billable procedures and treating other patients.  Critical care was necessary to treat or prevent imminent or life-threatening deterioration.  Critical care was time spent personally by me on the following activities: development of treatment plan with patient and/or surrogate as well as nursing, discussions with consultants, evaluation of patient's response to treatment, examination of patient, obtaining history from patient or surrogate, ordering and performing treatments and interventions, ordering and review of laboratory studies, ordering and review of radiographic studies, pulse oximetry and re-evaluation of patient's condition.   The patients results and plan were reviewed and discussed.   Any x-rays performed were independently reviewed by myself.   Differential diagnosis were considered with the presenting HPI.  Medications  iopamidol (ISOVUE-300) 61 % injection (not administered)  fentaNYL (SUBLIMAZE) injection 50 mcg (not administered)  fentaNYL (SUBLIMAZE) injection 50 mcg (not administered)  midazolam (VERSED) injection 1 mg (not administered)  midazolam (VERSED) injection 1 mg (not administered)  fentaNYL (SUBLIMAZE) injection 50 mcg (not administered)  lactated ringers infusion (300 mL/hr Intravenous New Bag/Given 09/03/16 1418)  vancomycin (VANCOCIN) 500 mg in sodium chloride 0.9 % 100 mL IVPB (500 mg Intravenous Not Given 09/03/16 1436)  cefTRIAXone  (ROCEPHIN) 2 g in dextrose 5 % 50 mL IVPB (not administered)  metroNIDAZOLE (FLAGYL) IVPB 500 mg (not administered)  0.9 %  sodium chloride infusion (not administered)  pantoprazole (PROTONIX) injection 40 mg (not administered)  sodium chloride 0.9 % bolus 2,000 mL (0 mLs Intravenous Stopped 09/03/16 1331)  vancomycin (VANCOCIN) IVPB 1000 mg/200 mL premix (0 mg Intravenous Stopped 09/03/16 1330)  piperacillin-tazobactam (ZOSYN) IVPB 3.375 g (0 g Intravenous Stopped 09/03/16 1239)  acetaminophen (TYLENOL) suppository 650 mg (650 mg Rectal Given 09/03/16 1211)  etomidate (AMIDATE) injection (10 mg Intravenous Given 09/03/16 1305)  rocuronium (ZEMURON) injection (49 mg Intravenous Given 09/03/16 1305)  norepinephrine (LEVOPHED) 4 mg in dextrose 5 % 250 mL (0.016 mg/mL) infusion (4 mcg/min Intravenous Rate/Dose Change 09/03/16 1436)  sodium chloride 0.9 % bolus 1,000 mL (0 mLs Intravenous Stopped 09/03/16 1420)    Vitals:   09/03/16 1351 09/03/16 1409 09/03/16 1418 09/03/16 1434  BP: (!) 70/51 (!) 87/54 92/66 (!) 88/56  Pulse: (!) 113 (!) 133 (!) 114 (!) 126  Resp: 16 16 16 16   Temp:      TempSrc:      SpO2: 100% 100% 100% 100%    Final diagnoses:  Glasgow coma scale total score 3-8, at hospital admission [facility])  Septic shock (HCC)  Acute respiratory failure with hypoxia (HCC)    Admission/ observation were discussed with the admitting physician, patient and/or family and they are comfortable with the plan.    Final Clinical Impressions(s) / ED Diagnoses   Final diagnoses:  Glasgow coma scale total score 3-8, at hospital admission [facility])  Septic shock (HCC)  Acute respiratory failure with hypoxia (HCC)    New Prescriptions New Prescriptions   No medications on file     [place],  Quantia Grullon, DO 09/03/16 1438

## 2016-09-03 NOTE — Progress Notes (Signed)
Pharmacy Antibiotic Note  [name] [name] is a 83 y.o. female admitted on 09/03/2016 with sepsis.  Pharmacy has been consulted for vancomycin dosing. Patient received loading dose of vancomycin 1g IV in ED @1145 . Patient is also currently receiving ceftriaxone and metronidazole.   Plan: Vancomycin 1 gram IV every 24 hours.  Goal trough 15-20 mcg/mL. Continue to monitor: follow troughs as indicated, LOT, clinical status, renal function and cultures  Temp (24hrs), Avg:103.8 F (39.9 C), Min:103.8 F (39.9 C), Max:103.8 F (39.9 C)   Recent Labs Lab 09/03/16 1137 09/03/16 1144 09/03/16 1204  WBC 8.8  --   --   CREATININE 0.83  --  0.70  LATICACIDVEN  --  3.15*  --     Estimated Creatinine Clearance: 40.5 mL/min (by C-G formula based on SCr of 0.7 mg/dL).    Allergies  Allergen Reactions  . Celecoxib     REACTION: unspecified  . Clindamycin Hcl Diarrhea  . Rofecoxib     REACTION: unspecified  . Sertraline Hcl     REACTION: unspecified    Antimicrobials this admission: Vancomycin 1 gram IV loading dose x 1  Vancomycin 8/11>> Ceftriaxone 8/11>> Metronidazole 8/11>>  Microbiology results: 8/11 BCx x2: pending  8/11 UCx: pending   Thank you for allowing pharmacy to be a part of this patient's care.  [name] [name], Pharm.D. PGY1 Pharmacy Resident 09/03/2016 2:53 PM Main Pharmacy: [phone]

## 2016-09-03 NOTE — Consult Note (Signed)
Reason for Consult:cholecystitis Referring Physician: Dr. Marolyn Hammock  [name] [name] is an 83 y.o. female.  HPI: This is an 83 year old female with moderate dementia as well as multiple medical problems including atrial fibrillation on anticoagulation who presented this morning with sepsis. She required intubation upon arrival to the emergency department. She was hypotensive. During her workup, a CAT scan of the abdomen and pelvis demonstrated a distended and significantly inflamed gallbladder.  Her liver function tests were all elevated including a bilirubin of 4.8.  I have been asked to discuss this with the family who are currently deciding on what further course of action to undergo given the patient's overall medical condition. She is currently undergoing resuscitative measures and remains hypotensive.  Past Medical History:  Diagnosis Date  . Anemia   . Anxiety   . Arthritis   . Atrial fibrillation ([place])   . CHF (congestive heart failure) ([place])   . Dementia   . Depression   . Diverticulosis of colon   . History of alcohol abuse   . Hyperlipidemia   . Hypertension   . IPF (idiopathic pulmonary fibrosis) ([place])   . Irritable bowel syndrome   . OSA (obstructive sleep apnea)   . Osteoporosis   . PMR (polymyalgia rheumatica) ([facility])   . Postherpetic neuralgia   . Spinal stenosis   . Thyroid disease   . Vertebral fracture   . Vitamin D deficiency     Past Surgical History:  Procedure Laterality Date  . APPENDECTOMY    . BREAST BIOPSY    . CATARACT EXTRACTION    . DILATION AND CURETTAGE OF UTERUS     x several  . KNEE ARTHROSCOPY  10/2007 and 10/2010   left  . REPLACEMENT TOTAL KNEE  august 2012  . ROTATOR CUFF REPAIR    . VERTEBROPLASTY     x 3    Family History  Problem Relation Age of Onset  . Stroke Father 82  . Heart attack Father   . Coronary artery disease Father 48  . Alcohol abuse Mother   . Breast cancer Sister   . Heart disease Sister   . Colon cancer Neg  Hx     Social History:  reports that she quit smoking about 28 years ago. Her smoking use included Cigarettes. She has a 20.00 pack-year smoking history. She has never used smokeless tobacco. She reports that she drinks alcohol. She reports that she does not use drugs.  Allergies:  Allergies  Allergen Reactions  . Clindamycin Hcl Diarrhea  . Rofecoxib Other (See Comments)    "didn't agree with her" per daughter  . Celecoxib Other (See Comments)    "didn't agree with her" per daughter  . Sertraline Hcl Other (See Comments)    "didn't agree with her" per daughter    Medications: I have reviewed the patient's current medications.  Results for orders placed or performed during the hospital encounter of 09/03/16 (from the past 48 hour(s))  Comprehensive metabolic panel     Status: Abnormal   Collection Time: 09/03/16 11:37 AM  Result Value Ref Range   Sodium 139 135 - 145 mmol/L   Potassium 4.0 3.5 - 5.1 mmol/L   Chloride 103 101 - 111 mmol/L   CO2 27 22 - 32 mmol/L   Glucose, Bld 119 (H) 65 - 99 mg/dL   BUN 19 6 - 20 mg/dL   Creatinine, Ser 0.83 0.44 - 1.00 mg/dL   Calcium 8.5 (L) 8.9 - 10.3 mg/dL  Total Protein 8.2 (H) 6.5 - 8.1 g/dL   Albumin 3.1 (L) 3.5 - 5.0 g/dL   AST 96 (H) 15 - 41 U/L   ALT 93 (H) 14 - 54 U/L   Alkaline Phosphatase 335 (H) 38 - 126 U/L   Total Bilirubin 4.8 (H) 0.3 - 1.2 mg/dL   GFR calc non Af Amer >60 >60 mL/min   GFR calc Af Amer >60 >60 mL/min    Comment: (NOTE) The eGFR has been calculated using the CKD EPI equation. This calculation has not been validated in all clinical situations. eGFR's persistently <60 mL/min signify possible Chronic Kidney Disease.    Anion gap 9 5 - 15  CBC WITH DIFFERENTIAL     Status: Abnormal   Collection Time: 09/03/16 11:37 AM  Result Value Ref Range   WBC 8.8 4.0 - 10.5 K/uL   RBC 3.75 (L) 3.87 - 5.11 MIL/uL   Hemoglobin 12.5 12.0 - 15.0 g/dL   HCT 38.9 36.0 - 46.0 %   MCV 103.7 (H) 78.0 - 100.0 fL   MCH  33.3 26.0 - 34.0 pg   MCHC 32.1 30.0 - 36.0 g/dL   RDW 20.0 (H) 11.5 - 15.5 %   Platelets 206 150 - 400 K/uL   Neutrophils Relative % 94 %   Neutro Abs 8.3 (H) 1.7 - 7.7 K/uL   Lymphocytes Relative 4 %   Lymphs Abs 0.3 (L) 0.7 - 4.0 K/uL   Monocytes Relative 2 %   Monocytes Absolute 0.2 0.1 - 1.0 K/uL   Eosinophils Relative 0 %   Eosinophils Absolute 0.0 0.0 - 0.7 K/uL   Basophils Relative 0 %   Basophils Absolute 0.0 0.0 - 0.1 K/uL  I-Stat CG4 Lactic Acid, ED  (not at  [facility])     Status: Abnormal   Collection Time: 09/03/16 11:44 AM  Result Value Ref Range   Lactic Acid, Venous 3.15 (HH) 0.5 - 1.9 mmol/L   Comment NOTIFIED PHYSICIAN   I-Stat Chem 8, ED     Status: Abnormal   Collection Time: 09/03/16 12:04 PM  Result Value Ref Range   Sodium 141 135 - 145 mmol/L   Potassium 4.1 3.5 - 5.1 mmol/L   Chloride 101 101 - 111 mmol/L   BUN 25 (H) 6 - 20 mg/dL   Creatinine, Ser 0.70 0.44 - 1.00 mg/dL   Glucose, Bld 114 (H) 65 - 99 mg/dL   Calcium, Ion 1.00 (L) 1.15 - 1.40 mmol/L   TCO2 28 0 - 100 mmol/L   Hemoglobin 14.3 12.0 - 15.0 g/dL   HCT 42.0 36.0 - 46.0 %  Urinalysis, Routine w reflex microscopic (not at [facility])     Status: Abnormal   Collection Time: 09/03/16 12:27 PM  Result Value Ref Range   Color, Urine AMBER (A) YELLOW    Comment: BIOCHEMICALS MAY BE AFFECTED BY COLOR   APPearance CLEAR CLEAR   Specific Gravity, Urine 1.018 1.005 - 1.030   pH 6.0 5.0 - 8.0   Glucose, UA NEGATIVE NEGATIVE mg/dL   Hgb urine dipstick NEGATIVE NEGATIVE   Bilirubin Urine NEGATIVE NEGATIVE   Ketones, ur NEGATIVE NEGATIVE mg/dL   Protein, ur 30 (A) NEGATIVE mg/dL   Nitrite NEGATIVE NEGATIVE   Leukocytes, UA NEGATIVE NEGATIVE   RBC / HPF 0-5 0 - 5 RBC/hpf   WBC, UA NONE SEEN 0 - 5 WBC/hpf   Bacteria, UA NONE SEEN NONE SEEN   Squamous Epithelial / LPF NONE SEEN NONE SEEN   Hyaline  Casts, UA PRESENT   I-Stat arterial blood gas, ED     Status: Abnormal   Collection Time: 09/03/16  2:31 PM   Result Value Ref Range   pH, Arterial 7.380 7.350 - 7.450   pCO2 arterial 49.6 (H) 32.0 - 48.0 mmHg   pO2, Arterial 446.0 (H) 83.0 - 108.0 mmHg   Bicarbonate 29.2 (H) 20.0 - 28.0 mmol/L   TCO2 31 0 - 100 mmol/L   O2 Saturation 100.0 %   Acid-Base Excess 4.0 (H) 0.0 - 2.0 mmol/L   Patient temperature 100.0 F    Collection site RADIAL, ALLEN'S TEST ACCEPTABLE    Drawn by Operator    Sample type ARTERIAL   I-Stat CG4 Lactic Acid, ED  (not at  [facility])     Status: Abnormal   Collection Time: 09/03/16  3:02 PM  Result Value Ref Range   Lactic Acid, Venous 2.68 (HH) 0.5 - 1.9 mmol/L   Comment NOTIFIED PHYSICIAN   TSH     Status: None   Collection Time: 09/03/16  3:35 PM  Result Value Ref Range   TSH 0.751 0.350 - 4.500 uIU/mL    Comment: Performed by a 3rd Generation assay with a functional sensitivity of <=0.01 uIU/mL.  Troponin I     Status: Abnormal   Collection Time: 09/03/16  3:35 PM  Result Value Ref Range   Troponin I 0.06 (HH) <0.03 ng/mL    Comment: CRITICAL RESULT CALLED TO, READ BACK BY AND VERIFIED WITH: RN S TUTTLE AT 1718 [id number] MARTINB   Lactic acid, plasma     Status: Abnormal   Collection Time: 09/03/16  3:35 PM  Result Value Ref Range   Lactic Acid, Venous 2.2 (HH) 0.5 - 1.9 mmol/L    Comment: CRITICAL RESULT CALLED TO, READ BACK BY AND VERIFIED WITH: RN S TUTTLE AT 1713 [phone] MARTINB   Procalcitonin     Status: None   Collection Time: 09/03/16  3:35 PM  Result Value Ref Range   Procalcitonin 5.97 ng/mL    Comment:        Interpretation: PCT > 2 ng/mL: Systemic infection (sepsis) is likely, unless other causes are known. (NOTE)         ICU PCT Algorithm               Non ICU PCT Algorithm    ----------------------------     ------------------------------         PCT < 0.25 ng/mL                 PCT < 0.1 ng/mL     Stopping of antibiotics            Stopping of antibiotics       strongly encouraged.               strongly encouraged.     ----------------------------     ------------------------------       PCT level decrease by               PCT < 0.25 ng/mL       >= 80% from peak PCT       OR PCT 0.25 - 0.5 ng/mL          Stopping of antibiotics  encouraged.     Stopping of antibiotics           encouraged.    ----------------------------     ------------------------------       PCT level decrease by              PCT >= 0.25 ng/mL       < 80% from peak PCT        AND PCT >= 0.5 ng/mL            Continuing antibiotics                                               encouraged.       Continuing antibiotics            encouraged.    ----------------------------     ------------------------------     PCT level increase compared          PCT > 0.5 ng/mL         with peak PCT AND          PCT >= 0.5 ng/mL             Escalation of antibiotics                                          strongly encouraged.      Escalation of antibiotics        strongly encouraged.     Ct Head Wo Contrast  Result Date: 09/03/2016 CLINICAL DATA:  Found down at nursing home, respiratory distress, altered level of consciousness, history hypertension, dementia, CHF, pulmonary fibrosis EXAM: CT HEAD WITHOUT CONTRAST TECHNIQUE: Contiguous axial images were obtained from the base of the skull through the vertex without intravenous contrast. Sagittal and coronal MPR images reconstructed from axial data set. COMPARISON:  03/07/2016 FINDINGS: Brain: Generalized atrophy. Normal ventricular morphology. No midline shift or mass effect. Minimal small vessel chronic ischemic changes of deep cerebral white matter. No intracranial hemorrhage, mass lesion, evidence of acute infarction, or extra-axial fluid collection. Vascular: Atherosclerotic calcification of internal carotid arteries at skullbase Skull: Intact Sinuses/Orbits: Clear.  Dependent fluid in the nasopharynx Other: N/A IMPRESSION: Prominent atrophy with minimal  small vessel chronic ischemic changes of deep cerebral white matter. No acute intracranial abnormalities. Electronically Signed   By: Lavonia Dana M.D.   On: 09/03/2016 16:45   Ct Abdomen Pelvis W Contrast  Result Date: 09/03/2016 CLINICAL DATA:  Abdominal pain.  Respiratory distress. EXAM: CT ABDOMEN AND PELVIS WITH CONTRAST TECHNIQUE: Multidetector CT imaging of the abdomen and pelvis was performed using the standard protocol following bolus administration of intravenous contrast. CONTRAST:  73m ISOVUE-300 IOPAMIDOL (ISOVUE-300) INJECTION 61% COMPARISON:  Chest CT dated 02/07/2014. Right upper quadrant abdomen ultrasound dated 11/20/2015. FINDINGS: Lower chest: Again demonstrated are fibrotic changes with peripheral honeycombing at both lung bases. There is interval increased density in the posterior aspects of both lower lobes, greater on the right. No pleural fluid seen. Moderately large hiatal hernia. Enlarged heart. Small pericardial effusion with a maximum thickness of 11 mm. Extensive atheromatous coronary artery calcifications. Hepatobiliary: Interval diffuse low density of the liver relative to the spleen. There is a linear branching area of increased enhancement in the right lobe of the  liver on the initial images, not seen on the delayed images. There is extensive pericholecystic fluid and soft tissue stranding. Also demonstrated is marked dilatation of the cystic duct with mucosal enhancement and dilatation of the gallbladder with mucosal enhancement. No calcified gallstones visualized. No gallstones visualized on the previous ultrasound. Pancreas: Pancreatic atrophy. Spleen: Normal in size without focal abnormality. Adrenals/Urinary Tract: Adrenal glands are unremarkable. Kidneys are normal, without renal calculi, focal lesion, or hydronephrosis. Bladder is unremarkable. Stomach/Bowel: Moderately large hiatal hernia. No small bowel or colonic abnormalities demonstrated. No evidence of appendicitis,  surgically absent by history. Vascular/Lymphatic: Arterial calcifications, including dense coronary artery calcifications. No aneurysm or enlarged lymph nodes. Reproductive: Small uterus.  No adnexal masses. Other: Small amount of free peritoneal fluid. No abdominal wall hernia seen. Musculoskeletal: Multiple lumbar and lower thoracic vertebral compression deformities, some with kyphoplasty material. No acute fracture lines seen. Mild bilateral hip degenerative changes. IMPRESSION: 1. Findings compatible with cystic duct obstruction with marked changes of acute cholecystitis. A right upper quadrant abdomen ultrasound may provide useful additional information. 2. Small amount of free peritoneal fluid. 3. Diffuse hepatic steatosis and transient enhancement phenomenon in the right lobe. 4. Interval atelectasis or pneumonia in both lower lobes, greater on the right with stable underlying interstitial fibrosis and peripheral honeycombing. 5. Extensive dense calcified coronary artery atherosclerosis. 6. Moderately large hiatal hernia. Electronically Signed   By: Claudie Revering M.D.   On: 09/03/2016 17:10   Dg Chest Portable 1 View  Result Date: 09/03/2016 CLINICAL DATA:  Intubation, hypertension, CHF, idiopathic pulmonary fibrosis, atrial fibrillation EXAM: PORTABLE CHEST 1 VIEW COMPARISON:  Portable exam 1312 hours compared to 1138 hours FINDINGS: Tip of endotracheal tube projects 3.2 cm above carina. Stable heart size. Slightly rotated to the RIGHT which may account for prominence of the RIGHT hilum. Diffuse interstitial infiltrates compatible pulmonary fibrosis. No definite superimposed acute infiltrate, pleural effusion or pneumothorax. Bones demineralized. IMPRESSION: Pulmonary fibrosis. Endotracheal tube tip projects 3.2 cm above carina. Electronically Signed   By: Lavonia Dana M.D.   On: 09/03/2016 13:39   Dg Chest Port 1 View  Result Date: 09/03/2016 CLINICAL DATA:  Fever and altered mental status. EXAM:  PORTABLE CHEST 1 VIEW COMPARISON:  03/07/2016 and CT 02/07/2014 FINDINGS: Patient slightly rotated to the right. Lungs are adequately inflated demonstrate diffuse predominant peripheral bilateral interstitial disease compatible with known pulmonary fibrosis. No focal confluent airspace disease. No definite effusion. Stable cardiomegaly. Moderate size hiatal hernia. Calcified plaque over the aortic arch. Remainder of the exam is unchanged. IMPRESSION: No acute cardiopulmonary disease. Evidence of known pulmonary fibrosis. Stable cardiomegaly. Aortic Atherosclerosis (ICD10-I70.0). Electronically Signed   By: Marin Olp M.D.   On: 09/03/2016 11:52    Review of Systems  Unable to perform ROS: Intubated   Blood pressure (!) 90/54, pulse (!) 117, temperature (!) 103.8 F (39.9 C), temperature source Rectal, resp. rate 17, SpO2 100 %. Physical Exam  Constitutional: She appears well-developed and well-nourished.  Intubated  HENT:  Head: Normocephalic and atraumatic.  Right Ear: External ear normal.  Left Ear: External ear normal.  Nose: Nose normal.  Eyes: Pupils are equal, round, and reactive to light. No scleral icterus.  Neck: Tracheal deviation present.  Cardiovascular: Normal heart sounds.   Tachycardic with irregular rhythm  Respiratory: No respiratory distress.  Intubated  GI: Soft.  There is no distention. There is minimal tenderness in the right upper quadrant  Neurological:  She will open her eyes to deep palpation of her abdomen  Skin: No rash noted. No erythema.    Assessment/Plan: Acute acalculous cholecystitis with sepsis  I had a long discussion with the patient's family regarding Ms. Cronce. She is not an operative candidate for cholecystectomy. We discussed doing an ultrasound and asking interventional radiology to see for a percutaneous cholecystostomy. 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  Given her anticoagulation, she cannot have  her procedure done today. I believe we need to check her coags and consult interventional radiologist for their opinion. I discussed the potential for multisystem organ failure with the family and that even with a percutaneous drain that her prognosis is poor and her overall sepsis may not be reversible..  We will continue supportive care and IV anabiotic's for now as the family makes her decisions.  We'll continue to follow her closely with you.  Janai Brannigan A 09/03/2016, 6:06 PM

## 2016-09-03 NOTE — ED Notes (Signed)
Attempted OG and NG placement, unable to place.

## 2016-09-03 NOTE — Progress Notes (Signed)
eLink Physician-Brief Progress Note Patient Name: [name] MirzaGloria G [name] DOB: 01/31/1930 MRN: [id number]   Date of Service  09/03/2016  HPI/Events of Note  Nurse concerned that LR is running at 300 mL/hour. Patient has Hx of CHF - last LVEF in 2017 was 25% to 30%.  eICU Interventions  Will order: 1. Decrease LR to 75 mL/hour. 2. Monitor CVP.      Intervention Category Major Interventions: Other:  Lenell AntuSommer,Yee Gangi Eugene 09/03/2016, 7:55 PM

## 2016-09-04 ENCOUNTER — Inpatient Hospital Stay (HOSPITAL_COMMUNITY): Payer: Medicare Other

## 2016-09-04 DIAGNOSIS — A4101 Sepsis due to Methicillin susceptible Staphylococcus aureus: Secondary | ICD-10-CM

## 2016-09-04 DIAGNOSIS — K819 Cholecystitis, unspecified: Secondary | ICD-10-CM

## 2016-09-04 DIAGNOSIS — R40243 Glasgow coma scale score 3-8, unspecified time: Secondary | ICD-10-CM

## 2016-09-04 DIAGNOSIS — F0391 Unspecified dementia with behavioral disturbance: Secondary | ICD-10-CM

## 2016-09-04 DIAGNOSIS — K8309 Other cholangitis: Secondary | ICD-10-CM

## 2016-09-04 DIAGNOSIS — R6521 Severe sepsis with septic shock: Secondary | ICD-10-CM

## 2016-09-04 DIAGNOSIS — J9601 Acute respiratory failure with hypoxia: Secondary | ICD-10-CM

## 2016-09-04 DIAGNOSIS — R7881 Bacteremia: Secondary | ICD-10-CM

## 2016-09-04 DIAGNOSIS — A419 Sepsis, unspecified organism: Secondary | ICD-10-CM

## 2016-09-04 DIAGNOSIS — T80219A Unspecified infection due to central venous catheter, initial encounter: Secondary | ICD-10-CM

## 2016-09-04 DIAGNOSIS — B9620 Unspecified Escherichia coli [E. coli] as the cause of diseases classified elsewhere: Secondary | ICD-10-CM

## 2016-09-04 LAB — COMPREHENSIVE METABOLIC PANEL
ALT: 146 U/L — ABNORMAL HIGH (ref 14–54)
AST: 211 U/L — ABNORMAL HIGH (ref 15–41)
Albumin: 2 g/dL — ABNORMAL LOW (ref 3.5–5.0)
Alkaline Phosphatase: 348 U/L — ABNORMAL HIGH (ref 38–126)
Anion gap: 12 (ref 5–15)
BUN: 15 mg/dL (ref 6–20)
CHLORIDE: 104 mmol/L (ref 101–111)
CO2: 23 mmol/L (ref 22–32)
CREATININE: 0.81 mg/dL (ref 0.44–1.00)
Calcium: 7.7 mg/dL — ABNORMAL LOW (ref 8.9–10.3)
GFR calc Af Amer: >60 mL/min (ref >60)
Glucose, Bld: 111 mg/dL — ABNORMAL HIGH (ref 65–99)
Potassium: 3.7 mmol/L (ref 3.5–5.1)
Sodium: 139 mmol/L (ref 135–145)
Total Bilirubin: 5.4 mg/dL — ABNORMAL HIGH (ref 0.3–1.2)
Total Protein: 6.2 g/dL — ABNORMAL LOW (ref 6.5–8.1)

## 2016-09-04 LAB — CBC WITH DIFFERENTIAL/PLATELET
Basophils Absolute: 0 10*3/uL (ref 0.0–0.1)
Basophils Relative: 0 %
EOS ABS: 0 10*3/uL (ref 0.0–0.7)
EOS PCT: 0 %
HCT: 30.7 % — ABNORMAL LOW (ref 36.0–46.0)
Hemoglobin: 10 g/dL — ABNORMAL LOW (ref 12.0–15.0)
LYMPHS ABS: 1.5 10*3/uL (ref 0.7–4.0)
Lymphocytes Relative: 7 %
MCH: 33.4 pg (ref 26.0–34.0)
MCHC: 32.6 g/dL (ref 30.0–36.0)
MCV: 102.7 fL — ABNORMAL HIGH (ref 78.0–100.0)
MONOS PCT: 5 %
Monocytes Absolute: 1 10*3/uL (ref 0.1–1.0)
Neutro Abs: 19.1 10*3/uL — ABNORMAL HIGH (ref 1.7–7.7)
Neutrophils Relative %: 89 %
PLATELETS: 193 10*3/uL (ref 150–400)
RBC: 2.99 MIL/uL — ABNORMAL LOW (ref 3.87–5.11)
RDW: 20.6 % — AB (ref 11.5–15.5)
WBC: 21.6 10*3/uL — AB (ref 4.0–10.5)

## 2016-09-04 LAB — BLOOD CULTURE ID PANEL (REFLEXED)
ACINETOBACTER BAUMANNII: NOT DETECTED
CANDIDA GLABRATA: NOT DETECTED
CARBAPENEM RESISTANCE: NOT DETECTED
Candida albicans: NOT DETECTED
Candida krusei: NOT DETECTED
Candida parapsilosis: NOT DETECTED
Candida tropicalis: NOT DETECTED
ENTEROBACTERIACEAE SPECIES: DETECTED — AB
ESCHERICHIA COLI: DETECTED — AB
Enterobacter cloacae complex: NOT DETECTED
Enterococcus species: NOT DETECTED
HAEMOPHILUS INFLUENZAE: NOT DETECTED
Klebsiella oxytoca: NOT DETECTED
Klebsiella pneumoniae: DETECTED — AB
LISTERIA MONOCYTOGENES: NOT DETECTED
METHICILLIN RESISTANCE: NOT DETECTED
NEISSERIA MENINGITIDIS: NOT DETECTED
PSEUDOMONAS AERUGINOSA: NOT DETECTED
Proteus species: NOT DETECTED
SERRATIA MARCESCENS: NOT DETECTED
STAPHYLOCOCCUS SPECIES: DETECTED — AB
STREPTOCOCCUS AGALACTIAE: NOT DETECTED
STREPTOCOCCUS PYOGENES: NOT DETECTED
STREPTOCOCCUS SPECIES: NOT DETECTED
Staphylococcus aureus (BCID): DETECTED — AB
Streptococcus pneumoniae: NOT DETECTED
Vancomycin resistance: NOT DETECTED

## 2016-09-04 LAB — GLUCOSE, CAPILLARY
GLUCOSE-CAPILLARY: 66 mg/dL (ref 65–99)
Glucose-Capillary: 72 mg/dL (ref 65–99)
Glucose-Capillary: 109 mg/dL — ABNORMAL HIGH (ref 65–99)
Glucose-Capillary: 89 mg/dL (ref 65–99)
Glucose-Capillary: 77 mg/dL (ref 65–99)

## 2016-09-04 LAB — BLOOD GAS, ARTERIAL
Acid-Base Excess: 1.7 mmol/L (ref 0.0–2.0)
BICARBONATE: 26.2 mmol/L (ref 20.0–28.0)
Drawn by: 51155
FIO2: 40
LHR: 16 {breaths}/min
MECHVT: 500 mL
O2 Saturation: 98.8 %
PEEP: 5 cmH2O
PO2 ART: 163 mmHg — AB (ref 83.0–108.0)
Patient temperature: 98.7
pCO2 arterial: 45.1 mmHg (ref 32.0–48.0)
pH, Arterial: 7.383 (ref 7.350–7.450)

## 2016-09-04 LAB — T4: T4, Total: 6 ug/dL (ref 4.5–12.0)

## 2016-09-04 LAB — URINE CULTURE: CULTURE: NO GROWTH

## 2016-09-04 LAB — PROTIME-INR
INR: 2.67
Prothrombin Time: 29 seconds — ABNORMAL HIGH (ref 11.4–15.2)

## 2016-09-04 LAB — TROPONIN I: TROPONIN I: 0.04 ng/mL — AB (ref <0.03)

## 2016-09-04 LAB — VITAMIN B12: VITAMIN B 12: 841 pg/mL (ref 180–914)

## 2016-09-04 LAB — ECHOCARDIOGRAM COMPLETE: WEIGHTICAEL: 1957.68 [oz_av]

## 2016-09-04 LAB — MAGNESIUM: Magnesium: 1.7 mg/dL (ref 1.7–2.4)

## 2016-09-04 LAB — APTT: APTT: 41 s — AB (ref 24–36)

## 2016-09-04 LAB — PROCALCITONIN: PROCALCITONIN: 6.76 ng/mL

## 2016-09-04 MED ORDER — SODIUM CHLORIDE 0.9 % IV SOLN
0.0000 ug/min | INTRAVENOUS | Status: DC
  Administered 2016-09-04: 10 ug/min via INTRAVENOUS
  Administered 2016-09-04: 50 ug/min via INTRAVENOUS
  Administered 2016-09-04: 30 ug/min via INTRAVENOUS
  Administered 2016-09-05: 25 ug/min via INTRAVENOUS
  Filled 2016-09-04 (×4): qty 1

## 2016-09-04 MED ORDER — MAGNESIUM SULFATE 2 GM/50ML IV SOLN
2.0000 g | Freq: Once | INTRAVENOUS | Status: AC
  Administered 2016-09-04: 2 g via INTRAVENOUS
  Filled 2016-09-04: qty 50

## 2016-09-04 MED ORDER — DEXTROSE 10 % IV SOLN
INTRAVENOUS | Status: DC
  Administered 2016-09-04: 12:00:00 via INTRAVENOUS

## 2016-09-04 MED ORDER — LACTATED RINGERS IV SOLN: INTRAVENOUS | Status: AC

## 2016-09-04 MED ORDER — DEXTROSE 50 % IV SOLN
25.0000 mL | Freq: Once | INTRAVENOUS | Status: AC
  Administered 2016-09-04: 25 mL via INTRAVENOUS

## 2016-09-04 MED ORDER — DEXTROSE 50 % IV SOLN
INTRAVENOUS | Status: AC
  Administered 2016-09-04: 25 mL via INTRAVENOUS
  Filled 2016-09-04: qty 50

## 2016-09-04 NOTE — Progress Notes (Signed)
PHARMACY - PHYSICIAN COMMUNICATION CRITICAL VALUE ALERT - BLOOD CULTURE IDENTIFICATION (BCID)  Results for orders placed or performed during the hospital encounter of 09/03/16  Blood Culture ID Panel (Reflexed) (Collected: 09/03/2016 11:36 AM)  Result Value Ref Range   Enterococcus species NOT DETECTED NOT DETECTED   Vancomycin resistance NOT DETECTED NOT DETECTED   Listeria monocytogenes NOT DETECTED NOT DETECTED   Staphylococcus species DETECTED (A) NOT DETECTED   Staphylococcus aureus DETECTED (A) NOT DETECTED   Methicillin resistance NOT DETECTED NOT DETECTED   Streptococcus species NOT DETECTED NOT DETECTED   Streptococcus agalactiae NOT DETECTED NOT DETECTED   Streptococcus pneumoniae NOT DETECTED NOT DETECTED   Streptococcus pyogenes NOT DETECTED NOT DETECTED   Acinetobacter baumannii NOT DETECTED NOT DETECTED   Enterobacteriaceae species DETECTED (A) NOT DETECTED   Enterobacter cloacae complex NOT DETECTED NOT DETECTED   Escherichia coli DETECTED (A) NOT DETECTED   Klebsiella oxytoca NOT DETECTED NOT DETECTED   Klebsiella pneumoniae DETECTED (A) NOT DETECTED   Proteus species NOT DETECTED NOT DETECTED   Serratia marcescens NOT DETECTED NOT DETECTED   Carbapenem resistance NOT DETECTED NOT DETECTED   Haemophilus influenzae NOT DETECTED NOT DETECTED   Neisseria meningitidis NOT DETECTED NOT DETECTED   Pseudomonas aeruginosa NOT DETECTED NOT DETECTED   Candida albicans NOT DETECTED NOT DETECTED   Candida glabrata NOT DETECTED NOT DETECTED   Candida krusei NOT DETECTED NOT DETECTED   Candida parapsilosis NOT DETECTED NOT DETECTED   Candida tropicalis NOT DETECTED NOT DETECTED    Name of physician (or Provider) Contacted: Dr. Molli KnockYacoub   Changes to prescribed antibiotics required: Micro lab reports only GNR on gram stain, however, the above resulted on BCID in 2 sets of blood cultures. Patient on vancomycin and zosyn. No change in antibiotics at this time. Will follow.    York CeriseKatherine Cook, PharmD Clinical Pharmacist 09/04/16 6:41 AM

## 2016-09-04 NOTE — Progress Notes (Signed)
Pt self extubated.  RT bagged Pt until RN got a non-rebreather mask.  Pt on nonrebreathing at 15L.  MD was at bedside.  He is aware she self extubated.  No new orders received at this time.

## 2016-09-04 NOTE — Progress Notes (Signed)
PULMONARY / CRITICAL CARE MEDICINE   Name: [name] [name] MRN: [id number] DOB: 05/22/1930    ADMISSION DATE:  09/03/2016  CHIEF COMPLAINT:  Fever and Altered Mental status  HISTORY OF PRESENT ILLNESS:  83 y.o. female who at baseline suffers from moderate dementia manifested as memory loss, frequent falls and indifference to food. She was brought to the due to decreased mentation and was found to have a rectal temperature greater than 103. She has reportedly has not had cough dyspnea or abdominal pain. She has been incontinent of urine, and has been having back pain since a recent fall. In the ER she was felt to be apneic and was intubated. She has become hypotensive and received 3 liters of crystalloid, she remains hypotensive and low dose levophed has been added via peripheral IV.  SUBJECTIVE:  Patient self extubated this morning. Denies any abdominal pain or nausea. Denies any chest pain or pressure. Denies any dyspnea. Patient is somewhat confused with altered mentation today.  REVIEW OF SYSTEMS:  Unable to obtain with altered mentation.  VITAL SIGNS: BP (!) 86/66   Pulse (!) 133   Temp (!) 97.4 F (36.3 C)   Resp (!) 23   Wt 122 lb 5.7 oz (55.5 kg)   SpO2 99%   BMI 19.16 kg/m   HEMODYNAMICS: CVP:  [5 mmHg-11 mmHg] 5 mmHg  VENTILATOR SETTINGS: Vent Mode: PRVC FiO2 (%):  [40 %-100 %] 40 % Set Rate:  [16 bmp] 16 bmp Vt Set:  [500 mL] 500 mL PEEP:  [5 cmH20] 5 cmH20 Plateau Pressure:  [23 cmH20-26 cmH20] 25 cmH20  INTAKE / OUTPUT: I/O last 3 completed shifts: In: 5815.3 [I.V.:2465.3; IV Piggyback:3350] Out: 535 [Urine:535]  PHYSICAL EXAMINATION: General:  Awake. Alert. No distress. No family at bedside.  Integument:  Warm & dry. No rash on exposed skin.  Extremities:  No cyanosis or clubbing.  HEENT:  Tacky mucus membranes. No scleral injection or icterus.  Cardiovascular:  Irregular rhythm with tachycardia. Atrial fibrillation. No edema. No appreciable JVD.  Pulmonary:   Good aeration & clear to auscultation bilaterally. Normal work of breathing on nasal cannula. Abdomen: Soft. Normal bowel sounds. Nondistended. Nontender. Neurological: Cranial nerves grossly intact. No meningismus. Oriented to person and place but not year or president.  LABS:  BMET  Recent Labs Lab 09/03/16 1137 09/03/16 1204 09/04/16 0303  NA 139 141 139  K 4.0 4.1 3.7  CL 103 101 104  CO2 27  --  23  BUN 19 25* 15  CREATININE 0.83 0.70 0.81  GLUCOSE 119* 114* 111*    Electrolytes  Recent Labs Lab 09/03/16 1137 09/04/16 0303  CALCIUM 8.5* 7.7*    CBC  Recent Labs Lab 09/03/16 1137 09/03/16 1204 09/04/16 0303  WBC 8.8  --  21.6*  HGB 12.5 14.3 10.0*  HCT 38.9 42.0 30.7*  PLT 206  --  193    Coag's No results for input(s): APTT, INR in the last 168 hours.  Sepsis Markers  Recent Labs Lab 09/03/16 1502 09/03/16 1535 09/03/16 1936  LATICACIDVEN 2.68* 2.2* 1.9  PROCALCITON  --  5.97  --     ABG  Recent Labs Lab 09/03/16 1431 09/04/16 0408  PHART 7.380 7.383  PCO2ART 49.6* 45.1  PO2ART 446.0* 163*    Liver Enzymes  Recent Labs Lab 09/03/16 1137 09/04/16 0303  AST 96* 211*  ALT 93* 146*  ALKPHOS 335* 348*  BILITOT 4.8* 5.4*  ALBUMIN 3.1* 2.0*    Cardiac Enzymes  Recent Labs Lab 09/03/16 1535 09/03/16 1936 09/04/16 0303  TROPONINI 0.06* 0.05* 0.04*    Glucose  Recent Labs Lab 09/03/16 1959 09/04/16 0845  GLUCAP 121* 72    Imaging Ct Head Wo Contrast  Result Date: 09/03/2016 CLINICAL DATA:  Found down at nursing home, respiratory distress, altered level of consciousness, history hypertension, dementia, CHF, pulmonary fibrosis EXAM: CT HEAD WITHOUT CONTRAST TECHNIQUE: Contiguous axial images were obtained from the base of the skull through the vertex without intravenous contrast. Sagittal and coronal MPR images reconstructed from axial data set. COMPARISON:  03/07/2016 FINDINGS: Brain: Generalized atrophy. Normal  ventricular morphology. No midline shift or mass effect. Minimal small vessel chronic ischemic changes of deep cerebral white matter. No intracranial hemorrhage, mass lesion, evidence of acute infarction, or extra-axial fluid collection. Vascular: Atherosclerotic calcification of internal carotid arteries at skullbase Skull: Intact Sinuses/Orbits: Clear.  Dependent fluid in the nasopharynx Other: N/A IMPRESSION: Prominent atrophy with minimal small vessel chronic ischemic changes of deep cerebral white matter. No acute intracranial abnormalities. Electronically Signed   By: Ulyses Southward M.D.   On: 09/03/2016 16:45   Ct Abdomen Pelvis W Contrast  Result Date: 09/03/2016 CLINICAL DATA:  Abdominal pain.  Respiratory distress. EXAM: CT ABDOMEN AND PELVIS WITH CONTRAST TECHNIQUE: Multidetector CT imaging of the abdomen and pelvis was performed using the standard protocol following bolus administration of intravenous contrast. CONTRAST:  80mL ISOVUE-300 IOPAMIDOL (ISOVUE-300) INJECTION 61% COMPARISON:  Chest CT dated 02/07/2014. Right upper quadrant abdomen ultrasound dated 11/20/2015. FINDINGS: Lower chest: Again demonstrated are fibrotic changes with peripheral honeycombing at both lung bases. There is interval increased density in the posterior aspects of both lower lobes, greater on the right. No pleural fluid seen. Moderately large hiatal hernia. Enlarged heart. Small pericardial effusion with a maximum thickness of 11 mm. Extensive atheromatous coronary artery calcifications. Hepatobiliary: Interval diffuse low density of the liver relative to the spleen. There is a linear branching area of increased enhancement in the right lobe of the liver on the initial images, not seen on the delayed images. There is extensive pericholecystic fluid and soft tissue stranding. Also demonstrated is marked dilatation of the cystic duct with mucosal enhancement and dilatation of the gallbladder with mucosal enhancement. No  calcified gallstones visualized. No gallstones visualized on the previous ultrasound. Pancreas: Pancreatic atrophy. Spleen: Normal in size without focal abnormality. Adrenals/Urinary Tract: Adrenal glands are unremarkable. Kidneys are normal, without renal calculi, focal lesion, or hydronephrosis. Bladder is unremarkable. Stomach/Bowel: Moderately large hiatal hernia. No small bowel or colonic abnormalities demonstrated. No evidence of appendicitis, surgically absent by history. Vascular/Lymphatic: Arterial calcifications, including dense coronary artery calcifications. No aneurysm or enlarged lymph nodes. Reproductive: Small uterus.  No adnexal masses. Other: Small amount of free peritoneal fluid. No abdominal wall hernia seen. Musculoskeletal: Multiple lumbar and lower thoracic vertebral compression deformities, some with kyphoplasty material. No acute fracture lines seen. Mild bilateral hip degenerative changes. IMPRESSION: 1. Findings compatible with cystic duct obstruction with marked changes of acute cholecystitis. A right upper quadrant abdomen ultrasound may provide useful additional information. 2. Small amount of free peritoneal fluid. 3. Diffuse hepatic steatosis and transient enhancement phenomenon in the right lobe. 4. Interval atelectasis or pneumonia in both lower lobes, greater on the right with stable underlying interstitial fibrosis and peripheral honeycombing. 5. Extensive dense calcified coronary artery atherosclerosis. 6. Moderately large hiatal hernia. Electronically Signed   By: Beckie Salts M.D.   On: 09/03/2016 17:10   Dg Chest Port 1 View  Result Date:  09/03/2016 CLINICAL DATA:  83 year old female under evaluation for are central line placement. EXAM: PORTABLE CHEST 1 VIEW COMPARISON:  Chest x-ray 09/03/2016. FINDINGS: New left upper extremity PICC with tip terminating in the distal superior vena cava. An endotracheal tube is in place with tip 5.1 cm above the carina. Lung volumes are  low. Diffuse interstitial prominence throughout the lungs bilaterally, most evident throughout the mid to lower lungs, compatible with underlying interstitial lung disease. No definite acute consolidative airspace disease. Lateral pleural fluid or pleural thickening noted in the right mid hemithorax. No definite left pleural effusion. No evidence of pulmonary edema. Heart size is borderline enlarged. Large hiatal hernia. Upper mediastinal contours are distorted by patient positioning. Aortic atherosclerosis. IMPRESSION: 1. Support apparatus, as above. 2. Chronic changes of interstitial lung disease, similar to prior studies, presumably from underlying usual interstitial pneumonia (UIP). 3. Lateral pleural thickening or lateral loculated pleural fluid in the right mid hemithorax. 4. Aortic atherosclerosis. 5. Large hiatal hernia. Electronically Signed   By: Trudie Reed M.D.   On: 09/03/2016 19:29   Dg Chest Portable 1 View  Result Date: 09/03/2016 CLINICAL DATA:  Intubation, hypertension, CHF, idiopathic pulmonary fibrosis, atrial fibrillation EXAM: PORTABLE CHEST 1 VIEW COMPARISON:  Portable exam 1312 hours compared to 1138 hours FINDINGS: Tip of endotracheal tube projects 3.2 cm above carina. Stable heart size. Slightly rotated to the RIGHT which may account for prominence of the RIGHT hilum. Diffuse interstitial infiltrates compatible pulmonary fibrosis. No definite superimposed acute infiltrate, pleural effusion or pneumothorax. Bones demineralized. IMPRESSION: Pulmonary fibrosis. Endotracheal tube tip projects 3.2 cm above carina. Electronically Signed   By: Ulyses Southward M.D.   On: 09/03/2016 13:39   Dg Chest Port 1 View  Result Date: 09/03/2016 CLINICAL DATA:  Fever and altered mental status. EXAM: PORTABLE CHEST 1 VIEW COMPARISON:  03/07/2016 and CT 02/07/2014 FINDINGS: Patient slightly rotated to the right. Lungs are adequately inflated demonstrate diffuse predominant peripheral bilateral  interstitial disease compatible with known pulmonary fibrosis. No focal confluent airspace disease. No definite effusion. Stable cardiomegaly. Moderate size hiatal hernia. Calcified plaque over the aortic arch. Remainder of the exam is unchanged. IMPRESSION: No acute cardiopulmonary disease. Evidence of known pulmonary fibrosis. Stable cardiomegaly. Aortic Atherosclerosis (ICD10-I70.0). Electronically Signed   By: Elberta Fortis M.D.   On: 09/03/2016 11:52   Dg Abd Portable 1v  Result Date: 09/04/2016 CLINICAL DATA:  Orogastric tube placement EXAM: PORTABLE ABDOMEN - 1 VIEW COMPARISON:  Same day CT abdomen and pelvis FINDINGS: The tip and side ports in a gastric tube are seen below the left hemidiaphragm. There appears to be a large hiatal hernia causing leftward deviation of the gastric tube as it courses over the lower mediastinum. The heart is enlarged. There is aortic atherosclerosis with slight uncoiling of the thoracic aorta. Interstitial and alveolar airspace disease is noted at the lung bases left greater than right. The patient is status post the level kyphoplasty and what appears to be T12, L3 and L4. Numerous injection granulomas project over the buttocks bilaterally. IMPRESSION: 1. The tip and side port of a gastric tube are seen just below the left hemidiaphragm presumably in the stomach. There is a large hiatal hernia above the diaphragm. 2. There is cardiomegaly with aortic atherosclerosis and chronic interstitial and alveolar airspace disease. 3. Kyphoplasties of the lower thoracic and lumbar spine. Electronically Signed   By: Tollie Eth M.D.   On: 09/04/2016 00:50   US Abdomen Limited Ruq  Result Date: 09/03/2016 CLINICAL DATA:  Abnormal CT.  Cholecystitis. EXAM: ULTRASOUND ABDOMEN LIMITED RIGHT UPPER QUADRANT COMPARISON:  09/03/2016 CT FINDINGS: Gallbladder: Diffuse thickening and distention of the gallbladder with the gallbladder wall measuring up to 6 mm in thickness. There is biliary  sludge and layering calculi along the dependent wall. There is pericholecystic fluid. The patient is ventilated and therefore assessment for a sonographic Eulah Pont sign could not be performed. Common bile duct: Diameter: 4.4 mm Liver: No space-occupying mass of the included liver. No intrahepatic ductal dilatation. Small amount of free fluid is seen adjacent to the edge of the liver. Hepatopetal main portal vein flow on color Doppler imaging. IMPRESSION: Gallbladder distention with mural thickening, pericholecystic fluid, biliary sludge and calculi in keeping with changes of acute cholecystitis. Electronically Signed   By: Tollie Eth M.D.   On: 09/03/2016 21:36    IMAGING/STUDIES: CT HEAD W/O 8/11:  Prominent atrophy with minimal small vessel chronic ischemic changes of deep cerebral white matter. No acute intracranial abnormalities. CT ABDOMEN/PELVIS 8/11: IMPRESSION: 1. Findings compatible with cystic duct obstruction with marked changes of acute cholecystitis. A right upper quadrant abdomen ultrasound may provide useful additional information. 2. Small amount of free peritoneal fluid. 3. Diffuse hepatic steatosis and transient enhancement phenomenon in the right lobe. 4. Interval atelectasis or pneumonia in both lower lobes, greater on the right with stable underlying interstitial fibrosis and peripheral honeycombing. 5. Extensive dense calcified coronary artery atherosclerosis. 6. Moderately large hiatal hernia. RUQ U/S 8/11:  Gallbladder distention with mural thickening, pericholecystic fluid, biliary sludge and calculi in keeping with changes of acute cholecystitis. TTE 8/12 >>>  MICROBIOLOGY: MRSA PCR 8/11:  Negative Urine Culture 8/11 >>> Blood Cultures x2 8/11 >>> 2/2 Positive GNRs  ANTIBIOTICS: Rocephin 8/11 (x1 dose) Flagyl 8/11 (x1 dose) Zosyn 8/11 >>> Vancomycin 8/11 >>>  LINES/TUBES: OETT 8/11 - 8/12 (self-extubation) L Ennis CVL 8/11 >>> Foley 8/11 >>> PIV  SIGNIFICANT  EVENTS: 08/11 - Admit 08/12 - Self-extubation in AM  ASSESSMENT / PLAN:  PULMONARY A: Acute Hypoxic Respiratory Failure:  Self-extubated 8/12. Improving. H/O IPF:  Has known PM.  H/O OSA  P:   Continuous pulse oximetry monitoring Weaning FiO2 Incentive spirometry for pulmonary toilette   CARDIOVASCULAR A:  Shock:  Likely due to sepsis. H/O Systolic CHF H/O Atrial Fibrillation H/O Hyperlipidemia H/O Essential Hypertension  P:  Switching from Levophed to Neo-Synephrine Goal MAP >65 & SBP >90 TTE pending Monitoring vital signs per unit protocol Continuous telemetry monitoring Holding Lasix & Coreg  RENAL A:   No acute issues.  P:   Trending UOP with Foley Monitoring renal function & electrolytes daily Checking Stat Magnesium  GASTROINTESTINAL A:   Acute Calculous Cholecystitis H/O GERD  P:   NPO except ice chips Protonix IV qhs Appreciate assistance from IR & Surgery Serivce Planned for Perc Drain on 8/13 Trending LFTs daily   HEMATOLOGIC A:   Leukocytosis:  Secondary to sepsis. H/O Anemia Chronic NOAC:  Eliquis for Atrial Fibrillation.  P:  Holding Eliquis Trending cell counts daily w/ CBC  INFECTIOUS A:   Severe Sepsis Acute Calculous Cholecystitis Gram Negative Rod Bacteremia  P:   Empiric Vancomycin & Zosyn Trending Procalcitonin per algorithm Awaiting finalization of cultures TTE pending   ENDOCRINE A:   H/O Hypothyroidism:  TSH 0.751 & Total T4 6.0.  P:   Holding Synthroid  NEUROLOGIC A:   Acute Encephalopathy:  Likely due to toxic metabolic. Possible Abdominal Pain:  None at present. Questionable H/O Dementia H/O Depression  P:  Discontinuing sedatives Holding Lexapro, Risperdal, Neurontin, & Ultram  Prophylaxis:  SCDs & Protonix IV qhs. Diet:  Ice Chips for now. Code Status:  DNR as per previous physician discussions. Disposition:  Remains critically ill in the ICU. Family Update:  No family at bedside at the time  of my exam.  DISCUSSION:  83 y.o. female with septic shock secondary to gram-negative rod bacteremia likely from calculous cholecystitis. Plan for percutaneous cholecystostomy drain tomorrow. Appreciate input from interventional radiology and general surgery. Respiratory status stable post self extubation.  I have spent a total of 32 minutes of critical care time today caring for the patient and reviewing the patient's electronic medical record.   Donna Christen Jamison Neighbor, M.D. [facility] Pulmonary & Critical Care Pager:  [phone] After 3pm or if no response, call [phone] 09/04/2016, 10:14 AM

## 2016-09-04 NOTE — Progress Notes (Signed)
Subjective/Chief Complaint: Denies pain but is demented   Objective: Vital signs in last 24 hours: Temp:  [97.4 F (36.3 C)-103.8 F (39.9 C)] 97.4 F (36.3 C) (08/12 0800) Pulse Rate:  [99-162] 133 (08/12 0900) Resp:  [0-38] 23 (08/12 0900) BP: (70-123)/(50-95) 86/66 (08/12 0900) SpO2:  [88 %-100 %] 99 % (08/12 0900) FiO2 (%):  [40 %-100 %] 40 % (08/12 0400) Weight:  [55.5 kg (122 lb 5.7 oz)-55.7 kg (122 lb 12.7 oz)] 55.5 kg (122 lb 5.7 oz) (08/12 0413)    Intake/Output from previous day: 08/11 0701 - 08/12 0700 In: 5815.3 [I.V.:2465.3; IV Piggyback:3350] Out: 535 [Urine:535] Intake/Output this shift: Total I/O In: 75 [I.V.:75] Out: -   General appearance: cooperative Abd soft, min TTP RUQ Lungs CTA CV S1 S2 Lab Results:   Recent Labs  09/03/16 1137 09/03/16 1204 09/04/16 0303  WBC 8.8  --  21.6*  HGB 12.5 14.3 10.0*  HCT 38.9 42.0 30.7*  PLT 206  --  193   BMET  Recent Labs  09/03/16 1137 09/03/16 1204 09/04/16 0303  NA 139 141 139  K 4.0 4.1 3.7  CL 103 101 104  CO2 27  --  23  GLUCOSE 119* 114* 111*  BUN 19 25* 15  CREATININE 0.83 0.70 0.81  CALCIUM 8.5*  --  7.7*   PT/INR No results for input(s): LABPROT, INR in the last 72 hours. ABG  Recent Labs  09/03/16 1431 09/04/16 0408  PHART 7.380 7.383  HCO3 29.2* 26.2    Studies/Results: Ct Head Wo Contrast  Result Date: 09/03/2016 CLINICAL DATA:  Found down at nursing home, respiratory distress, altered level of consciousness, history hypertension, dementia, CHF, pulmonary fibrosis EXAM: CT HEAD WITHOUT CONTRAST TECHNIQUE: Contiguous axial images were obtained from the base of the skull through the vertex without intravenous contrast. Sagittal and coronal MPR images reconstructed from axial data set. COMPARISON:  03/07/2016 FINDINGS: Brain: Generalized atrophy. Normal ventricular morphology. No midline shift or mass effect. Minimal small vessel chronic ischemic changes of deep cerebral  white matter. No intracranial hemorrhage, mass lesion, evidence of acute infarction, or extra-axial fluid collection. Vascular: Atherosclerotic calcification of internal carotid arteries at skullbase Skull: Intact Sinuses/Orbits: Clear.  Dependent fluid in the nasopharynx Other: N/A IMPRESSION: Prominent atrophy with minimal small vessel chronic ischemic changes of deep cerebral white matter. No acute intracranial abnormalities. Electronically Signed   By: Ulyses Southward M.D.   On: 09/03/2016 16:45   Ct Abdomen Pelvis W Contrast  Result Date: 09/03/2016 CLINICAL DATA:  Abdominal pain.  Respiratory distress. EXAM: CT ABDOMEN AND PELVIS WITH CONTRAST TECHNIQUE: Multidetector CT imaging of the abdomen and pelvis was performed using the standard protocol following bolus administration of intravenous contrast. CONTRAST:  80mL ISOVUE-300 IOPAMIDOL (ISOVUE-300) INJECTION 61% COMPARISON:  Chest CT dated 02/07/2014. Right upper quadrant abdomen ultrasound dated 11/20/2015. FINDINGS: Lower chest: Again demonstrated are fibrotic changes with peripheral honeycombing at both lung bases. There is interval increased density in the posterior aspects of both lower lobes, greater on the right. No pleural fluid seen. Moderately large hiatal hernia. Enlarged heart. Small pericardial effusion with a maximum thickness of 11 mm. Extensive atheromatous coronary artery calcifications. Hepatobiliary: Interval diffuse low density of the liver relative to the spleen. There is a linear branching area of increased enhancement in the right lobe of the liver on the initial images, not seen on the delayed images. There is extensive pericholecystic fluid and soft tissue stranding. Also demonstrated is marked dilatation of the cystic  duct with mucosal enhancement and dilatation of the gallbladder with mucosal enhancement. No calcified gallstones visualized. No gallstones visualized on the previous ultrasound. Pancreas: Pancreatic atrophy. Spleen:  Normal in size without focal abnormality. Adrenals/Urinary Tract: Adrenal glands are unremarkable. Kidneys are normal, without renal calculi, focal lesion, or hydronephrosis. Bladder is unremarkable. Stomach/Bowel: Moderately large hiatal hernia. No small bowel or colonic abnormalities demonstrated. No evidence of appendicitis, surgically absent by history. Vascular/Lymphatic: Arterial calcifications, including dense coronary artery calcifications. No aneurysm or enlarged lymph nodes. Reproductive: Small uterus.  No adnexal masses. Other: Small amount of free peritoneal fluid. No abdominal wall hernia seen. Musculoskeletal: Multiple lumbar and lower thoracic vertebral compression deformities, some with kyphoplasty material. No acute fracture lines seen. Mild bilateral hip degenerative changes. IMPRESSION: 1. Findings compatible with cystic duct obstruction with marked changes of acute cholecystitis. A right upper quadrant abdomen ultrasound may provide useful additional information. 2. Small amount of free peritoneal fluid. 3. Diffuse hepatic steatosis and transient enhancement phenomenon in the right lobe. 4. Interval atelectasis or pneumonia in both lower lobes, greater on the right with stable underlying interstitial fibrosis and peripheral honeycombing. 5. Extensive dense calcified coronary artery atherosclerosis. 6. Moderately large hiatal hernia. Electronically Signed   By: Beckie SaltsSteven  Reid M.D.   On: 09/03/2016 17:10   Dg Chest Port 1 View  Result Date: 09/03/2016 CLINICAL DATA:  83 year old female under evaluation for are central line placement. EXAM: PORTABLE CHEST 1 VIEW COMPARISON:  Chest x-ray 09/03/2016. FINDINGS: New left upper extremity PICC with tip terminating in the distal superior vena cava. An endotracheal tube is in place with tip 5.1 cm above the carina. Lung volumes are low. Diffuse interstitial prominence throughout the lungs bilaterally, most evident throughout the mid to lower lungs,  compatible with underlying interstitial lung disease. No definite acute consolidative airspace disease. Lateral pleural fluid or pleural thickening noted in the right mid hemithorax. No definite left pleural effusion. No evidence of pulmonary edema. Heart size is borderline enlarged. Large hiatal hernia. Upper mediastinal contours are distorted by patient positioning. Aortic atherosclerosis. IMPRESSION: 1. Support apparatus, as above. 2. Chronic changes of interstitial lung disease, similar to prior studies, presumably from underlying usual interstitial pneumonia (UIP). 3. Lateral pleural thickening or lateral loculated pleural fluid in the right mid hemithorax. 4. Aortic atherosclerosis. 5. Large hiatal hernia. Electronically Signed   By: Trudie Reedaniel  Entrikin M.D.   On: 09/03/2016 19:29   Dg Chest Portable 1 View  Result Date: 09/03/2016 CLINICAL DATA:  Intubation, hypertension, CHF, idiopathic pulmonary fibrosis, atrial fibrillation EXAM: PORTABLE CHEST 1 VIEW COMPARISON:  Portable exam 1312 hours compared to 1138 hours FINDINGS: Tip of endotracheal tube projects 3.2 cm above carina. Stable heart size. Slightly rotated to the RIGHT which may account for prominence of the RIGHT hilum. Diffuse interstitial infiltrates compatible pulmonary fibrosis. No definite superimposed acute infiltrate, pleural effusion or pneumothorax. Bones demineralized. IMPRESSION: Pulmonary fibrosis. Endotracheal tube tip projects 3.2 cm above carina. Electronically Signed   By: Ulyses SouthwardMark  Boles M.D.   On: 09/03/2016 13:39   Dg Chest Port 1 View  Result Date: 09/03/2016 CLINICAL DATA:  Fever and altered mental status. EXAM: PORTABLE CHEST 1 VIEW COMPARISON:  03/07/2016 and CT 02/07/2014 FINDINGS: Patient slightly rotated to the right. Lungs are adequately inflated demonstrate diffuse predominant peripheral bilateral interstitial disease compatible with known pulmonary fibrosis. No focal confluent airspace disease. No definite effusion.  Stable cardiomegaly. Moderate size hiatal hernia. Calcified plaque over the aortic arch. Remainder of the exam is unchanged. IMPRESSION: No  acute cardiopulmonary disease. Evidence of known pulmonary fibrosis. Stable cardiomegaly. Aortic Atherosclerosis (ICD10-I70.0). Electronically Signed   By: Elberta Fortis M.D.   On: 09/03/2016 11:52   Dg Abd Portable 1v  Result Date: 09/04/2016 CLINICAL DATA:  Orogastric tube placement EXAM: PORTABLE ABDOMEN - 1 VIEW COMPARISON:  Same day CT abdomen and pelvis FINDINGS: The tip and side ports in a gastric tube are seen below the left hemidiaphragm. There appears to be a large hiatal hernia causing leftward deviation of the gastric tube as it courses over the lower mediastinum. The heart is enlarged. There is aortic atherosclerosis with slight uncoiling of the thoracic aorta. Interstitial and alveolar airspace disease is noted at the lung bases left greater than right. The patient is status post the level kyphoplasty and what appears to be T12, L3 and L4. Numerous injection granulomas project over the buttocks bilaterally. IMPRESSION: 1. The tip and side port of a gastric tube are seen just below the left hemidiaphragm presumably in the stomach. There is a large hiatal hernia above the diaphragm. 2. There is cardiomegaly with aortic atherosclerosis and chronic interstitial and alveolar airspace disease. 3. Kyphoplasties of the lower thoracic and lumbar spine. Electronically Signed   By: Tollie Eth M.D.   On: 09/04/2016 00:50   US Abdomen Limited Ruq  Result Date: 09/03/2016 CLINICAL DATA:  Abnormal CT.  Cholecystitis. EXAM: ULTRASOUND ABDOMEN LIMITED RIGHT UPPER QUADRANT COMPARISON:  09/03/2016 CT FINDINGS: Gallbladder: Diffuse thickening and distention of the gallbladder with the gallbladder wall measuring up to 6 mm in thickness. There is biliary sludge and layering calculi along the dependent wall. There is pericholecystic fluid. The patient is ventilated and therefore  assessment for a sonographic Eulah Pont sign could not be performed. Common bile duct: Diameter: 4.4 mm Liver: No space-occupying mass of the included liver. No intrahepatic ductal dilatation. Small amount of free fluid is seen adjacent to the edge of the liver. Hepatopetal main portal vein flow on color Doppler imaging. IMPRESSION: Gallbladder distention with mural thickening, pericholecystic fluid, biliary sludge and calculi in keeping with changes of acute cholecystitis. Electronically Signed   By: Tollie Eth M.D.   On: 09/03/2016 21:36    Anti-infectives: Anti-infectives    Start     Dose/Rate Route Frequency Ordered Stop   09/04/16 1200  vancomycin (VANCOCIN) IVPB 1000 mg/200 mL premix  Status:  Discontinued     1,000 mg 200 mL/hr over 60 Minutes Intravenous Every 24 hours 09/03/16 1443 09/04/16 0828   09/03/16 2200  piperacillin-tazobactam (ZOSYN) IVPB 3.375 g  Status:  Discontinued     3.375 g 100 mL/hr over 30 Minutes Intravenous Every 8 hours 09/03/16 1858 09/03/16 1901   09/03/16 2200  piperacillin-tazobactam (ZOSYN) IVPB 3.375 g     3.375 g 12.5 mL/hr over 240 Minutes Intravenous Every 8 hours 09/03/16 1902     09/03/16 1600  metroNIDAZOLE (FLAGYL) IVPB 500 mg  Status:  Discontinued     500 mg 100 mL/hr over 60 Minutes Intravenous Every 8 hours 09/03/16 1418 09/03/16 1858   09/03/16 1500  cefTRIAXone (ROCEPHIN) 2 g in dextrose 5 % 50 mL IVPB  Status:  Discontinued     2 g 100 mL/hr over 30 Minutes Intravenous Every 12 hours 09/03/16 1418 09/03/16 1858   09/03/16 1430  vancomycin (VANCOCIN) 500 mg in sodium chloride 0.9 % 100 mL IVPB  Status:  Discontinued     500 mg 100 mL/hr over 60 Minutes Intravenous Every 12 hours 09/03/16 1418 09/03/16 1443  09/03/16 1145  vancomycin (VANCOCIN) IVPB 1000 mg/200 mL premix     1,000 mg 200 mL/hr over 60 Minutes Intravenous  Once 09/03/16 1134 09/03/16 1330   09/03/16 1145  piperacillin-tazobactam (ZOSYN) IVPB 3.375 g     3.375 g 100 mL/hr  over 30 Minutes Intravenous  Once 09/03/16 1134 09/03/16 1239      Assessment/Plan: Cholecystitis - IR drain if family wants aggressive TX. I D/W Dr. Bonnielee Haff - they will plan tomorrow (last Eliquis 8/11) if family wishes.   LOS: 1 day    [name] [name] 09/04/2016

## 2016-09-04 NOTE — Progress Notes (Signed)
RT instructed pt and family on the use of incentive spirometer.  Pt able to reach 250 mL with encouragement.

## 2016-09-04 NOTE — Progress Notes (Signed)
  Echocardiogram 2D Echocardiogram has been performed.  [name] [name] [name] 09/04/2016, 10:43 AM

## 2016-09-04 NOTE — Progress Notes (Signed)
Pt pulled out foley catheter.  Denies pain/discomfort at this time.  Purewick external catheter placed.  Safety mitts reapplied.  Bed alarm on.  Continuing to monitor.

## 2016-09-04 NOTE — Consult Note (Signed)
Chief Complaint: Patient was seen in consultation today for percutaneous cholecystostomy drain placement Chief Complaint  Patient presents with  . Altered Mental Status   at the request of Dr Barrie Dunker  Referring Physician(s): Dr Waldron Session  Supervising Physician: Jolaine Click  Patient Status: [facility] - In-pt  History of Present Illness: [name] [name] is a 83 y.o. female   Hx Afib - on Eliquis---LD 8/11 Admitted with sepsis; abd pain +BC; leukocytosis  [place] yesterday IMPRESSION: Gallbladder distention with mural thickening, pericholecystic fluid, biliary sludge and calculi in keeping with changes of acute cholecystitis.  Request for percutaneous cholecystostomy drain placement per CCS and CCM Deemed not candidate for surgery per Dr Magnus Ivan Note: Acute acalculous cholecystitis with sepsis I had a long discussion with the patient's family regarding [name] [name]. She is not an operative candidate for cholecystectomy. We discussed doing an ultrasound and asking interventional radiology to see for a percutaneous cholecystostomy. 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  Given her anticoagulation, she cannot have her procedure done today. I believe we need to check her coags and consult interventional radiologist for their opinion. I discussed the potential for multisystem organ failure with the family and that even with a percutaneous drain that her prognosis is poor and her overall sepsis may not be reversible..  We will continue supportive care and IV anabiotic's for now as the family makes her decisions.  We'll continue to follow her closely with you  Imaging reviewed with Dr Bonnielee Haff He approves procedure For 8/13---after off Eliquis 48 hrs----okayed with Dr Elwyn Lade  Past Medical History:  Diagnosis Date  . Anemia   . Anxiety   . Arthritis   . Atrial fibrillation (HCC)   . CHF (congestive heart failure) (HCC)   . Dementia   . Depression   .  Diverticulosis of colon   . History of alcohol abuse   . Hyperlipidemia   . Hypertension   . IPF (idiopathic pulmonary fibrosis) (HCC)   . Irritable bowel syndrome   . OSA (obstructive sleep apnea)   . Osteoporosis   . PMR (polymyalgia rheumatica) (HCC)   . Postherpetic neuralgia   . Spinal stenosis   . Thyroid disease   . Vertebral fracture   . Vitamin D deficiency     Past Surgical History:  Procedure Laterality Date  . APPENDECTOMY    . BREAST BIOPSY    . CATARACT EXTRACTION    . DILATION AND CURETTAGE OF UTERUS     x several  . KNEE ARTHROSCOPY  10/2007 and 10/2010   left  . REPLACEMENT TOTAL KNEE  august 2012  . ROTATOR CUFF REPAIR    . VERTEBROPLASTY     x 3    Allergies: Clindamycin hcl; Rofecoxib; Celecoxib; and Sertraline hcl  Medications: Prior to Admission medications   Medication Sig Start Date End Date Taking? Authorizing Provider  acetaminophen (TYLENOL) 500 MG tablet Take 1,000 mg by mouth every 12 (twelve) hours as needed (moderate to severe pain).    Yes [provider]  carvedilol (COREG) 3.125 MG tablet TAKE 1 TABLET TWICE DAILY. Patient taking differently: TAKE 1 TABLET (3.125 MG) BY MOUTH TWICE DAILY. 02/02/16  Yes Nelwyn Salisbury, MD  ELIQUIS 2.5 MG TABS tablet TAKE 1 TABLET TWICE DAILY. Patient taking differently: TAKE 1 TABLET(2.5 MG) BY MOUTH TWICE DAILY. 03/21/16  Yes Nelwyn Salisbury, MD  ENSURE (ENSURE) Take by mouth 2 (two) times daily.   Yes  [provider]  escitalopram (LEXAPRO) 10 MG tablet Take 1 tablet (10 mg total) by mouth daily. 02/03/16  Yes Nelwyn SalisburyFry, Stephen A, MD  ferrous sulfate 325 (65 FE) MG tablet TAKE 1 TABLET TWICE DAILY. Patient taking differently: TAKE 1 TABLET (325 MG) BY MOUTH TWICE DAILY. 03/29/16  Yes Nelwyn SalisburyFry, Stephen A, MD  furosemide (LASIX) 40 MG tablet Take 1 tablet (40 mg total) by mouth daily. 11/24/15  Yes Johnson, Clanford L, MD  gabapentin (NEURONTIN) 100 MG capsule TAKE 1 CAPSULE EVERY DAY. Patient taking  differently: TAKE 1 CAPSULE (100 MG) BY MOUTH EVERY DAY 02/22/16  Yes Nelwyn SalisburyFry, Stephen A, MD  levothyroxine (SYNTHROID, LEVOTHROID) 75 MCG tablet Take 75 mcg by mouth daily before breakfast.   Yes [provider]  magnesium oxide (MAG-OX) 400 MG tablet Take 800 mg by mouth daily. For constipation   Yes [provider]  pantoprazole (PROTONIX) 40 MG tablet Take 1 tablet (40 mg total) by mouth daily. 05/15/15  Yes Azalee CourseMeng, Hao, PA  potassium chloride (KLOR-CON) 20 MEQ packet Take 20 mEq by mouth 2 (two) times daily. 02/03/16  Yes Nelwyn SalisburyFry, Stephen A, MD  risperiDONE (RISPERDAL) 0.5 MG tablet Once daily at suppertime Patient taking differently: 0.5 mg daily with supper.  08/30/16  Yes Nelwyn SalisburyFry, Stephen A, MD  traMADol (ULTRAM) 50 MG tablet Take 2 tablets (100 mg total) by mouth every 6 (six) hours as needed for moderate pain. Patient taking differently: Take 100 mg by mouth every 6 (six) hours as needed for moderate pain or severe pain.  08/30/16  Yes Nelwyn SalisburyFry, Stephen A, MD     Family History  Problem Relation Age of Onset  . Stroke Father [id number]  . Heart attack Father   . Coronary artery disease Father [id number]  . Alcohol abuse Mother   . Breast cancer Sister   . Heart disease Sister   . Colon cancer Neg Hx     Social History   Social History  . Marital status: Married    Spouse name: N/A  . Number of children: N/A  . Years of education: N/A   Social History Main Topics  . Smoking status: Former Smoker    Packs/day: 1.00    Years: 20.00    Types: Cigarettes    Quit date: 01/25/1988  . Smokeless tobacco: Never Used  . Alcohol use 0.0 oz/week     Comment: 2 glasses every day  . Drug use: No  . Sexual activity: Not Asked   Other Topics Concern  . None   Social History Narrative  . None    Review of Systems: A 12 point ROS discussed and pertinent positives are indicated in the HPI above.  All other systems are negative.  Review of Systems  Constitutional: Positive for activity change.  Negative for fever.  Respiratory: Negative for shortness of breath.   Gastrointestinal: Positive for abdominal pain.  Neurological: Positive for weakness.  Psychiatric/Behavioral: Positive for confusion. Negative for behavioral problems.    Vital Signs: BP (!) 86/66   Pulse (!) 133   Temp (!) 97.4 F (36.3 C)   Resp (!) 23   Wt 122 lb 5.7 oz (55.5 kg)   SpO2 99%   BMI 19.16 kg/m   Physical Exam  Cardiovascular:  Irreg rate  Pulmonary/Chest: Effort normal.  Abdominal: Soft. Bowel sounds are normal.  Musculoskeletal: Normal range of motion.  Neurological: She is alert.  Skin: Skin is warm and dry.  Psychiatric:  Consented with family at bedside Husband  signed consent---in chart  Nursing note and vitals reviewed.   Mallampati Score:  MD Evaluation Airway: WNL Heart: WNL Abdomen: WNL Chest/ Lungs: WNL ASA  Classification: 3 Mallampati/Airway Score: One  Imaging: Dg Thoracic Spine W/swimmers  Result Date: 08/27/2016 CLINICAL DATA:  83 year old female with chronic mid back pain. EXAM: THORACIC SPINE - 3 VIEWS COMPARISON:  03/07/2016 and prior radiographs FINDINGS: Diffuse osteopenia noted. Compression fractures of T11, T12 and L1 are again noted with T12 vertebral augmentation changes again identified. No definite acute fracture or subluxation noted. No suspicious bony lesions are present. IMPRESSION: Diffuse osteopenia without definite acute bony abnormality. Chronic compression fractures of T11, T12 and L1. Electronically Signed   By: Harmon Pier M.D.   On: 08/27/2016 11:35   Ct Head Wo Contrast  Result Date: 09/03/2016 CLINICAL DATA:  Found down at nursing home, respiratory distress, altered level of consciousness, history hypertension, dementia, CHF, pulmonary fibrosis EXAM: CT HEAD WITHOUT CONTRAST TECHNIQUE: Contiguous axial images were obtained from the base of the skull through the vertex without intravenous contrast. Sagittal and coronal MPR images reconstructed  from axial data set. COMPARISON:  03/07/2016 FINDINGS: Brain: Generalized atrophy. Normal ventricular morphology. No midline shift or mass effect. Minimal small vessel chronic ischemic changes of deep cerebral white matter. No intracranial hemorrhage, mass lesion, evidence of acute infarction, or extra-axial fluid collection. Vascular: Atherosclerotic calcification of internal carotid arteries at skullbase Skull: Intact Sinuses/Orbits: Clear.  Dependent fluid in the nasopharynx Other: N/A IMPRESSION: Prominent atrophy with minimal small vessel chronic ischemic changes of deep cerebral white matter. No acute intracranial abnormalities. Electronically Signed   By: Ulyses Southward M.D.   On: 09/03/2016 16:45   Ct Abdomen Pelvis W Contrast  Result Date: 09/03/2016 CLINICAL DATA:  Abdominal pain.  Respiratory distress. EXAM: CT ABDOMEN AND PELVIS WITH CONTRAST TECHNIQUE: Multidetector CT imaging of the abdomen and pelvis was performed using the standard protocol following bolus administration of intravenous contrast. CONTRAST:  80mL ISOVUE-300 IOPAMIDOL (ISOVUE-300) INJECTION 61% COMPARISON:  Chest CT dated 02/07/2014. Right upper quadrant abdomen ultrasound dated 11/20/2015. FINDINGS: Lower chest: Again demonstrated are fibrotic changes with peripheral honeycombing at both lung bases. There is interval increased density in the posterior aspects of both lower lobes, greater on the right. No pleural fluid seen. Moderately large hiatal hernia. Enlarged heart. Small pericardial effusion with a maximum thickness of 11 mm. Extensive atheromatous coronary artery calcifications. Hepatobiliary: Interval diffuse low density of the liver relative to the spleen. There is a linear branching area of increased enhancement in the right lobe of the liver on the initial images, not seen on the delayed images. There is extensive pericholecystic fluid and soft tissue stranding. Also demonstrated is marked dilatation of the cystic duct with  mucosal enhancement and dilatation of the gallbladder with mucosal enhancement. No calcified gallstones visualized. No gallstones visualized on the previous ultrasound. Pancreas: Pancreatic atrophy. Spleen: Normal in size without focal abnormality. Adrenals/Urinary Tract: Adrenal glands are unremarkable. Kidneys are normal, without renal calculi, focal lesion, or hydronephrosis. Bladder is unremarkable. Stomach/Bowel: Moderately large hiatal hernia. No small bowel or colonic abnormalities demonstrated. No evidence of appendicitis, surgically absent by history. Vascular/Lymphatic: Arterial calcifications, including dense coronary artery calcifications. No aneurysm or enlarged lymph nodes. Reproductive: Small uterus.  No adnexal masses. Other: Small amount of free peritoneal fluid. No abdominal wall hernia seen. Musculoskeletal: Multiple lumbar and lower thoracic vertebral compression deformities, some with kyphoplasty material. No acute fracture lines seen. Mild bilateral hip degenerative changes. IMPRESSION: 1. Findings compatible with  cystic duct obstruction with marked changes of acute cholecystitis. A right upper quadrant abdomen ultrasound may provide useful additional information. 2. Small amount of free peritoneal fluid. 3. Diffuse hepatic steatosis and transient enhancement phenomenon in the right lobe. 4. Interval atelectasis or pneumonia in both lower lobes, greater on the right with stable underlying interstitial fibrosis and peripheral honeycombing. 5. Extensive dense calcified coronary artery atherosclerosis. 6. Moderately large hiatal hernia. Electronically Signed   By: Beckie Salts M.D.   On: 09/03/2016 17:10   Dg Chest Port 1 View  Result Date: 09/03/2016 CLINICAL DATA:  83 year old female under evaluation for are central line placement. EXAM: PORTABLE CHEST 1 VIEW COMPARISON:  Chest x-ray 09/03/2016. FINDINGS: New left upper extremity PICC with tip terminating in the distal superior vena cava. An  endotracheal tube is in place with tip 5.1 cm above the carina. Lung volumes are low. Diffuse interstitial prominence throughout the lungs bilaterally, most evident throughout the mid to lower lungs, compatible with underlying interstitial lung disease. No definite acute consolidative airspace disease. Lateral pleural fluid or pleural thickening noted in the right mid hemithorax. No definite left pleural effusion. No evidence of pulmonary edema. Heart size is borderline enlarged. Large hiatal hernia. Upper mediastinal contours are distorted by patient positioning. Aortic atherosclerosis. IMPRESSION: 1. Support apparatus, as above. 2. Chronic changes of interstitial lung disease, similar to prior studies, presumably from underlying usual interstitial pneumonia (UIP). 3. Lateral pleural thickening or lateral loculated pleural fluid in the right mid hemithorax. 4. Aortic atherosclerosis. 5. Large hiatal hernia. Electronically Signed   By: Trudie Reed M.D.   On: 09/03/2016 19:29   Dg Chest Portable 1 View  Result Date: 09/03/2016 CLINICAL DATA:  Intubation, hypertension, CHF, idiopathic pulmonary fibrosis, atrial fibrillation EXAM: PORTABLE CHEST 1 VIEW COMPARISON:  Portable exam 1312 hours compared to 1138 hours FINDINGS: Tip of endotracheal tube projects 3.2 cm above carina. Stable heart size. Slightly rotated to the RIGHT which may account for prominence of the RIGHT hilum. Diffuse interstitial infiltrates compatible pulmonary fibrosis. No definite superimposed acute infiltrate, pleural effusion or pneumothorax. Bones demineralized. IMPRESSION: Pulmonary fibrosis. Endotracheal tube tip projects 3.2 cm above carina. Electronically Signed   By: Ulyses Southward M.D.   On: 09/03/2016 13:39   Dg Chest Port 1 View  Result Date: 09/03/2016 CLINICAL DATA:  Fever and altered mental status. EXAM: PORTABLE CHEST 1 VIEW COMPARISON:  03/07/2016 and CT 02/07/2014 FINDINGS: Patient slightly rotated to the right. Lungs are  adequately inflated demonstrate diffuse predominant peripheral bilateral interstitial disease compatible with known pulmonary fibrosis. No focal confluent airspace disease. No definite effusion. Stable cardiomegaly. Moderate size hiatal hernia. Calcified plaque over the aortic arch. Remainder of the exam is unchanged. IMPRESSION: No acute cardiopulmonary disease. Evidence of known pulmonary fibrosis. Stable cardiomegaly. Aortic Atherosclerosis (ICD10-I70.0). Electronically Signed   By: Elberta Fortis M.D.   On: 09/03/2016 11:52   Dg Abd Portable 1v  Result Date: 09/04/2016 CLINICAL DATA:  Orogastric tube placement EXAM: PORTABLE ABDOMEN - 1 VIEW COMPARISON:  Same day CT abdomen and pelvis FINDINGS: The tip and side ports in a gastric tube are seen below the left hemidiaphragm. There appears to be a large hiatal hernia causing leftward deviation of the gastric tube as it courses over the lower mediastinum. The heart is enlarged. There is aortic atherosclerosis with slight uncoiling of the thoracic aorta. Interstitial and alveolar airspace disease is noted at the lung bases left greater than right. The patient is status post the level kyphoplasty and  what appears to be T12, L3 and L4. Numerous injection granulomas project over the buttocks bilaterally. IMPRESSION: 1. The tip and side port of a gastric tube are seen just below the left hemidiaphragm presumably in the stomach. There is a large hiatal hernia above the diaphragm. 2. There is cardiomegaly with aortic atherosclerosis and chronic interstitial and alveolar airspace disease. 3. Kyphoplasties of the lower thoracic and lumbar spine. Electronically Signed   By: Tollie Eth M.D.   On: 09/04/2016 00:50   US Abdomen Limited Ruq  Result Date: 09/03/2016 CLINICAL DATA:  Abnormal CT.  Cholecystitis. EXAM: ULTRASOUND ABDOMEN LIMITED RIGHT UPPER QUADRANT COMPARISON:  09/03/2016 CT FINDINGS: Gallbladder: Diffuse thickening and distention of the gallbladder with the  gallbladder wall measuring up to 6 mm in thickness. There is biliary sludge and layering calculi along the dependent wall. There is pericholecystic fluid. The patient is ventilated and therefore assessment for a sonographic Eulah Pont sign could not be performed. Common bile duct: Diameter: 4.4 mm Liver: No space-occupying mass of the included liver. No intrahepatic ductal dilatation. Small amount of free fluid is seen adjacent to the edge of the liver. Hepatopetal main portal vein flow on color Doppler imaging. IMPRESSION: Gallbladder distention with mural thickening, pericholecystic fluid, biliary sludge and calculi in keeping with changes of acute cholecystitis. Electronically Signed   By: Tollie Eth M.D.   On: 09/03/2016 21:36    Labs:  CBC:  Recent Labs  12/02/15 03/07/16 1533 09/03/16 1137 09/03/16 1204 09/04/16 0303  WBC 5.5 7.0 8.8  --  21.6*  HGB 14.9 11.0* 12.5 14.3 10.0*  HCT 45 34.0* 38.9 42.0 30.7*  PLT 120* 214 206  --  193    COAGS:  Recent Labs  11/05/15 1032  INR 1.43    BMP:  Recent Labs  11/24/15 0448  03/07/16 1533 09/03/16 1137 09/03/16 1204 09/04/16 0303  NA 140  < > 138 139 141 139  K 3.9  < > 4.1 4.0 4.1 3.7  CL 96*  --  97* 103 101 104  CO2 35*  --  32 27  --  23  GLUCOSE 80  --  82 119* 114* 111*  BUN 14  < > 10 19 25* 15  CALCIUM 9.0  --  8.8* 8.5*  --  7.7*  CREATININE 0.87  < > 0.58 0.83 0.70 0.81  GFRNONAA 60*  --  >60 >60  --  >60  GFRAA >60  --  >60 >60  --  >60  < > = values in this interval not displayed.  LIVER FUNCTION TESTS:  Recent Labs  11/20/15 1219  12/11/15 03/07/16 1533 09/03/16 1137 09/04/16 0303  BILITOT 3.1*  --   --  1.2 4.8* 5.4*  AST 53*  < > 26 26 96* 211*  ALT 45  < > 23 14 93* 146*  ALKPHOS 71  < > 110 78 335* 348*  PROT 7.5  --   --  7.5 8.2* 6.2*  ALBUMIN 3.6  --   --  2.3* 3.1* 2.0*  < > = values in this interval not displayed.  TUMOR MARKERS: No results for input(s): AFPTM, CEA, CA199, CHROMGRNA in  the last 8760 hours.  Assessment and Plan:  Sepsis Cholecystitis Not a surgical candidate per CCS LD Eliquis 8/11 Scheduled for percutaneous cholecystostomy drain placement Risks and benefits discussed with the patient and family including, but not limited to bleeding, infection, gallbladder perforation, bile leak, sepsis or even death. All of the patient's  and family questions were answered, patient and family are agreeable to proceed. Consent signed and in chart.  Thank you for this interesting consult.  I greatly enjoyed meeting KENSLI BOWLEY and look forward to participating in their care.  A copy of this report was sent to the requesting provider on this date.  Electronically Signed: Robet Leu, PA-C 09/04/2016, 10:35 AM   I spent a total of 40 Minutes    in face to face in clinical consultation, greater than 50% of which was counseling/coordinating care for perc chole drain

## 2016-09-04 NOTE — Consult Note (Addendum)
Date of Admission:  09/03/2016          Reason for Consult: Staphylococcus aureus bacteremia  Referring Provider: Terrilyn Saver auto consult and Dr. Ashok Cordia  Assessment: 1. E coli and Klebsiella bacteremia + MSSA bacteremia 2. CT evidence of cholecystitis 3. Dementia 4. Central line in place  Plan: 1. OK to continue zosyn for now but narrow once we have sensis on E coli and Klebiella  2. She is to have IR guided drain placed 3. Execute SAB protocol       [facility] Staphylococcus aureus bacteremia   Staphylococcus aureus bacteremia (SAB) is associated with a high rate of complications and mortality.  Specific aspects of clinical management are critical to optimizing the outcome of patients with SAB.  Therefore, the [facility] Antimicrobial Management Team [facility]) has initiated an intervention aimed at improving the management of SAB at [facility].  To do so, Infectious Diseases physicians are providing an evidence-based consult for the management of all patients with SAB.     Yes No Comments  Perform follow-up blood cultures (even if the patient is afebrile) to ensure clearance of bacteremia '[x]'$  '[]'$    Remove vascular catheter and obtain follow-up blood cultures after the removal of the catheter '[x]'$  '[]'$  WHEN POSSIBLE SHE IS GOING TO NEED CATHETER HOLIDAY FROM HER CENTRAL LINE BUT CURRENTLY ON PRESSORS  Perform echocardiography to evaluate for endocarditis (transthoracic ECHO is 40-50% sensitive, TEE is > 90% sensitive) '[x]'$  '[]'$  Please keep in mind, that neither test can definitively EXCLUDE endocarditis, and that should clinical suspicion remain high for endocarditis the patient should then still be treated with an "endocarditis" duration of therapy = 6 weeks  Consult electrophysiologist to evaluate implanted cardiac device (pacemaker, ICD) '[]'$  '[]'$  NA  Ensure source control '[]'$  '[]'$  Have all abscesses been drained effectively? Have deep seeded infections  (septic joints or osteomyelitis) had appropriate surgical debridement?  Not clear source of MSSA. E coli makes sense with cholecystitis  Investigate for "metastatic" sites of infection '[]'$  '[]'$  Does the patient have ANY symptom or physical exam finding that would suggest a deeper infection (back or neck pain that may be suggestive of vertebral osteomyelitis or epidural abscess, muscle pain that could be a symptom of pyomyositis)?  Keep in mind that for deep seeded infections MRI imaging with contrast is preferred rather than other often insensitive tests such as plain x-rays, especially early in a patient's presentation.  No clear sites  Change antibiotic therapy to zosyn for now, likely narrow to Ceftriaxone +/- metronidazole if able tomorrow '[]'$  '[]'$  Beta-lactam antibiotics are preferred for MSSA due to higher cure rates.   If on Vancomycin, goal trough should be 15 - 20 mcg/mL  Estimated duration of IV antibiotic therapy:  4-6 weeks '[]'$  '[]'$  Consult case management for probably prolonged outpatient IV antibiotic therapy   4. Would engage with palliative care  Active Problems:   Septic shock ([place])   . chlorhexidine gluconate (MEDLINE KIT)  15 mL Mouth Rinse BID  . mouth rinse  15 mL Mouth Rinse QID  . pantoprazole (PROTONIX) IV  40 mg Intravenous QHS    HPI: [name] [name] is a 83 y.o. female who has dementia and frequent falls and poor oral intake who is brought to the emerge department with worsened cognition and a fever of over 103. He was complaining of back pain after recent fall. In the ER she was found to be apneic and  was ultimately intubated. She became hypotensive and required volume resuscitation and was placed on antibiotics.  She has multiple comorbid conditions including atrial fibrillation and congestive heart failure prior alcohol abuse idiopathic pulmonary fibrosis spinal stenosis and vertebral fractures. Is also had multiple surgeries including a total knee arthroplasty.  She  had CT of abdomen and pelvis which was concerning for cholecystitis. She has been seen by general surgery and interventional radiologist plans for placement of percutaneous cholecystostomy.   She self extubated herself last night.  Her admission blood cultures have grown E coli 2/2 and also showing MSSA by "[employer]." I was alerted to her presence via the [facility] system once the methicillin sensitive staphylococcus aureus was identified.  She does have central line in place.     Review of Systems: Review of Systems  Unable to perform ROS: Dementia    Past Medical History:  Diagnosis Date  . Anemia   . Anxiety   . Arthritis   . Atrial fibrillation ([place])   . CHF (congestive heart failure) ([place])   . Dementia   . Depression   . Diverticulosis of colon   . History of alcohol abuse   . Hyperlipidemia   . Hypertension   . IPF (idiopathic pulmonary fibrosis) ([place])   . Irritable bowel syndrome   . OSA (obstructive sleep apnea)   . Osteoporosis   . PMR (polymyalgia rheumatica) ([facility])   . Postherpetic neuralgia   . Spinal stenosis   . Thyroid disease   . Vertebral fracture   . Vitamin D deficiency     Social History  Substance Use Topics  . Smoking status: Former Smoker    Packs/day: 1.00    Years: 20.00    Types: Cigarettes    Quit date: 01/25/1988  . Smokeless tobacco: Never Used  . Alcohol use 0.0 oz/week     Comment: 2 glasses every day    Family History  Problem Relation Age of Onset  . Stroke Father 1  . Heart attack Father   . Coronary artery disease Father 78  . Alcohol abuse Mother   . Breast cancer Sister   . Heart disease Sister   . Colon cancer Neg Hx    Allergies  Allergen Reactions  . Clindamycin Hcl Diarrhea  . Rofecoxib Other (See Comments)    "didn't agree with her" per daughter  . Celecoxib Other (See Comments)    "didn't agree with her" per daughter  . Sertraline Hcl Other (See Comments)    "didn't agree with her" per daughter     OBJECTIVE: Blood pressure 97/66, pulse (!) 119, temperature 98.3 F (36.8 C), temperature source Oral, resp. rate (!) 29, weight 122 lb 5.7 oz (55.5 kg), SpO2 94 %.  Physical Exam  Constitutional: No distress.  HENT:  Head: Normocephalic.  Eyes: Conjunctivae and EOM are normal. Right eye exhibits no discharge. Left eye exhibits no discharge. No scleral icterus.  Neck: Normal range of motion. No tracheal deviation present.  Cardiovascular: Normal rate and regular rhythm.   Pulmonary/Chest: Effort normal. No respiratory distress. She has no wheezes.  Abdominal: Soft. Bowel sounds are normal. She exhibits no distension. There is no tenderness. There is no rebound and no guarding.  Musculoskeletal: Normal range of motion. She exhibits no edema, tenderness or deformity.  Neurological: She is alert.  Skin: She is not diaphoretic.  Psychiatric: Her mood appears not anxious. She does not exhibit a depressed mood. She exhibits abnormal recent memory and abnormal remote memory.  Lab Results Lab Results  Component Value Date   WBC 21.6 (H) 09/04/2016   HGB 10.0 (L) 09/04/2016   HCT 30.7 (L) 09/04/2016   MCV 102.7 (H) 09/04/2016   PLT 193 09/04/2016    Lab Results  Component Value Date   CREATININE 0.81 09/04/2016   BUN 15 09/04/2016   NA 139 09/04/2016   K 3.7 09/04/2016   CL 104 09/04/2016   CO2 23 09/04/2016    Lab Results  Component Value Date   ALT 146 (H) 09/04/2016   AST 211 (H) 09/04/2016   ALKPHOS 348 (H) 09/04/2016   BILITOT 5.4 (H) 09/04/2016     Microbiology: Recent Results (from the past 240 hour(s))  Blood Culture (routine x 2)     Status: None (Preliminary result)   Collection Time: 09/03/16 11:36 AM  Result Value Ref Range Status   Specimen Description BLOOD LEFT FOREARM  Final   Special Requests   Final    BOTTLES DRAWN AEROBIC AND ANAEROBIC Blood Culture adequate volume   Culture  Setup Time   Final    GRAM NEGATIVE RODS IN BOTH AEROBIC AND  ANAEROBIC BOTTLES CRITICAL RESULT CALLED TO, READ BACK BY AND VERIFIED WITH: K COOK 09/04/16 @ 0627 M VESTAL    Culture   Final    GRAM NEGATIVE RODS CULTURE REINCUBATED FOR BETTER GROWTH    Report Status PENDING  Incomplete  Blood Culture ID Panel (Reflexed)     Status: Abnormal   Collection Time: 09/03/16 11:36 AM  Result Value Ref Range Status   Enterococcus species NOT DETECTED NOT DETECTED Final   Vancomycin resistance NOT DETECTED NOT DETECTED Final   Listeria monocytogenes NOT DETECTED NOT DETECTED Final   Staphylococcus species DETECTED (A) NOT DETECTED Final    Comment: CRITICAL RESULT CALLED TO, READ BACK BY AND VERIFIED WITH: K COOK 09/04/16 @ 0627 M VESTAL    Staphylococcus aureus DETECTED (A) NOT DETECTED Final    Comment: CRITICAL RESULT CALLED TO, READ BACK BY AND VERIFIED WITH: K COOK 09/04/16 @ 0627 M VESTAL    Methicillin resistance NOT DETECTED NOT DETECTED Final   Streptococcus species NOT DETECTED NOT DETECTED Final   Streptococcus agalactiae NOT DETECTED NOT DETECTED Final   Streptococcus pneumoniae NOT DETECTED NOT DETECTED Final   Streptococcus pyogenes NOT DETECTED NOT DETECTED Final   Acinetobacter baumannii NOT DETECTED NOT DETECTED Final   Enterobacteriaceae species DETECTED (A) NOT DETECTED Final    Comment: CRITICAL RESULT CALLED TO, READ BACK BY AND VERIFIED WITH: K COOK 09/04/16 @ 0627 M VESTAL    Enterobacter cloacae complex NOT DETECTED NOT DETECTED Final   Escherichia coli DETECTED (A) NOT DETECTED Final    Comment: CRITICAL RESULT CALLED TO, READ BACK BY AND VERIFIED WITH: K COOK 09/04/16 @ 0627 M VESTAL    Klebsiella oxytoca NOT DETECTED NOT DETECTED Final   Klebsiella pneumoniae DETECTED (A) NOT DETECTED Final    Comment: CRITICAL RESULT CALLED TO, READ BACK BY AND VERIFIED WITH: K COOK 09/04/16 @ 0627 M VESTAL    Proteus species NOT DETECTED NOT DETECTED Final   Serratia marcescens NOT DETECTED NOT DETECTED Final   Carbapenem resistance NOT  DETECTED NOT DETECTED Final   Haemophilus influenzae NOT DETECTED NOT DETECTED Final   Neisseria meningitidis NOT DETECTED NOT DETECTED Final   Pseudomonas aeruginosa NOT DETECTED NOT DETECTED Final   Candida albicans NOT DETECTED NOT DETECTED Final   Candida glabrata NOT DETECTED NOT DETECTED Final   Candida krusei NOT DETECTED  NOT DETECTED Final   Candida parapsilosis NOT DETECTED NOT DETECTED Final   Candida tropicalis NOT DETECTED NOT DETECTED Final  Blood Culture (routine x 2)     Status: None (Preliminary result)   Collection Time: 09/03/16 11:45 AM  Result Value Ref Range Status   Specimen Description BLOOD LEFT FOREARM  Final   Special Requests   Final    IN PEDIATRIC BOTTLE Blood Culture results may not be optimal due to an excessive volume of blood received in culture bottles   Culture  Setup Time   Final    GRAM NEGATIVE RODS IN PEDIATRIC BOTTLE CRITICAL RESULT CALLED TO, READ BACK BY AND VERIFIED WITH: K COOK 09/04/16 @ 0627 M VESTAL    Culture   Final    GRAM NEGATIVE RODS CULTURE REINCUBATED FOR BETTER GROWTH    Report Status PENDING  Incomplete  Urine culture     Status: None   Collection Time: 09/03/16 12:27 PM  Result Value Ref Range Status   Specimen Description URINE, RANDOM  Final   Special Requests NONE  Final   Culture NO GROWTH  Final   Report Status 09/04/2016 FINAL  Final  MRSA PCR Screening     Status: None   Collection Time: 09/03/16  4:42 PM  Result Value Ref Range Status   MRSA by PCR NEGATIVE NEGATIVE Final    Comment:        The GeneXpert MRSA Assay (FDA approved for NASAL specimens only), is one component of a comprehensive MRSA colonization surveillance program. It is not intended to diagnose MRSA infection nor to guide or monitor treatment for MRSA infections.     Alcide Evener, [facility] [phone] pager    09/04/2016, 2:14 PM

## 2016-09-05 ENCOUNTER — Encounter (HOSPITAL_COMMUNITY): Payer: Self-pay | Admitting: Diagnostic Radiology

## 2016-09-05 ENCOUNTER — Inpatient Hospital Stay (HOSPITAL_COMMUNITY): Payer: Medicare Other

## 2016-09-05 DIAGNOSIS — J9601 Acute respiratory failure with hypoxia: Secondary | ICD-10-CM

## 2016-09-05 DIAGNOSIS — R402433 Glasgow coma scale score 3-8, at hospital admission: Secondary | ICD-10-CM

## 2016-09-05 DIAGNOSIS — A419 Sepsis, unspecified organism: Secondary | ICD-10-CM

## 2016-09-05 DIAGNOSIS — T80219D Unspecified infection due to central venous catheter, subsequent encounter: Secondary | ICD-10-CM

## 2016-09-05 HISTORY — PX: IR PERC CHOLECYSTOSTOMY: IMG2326

## 2016-09-05 LAB — CBC WITH DIFFERENTIAL/PLATELET
BASOS ABS: 0 10*3/uL (ref 0.0–0.1)
Basophils Relative: 0 %
EOS PCT: 1 %
Eosinophils Absolute: 0.1 10*3/uL (ref 0.0–0.7)
HCT: 27.3 % — ABNORMAL LOW (ref 36.0–46.0)
HEMOGLOBIN: 8.8 g/dL — AB (ref 12.0–15.0)
LYMPHS ABS: 1.3 10*3/uL (ref 0.7–4.0)
LYMPHS PCT: 9 %
MCH: 32.7 pg (ref 26.0–34.0)
MCHC: 32.2 g/dL (ref 30.0–36.0)
MCV: 101.5 fL — AB (ref 78.0–100.0)
Monocytes Absolute: 0.9 10*3/uL (ref 0.1–1.0)
Monocytes Relative: 6 %
NEUTROS PCT: 84 %
Neutro Abs: 12.1 10*3/uL — ABNORMAL HIGH (ref 1.7–7.7)
PLATELETS: 147 10*3/uL — AB (ref 150–400)
RBC: 2.69 MIL/uL — AB (ref 3.87–5.11)
RDW: 20.3 % — ABNORMAL HIGH (ref 11.5–15.5)
WBC: 14.5 10*3/uL — AB (ref 4.0–10.5)

## 2016-09-05 LAB — COMPREHENSIVE METABOLIC PANEL
ALBUMIN: 2 g/dL — AB (ref 3.5–5.0)
ALT: 85 U/L — ABNORMAL HIGH (ref 14–54)
ANION GAP: 7 (ref 5–15)
AST: 80 U/L — ABNORMAL HIGH (ref 15–41)
Alkaline Phosphatase: 254 U/L — ABNORMAL HIGH (ref 38–126)
BUN: 10 mg/dL (ref 6–20)
CALCIUM: 7.9 mg/dL — AB (ref 8.9–10.3)
CHLORIDE: 106 mmol/L (ref 101–111)
CO2: 27 mmol/L (ref 22–32)
Creatinine, Ser: 0.66 mg/dL (ref 0.44–1.00)
GFR calc non Af Amer: >60 mL/min (ref >60)
GLUCOSE: 79 mg/dL (ref 65–99)
POTASSIUM: 3 mmol/L — AB (ref 3.5–5.1)
SODIUM: 140 mmol/L (ref 135–145)
Total Bilirubin: 2.6 mg/dL — ABNORMAL HIGH (ref 0.3–1.2)
Total Protein: 5.3 g/dL — ABNORMAL LOW (ref 6.5–8.1)

## 2016-09-05 LAB — PHOSPHORUS: PHOSPHORUS: 2.2 mg/dL — AB (ref 2.5–4.6)

## 2016-09-05 LAB — MAGNESIUM: Magnesium: 2.2 mg/dL (ref 1.7–2.4)

## 2016-09-05 LAB — GLUCOSE, CAPILLARY
GLUCOSE-CAPILLARY: 73 mg/dL (ref 65–99)
Glucose-Capillary: 80 mg/dL (ref 65–99)

## 2016-09-05 LAB — PROCALCITONIN: PROCALCITONIN: 4.27 ng/mL

## 2016-09-05 LAB — PROTIME-INR
INR: 1.97
Prothrombin Time: 22.7 seconds — ABNORMAL HIGH (ref 11.4–15.2)

## 2016-09-05 MED ORDER — FENTANYL CITRATE (PF) 100 MCG/2ML IJ SOLN
INTRAMUSCULAR | Status: AC | PRN
  Administered 2016-09-05: 25 ug via INTRAVENOUS

## 2016-09-05 MED ORDER — SODIUM CHLORIDE 0.9 % IV SOLN: Freq: Once | INTRAVENOUS | Status: DC

## 2016-09-05 MED ORDER — IOPAMIDOL (ISOVUE-300) INJECTION 61%
INTRAVENOUS | Status: AC
  Administered 2016-09-05: 5 mL
  Filled 2016-09-05: qty 50

## 2016-09-05 MED ORDER — FENTANYL CITRATE (PF) 100 MCG/2ML IJ SOLN
INTRAMUSCULAR | Status: AC
  Filled 2016-09-05: qty 2

## 2016-09-05 MED ORDER — LIDOCAINE HCL (PF) 1 % IJ SOLN
INTRAMUSCULAR | Status: AC
  Filled 2016-09-05: qty 30

## 2016-09-05 MED ORDER — MIDAZOLAM HCL 2 MG/2ML IJ SOLN
INTRAMUSCULAR | Status: AC
  Filled 2016-09-05: qty 2

## 2016-09-05 MED ORDER — POTASSIUM CHLORIDE 10 MEQ/50ML IV SOLN
10.0000 meq | INTRAVENOUS | Status: AC
  Administered 2016-09-05 (×4): 10 meq via INTRAVENOUS
  Filled 2016-09-05 (×4): qty 50

## 2016-09-05 MED ORDER — SODIUM CHLORIDE 0.9 % IV SOLN
INTRAVENOUS | Status: DC
  Administered 2016-09-05 – 2016-09-07 (×2): via INTRAVENOUS

## 2016-09-05 MED ORDER — POTASSIUM PHOSPHATES 15 MMOLE/5ML IV SOLN
20.0000 meq | Freq: Once | INTRAVENOUS | Status: AC
  Administered 2016-09-05: 20 meq via INTRAVENOUS
  Filled 2016-09-05: qty 4.55

## 2016-09-05 MED ORDER — LIDOCAINE HCL (PF) 1 % IJ SOLN
INTRAMUSCULAR | Status: AC | PRN
  Administered 2016-09-05: 10 mL

## 2016-09-05 NOTE — Sedation Documentation (Signed)
Patient is resting comfortably. 

## 2016-09-05 NOTE — Progress Notes (Signed)
eLink Physician-Brief Progress Note Patient Name: Kirstie MirzaGloria G Cacioppo DOB: 04/26/1930 MRN: [id number]   Date of Service  09/05/2016  HPI/Events of Note    eICU Interventions  Hypokalemia [place]/hypophos -repleted      Intervention Category Intermediate Interventions: Electrolyte abnormality - evaluation and management  ALVA,RAKESH V. 09/05/2016, 5:16 AM

## 2016-09-05 NOTE — Sedation Documentation (Signed)
Patient denies pain and is resting comfortably.  

## 2016-09-05 NOTE — Procedures (Signed)
Placement of percutaneous cholecystostomy with fluoro and US guidance.  Removed 50 ml of brown fluid.  Minimal blood loss and no immediate complication.

## 2016-09-05 NOTE — Progress Notes (Signed)
Subjective: Somnolent   Antibiotics:  Anti-infectives    Start     Dose/Rate Route Frequency Ordered Stop   09/04/16 1200  vancomycin (VANCOCIN) IVPB 1000 mg/200 mL premix  Status:  Discontinued     1,000 mg 200 mL/hr over 60 Minutes Intravenous Every 24 hours 09/03/16 1443 09/04/16 0828   09/03/16 2200  piperacillin-tazobactam (ZOSYN) IVPB 3.375 g  Status:  Discontinued     3.375 g 100 mL/hr over 30 Minutes Intravenous Every 8 hours 09/03/16 1858 09/03/16 1901   09/03/16 2200  piperacillin-tazobactam (ZOSYN) IVPB 3.375 g     3.375 g 12.5 mL/hr over 240 Minutes Intravenous Every 8 hours 09/03/16 1902     09/03/16 1600  metroNIDAZOLE (FLAGYL) IVPB 500 mg  Status:  Discontinued     500 mg 100 mL/hr over 60 Minutes Intravenous Every 8 hours 09/03/16 1418 09/03/16 1858   09/03/16 1500  cefTRIAXone (ROCEPHIN) 2 g in dextrose 5 % 50 mL IVPB  Status:  Discontinued     2 g 100 mL/hr over 30 Minutes Intravenous Every 12 hours 09/03/16 1418 09/03/16 1858   09/03/16 1430  vancomycin (VANCOCIN) 500 mg in sodium chloride 0.9 % 100 mL IVPB  Status:  Discontinued     500 mg 100 mL/hr over 60 Minutes Intravenous Every 12 hours 09/03/16 1418 09/03/16 1443   09/03/16 1145  vancomycin (VANCOCIN) IVPB 1000 mg/200 mL premix     1,000 mg 200 mL/hr over 60 Minutes Intravenous  Once 09/03/16 1134 09/03/16 1330   09/03/16 1145  piperacillin-tazobactam (ZOSYN) IVPB 3.375 g     3.375 g 100 mL/hr over 30 Minutes Intravenous  Once 09/03/16 1134 09/03/16 1239      Medications: Scheduled Meds: . chlorhexidine gluconate (MEDLINE KIT)  15 mL Mouth Rinse BID  . mouth rinse  15 mL Mouth Rinse QID  . pantoprazole (PROTONIX) IV  40 mg Intravenous QHS   Continuous Infusions: . sodium chloride    . sodium chloride 50 mL/hr at 09/05/16 0955  . dextrose 10 mL/hr at 09/05/16 0800  . phenylephrine (NEO-SYNEPHRINE) Adult infusion 10 mcg/min (09/05/16 1137)  . piperacillin-tazobactam (ZOSYN)  IV Stopped  (09/05/16 0956)  . potassium chloride 10 mEq (09/05/16 0954)  . potassium PHOSPHATE IVPB (mEq) 20 mEq (09/05/16 0626)   PRN Meds:.sodium chloride    Objective: Weight change: 3 lb 12 oz (1.7 kg)  Intake/Output Summary (Last 24 hours) at 09/05/16 1200 Last data filed at 09/05/16 1157  Gross per 24 hour  Intake          2981.84 ml  Output              510 ml  Net          2471.84 ml   Blood pressure 115/82, pulse (!) 106, temperature (!) 97.5 F (36.4 C), temperature source Axillary, resp. rate (!) 26, weight 126 lb 8.7 oz (57.4 kg), SpO2 100 %. Temp:  [97.5 F (36.4 C)-98.9 F (37.2 C)] 97.5 F (36.4 C) (08/13 1157) Pulse Rate:  [64-157] 106 (08/13 1157) Resp:  [13-33] 26 (08/13 1157) BP: (80-118)/(40-86) 115/82 (08/13 1130) SpO2:  [89 %-100 %] 100 % (08/13 1157) Weight:  [126 lb 8.7 oz (57.4 kg)] 126 lb 8.7 oz (57.4 kg) (08/13 0408)  Physical Exam: General:She was sleeping when I came to visit her but awoke and answered some questions she is still confused  HEENT:  EOMI CVS tachycardic rate, normal r,  no murmur rubs  or gallops Chest: no wheezing, Abdomen: soft nontender, nondistended, normal bowel sounds, Extremities: no  clubbing or edema noted bilaterally Skin: She has cut on her chin Neuro: nonfocal  CBC: CBC Latest Ref Rng & Units 09/05/2016 09/04/2016 09/03/2016  WBC 4.0 - 10.5 K/uL 14.5(H) 21.6(H) -  Hemoglobin 12.0 - 15.0 g/dL 8.8(L) 10.0(L) 14.3  Hematocrit 36.0 - 46.0 % 27.3(L) 30.7(L) 42.0  Platelets 150 - 400 K/uL 147(L) 193 -      BMET  Recent Labs  09/04/16 0303 09/05/16 0352  NA 139 140  K 3.7 3.0*  CL 104 106  CO2 23 27  GLUCOSE 111* 79  BUN 15 10  CREATININE 0.81 0.66  CALCIUM 7.7* 7.9*     Liver Panel   Recent Labs  09/04/16 0303 09/05/16 0352  PROT 6.2* 5.3*  ALBUMIN 2.0* 2.0*  AST 211* 80*  ALT 146* 85*  ALKPHOS 348* 254*  BILITOT 5.4* 2.6*       Sedimentation Rate No results for input(s): ESRSEDRATE in the last 72  hours. C-Reactive Protein No results for input(s): CRP in the last 72 hours.  Micro Results: Recent Results (from the past 720 hour(s))  Blood Culture (routine x 2)     Status: None (Preliminary result)   Collection Time: 09/03/16 11:36 AM  Result Value Ref Range Status   Specimen Description BLOOD LEFT FOREARM  Final   Special Requests   Final    BOTTLES DRAWN AEROBIC AND ANAEROBIC Blood Culture adequate volume   Culture  Setup Time   Final    GRAM NEGATIVE RODS IN BOTH AEROBIC AND ANAEROBIC BOTTLES CRITICAL RESULT CALLED TO, READ BACK BY AND VERIFIED WITH: K COOK 09/04/16 @ 0627 M VESTAL    Culture   Final    GRAM NEGATIVE RODS IDENTIFICATION AND SUSCEPTIBILITIES TO FOLLOW    Report Status PENDING  Incomplete  Blood Culture ID Panel (Reflexed)     Status: Abnormal   Collection Time: 09/03/16 11:36 AM  Result Value Ref Range Status   Enterococcus species NOT DETECTED NOT DETECTED Final   Vancomycin resistance NOT DETECTED NOT DETECTED Final   Listeria monocytogenes NOT DETECTED NOT DETECTED Final   Staphylococcus species DETECTED (A) NOT DETECTED Final    Comment: CRITICAL RESULT CALLED TO, READ BACK BY AND VERIFIED WITH: K COOK 09/04/16 @ 0627 M VESTAL    Staphylococcus aureus DETECTED (A) NOT DETECTED Final    Comment: CRITICAL RESULT CALLED TO, READ BACK BY AND VERIFIED WITH: K COOK 09/04/16 @ 0627 M VESTAL    Methicillin resistance NOT DETECTED NOT DETECTED Final   Streptococcus species NOT DETECTED NOT DETECTED Final   Streptococcus agalactiae NOT DETECTED NOT DETECTED Final   Streptococcus pneumoniae NOT DETECTED NOT DETECTED Final   Streptococcus pyogenes NOT DETECTED NOT DETECTED Final   Acinetobacter baumannii NOT DETECTED NOT DETECTED Final   Enterobacteriaceae species DETECTED (A) NOT DETECTED Final    Comment: CRITICAL RESULT CALLED TO, READ BACK BY AND VERIFIED WITH: K COOK 09/04/16 @ 0627 M VESTAL    Enterobacter cloacae complex NOT DETECTED NOT DETECTED Final    Escherichia coli DETECTED (A) NOT DETECTED Final    Comment: CRITICAL RESULT CALLED TO, READ BACK BY AND VERIFIED WITH: K COOK 09/04/16 @ 1610 M VESTAL    Klebsiella oxytoca NOT DETECTED NOT DETECTED Final   Klebsiella pneumoniae DETECTED (A) NOT DETECTED Final    Comment: CRITICAL RESULT CALLED TO, READ BACK BY AND VERIFIED WITH: K COOK 09/04/16 @ 9604 Jerilynn Mages  VESTAL    Proteus species NOT DETECTED NOT DETECTED Final   Serratia marcescens NOT DETECTED NOT DETECTED Final   Carbapenem resistance NOT DETECTED NOT DETECTED Final   Haemophilus influenzae NOT DETECTED NOT DETECTED Final   Neisseria meningitidis NOT DETECTED NOT DETECTED Final   Pseudomonas aeruginosa NOT DETECTED NOT DETECTED Final   Candida albicans NOT DETECTED NOT DETECTED Final   Candida glabrata NOT DETECTED NOT DETECTED Final   Candida krusei NOT DETECTED NOT DETECTED Final   Candida parapsilosis NOT DETECTED NOT DETECTED Final   Candida tropicalis NOT DETECTED NOT DETECTED Final  Blood Culture (routine x 2)     Status: None (Preliminary result)   Collection Time: 09/03/16 11:45 AM  Result Value Ref Range Status   Specimen Description BLOOD LEFT FOREARM  Final   Special Requests   Final    IN PEDIATRIC BOTTLE Blood Culture results may not be optimal due to an excessive volume of blood received in culture bottles   Culture  Setup Time   Final    GRAM NEGATIVE RODS IN PEDIATRIC BOTTLE CRITICAL RESULT CALLED TO, READ BACK BY AND VERIFIED WITH: K COOK 09/04/16 @ 0627 M VESTAL    Culture   Final    GRAM NEGATIVE RODS IDENTIFICATION AND SUSCEPTIBILITIES TO FOLLOW    Report Status PENDING  Incomplete  Urine culture     Status: None   Collection Time: 09/03/16 12:27 PM  Result Value Ref Range Status   Specimen Description URINE, RANDOM  Final   Special Requests NONE  Final   Culture NO GROWTH  Final   Report Status 09/04/2016 FINAL  Final  MRSA PCR Screening     Status: None   Collection Time: 09/03/16  4:42 PM    Result Value Ref Range Status   MRSA by PCR NEGATIVE NEGATIVE Final    Comment:        The GeneXpert MRSA Assay (FDA approved for NASAL specimens only), is one component of a comprehensive MRSA colonization surveillance program. It is not intended to diagnose MRSA infection nor to guide or monitor treatment for MRSA infections.     Studies/Results: Ct Head Wo Contrast  Result Date: 09/03/2016 CLINICAL DATA:  Found down at nursing home, respiratory distress, altered level of consciousness, history hypertension, dementia, CHF, pulmonary fibrosis EXAM: CT HEAD WITHOUT CONTRAST TECHNIQUE: Contiguous axial images were obtained from the base of the skull through the vertex without intravenous contrast. Sagittal and coronal MPR images reconstructed from axial data set. COMPARISON:  03/07/2016 FINDINGS: Brain: Generalized atrophy. Normal ventricular morphology. No midline shift or mass effect. Minimal small vessel chronic ischemic changes of deep cerebral white matter. No intracranial hemorrhage, mass lesion, evidence of acute infarction, or extra-axial fluid collection. Vascular: Atherosclerotic calcification of internal carotid arteries at skullbase Skull: Intact Sinuses/Orbits: Clear.  Dependent fluid in the nasopharynx Other: N/A IMPRESSION: Prominent atrophy with minimal small vessel chronic ischemic changes of deep cerebral white matter. No acute intracranial abnormalities. Electronically Signed   By: Ulyses Southward M.D.   On: 09/03/2016 16:45   Ct Abdomen Pelvis W Contrast  Result Date: 09/03/2016 CLINICAL DATA:  Abdominal pain.  Respiratory distress. EXAM: CT ABDOMEN AND PELVIS WITH CONTRAST TECHNIQUE: Multidetector CT imaging of the abdomen and pelvis was performed using the standard protocol following bolus administration of intravenous contrast. CONTRAST:  76mL ISOVUE-300 IOPAMIDOL (ISOVUE-300) INJECTION 61% COMPARISON:  Chest CT dated 02/07/2014. Right upper quadrant abdomen ultrasound dated  11/20/2015. FINDINGS: Lower chest: Again demonstrated are fibrotic changes  with peripheral honeycombing at both lung bases. There is interval increased density in the posterior aspects of both lower lobes, greater on the right. No pleural fluid seen. Moderately large hiatal hernia. Enlarged heart. Small pericardial effusion with a maximum thickness of 11 mm. Extensive atheromatous coronary artery calcifications. Hepatobiliary: Interval diffuse low density of the liver relative to the spleen. There is a linear branching area of increased enhancement in the right lobe of the liver on the initial images, not seen on the delayed images. There is extensive pericholecystic fluid and soft tissue stranding. Also demonstrated is marked dilatation of the cystic duct with mucosal enhancement and dilatation of the gallbladder with mucosal enhancement. No calcified gallstones visualized. No gallstones visualized on the previous ultrasound. Pancreas: Pancreatic atrophy. Spleen: Normal in size without focal abnormality. Adrenals/Urinary Tract: Adrenal glands are unremarkable. Kidneys are normal, without renal calculi, focal lesion, or hydronephrosis. Bladder is unremarkable. Stomach/Bowel: Moderately large hiatal hernia. No small bowel or colonic abnormalities demonstrated. No evidence of appendicitis, surgically absent by history. Vascular/Lymphatic: Arterial calcifications, including dense coronary artery calcifications. No aneurysm or enlarged lymph nodes. Reproductive: Small uterus.  No adnexal masses. Other: Small amount of free peritoneal fluid. No abdominal wall hernia seen. Musculoskeletal: Multiple lumbar and lower thoracic vertebral compression deformities, some with kyphoplasty material. No acute fracture lines seen. Mild bilateral hip degenerative changes. IMPRESSION: 1. Findings compatible with cystic duct obstruction with marked changes of acute cholecystitis. A right upper quadrant abdomen ultrasound may provide  useful additional information. 2. Small amount of free peritoneal fluid. 3. Diffuse hepatic steatosis and transient enhancement phenomenon in the right lobe. 4. Interval atelectasis or pneumonia in both lower lobes, greater on the right with stable underlying interstitial fibrosis and peripheral honeycombing. 5. Extensive dense calcified coronary artery atherosclerosis. 6. Moderately large hiatal hernia. Electronically Signed   By: Claudie Revering M.D.   On: 09/03/2016 17:10   Dg Chest Port 1 View  Result Date: 09/03/2016 CLINICAL DATA:  83 year old female under evaluation for are central line placement. EXAM: PORTABLE CHEST 1 VIEW COMPARISON:  Chest x-ray 09/03/2016. FINDINGS: New left upper extremity PICC with tip terminating in the distal superior vena cava. An endotracheal tube is in place with tip 5.1 cm above the carina. Lung volumes are low. Diffuse interstitial prominence throughout the lungs bilaterally, most evident throughout the mid to lower lungs, compatible with underlying interstitial lung disease. No definite acute consolidative airspace disease. Lateral pleural fluid or pleural thickening noted in the right mid hemithorax. No definite left pleural effusion. No evidence of pulmonary edema. Heart size is borderline enlarged. Large hiatal hernia. Upper mediastinal contours are distorted by patient positioning. Aortic atherosclerosis. IMPRESSION: 1. Support apparatus, as above. 2. Chronic changes of interstitial lung disease, similar to prior studies, presumably from underlying usual interstitial pneumonia (UIP). 3. Lateral pleural thickening or lateral loculated pleural fluid in the right mid hemithorax. 4. Aortic atherosclerosis. 5. Large hiatal hernia. Electronically Signed   By: Vinnie Langton M.D.   On: 09/03/2016 19:29   Dg Chest Portable 1 View  Result Date: 09/03/2016 CLINICAL DATA:  Intubation, hypertension, CHF, idiopathic pulmonary fibrosis, atrial fibrillation EXAM: PORTABLE CHEST 1  VIEW COMPARISON:  Portable exam 1312 hours compared to 1138 hours FINDINGS: Tip of endotracheal tube projects 3.2 cm above carina. Stable heart size. Slightly rotated to the RIGHT which may account for prominence of the RIGHT hilum. Diffuse interstitial infiltrates compatible pulmonary fibrosis. No definite superimposed acute infiltrate, pleural effusion or pneumothorax. Bones demineralized. IMPRESSION: Pulmonary fibrosis.  Endotracheal tube tip projects 3.2 cm above carina. Electronically Signed   By: Lavonia Dana M.D.   On: 09/03/2016 13:39   Dg Abd Portable 1v  Result Date: 09/04/2016 CLINICAL DATA:  Orogastric tube placement EXAM: PORTABLE ABDOMEN - 1 VIEW COMPARISON:  Same day CT abdomen and pelvis FINDINGS: The tip and side ports in a gastric tube are seen below the left hemidiaphragm. There appears to be a large hiatal hernia causing leftward deviation of the gastric tube as it courses over the lower mediastinum. The heart is enlarged. There is aortic atherosclerosis with slight uncoiling of the thoracic aorta. Interstitial and alveolar airspace disease is noted at the lung bases left greater than right. The patient is status post the level kyphoplasty and what appears to be T12, L3 and L4. Numerous injection granulomas project over the buttocks bilaterally. IMPRESSION: 1. The tip and side port of a gastric tube are seen just below the left hemidiaphragm presumably in the stomach. There is a large hiatal hernia above the diaphragm. 2. There is cardiomegaly with aortic atherosclerosis and chronic interstitial and alveolar airspace disease. 3. Kyphoplasties of the lower thoracic and lumbar spine. Electronically Signed   By: Ashley Royalty M.D.   On: 09/04/2016 00:50   US Abdomen Limited Ruq  Result Date: 09/03/2016 CLINICAL DATA:  Abnormal CT.  Cholecystitis. EXAM: ULTRASOUND ABDOMEN LIMITED RIGHT UPPER QUADRANT COMPARISON:  09/03/2016 CT FINDINGS: Gallbladder: Diffuse thickening and distention of the  gallbladder with the gallbladder wall measuring up to 6 mm in thickness. There is biliary sludge and layering calculi along the dependent wall. There is pericholecystic fluid. The patient is ventilated and therefore assessment for a sonographic Percell Miller sign could not be performed. Common bile duct: Diameter: 4.4 mm Liver: No space-occupying mass of the included liver. No intrahepatic ductal dilatation. Small amount of free fluid is seen adjacent to the edge of the liver. Hepatopetal main portal vein flow on color Doppler imaging. IMPRESSION: Gallbladder distention with mural thickening, pericholecystic fluid, biliary sludge and calculi in keeping with changes of acute cholecystitis. Electronically Signed   By: Ashley Royalty M.D.   On: 09/03/2016 21:36      Assessment/Plan:  INTERVAL HISTORY: pt still on pressors   Active Problems:   Septic shock (HCC)   Acute respiratory failure with hypoxia (HCC)   Glasgow coma scale total score 3-8 (HCC)   Bacteremia due to Escherichia coli   Staphylococcus aureus bacteremia with sepsis ([place])   Cholecystitis   Central line infection    [name] [name] is a 83 y.o. female with  with dementia and multiple medical problems admitted with sepsis found to have evidence of cholecystitis by CT scan also with positive blood cultures with BCID ID Klebsiella Escherichia coli and methicillin sensitive Staphylococcus aureus  #1 cholecystitis with polymicrobial bacteremia: The Klebsiella and Escherichia coli make sense to be pathogens with a cholecystitis but the methicillin sensitive staph aureus does not make sense in a unifying diagnosis. All these organisms are covered by the Zosyn but hopefully we can simplify since we really don't need anti-pseudomonal coverage. I'm hoping sensitivities on the gram-negative rods wound VAC today  My understanding is also she is to have a drain placed  she still remains on pressors  #2      [place] [facility] Team  Staphylococcus aureus bacteremia   Staphylococcus aureus bacteremia (SAB) is associated with a high rate of complications and mortality.  Specific aspects of clinical management are critical to optimizing the  outcome of patients with SAB.  Therefore, the [facility] Antimicrobial Management Team [facility]) has initiated an intervention aimed at improving the management of SAB at [facility].  To do so, Infectious Diseases physicians are providing an evidence-based consult for the management of all patients with SAB.     Yes No Comments  Perform follow-up blood cultures (even if the patient is afebrile) to ensure clearance of bacteremia '[x]'$  '[]'$  I ORDERED THEM BUT NOT DONE BY PHLEBOTOMY?  REORDERING and will need them again after central line out  Remove vascular catheter and obtain follow-up blood cultures after the removal of the catheter '[x]'$  '[]'$  She still needs her central line for pressors but eventually if she survives she will need a catheter holiday   Perform echocardiography to evaluate for endocarditis (transthoracic ECHO is 40-50% sensitive, TEE is > 90% sensitive) '[]'$  '[]'$  Please keep in mind, that neither test can definitively EXCLUDE endocarditis, and that should clinical suspicion remain high for endocarditis the patient should then still be treated with an "endocarditis" duration of therapy = 6 weeks  TTE shows significant valvular pathology but not mentioning endocarditis I would not subject her to a transesophageal echocardiogram   Consult electrophysiologist to evaluate implanted cardiac device (pacemaker, ICD) '[]'$  '[]'$    Ensure source control '[x]'$  '[]'$  Have all abscesses been drained effectively? Have deep seeded infections (septic joints or osteomyelitis) had appropriate surgical debridement?  Unclear source for MSSA.   Investigate for "metastatic" sites of infection '[]'$  '[]'$  Does the patient have ANY symptom or physical exam finding that would suggest a deeper infection (back or neck pain that may  be suggestive of vertebral osteomyelitis or epidural abscess, muscle pain that could be a symptom of pyomyositis)?  Keep in mind that for deep seeded infections MRI imaging with contrast is preferred rather than other often insensitive tests such as plain x-rays, especially early in a patient's presentation. no clear metastatic site   Change antibiotic therapy to Zosyn for now ending susceptibilities on the gram negatives  '[]'$  '[]'$  Beta-lactam antibiotics are preferred for MSSA due to higher cure rates.   If on Vancomycin, goal trough should be 15 - 20 mcg/mL  Estimated duration of IV antibiotic therapy:  6 weeks  '[]'$  '[]'$  Consult case management for probably prolonged outpatient IV antibiotic therapy       LOS: 2 days   Alcide Evener 09/05/2016, 12:00 PM

## 2016-09-05 NOTE — Progress Notes (Signed)
PULMONARY / CRITICAL CARE MEDICINE   Name: [name] [name] MRN: [id number] DOB: May 06, 1930    ADMISSION DATE:  09/03/2016  CHIEF COMPLAINT:  Fever and Altered Mental status  HISTORY OF PRESENT ILLNESS:  83 y.o. female who at baseline suffers from moderate dementia manifested as memory loss, frequent falls and indifference to food. She was brought to the due to decreased mentation and was found to have a rectal temperature greater than 103. She has reportedly has not had cough dyspnea or abdominal pain. She has been incontinent of urine, and has been having back pain since a recent fall. In the ER she was felt to be apneic and was intubated. She has become hypotensive and received 3 liters of crystalloid, she remains hypotensive and low dose levophed has been added via peripheral IV.  SUBJECTIVE:  Remains on pressors   VITAL SIGNS: BP (!) 107/57   Pulse 86   Temp 98.2 F (36.8 C) (Axillary)   Resp 19   Wt 57.4 kg (126 lb 8.7 oz)   SpO2 99%   BMI 19.82 kg/m   HEMODYNAMICS: CVP:  [0 mmHg-11 mmHg] 11 mmHg  VENTILATOR SETTINGS:    INTAKE / OUTPUT: I/O last 3 completed shifts: In: 4628.1 [I.V.:4428.1; IV Piggyback:200] Out: 965 [Urine:965]  PHYSICAL EXAMINATION: General: off vent, no distress Neuro: A o x 2, nonfocal HEENT: jvd wnl or low PULM: CTA CV: s1 s2 rrr no m GI: soft, BS wnl, no r/g, no tender RUQ Extremities: edema none   LABS:  BMET  Recent Labs Lab 09/03/16 1137 09/03/16 1204 09/04/16 0303 09/05/16 0352  NA 139 141 139 140  K 4.0 4.1 3.7 3.0*  CL 103 101 104 106  CO2 27  --  23 27  BUN 19 25* 15 10  CREATININE 0.83 0.70 0.81 0.66  GLUCOSE 119* 114* 111* 79    Electrolytes  Recent Labs Lab 09/03/16 1137 09/04/16 0303 09/04/16 1100 09/05/16 0352  CALCIUM 8.5* 7.7*  --  7.9*  MG  --   --  1.7 2.2  PHOS  --   --   --  2.2*    CBC  Recent Labs Lab 09/03/16 1137 09/03/16 1204 09/04/16 0303 09/05/16 0352  WBC 8.8  --  21.6* 14.5*  HGB  12.5 14.3 10.0* 8.8*  HCT 38.9 42.0 30.7* 27.3*  PLT 206  --  193 147*    Coag's  Recent Labs Lab 09/04/16 1100 09/05/16 0754  APTT 41*  --   INR 2.67 1.97    Sepsis Markers  Recent Labs Lab 09/03/16 1502 09/03/16 1535 09/03/16 1936 09/04/16 1100 09/05/16 0352  LATICACIDVEN 2.68* 2.2* 1.9  --   --   PROCALCITON  --  5.97  --  6.76 4.27    ABG  Recent Labs Lab 09/03/16 1431 09/04/16 0408  PHART 7.380 7.383  PCO2ART 49.6* 45.1  PO2ART 446.0* 163*    Liver Enzymes  Recent Labs Lab 09/03/16 1137 09/04/16 0303 09/05/16 0352  AST 96* 211* 80*  ALT 93* 146* 85*  ALKPHOS 335* 348* 254*  BILITOT 4.8* 5.4* 2.6*  ALBUMIN 3.1* 2.0* 2.0*    Cardiac Enzymes  Recent Labs Lab 09/03/16 1535 09/03/16 1936 09/04/16 0303  TROPONINI 0.06* 0.05* 0.04*    Glucose  Recent Labs Lab 09/04/16 1213 09/04/16 1301 09/04/16 1629 09/04/16 2024 09/05/16 0045 09/05/16 0411  GLUCAP 66 109* 89 77 80 73    Imaging No results found.  IMAGING/STUDIES: CT HEAD W/O 8/11:  Prominent atrophy  with minimal small vessel chronic ischemic changes of deep cerebral white matter. No acute intracranial abnormalities. CT ABDOMEN/PELVIS 8/11: IMPRESSION: 1. Findings compatible with cystic duct obstruction with marked changes of acute cholecystitis. A right upper quadrant abdomen ultrasound may provide useful additional information. 2. Small amount of free peritoneal fluid. 3. Diffuse hepatic steatosis and transient enhancement phenomenon in the right lobe. 4. Interval atelectasis or pneumonia in both lower lobes, greater on the right with stable underlying interstitial fibrosis and peripheral honeycombing. 5. Extensive dense calcified coronary artery atherosclerosis. 6. Moderately large hiatal hernia. RUQ U/S 8/11:  Gallbladder distention with mural thickening, pericholecystic fluid, biliary sludge and calculi in keeping with changes of acute cholecystitis. TTE 8/12  >>>  MICROBIOLOGY: MRSA PCR 8/11:  Negative Urine Culture 8/11 >>> Blood Cultures x2 8/11 >>> 2/2 Positive GNRs Rapid ID>>> MSSA, e coli, Kleb>>>  ANTIBIOTICS: Rocephin 8/11 (x1 dose) Flagyl 8/11 (x1 dose) Zosyn 8/11 >>> Vancomycin 8/11 >>>off  LINES/TUBES: OETT 8/11 - 8/12 (self-extubation) L [place] CVL 8/11 >>> Foley 8/11 >>> PIV  SIGNIFICANT EVENTS: 08/11 - Admit 08/12 - Self-extubation in AM  ASSESSMENT / PLAN:  PULMONARY A: Acute Hypoxic Respiratory Failure:  Self-extubated 8/12. Improving. H/O IPF:  Has known PM.  H/O OSA  P:   Continuous pulse oximetry monitoring Weaning FiO2 IS  CARDIOVASCULAR A:  Shock:  Likely due to septic shock H/O Systolic CHF H/O Atrial Fibrillation H/O Hyperlipidemia H/O Essential Hypertension  P:  Neo to MAP goal 60 Goal MAP >65 & SBP >90 TTE pending Cortisol 41, no role stress roids cvp 11 Need source control  RENAL A:   ARF resolving hypoK   P:   kcl supp additional bemt in am  cvp 11 , allow even to pos balance, add saline at 50  GASTROINTESTINAL A:   Acute Calculous Cholecystitis H/O GERD  P:   NPO except ice chips Protonix IV qhs Planned for Perc Drain on 8/13, absolutely needed today, see heme  HEMATOLOGIC A:   Leukocytosis:  Secondary to sepsis. H/O Anemia Chronic NOAC:  Eliquis for Atrial Fibrillation.  P:  Holding Eliquis Will give FFp if needed to get drain immediately   INFECTIOUS A:   Severe Sepsis Acute Calculous Cholecystitis Gram Negative Rod Bacteremia  P:   Zosyn Trending Procalcitonin per algorithm Awaiting finalization of cultures, will need repeat BA after drain, source control TTE pending   ENDOCRINE A:   H/O Hypothyroidism:  TSH 0.751 & Total T4 6.0.  P:   Holding Synthroid Cortisol adaquate  NEUROLOGIC A:   Acute Encephalopathy:  Likely due to toxic metabolic. Possible Abdominal Pain:  None at present. Questionable H/O Dementia H/O Depression  P:    Discontinuing sedatives Holding Lexapro, Risperdal, Neurontin, & Ultram  Ccm time 30 min  Mcarthur Rossettianiel J. Tyson AliasFeinstein, MD, FACP Pgr: [phone] [facility]

## 2016-09-06 DIAGNOSIS — Z4659 Encounter for fitting and adjustment of other gastrointestinal appliance and device: Secondary | ICD-10-CM

## 2016-09-06 DIAGNOSIS — K838 Other specified diseases of biliary tract: Secondary | ICD-10-CM

## 2016-09-06 DIAGNOSIS — Z452 Encounter for adjustment and management of vascular access device: Secondary | ICD-10-CM

## 2016-09-06 LAB — PREPARE FRESH FROZEN PLASMA
UNIT DIVISION: 0
UNIT DIVISION: 0
Unit division: 0
Unit division: 0

## 2016-09-06 LAB — BASIC METABOLIC PANEL
ANION GAP: 6 (ref 5–15)
BUN: 11 mg/dL (ref 6–20)
CALCIUM: 8 mg/dL — AB (ref 8.9–10.3)
CO2: 27 mmol/L (ref 22–32)
Chloride: 108 mmol/L (ref 101–111)
Creatinine, Ser: 0.71 mg/dL (ref 0.44–1.00)
GFR calc Af Amer: >60 mL/min (ref >60)
Glucose, Bld: 86 mg/dL (ref 65–99)
POTASSIUM: 3.7 mmol/L (ref 3.5–5.1)
SODIUM: 141 mmol/L (ref 135–145)

## 2016-09-06 LAB — CBC
HCT: 27.3 % — ABNORMAL LOW (ref 36.0–46.0)
Hemoglobin: 8.8 g/dL — ABNORMAL LOW (ref 12.0–15.0)
MCH: 33.3 pg (ref 26.0–34.0)
MCHC: 32.2 g/dL (ref 30.0–36.0)
MCV: 103.4 fL — ABNORMAL HIGH (ref 78.0–100.0)
PLATELETS: 136 10*3/uL — AB (ref 150–400)
RBC: 2.64 MIL/uL — AB (ref 3.87–5.11)
RDW: 20.6 % — AB (ref 11.5–15.5)
WBC: 7.5 10*3/uL (ref 4.0–10.5)

## 2016-09-06 LAB — BPAM FFP
BLOOD PRODUCT EXPIRATION DATE: 201808182359
Blood Product Expiration Date: 201808162359
Blood Product Expiration Date: 201808162359
Blood Product Expiration Date: 201808182359
ISSUE DATE / TIME: 201808131011
ISSUE DATE / TIME: 201808131115
ISSUE DATE / TIME: 201808131027
ISSUE DATE / TIME: 201808131223
UNIT TYPE AND RH: 600
UNIT TYPE AND RH: 6200
Unit Type and Rh: 6200
Unit Type and Rh: 6200

## 2016-09-06 LAB — GLUCOSE, CAPILLARY
GLUCOSE-CAPILLARY: 66 mg/dL (ref 65–99)
Glucose-Capillary: 143 mg/dL — ABNORMAL HIGH (ref 65–99)

## 2016-09-06 LAB — PHOSPHORUS: Phosphorus: 2.3 mg/dL — ABNORMAL LOW (ref 2.5–4.6)

## 2016-09-06 LAB — PROCALCITONIN: PROCALCITONIN: 2.59 ng/mL

## 2016-09-06 LAB — MAGNESIUM: MAGNESIUM: 1.9 mg/dL (ref 1.7–2.4)

## 2016-09-06 MED ORDER — SODIUM CHLORIDE 0.9 % IV SOLN
3.0000 g | Freq: Three times a day (TID) | INTRAVENOUS | Status: DC
  Administered 2016-09-06 – 2016-09-13 (×21): 3 g via INTRAVENOUS
  Filled 2016-09-06 (×24): qty 3

## 2016-09-06 MED ORDER — POTASSIUM PHOSPHATES 15 MMOLE/5ML IV SOLN
10.0000 mmol | Freq: Once | INTRAVENOUS | Status: DC
  Filled 2016-09-06: qty 3.33

## 2016-09-06 MED ORDER — DEXTROSE 50 % IV SOLN
INTRAVENOUS | Status: AC
  Administered 2016-09-06: 25 mL
  Filled 2016-09-06: qty 50

## 2016-09-06 MED ORDER — POTASSIUM PHOSPHATES 15 MMOLE/5ML IV SOLN
10.0000 mmol | Freq: Once | INTRAVENOUS | Status: AC
  Administered 2016-09-06: 10 mmol via INTRAVENOUS
  Filled 2016-09-06 (×2): qty 3.33

## 2016-09-06 NOTE — Progress Notes (Signed)
PULMONARY / CRITICAL CARE MEDICINE   Name: [name] [name] MRN: [id number] DOB: 04-14-1930    ADMISSION DATE:  09/03/2016  CHIEF COMPLAINT:  Fever and Altered Mental status  HISTORY OF PRESENT ILLNESS:  83 y.o. female who at baseline suffers from moderate dementia manifested as memory loss, frequent falls and indifference to food. She was brought to the due to decreased mentation and was found to have a rectal temperature greater than 103. She has reportedly has not had cough dyspnea or abdominal pain. She has been incontinent of urine, and has been having back pain since a recent fall. In the ER she was felt to be apneic and was intubated. She has become hypotensive and received 3 liters of crystalloid, she remains hypotensive and low dose levophed has been added via peripheral IV.  SUBJECTIVE: drain placed No pressors   VITAL SIGNS: BP (!) 89/58   Pulse (!) 42   Temp 97.8 F (36.6 C) (Axillary)   Resp 18   Wt 58.7 kg (129 lb 6.6 oz)   SpO2 100%   BMI 20.27 kg/m   HEMODYNAMICS: CVP:  [10 mmHg-11 mmHg] 10 mmHg  VENTILATOR SETTINGS:    INTAKE / OUTPUT: I/O last 3 completed shifts: In: 2620.6 [I.V.:1688.6; Blood:582; IV Piggyback:350] Out: 400 [Urine:400]  PHYSICAL EXAMINATION: General: sleeping, no distress Neuro: awakens, Alert, o x 2 HEENT: jvd wnl or low PULM: CTA without crackles CV: s1 s2 RRR not tachy GI: soft, drain with brown fluid low volume, no r/g Extremities: min edema    LABS:  BMET  Recent Labs Lab 09/04/16 0303 09/05/16 0352 09/06/16 0400  NA 139 140 141  K 3.7 3.0* 3.7  CL 104 106 108  CO2 23 27 27   BUN 15 10 11   CREATININE 0.81 0.66 0.71  GLUCOSE 111* 79 86    Electrolytes  Recent Labs Lab 09/04/16 0303 09/04/16 1100 09/05/16 0352 09/06/16 0400  CALCIUM 7.7*  --  7.9* 8.0*  MG  --  1.7 2.2 1.9  PHOS  --   --  2.2* 2.3*    CBC  Recent Labs Lab 09/04/16 0303 09/05/16 0352 09/06/16 0400  WBC 21.6* 14.5* 7.5  HGB 10.0*  8.8* 8.8*  HCT 30.7* 27.3* 27.3*  PLT 193 147* 136*    Coag's  Recent Labs Lab 09/04/16 1100 09/05/16 0754  APTT 41*  --   INR 2.67 1.97    Sepsis Markers  Recent Labs Lab 09/03/16 1502  09/03/16 1535 09/03/16 1936 09/04/16 1100 09/05/16 0352 09/06/16 0400  LATICACIDVEN 2.68*  --  2.2* 1.9  --   --   --   PROCALCITON  --   < > 5.97  --  6.76 4.27 2.59  < > = values in this interval not displayed.  ABG  Recent Labs Lab 09/03/16 1431 09/04/16 0408  PHART 7.380 7.383  PCO2ART 49.6* 45.1  PO2ART 446.0* 163*    Liver Enzymes  Recent Labs Lab 09/03/16 1137 09/04/16 0303 09/05/16 0352  AST 96* 211* 80*  ALT 93* 146* 85*  ALKPHOS 335* 348* 254*  BILITOT 4.8* 5.4* 2.6*  ALBUMIN 3.1* 2.0* 2.0*    Cardiac Enzymes  Recent Labs Lab 09/03/16 1535 09/03/16 1936 09/04/16 0303  TROPONINI 0.06* 0.05* 0.04*    Glucose  Recent Labs Lab 09/04/16 1629 09/04/16 2024 09/05/16 0045 09/05/16 0411 09/06/16 0606 09/06/16 0622  GLUCAP 89 77 80 73 66 143*    Imaging Ir Perc Cholecystostomy  Result Date: 09/05/2016 INDICATION: 83 year old with acute  cholecystitis. Sepsis and needs percutaneous drainage of the gallbladder. EXAM: PERCUTANEOUS CHOLECYSTOSTOMY TUBE PLACEMENT WITH ULTRASOUND AND FLUOROSCOPIC GUIDANCE MEDICATIONS: None ANESTHESIA/SEDATION: Fentanyl 25 mcg FLUOROSCOPY TIME:  Fluoroscopy Time: 1 minutes, 23 mGy COMPLICATIONS: None immediate. PROCEDURE: Informed written consent was obtained from the patient after a thorough discussion of the procedural risks, benefits and alternatives. All questions were addressed. Maximal Sterile Barrier Technique was utilized including caps, mask, sterile gowns, sterile gloves, sterile drape, hand hygiene and skin antiseptic. A timeout was performed prior to the initiation of the procedure. Gallbladder was identified with ultrasound. The right side of the abdomen was prepped and draped in sterile fashion. Skin was  anesthetized with 1% lidocaine. 22 gauge Chiba needle was directed into the gallbladder with ultrasound guidance. 0.018 wire was advanced into the gallbladder. Accustick dilator set was placed. J wire was advanced to the gallbladder. The tract was dilated to accommodate a 10 [place] multipurpose drain. Approximately 50 mL of brown fluid was removed from the gallbladder. Fluid was sent for culture. Catheter was sutured to skin. Drain was attached to gravity bag. Fluoroscopic and ultrasound images were taken and saved for documentation. FINDINGS: Dilated gallbladder with thick wall and a small amount of surrounding fluid. Tube was placed directly into the gallbladder. A transhepatic approach was not used due to the patient's anatomy. The gallbladder was decompressed at the end of the procedure. IMPRESSION: Successful percutaneous cholecystostomy tube placement with ultrasound and fluoroscopic guidance. Electronically Signed   By: Richarda Overlie M.D.   On: 09/05/2016 18:42    IMAGING/STUDIES: CT HEAD W/O 8/11:  Prominent atrophy with minimal small vessel chronic ischemic changes of deep cerebral white matter. No acute intracranial abnormalities. CT ABDOMEN/PELVIS 8/11: IMPRESSION: 1. Findings compatible with cystic duct obstruction with marked changes of acute cholecystitis. A right upper quadrant abdomen ultrasound may provide useful additional information. 2. Small amount of free peritoneal fluid. 3. Diffuse hepatic steatosis and transient enhancement phenomenon in the right lobe. 4. Interval atelectasis or pneumonia in both lower lobes, greater on the right with stable underlying interstitial fibrosis and peripheral honeycombing. 5. Extensive dense calcified coronary artery atherosclerosis. 6. Moderately large hiatal hernia. RUQ U/S 8/11:  Gallbladder distention with mural thickening, pericholecystic fluid, biliary sludge and calculi in keeping with changes of acute cholecystitis. TTE 8/12  >>>  MICROBIOLOGY: MRSA PCR 8/11:  Negative Urine Culture 8/11 >>> Blood Cultures x2 8/11 >>> 2/2 Positive GNRs Rapid ID>>> MSSA, e coli, Kleb>>> Chole drain 8/13>>> BC 8/14>>>  ANTIBIOTICS: Rocephin 8/11 (x1 dose) Flagyl 8/11 (x1 dose) Zosyn 8/11 >>> Vancomycin 8/11 >>>off  LINES/TUBES: OETT 8/11 - 8/12 (self-extubation) L [place] CVL 8/11 >>> Foley 8/11 >>> PIV Chole 8/13>>>  SIGNIFICANT EVENTS: 08/11 - Admit 08/12 - Self-extubation in AM 8/13- drain placed  ASSESSMENT / PLAN:  PULMONARY A: Acute Hypoxic Respiratory Failure:  Self-extubated 8/12. Improving. H/O IPF:  Has known PM.  H/O OSA  P:   Allow pos balance IS  CARDIOVASCULAR A:  Shock:  Likely due to septic shock H/O Systolic CHF H/O Atrial Fibrillation H/O Hyperlipidemia H/O Essential Hypertension  P:  Neo to MAP goal 60 Goal MAP >60 TTE reviewed has rt side pressure increased and some rt heart dilation Need source control - done, no active hypotension followed Dc line neck  RENAL A:   ARF resolving  P:   Can KVO fluids Dc d10, adding diet? Then dc this  GASTROINTESTINAL A:   Acute Calculous Cholecystitis - post drain 8/14 H/O GERD  P:  Add clears Protonix IV qhs  HEMATOLOGIC A:   Leukocytosis:  Secondary to sepsis. H/O Anemia Chronic NOAC:  Eliquis for Atrial Fibrillation. ffp for drain given  P:  Holding Eliquis further assess drain risk bleeding scd  INFECTIOUS A:   Severe Sepsis Acute Calculous Cholecystitis Gram Negative Rod Bacteremia  P:   Zosyn Repeat BC folllow drain growth  ENDOCRINE A:   H/O Hypothyroidism:  TSH 0.751 & Total T4 6.0.  P:   Holding Synthroid Once on advanced diet likley can dc d10  NEUROLOGIC A:   Acute Encephalopathy:  improved Questionable H/O Dementia H/O Depression  P:   Holding Lexapro, Risperdal, Neurontin, & Ultram Follow more awakeness prior to start    Mcarthur Rossettianiel J. Tyson AliasFeinstein, MD, FACP Pgr: [phone] [place]  [facility]

## 2016-09-06 NOTE — Progress Notes (Signed)
Assessment/Plan: [name] [name] is a 83 y.o. female with multiple medical problems including dementia that was admitted 09/03/16 with sepsis. Found to have evidence of cholecystitis by CT scan also with positive blood cultures with BCID ID Klebsiella Escherichia coli and methicillin sensitive Staphylococcus aureus. BCx only growing E Coli.   1. Bacteremia   - pansensitive Ecoli on culture  - No growth of Klebsiella pneumo or MSSA on actual culture, however + for BCID  - Sepsis resolved and off pressors today  - BCx sterile from 8/13 - Antibiotics narrowed to Unasyn    2. Cholecystitis - s/p chole drain placement  - GS with abundant WBC and GVRs; no growth on culture.  - Likely source of infection however does not really explain MSSA detection on BCID.  - Not currently surgical candidate - Seems drain will remain 5 - 6 weeks at minimum pending further follow up in outpatient drain clinic  Will assume MSSA to be pathogenic here although it did not grow on culture - with drain placement she will likely need 2 weeks antibiotic treatment minimum. MD note to follow.    Janene Madeira, MSN, NP-C [facility]  09/06/2016 1:35 PM     Subjective: Mentally appears better and with appropriate conversation. Reports she feels much better.   Antibiotics:  Anti-infectives    Start     Dose/Rate Route Frequency Ordered Stop   09/06/16 1400  Ampicillin-Sulbactam (UNASYN) 3 g in sodium chloride 0.9 % 100 mL IVPB     3 g 200 mL/hr over 30 Minutes Intravenous Every 8 hours 09/06/16 1304     09/04/16 1200  vancomycin (VANCOCIN) IVPB 1000 mg/200 mL premix  Status:  Discontinued     1,000 mg 200 mL/hr over 60 Minutes Intravenous Every 24 hours 09/03/16 1443 09/04/16 0828   09/03/16 2200  piperacillin-tazobactam (ZOSYN) IVPB 3.375 g  Status:  Discontinued     3.375 g 100 mL/hr over 30 Minutes Intravenous Every 8 hours 09/03/16 1858 09/03/16 1901    09/03/16 2200  piperacillin-tazobactam (ZOSYN) IVPB 3.375 g  Status:  Discontinued     3.375 g 12.5 mL/hr over 240 Minutes Intravenous Every 8 hours 09/03/16 1902 09/06/16 1304   09/03/16 1600  metroNIDAZOLE (FLAGYL) IVPB 500 mg  Status:  Discontinued     500 mg 100 mL/hr over 60 Minutes Intravenous Every 8 hours 09/03/16 1418 09/03/16 1858   09/03/16 1500  cefTRIAXone (ROCEPHIN) 2 g in dextrose 5 % 50 mL IVPB  Status:  Discontinued     2 g 100 mL/hr over 30 Minutes Intravenous Every 12 hours 09/03/16 1418 09/03/16 1858   09/03/16 1430  vancomycin (VANCOCIN) 500 mg in sodium chloride 0.9 % 100 mL IVPB  Status:  Discontinued     500 mg 100 mL/hr over 60 Minutes Intravenous Every 12 hours 09/03/16 1418 09/03/16 1443   09/03/16 1145  vancomycin (VANCOCIN) IVPB 1000 mg/200 mL premix     1,000 mg 200 mL/hr over 60 Minutes Intravenous  Once 09/03/16 1134 09/03/16 1330   09/03/16 1145  piperacillin-tazobactam (ZOSYN) IVPB 3.375 g     3.375 g 100 mL/hr over 30 Minutes Intravenous  Once 09/03/16 1134 09/03/16 1239      Medications: Scheduled Meds: . chlorhexidine gluconate (MEDLINE KIT)  15 mL Mouth Rinse BID  . mouth rinse  15 mL Mouth Rinse QID  . pantoprazole (PROTONIX) IV  40 mg Intravenous QHS   Continuous Infusions: .  sodium chloride    . sodium chloride 10 mL/hr at 09/06/16 0800  . ampicillin-sulbactam (UNASYN) IV    . dextrose 10 mL/hr at 09/06/16 0800  . phenylephrine (NEO-SYNEPHRINE) Adult infusion Stopped (09/05/16 1620)  . potassium PHOSPHATE IVPB (mmol) 10 mmol (09/06/16 1139)   PRN Meds:.sodium chloride   Objective: Weight change: 2 lb 13.9 oz (1.3 kg)  Intake/Output Summary (Last 24 hours) at 09/06/16 1328 Last data filed at 09/06/16 1139  Gross per 24 hour  Intake             1000 ml  Output              110 ml  Net              890 ml   Blood pressure 104/70, pulse (!) 104, temperature 97.7 F (36.5 C), temperature source Oral, resp. rate 19, weight 129 lb  6.6 oz (58.7 kg), SpO2 100 %. Temp:  [97.4 F (36.3 C)-97.8 F (36.6 C)] 97.7 F (36.5 C) (08/14 1150) Pulse Rate:  [42-147] 104 (08/14 1200) Resp:  [17-28] 19 (08/14 1200) BP: (89-123)/(41-93) 104/70 (08/14 1200) SpO2:  [98 %-100 %] 100 % (08/14 1200) Weight:  [129 lb 6.6 oz (58.7 kg)] 129 lb 6.6 oz (58.7 kg) (08/14 0405)  Physical Exam: General: pleasant sitting up in bed today. Unaware of drain placement.  HEENT:  EOMI CVS tachycardic rate, normal r,  no murmur rubs or gallops Chest: no wheezing, Abdomen: soft nontender, nondistended, normal bowel sounds, Extremities: no  clubbing or edema noted bilaterally Skin: She has cut on her chin Neuro: not oriented to current situation but pleasant in conversation   CBC: CBC Latest Ref Rng & Units 09/06/2016 09/05/2016 09/04/2016  WBC 4.0 - 10.5 K/uL 7.5 14.5(H) 21.6(H)  Hemoglobin 12.0 - 15.0 g/dL 8.8(L) 8.8(L) 10.0(L)  Hematocrit 36.0 - 46.0 % 27.3(L) 27.3(L) 30.7(L)  Platelets 150 - 400 K/uL 136(L) 147(L) 193     BMET  Recent Labs  09/05/16 0352 09/06/16 0400  NA 140 141  K 3.0* 3.7  CL 106 108  CO2 27 27  GLUCOSE 79 86  BUN 10 11  CREATININE 0.66 0.71  CALCIUM 7.9* 8.0*    Liver Panel   Recent Labs  09/04/16 0303 09/05/16 0352  PROT 6.2* 5.3*  ALBUMIN 2.0* 2.0*  AST 211* 80*  ALT 146* 85*  ALKPHOS 348* 254*  BILITOT 5.4* 2.6*    Micro Results: Recent Results (from the past 720 hour(s))  Blood Culture (routine x 2)     Status: Abnormal (Preliminary result)   Collection Time: 09/03/16 11:36 AM  Result Value Ref Range Status   Specimen Description BLOOD LEFT FOREARM  Final   Special Requests   Final    BOTTLES DRAWN AEROBIC AND ANAEROBIC Blood Culture adequate volume   Culture  Setup Time   Final    GRAM NEGATIVE RODS IN BOTH AEROBIC AND ANAEROBIC BOTTLES CRITICAL RESULT CALLED TO, READ BACK BY AND VERIFIED WITH: K COOK 09/04/16 @ 0627 M VESTAL    Culture (A)  Final    ESCHERICHIA  COLI SUSCEPTIBILITIES PERFORMED ON PREVIOUS CULTURE WITHIN THE LAST 5 DAYS.    Report Status PENDING  Incomplete  Blood Culture ID Panel (Reflexed)     Status: Abnormal   Collection Time: 09/03/16 11:36 AM  Result Value Ref Range Status   Enterococcus species NOT DETECTED NOT DETECTED Final   Vancomycin resistance NOT DETECTED NOT DETECTED Final   Listeria monocytogenes NOT  DETECTED NOT DETECTED Final   Staphylococcus species DETECTED (A) NOT DETECTED Final    Comment: CRITICAL RESULT CALLED TO, READ BACK BY AND VERIFIED WITH: K COOK 09/04/16 @ 0627 M VESTAL    Staphylococcus aureus DETECTED (A) NOT DETECTED Final    Comment: CRITICAL RESULT CALLED TO, READ BACK BY AND VERIFIED WITH: K COOK 09/04/16 @ 0627 M VESTAL    Methicillin resistance NOT DETECTED NOT DETECTED Final   Streptococcus species NOT DETECTED NOT DETECTED Final   Streptococcus agalactiae NOT DETECTED NOT DETECTED Final   Streptococcus pneumoniae NOT DETECTED NOT DETECTED Final   Streptococcus pyogenes NOT DETECTED NOT DETECTED Final   Acinetobacter baumannii NOT DETECTED NOT DETECTED Final   Enterobacteriaceae species DETECTED (A) NOT DETECTED Final    Comment: CRITICAL RESULT CALLED TO, READ BACK BY AND VERIFIED WITH: K COOK 09/04/16 @ 0627 M VESTAL    Enterobacter cloacae complex NOT DETECTED NOT DETECTED Final   Escherichia coli DETECTED (A) NOT DETECTED Final    Comment: CRITICAL RESULT CALLED TO, READ BACK BY AND VERIFIED WITH: K COOK 09/04/16 @ 0627 M VESTAL    Klebsiella oxytoca NOT DETECTED NOT DETECTED Final   Klebsiella pneumoniae DETECTED (A) NOT DETECTED Final    Comment: CRITICAL RESULT CALLED TO, READ BACK BY AND VERIFIED WITH: K COOK 09/04/16 @ 0627 M VESTAL    Proteus species NOT DETECTED NOT DETECTED Final   Serratia marcescens NOT DETECTED NOT DETECTED Final   Carbapenem resistance NOT DETECTED NOT DETECTED Final   Haemophilus influenzae NOT DETECTED NOT DETECTED Final   Neisseria meningitidis  NOT DETECTED NOT DETECTED Final   Pseudomonas aeruginosa NOT DETECTED NOT DETECTED Final   Candida albicans NOT DETECTED NOT DETECTED Final   Candida glabrata NOT DETECTED NOT DETECTED Final   Candida krusei NOT DETECTED NOT DETECTED Final   Candida parapsilosis NOT DETECTED NOT DETECTED Final   Candida tropicalis NOT DETECTED NOT DETECTED Final  Blood Culture (routine x 2)     Status: Abnormal (Preliminary result)   Collection Time: 09/03/16 11:45 AM  Result Value Ref Range Status   Specimen Description BLOOD LEFT FOREARM  Final   Special Requests   Final    IN PEDIATRIC BOTTLE Blood Culture results may not be optimal due to an excessive volume of blood received in culture bottles   Culture  Setup Time   Final    GRAM NEGATIVE RODS IN PEDIATRIC BOTTLE CRITICAL RESULT CALLED TO, READ BACK BY AND VERIFIED WITH: K COOK 09/04/16 @ 0627 M VESTAL    Culture ESCHERICHIA COLI (A)  Final   Report Status PENDING  Incomplete   Organism ID, Bacteria ESCHERICHIA COLI  Final      Susceptibility   Escherichia coli - MIC*    AMPICILLIN 4 SENSITIVE Sensitive     CEFAZOLIN <=4 SENSITIVE Sensitive     CEFEPIME <=1 SENSITIVE Sensitive     CEFTAZIDIME <=1 SENSITIVE Sensitive     CEFTRIAXONE <=1 SENSITIVE Sensitive     CIPROFLOXACIN <=0.25 SENSITIVE Sensitive     GENTAMICIN <=1 SENSITIVE Sensitive     IMIPENEM <=0.25 SENSITIVE Sensitive     TRIMETH/SULFA <=20 SENSITIVE Sensitive     AMPICILLIN/SULBACTAM <=2 SENSITIVE Sensitive     PIP/TAZO <=4 SENSITIVE Sensitive     Extended ESBL NEGATIVE Sensitive     * ESCHERICHIA COLI  Urine culture     Status: None   Collection Time: 09/03/16 12:27 PM  Result Value Ref Range Status   Specimen Description URINE,  RANDOM  Final   Special Requests NONE  Final   Culture NO GROWTH  Final   Report Status 09/04/2016 FINAL  Final  MRSA PCR Screening     Status: None   Collection Time: 09/03/16  4:42 PM  Result Value Ref Range Status   MRSA by PCR NEGATIVE  NEGATIVE Final    Comment:        The GeneXpert MRSA Assay (FDA approved for NASAL specimens only), is one component of a comprehensive MRSA colonization surveillance program. It is not intended to diagnose MRSA infection nor to guide or monitor treatment for MRSA infections.   Culture, blood (Routine X 2) w Reflex to ID Panel     Status: None (Preliminary result)   Collection Time: 09/05/16 12:28 PM  Result Value Ref Range Status   Specimen Description BLOOD RIGHT HAND  Final   Special Requests IN PEDIATRIC BOTTLE Blood Culture adequate volume  Final   Culture NO GROWTH < 24 HOURS  Final   Report Status PENDING  Incomplete  Culture, blood (Routine X 2) w Reflex to ID Panel     Status: None (Preliminary result)   Collection Time: 09/05/16 12:28 PM  Result Value Ref Range Status   Specimen Description BLOOD LEFT HAND  Final   Special Requests IN PEDIATRIC BOTTLE Blood Culture adequate volume  Final   Culture NO GROWTH < 24 HOURS  Final   Report Status PENDING  Incomplete  Body fluid culture     Status: None (Preliminary result)   Collection Time: 09/05/16  4:36 PM  Result Value Ref Range Status   Specimen Description BILE  Final   Special Requests NONE  Final   Gram Stain   Final    ABUNDANT WBC PRESENT, PREDOMINANTLY PMN ABUNDANT GRAM VARIABLE ROD    Culture NO GROWTH < 24 HOURS  Final   Report Status PENDING  Incomplete    Studies/Results: Ir Perc Cholecystostomy  Result Date: 09/05/2016 INDICATION: 83 year old with acute cholecystitis. Sepsis and needs percutaneous drainage of the gallbladder. EXAM: PERCUTANEOUS CHOLECYSTOSTOMY TUBE PLACEMENT WITH ULTRASOUND AND FLUOROSCOPIC GUIDANCE MEDICATIONS: None ANESTHESIA/SEDATION: Fentanyl 25 mcg FLUOROSCOPY TIME:  Fluoroscopy Time: 1 minutes, 23 mGy COMPLICATIONS: None immediate. PROCEDURE: Informed written consent was obtained from the patient after a thorough discussion of the procedural risks, benefits and alternatives. All  questions were addressed. Maximal Sterile Barrier Technique was utilized including caps, mask, sterile gowns, sterile gloves, sterile drape, hand hygiene and skin antiseptic. A timeout was performed prior to the initiation of the procedure. Gallbladder was identified with ultrasound. The right side of the abdomen was prepped and draped in sterile fashion. Skin was anesthetized with 1% lidocaine. [place] needle was directed into the gallbladder with ultrasound guidance. 0.018 wire was advanced into the gallbladder. Accustick dilator set was placed. J wire was advanced to the gallbladder. The tract was dilated to accommodate a 10 [place] multipurpose drain. Approximately 50 mL of brown fluid was removed from the gallbladder. Fluid was sent for culture. Catheter was sutured to skin. Drain was attached to gravity bag. Fluoroscopic and ultrasound images were taken and saved for documentation. FINDINGS: Dilated gallbladder with thick wall and a small amount of surrounding fluid. Tube was placed directly into the gallbladder. A transhepatic approach was not used due to the patient's anatomy. The gallbladder was decompressed at the end of the procedure. IMPRESSION: Successful percutaneous cholecystostomy tube placement with ultrasound and fluoroscopic guidance. Electronically Signed   By: Scherrie Gerlach.D.  On: 09/05/2016 18:42

## 2016-09-06 NOTE — Progress Notes (Signed)
[facility] Progress Note     Subjective: CC:  Somnolent. Oriented to person and place, not time. Denies abdominal pain, nausea, or vomiting.  Still on Neo Objective: Vital signs in last 24 hours: Temp:  [97.5 F (36.4 C)-98 F (36.7 C)] 97.8 F (36.6 C) (08/14 0318) Pulse Rate:  [42-147] 42 (08/14 0600) Resp:  [16-28] 18 (08/14 0600) BP: (89-123)/(41-93) 89/58 (08/14 0600) SpO2:  [91 %-100 %] 100 % (08/14 0600) Weight:  [58.7 kg (129 lb 6.6 oz)] 58.7 kg (129 lb 6.6 oz) (08/14 0405) Last BM Date: 09/05/16  Intake/Output from previous day: 08/13 0701 - 08/14 0700 In: 1547 [I.V.:715; Blood:582; IV Piggyback:250] Out: 200 [Urine:200] Intake/Output this shift: No intake/output data recorded.  PE: Gen:  Somnolent Neck: supple, no thyroid nodules, no stridor Card:  Regular rate and rhythm, pedal pulses 2+ BL Pulm:  Normal effort, clear to auscultation bilaterally Abd: Soft, non-tender, non-distended, bowel sounds present in all 4 quadrants, RUQ cholecystostomy tube with 5-10 cc bilious, brown drainage.  Skin: warm and dry, no rashes  Psych: A&Ox3   Lab Results:   Recent Labs  09/05/16 0352 09/06/16 0400  WBC 14.5* 7.5  HGB 8.8* 8.8*  HCT 27.3* 27.3*  PLT 147* 136*   BMET  Recent Labs  09/05/16 0352 09/06/16 0400  NA 140 141  K 3.0* 3.7  CL 106 108  CO2 27 27  GLUCOSE 79 86  BUN 10 11  CREATININE 0.66 0.71  CALCIUM 7.9* 8.0*   PT/INR  Recent Labs  09/04/16 1100 09/05/16 0754  LABPROT 29.0* 22.7*  INR 2.67 1.97   CMP     Component Value Date/Time   NA 141 09/06/2016 0400   NA 141 12/04/2015   K 3.7 09/06/2016 0400   CL 108 09/06/2016 0400   CO2 27 09/06/2016 0400   GLUCOSE 86 09/06/2016 0400   BUN 11 09/06/2016 0400   BUN 17 12/04/2015   CREATININE 0.71 09/06/2016 0400   CALCIUM 8.0 (L) 09/06/2016 0400   PROT 5.3 (L) 09/05/2016 0352   ALBUMIN 2.0 (L) 09/05/2016 0352   AST 80 (H) 09/05/2016 0352   ALT 85 (H) 09/05/2016 0352    ALKPHOS 254 (H) 09/05/2016 0352   BILITOT 2.6 (H) 09/05/2016 0352   GFRNONAA >60 09/06/2016 0400   GFRAA >60 09/06/2016 0400   Lipase  No results found for: LIPASE     Studies/Results: Ir Perc Cholecystostomy  Result Date: 09/05/2016 INDICATION: 83 year old with acute cholecystitis. Sepsis and needs percutaneous drainage of the gallbladder. EXAM: PERCUTANEOUS CHOLECYSTOSTOMY TUBE PLACEMENT WITH ULTRASOUND AND FLUOROSCOPIC GUIDANCE MEDICATIONS: None ANESTHESIA/SEDATION: Fentanyl 25 mcg FLUOROSCOPY TIME:  Fluoroscopy Time: 1 minutes, 23 mGy COMPLICATIONS: None immediate. PROCEDURE: Informed written consent was obtained from the patient after a thorough discussion of the procedural risks, benefits and alternatives. All questions were addressed. Maximal Sterile Barrier Technique was utilized including caps, mask, sterile gowns, sterile gloves, sterile drape, hand hygiene and skin antiseptic. A timeout was performed prior to the initiation of the procedure. Gallbladder was identified with ultrasound. The right side of the abdomen was prepped and draped in sterile fashion. Skin was anesthetized with 1% lidocaine. 22 gauge [place] needle was directed into the gallbladder with ultrasound guidance. 0.018 wire was advanced into the gallbladder. Accustick dilator set was placed. J wire was advanced to the gallbladder. The tract was dilated to accommodate a 10 [place] multipurpose drain. Approximately 50 mL of brown fluid was removed from the gallbladder. Fluid was sent for culture. Catheter  was sutured to skin. Drain was attached to gravity bag. Fluoroscopic and ultrasound images were taken and saved for documentation. FINDINGS: Dilated gallbladder with thick wall and a small amount of surrounding fluid. Tube was placed directly into the gallbladder. A transhepatic approach was not used due to the patient's anatomy. The gallbladder was decompressed at the end of the procedure. IMPRESSION: Successful percutaneous  cholecystostomy tube placement with ultrasound and fluoroscopic guidance. Electronically Signed   By: Richarda OverlieAdam  Henn M.D.   On: 09/05/2016 18:42    Anti-infectives: Anti-infectives    Start     Dose/Rate Route Frequency Ordered Stop   09/04/16 1200  vancomycin (VANCOCIN) IVPB 1000 mg/200 mL premix  Status:  Discontinued     1,000 mg 200 mL/hr over 60 Minutes Intravenous Every 24 hours 09/03/16 1443 09/04/16 0828   09/03/16 2200  piperacillin-tazobactam (ZOSYN) IVPB 3.375 g  Status:  Discontinued     3.375 g 100 mL/hr over 30 Minutes Intravenous Every 8 hours 09/03/16 1858 09/03/16 1901   09/03/16 2200  piperacillin-tazobactam (ZOSYN) IVPB 3.375 g     3.375 g 12.5 mL/hr over 240 Minutes Intravenous Every 8 hours 09/03/16 1902     09/03/16 1600  metroNIDAZOLE (FLAGYL) IVPB 500 mg  Status:  Discontinued     500 mg 100 mL/hr over 60 Minutes Intravenous Every 8 hours 09/03/16 1418 09/03/16 1858   09/03/16 1500  cefTRIAXone (ROCEPHIN) 2 g in dextrose 5 % 50 mL IVPB  Status:  Discontinued     2 g 100 mL/hr over 30 Minutes Intravenous Every 12 hours 09/03/16 1418 09/03/16 1858   09/03/16 1430  vancomycin (VANCOCIN) 500 mg in sodium chloride 0.9 % 100 mL IVPB  Status:  Discontinued     500 mg 100 mL/hr over 60 Minutes Intravenous Every 12 hours 09/03/16 1418 09/03/16 1443   09/03/16 1145  vancomycin (VANCOCIN) IVPB 1000 mg/200 mL premix     1,000 mg 200 mL/hr over 60 Minutes Intravenous  Once 09/03/16 1134 09/03/16 1330   09/03/16 1145  piperacillin-tazobactam (ZOSYN) IVPB 3.375 g     3.375 g 100 mL/hr over 30 Minutes Intravenous  Once 09/03/16 1134 09/03/16 1239     Assessment/Plan Acute calculous cholecystitis S/p percutaneous cholecystostomy tube placement 8/13 Dr. Lowella DandyHenn  - not a surgical candidate - 50 cc brown fluid drained during above procedure, CX pending - sepsis mgmt per CCM and IV abx per ID/above cultures  - drain care/follow up per radiology. - general surgery will sign off,  call as needed with questions or concerns.  FEN: clear liquids ID: Sepsis, GNR bacteremia; Zosyn 8/11 >> VTE: SCD's   LOS: 3 days    Adam PhenixElizabeth S Simaan , [facility] 09/06/2016, 8:19 AM Pager: [phone] Consults: [phone] Mon-Fri 7:00 am-4:30 pm Sat-Sun 7:00 am-11:30 am

## 2016-09-06 NOTE — Progress Notes (Signed)
eLink Physician-Brief Progress Note Patient Name: Kirstie MirzaGloria G Mondo DOB: 02/06/1930 MRN: [id number]   Date of Service  09/06/2016  HPI/Events of Note  Choking on clear liquids.   eICU Interventions  Will order: 1. NPO. 2. Speech swallowing evaluation in AM.     Intervention Category Intermediate Interventions: Other:  Lenell AntuSommer,Delorice Bannister Eugene 09/06/2016, 6:59 PM

## 2016-09-06 NOTE — Progress Notes (Signed)
Referring Physician(s): Dr Staci Acosta  Supervising Physician: Simonne Come  Patient Status:  [facility] - In-pt  Chief Complaint:  Percutaneous cholecystostomy drain placed 8/13  Subjective:  Up in bed Feels better today Has no complaints No pain Output good  Allergies: Clindamycin hcl; Rofecoxib; Celecoxib; and Sertraline hcl  Medications: Prior to Admission medications   Medication Sig Start Date End Date Taking? Authorizing Provider  acetaminophen (TYLENOL) 500 MG tablet Take 1,000 mg by mouth every 12 (twelve) hours as needed (moderate to severe pain).    Yes [provider]  carvedilol (COREG) 3.125 MG tablet TAKE 1 TABLET TWICE DAILY. Patient taking differently: TAKE 1 TABLET (3.125 MG) BY MOUTH TWICE DAILY. 02/02/16  Yes Nelwyn Salisbury, MD  ELIQUIS 2.5 MG TABS tablet TAKE 1 TABLET TWICE DAILY. Patient taking differently: TAKE 1 TABLET(2.5 MG) BY MOUTH TWICE DAILY. 03/21/16  Yes Nelwyn Salisbury, MD  ENSURE (ENSURE) Take by mouth 2 (two) times daily.   Yes [provider]  escitalopram (LEXAPRO) 10 MG tablet Take 1 tablet (10 mg total) by mouth daily. 02/03/16  Yes Nelwyn Salisbury, MD  ferrous sulfate 325 (65 FE) MG tablet TAKE 1 TABLET TWICE DAILY. Patient taking differently: TAKE 1 TABLET (325 MG) BY MOUTH TWICE DAILY. 03/29/16  Yes Nelwyn Salisbury, MD  furosemide (LASIX) 40 MG tablet Take 1 tablet (40 mg total) by mouth daily. 11/24/15  Yes Johnson, Clanford L, MD  gabapentin (NEURONTIN) 100 MG capsule TAKE 1 CAPSULE EVERY DAY. Patient taking differently: TAKE 1 CAPSULE (100 MG) BY MOUTH EVERY DAY 02/22/16  Yes Nelwyn Salisbury, MD  levothyroxine (SYNTHROID, LEVOTHROID) 75 MCG tablet Take 75 mcg by mouth daily before breakfast.   Yes [provider]  magnesium oxide (MAG-OX) 400 MG tablet Take 800 mg by mouth daily. For constipation   Yes [provider]  pantoprazole (PROTONIX) 40 MG tablet Take 1 tablet (40 mg total) by mouth daily. 05/15/15  Yes  Azalee Course, PA  potassium chloride (KLOR-CON) 20 MEQ packet Take 20 mEq by mouth 2 (two) times daily. 02/03/16  Yes Nelwyn Salisbury, MD  risperiDONE (RISPERDAL) 0.5 MG tablet Once daily at suppertime Patient taking differently: 0.5 mg daily with supper.  08/30/16  Yes Nelwyn Salisbury, MD  traMADol (ULTRAM) 50 MG tablet Take 2 tablets (100 mg total) by mouth every 6 (six) hours as needed for moderate pain. Patient taking differently: Take 100 mg by mouth every 6 (six) hours as needed for moderate pain or severe pain.  08/30/16  Yes Nelwyn Salisbury, MD     Vital Signs: BP 113/61   Pulse 85   Temp 97.7 F (36.5 C) (Oral)   Resp (!) 24   Wt 129 lb 6.6 oz (58.7 kg)   SpO2 100%   BMI 20.27 kg/m   Physical Exam  Abdominal: Soft. Bowel sounds are normal.  Neurological: She is alert.  Skin: Skin is warm and dry.  Site of drain is clean and dry NT No bleeding OP bile Abundant wbcs on GS  Psychiatric: She has a normal mood and affect. Her behavior is normal.  Nursing note and vitals reviewed.   Imaging: Ct Head Wo Contrast  Result Date: 09/03/2016 CLINICAL DATA:  Found down at nursing home, respiratory distress, altered level of consciousness, history hypertension, dementia, CHF, pulmonary fibrosis EXAM: CT HEAD WITHOUT CONTRAST TECHNIQUE: Contiguous axial images were obtained from the base of the skull through the vertex without intravenous contrast. Sagittal  and coronal MPR images reconstructed from axial data set. COMPARISON:  03/07/2016 FINDINGS: Brain: Generalized atrophy. Normal ventricular morphology. No midline shift or mass effect. Minimal small vessel chronic ischemic changes of deep cerebral white matter. No intracranial hemorrhage, mass lesion, evidence of acute infarction, or extra-axial fluid collection. Vascular: Atherosclerotic calcification of internal carotid arteries at skullbase Skull: Intact Sinuses/Orbits: Clear.  Dependent fluid in the nasopharynx Other: N/A IMPRESSION:  Prominent atrophy with minimal small vessel chronic ischemic changes of deep cerebral white matter. No acute intracranial abnormalities. Electronically Signed   By: Ulyses Southward M.D.   On: 09/03/2016 16:45   Ct Abdomen Pelvis W Contrast  Result Date: 09/03/2016 CLINICAL DATA:  Abdominal pain.  Respiratory distress. EXAM: CT ABDOMEN AND PELVIS WITH CONTRAST TECHNIQUE: Multidetector CT imaging of the abdomen and pelvis was performed using the standard protocol following bolus administration of intravenous contrast. CONTRAST:  80mL ISOVUE-300 IOPAMIDOL (ISOVUE-300) INJECTION 61% COMPARISON:  Chest CT dated 02/07/2014. Right upper quadrant abdomen ultrasound dated 11/20/2015. FINDINGS: Lower chest: Again demonstrated are fibrotic changes with peripheral honeycombing at both lung bases. There is interval increased density in the posterior aspects of both lower lobes, greater on the right. No pleural fluid seen. Moderately large hiatal hernia. Enlarged heart. Small pericardial effusion with a maximum thickness of 11 mm. Extensive atheromatous coronary artery calcifications. Hepatobiliary: Interval diffuse low density of the liver relative to the spleen. There is a linear branching area of increased enhancement in the right lobe of the liver on the initial images, not seen on the delayed images. There is extensive pericholecystic fluid and soft tissue stranding. Also demonstrated is marked dilatation of the cystic duct with mucosal enhancement and dilatation of the gallbladder with mucosal enhancement. No calcified gallstones visualized. No gallstones visualized on the previous ultrasound. Pancreas: Pancreatic atrophy. Spleen: Normal in size without focal abnormality. Adrenals/Urinary Tract: Adrenal glands are unremarkable. Kidneys are normal, without renal calculi, focal lesion, or hydronephrosis. Bladder is unremarkable. Stomach/Bowel: Moderately large hiatal hernia. No small bowel or colonic abnormalities  demonstrated. No evidence of appendicitis, surgically absent by history. Vascular/Lymphatic: Arterial calcifications, including dense coronary artery calcifications. No aneurysm or enlarged lymph nodes. Reproductive: Small uterus.  No adnexal masses. Other: Small amount of free peritoneal fluid. No abdominal wall hernia seen. Musculoskeletal: Multiple lumbar and lower thoracic vertebral compression deformities, some with kyphoplasty material. No acute fracture lines seen. Mild bilateral hip degenerative changes. IMPRESSION: 1. Findings compatible with cystic duct obstruction with marked changes of acute cholecystitis. A right upper quadrant abdomen ultrasound may provide useful additional information. 2. Small amount of free peritoneal fluid. 3. Diffuse hepatic steatosis and transient enhancement phenomenon in the right lobe. 4. Interval atelectasis or pneumonia in both lower lobes, greater on the right with stable underlying interstitial fibrosis and peripheral honeycombing. 5. Extensive dense calcified coronary artery atherosclerosis. 6. Moderately large hiatal hernia. Electronically Signed   By: Beckie Salts M.D.   On: 09/03/2016 17:10   Ir Perc Cholecystostomy  Result Date: 09/05/2016 INDICATION: 83 year old with acute cholecystitis. Sepsis and needs percutaneous drainage of the gallbladder. EXAM: PERCUTANEOUS CHOLECYSTOSTOMY TUBE PLACEMENT WITH ULTRASOUND AND FLUOROSCOPIC GUIDANCE MEDICATIONS: None ANESTHESIA/SEDATION: Fentanyl 25 mcg FLUOROSCOPY TIME:  Fluoroscopy Time: 1 minutes, 23 mGy COMPLICATIONS: None immediate. PROCEDURE: Informed written consent was obtained from the patient after a thorough discussion of the procedural risks, benefits and alternatives. All questions were addressed. Maximal Sterile Barrier Technique was utilized including caps, mask, sterile gowns, sterile gloves, sterile drape, hand hygiene and skin antiseptic. A timeout  was performed prior to the initiation of the procedure.  Gallbladder was identified with ultrasound. The right side of the abdomen was prepped and draped in sterile fashion. Skin was anesthetized with 1% lidocaine. 22 gauge Chiba needle was directed into the gallbladder with ultrasound guidance. 0.018 wire was advanced into the gallbladder. Accustick dilator set was placed. J wire was advanced to the gallbladder. The tract was dilated to accommodate a 10 [place] multipurpose drain. Approximately 50 mL of brown fluid was removed from the gallbladder. Fluid was sent for culture. Catheter was sutured to skin. Drain was attached to gravity bag. Fluoroscopic and ultrasound images were taken and saved for documentation. FINDINGS: Dilated gallbladder with thick wall and a small amount of surrounding fluid. Tube was placed directly into the gallbladder. A transhepatic approach was not used due to the patient's anatomy. The gallbladder was decompressed at the end of the procedure. IMPRESSION: Successful percutaneous cholecystostomy tube placement with ultrasound and fluoroscopic guidance. Electronically Signed   By: Richarda Overlie M.D.   On: 09/05/2016 18:42   Dg Chest Port 1 View  Result Date: 09/03/2016 CLINICAL DATA:  83 year old female under evaluation for are central line placement. EXAM: PORTABLE CHEST 1 VIEW COMPARISON:  Chest x-ray 09/03/2016. FINDINGS: New left upper extremity PICC with tip terminating in the distal superior vena cava. An endotracheal tube is in place with tip 5.1 cm above the carina. Lung volumes are low. Diffuse interstitial prominence throughout the lungs bilaterally, most evident throughout the mid to lower lungs, compatible with underlying interstitial lung disease. No definite acute consolidative airspace disease. Lateral pleural fluid or pleural thickening noted in the right mid hemithorax. No definite left pleural effusion. No evidence of pulmonary edema. Heart size is borderline enlarged. Large hiatal hernia. Upper mediastinal contours are  distorted by patient positioning. Aortic atherosclerosis. IMPRESSION: 1. Support apparatus, as above. 2. Chronic changes of interstitial lung disease, similar to prior studies, presumably from underlying usual interstitial pneumonia (UIP). 3. Lateral pleural thickening or lateral loculated pleural fluid in the right mid hemithorax. 4. Aortic atherosclerosis. 5. Large hiatal hernia. Electronically Signed   By: Trudie Reed M.D.   On: 09/03/2016 19:29   Dg Chest Portable 1 View  Result Date: 09/03/2016 CLINICAL DATA:  Intubation, hypertension, CHF, idiopathic pulmonary fibrosis, atrial fibrillation EXAM: PORTABLE CHEST 1 VIEW COMPARISON:  Portable exam 1312 hours compared to 1138 hours FINDINGS: Tip of endotracheal tube projects 3.2 cm above carina. Stable heart size. Slightly rotated to the RIGHT which may account for prominence of the RIGHT hilum. Diffuse interstitial infiltrates compatible pulmonary fibrosis. No definite superimposed acute infiltrate, pleural effusion or pneumothorax. Bones demineralized. IMPRESSION: Pulmonary fibrosis. Endotracheal tube tip projects 3.2 cm above carina. Electronically Signed   By: Ulyses Southward M.D.   On: 09/03/2016 13:39   Dg Chest Port 1 View  Result Date: 09/03/2016 CLINICAL DATA:  Fever and altered mental status. EXAM: PORTABLE CHEST 1 VIEW COMPARISON:  03/07/2016 and CT 02/07/2014 FINDINGS: Patient slightly rotated to the right. Lungs are adequately inflated demonstrate diffuse predominant peripheral bilateral interstitial disease compatible with known pulmonary fibrosis. No focal confluent airspace disease. No definite effusion. Stable cardiomegaly. Moderate size hiatal hernia. Calcified plaque over the aortic arch. Remainder of the exam is unchanged. IMPRESSION: No acute cardiopulmonary disease. Evidence of known pulmonary fibrosis. Stable cardiomegaly. Aortic Atherosclerosis (ICD10-I70.0). Electronically Signed   By: Elberta Fortis M.D.   On: 09/03/2016 11:52    Dg Abd Portable 1v  Result Date: 09/04/2016 CLINICAL DATA:  Orogastric  tube placement EXAM: PORTABLE ABDOMEN - 1 VIEW COMPARISON:  Same day CT abdomen and pelvis FINDINGS: The tip and side ports in a gastric tube are seen below the left hemidiaphragm. There appears to be a large hiatal hernia causing leftward deviation of the gastric tube as it courses over the lower mediastinum. The heart is enlarged. There is aortic atherosclerosis with slight uncoiling of the thoracic aorta. Interstitial and alveolar airspace disease is noted at the lung bases left greater than right. The patient is status post the level kyphoplasty and what appears to be T12, L3 and L4. Numerous injection granulomas project over the buttocks bilaterally. IMPRESSION: 1. The tip and side port of a gastric tube are seen just below the left hemidiaphragm presumably in the stomach. There is a large hiatal hernia above the diaphragm. 2. There is cardiomegaly with aortic atherosclerosis and chronic interstitial and alveolar airspace disease. 3. Kyphoplasties of the lower thoracic and lumbar spine. Electronically Signed   By: David  Kwon M.D.   On: 09/04/2016 00:50   [place] Abdomen LimTollie Ethited Ruq  Result Date: 09/03/2016 CLINICAL DATA:  Abnormal CT.  Cholecystitis. EXAM: ULTRASOUND ABDOMEN LIMITED RIGHT UPPER QUADRANT COMPARISON:  09/03/2016 CT FINDINGS: Gallbladder: Diffuse thickening and distention of the gallbladder with the gallbladder wall measuring up to 6 mm in thickness. There is biliary sludge and layering calculi along the dependent wall. There is pericholecystic fluid. The patient is ventilated and therefore assessment for a sonographic Eulah PontMurphy sign could not be performed. Common bile duct: Diameter: 4.4 mm Liver: No space-occupying mass of the included liver. No intrahepatic ductal dilatation. Small amount of free fluid is seen adjacent to the edge of the liver. Hepatopetal main portal vein flow on color Doppler imaging. IMPRESSION:  Gallbladder distention with mural thickening, pericholecystic fluid, biliary sludge and calculi in keeping with changes of acute cholecystitis. Electronically Signed   By: Tollie Ethavid  Kwon M.D.   On: 09/03/2016 21:36    Labs:  CBC:  Recent Labs  09/03/16 1137 09/03/16 1204 09/04/16 0303 09/05/16 0352 09/06/16 0400  WBC 8.8  --  21.6* 14.5* 7.5  HGB 12.5 14.3 10.0* 8.8* 8.8*  HCT 38.9 42.0 30.7* 27.3* 27.3*  PLT 206  --  193 147* 136*    COAGS:  Recent Labs  11/05/15 1032 09/04/16 1100 09/05/16 0754  INR 1.43 2.67 1.97  APTT  --  41*  --     BMP:  Recent Labs  09/03/16 1137 09/03/16 1204 09/04/16 0303 09/05/16 0352 09/06/16 0400  NA 139 141 139 140 141  K 4.0 4.1 3.7 3.0* 3.7  CL 103 101 104 106 108  CO2 27  --  23 27 27   GLUCOSE 119* 114* 111* 79 86  BUN 19 25* 15 10 11   CALCIUM 8.5*  --  7.7* 7.9* 8.0*  CREATININE 0.83 0.70 0.81 0.66 0.71  GFRNONAA >60  --  >60 >60 >60  GFRAA >60  --  >60 >60 >60    LIVER FUNCTION TESTS:  Recent Labs  03/07/16 1533 09/03/16 1137 09/04/16 0303 09/05/16 0352  BILITOT 1.2 4.8* 5.4* 2.6*  AST 26 96* 211* 80*  ALT 14 93* 146* 85*  ALKPHOS 78 335* 348* 254*  PROT 7.5 8.2* 6.2* 5.3*  ALBUMIN 2.3* 3.1* 2.0* 2.0*    Assessment and Plan:  Perc chole drain placed 8/13 Will remain for 5-6 weeks at minimum Plan per [facility] If no surgery; IR will see pt in follow up in drain clinic  Electronically Signed: [email or web address],[email or web address],Henrine Hayter  A, PA-C 09/06/2016, 2:21 PM   I spent a total of 15 Minutes at the the patient's bedside AND on the patient's hospital floor or unit, greater than 50% of which was counseling/coordinating care for perc chole drain

## 2016-09-06 NOTE — Progress Notes (Signed)
Pharmacy Antibiotic Note--Follow Up   [name] [name] is a 83 y.o. female admitted on 09/03/2016 with MSSA, E coli and Klebsiella pneumonia bacteremia and cholecystitis.  Pharmacy has been consulted for Unasyn dosing. The patient was previously on zosyn and this has been continued as the susceptibility for E coli was reported today ans reported to be sensitive to Unasyn. A drain was placed yesterday and repeat blood cultures were drawn today.   Plan: Unasyn 3g IV q8 hours   Continue to follow clinical progression, culture results, LOT    Weight: 129 lb 6.6 oz (58.7 kg)  Temp (24hrs), Avg:97.6 F (36.4 C), Min:97.4 F (36.3 C), Max:97.8 F (36.6 C)   Recent Labs Lab 09/03/16 1137 09/03/16 1144 09/03/16 1204 09/03/16 1502 09/03/16 1535 09/03/16 1936 09/04/16 0303 09/05/16 0352 09/06/16 0400  WBC 8.8  --   --   --   --   --  21.6* 14.5* 7.5  CREATININE 0.83  --  0.70  --   --   --  0.81 0.66 0.71  LATICACIDVEN  --  3.15*  --  2.68* 2.2* 1.9  --   --   --     Estimated Creatinine Clearance: 48.5 mL/min (by C-G formula based on SCr of 0.71 mg/dL).    Allergies  Allergen Reactions  . Clindamycin Hcl Diarrhea  . Rofecoxib Other (See Comments)    "didn't agree with her" per daughter  . Celecoxib Other (See Comments)    "didn't agree with her" per daughter  . Sertraline Hcl Other (See Comments)    "didn't agree with her" per daughter    Antimicrobials this admission:  Vancomycin 8/11 x1 Ceftriaxone 8/11x1 Metronidazole 8/11x1 Zosyn 8/11>>8/14 Unasyn 8/14>>  Microbiology results: 8/11 BCx x2: GNR  >>BCID positive for MSSA, e-coli, and kleb pneumo, Ecoli sensitive to unasyn  8/11 UCx: negative  8/11 MRSA PCR: negative 8/13 body fluid (bile): pending  8/14 BCx x2: ordered   Thank you for allowing pharmacy to be a part of this patient's care.  Blake DivineShannon Cherl Gorney, Pharm.D. PGY1 Pharmacy Resident 09/06/2016 1:16 PM Main Pharmacy: [phone]

## 2016-09-07 ENCOUNTER — Inpatient Hospital Stay (HOSPITAL_COMMUNITY): Payer: Medicare Other

## 2016-09-07 DIAGNOSIS — Z4659 Encounter for fitting and adjustment of other gastrointestinal appliance and device: Secondary | ICD-10-CM

## 2016-09-07 DIAGNOSIS — Z452 Encounter for adjustment and management of vascular access device: Secondary | ICD-10-CM

## 2016-09-07 LAB — BASIC METABOLIC PANEL
ANION GAP: 9 (ref 5–15)
BUN: 8 mg/dL (ref 6–20)
CALCIUM: 8.3 mg/dL — AB (ref 8.9–10.3)
CO2: 25 mmol/L (ref 22–32)
CREATININE: 0.58 mg/dL (ref 0.44–1.00)
Chloride: 109 mmol/L (ref 101–111)
Glucose, Bld: 101 mg/dL — ABNORMAL HIGH (ref 65–99)
Potassium: 3.6 mmol/L (ref 3.5–5.1)
SODIUM: 143 mmol/L (ref 135–145)

## 2016-09-07 LAB — CULTURE, BLOOD (ROUTINE X 2)

## 2016-09-07 LAB — CBC WITH DIFFERENTIAL/PLATELET
BASOS ABS: 0 10*3/uL (ref 0.0–0.1)
BASOS PCT: 1 %
EOS ABS: 0.1 10*3/uL (ref 0.0–0.7)
Eosinophils Relative: 2 %
HCT: 30.6 % — ABNORMAL LOW (ref 36.0–46.0)
HEMOGLOBIN: 9.8 g/dL — AB (ref 12.0–15.0)
Lymphocytes Relative: 28 %
Lymphs Abs: 1.5 10*3/uL (ref 0.7–4.0)
MCH: 33 pg (ref 26.0–34.0)
MCHC: 32 g/dL (ref 30.0–36.0)
MCV: 103 fL — ABNORMAL HIGH (ref 78.0–100.0)
MONOS PCT: 12 %
Monocytes Absolute: 0.7 10*3/uL (ref 0.1–1.0)
NEUTROS PCT: 57 %
Neutro Abs: 3 10*3/uL (ref 1.7–7.7)
Platelets: 140 10*3/uL — ABNORMAL LOW (ref 150–400)
RBC: 2.97 MIL/uL — ABNORMAL LOW (ref 3.87–5.11)
RDW: 20.6 % — AB (ref 11.5–15.5)
WBC: 5.3 10*3/uL (ref 4.0–10.5)

## 2016-09-07 LAB — BLOOD CULTURE ID PANEL (REFLEXED)
ACINETOBACTER BAUMANNII: NOT DETECTED
CANDIDA ALBICANS: NOT DETECTED
CANDIDA GLABRATA: NOT DETECTED
CANDIDA PARAPSILOSIS: NOT DETECTED
CANDIDA TROPICALIS: NOT DETECTED
Candida krusei: NOT DETECTED
Carbapenem resistance: NOT DETECTED
ENTEROCOCCUS SPECIES: NOT DETECTED
Enterobacter cloacae complex: NOT DETECTED
Enterobacteriaceae species: NOT DETECTED
Escherichia coli: NOT DETECTED
HAEMOPHILUS INFLUENZAE: NOT DETECTED
Klebsiella oxytoca: NOT DETECTED
Klebsiella pneumoniae: NOT DETECTED
Listeria monocytogenes: NOT DETECTED
Methicillin resistance: NOT DETECTED
Neisseria meningitidis: NOT DETECTED
PROTEUS SPECIES: NOT DETECTED
Pseudomonas aeruginosa: NOT DETECTED
SERRATIA MARCESCENS: NOT DETECTED
Staphylococcus aureus (BCID): NOT DETECTED
Staphylococcus species: NOT DETECTED
Streptococcus agalactiae: NOT DETECTED
Streptococcus pneumoniae: NOT DETECTED
Streptococcus pyogenes: NOT DETECTED
Streptococcus species: NOT DETECTED
VANCOMYCIN RESISTANCE: NOT DETECTED

## 2016-09-07 MED ORDER — TRAMADOL HCL 50 MG PO TABS: 100.0000 mg | ORAL_TABLET | Freq: Four times a day (QID) | ORAL | Status: DC | PRN

## 2016-09-07 MED ORDER — METOPROLOL TARTRATE 5 MG/5ML IV SOLN
2.5000 mg | Freq: Four times a day (QID) | INTRAVENOUS | Status: DC
  Administered 2016-09-07 – 2016-09-08 (×4): 2.5 mg via INTRAVENOUS
  Filled 2016-09-07 (×5): qty 5

## 2016-09-07 MED ORDER — GABAPENTIN 100 MG PO CAPS
100.0000 mg | ORAL_CAPSULE | Freq: Every day | ORAL | Status: DC
  Administered 2016-09-07 – 2016-09-13 (×7): 100 mg via ORAL
  Filled 2016-09-07 (×7): qty 1

## 2016-09-07 MED ORDER — APIXABAN 2.5 MG PO TABS
2.5000 mg | ORAL_TABLET | Freq: Two times a day (BID) | ORAL | Status: DC
  Administered 2016-09-07 – 2016-09-12 (×12): 2.5 mg via ORAL
  Filled 2016-09-07 (×13): qty 1

## 2016-09-07 MED ORDER — TRAMADOL HCL 50 MG PO TABS
50.0000 mg | ORAL_TABLET | Freq: Four times a day (QID) | ORAL | Status: DC | PRN
  Administered 2016-09-08 – 2016-09-11 (×2): 50 mg via ORAL
  Filled 2016-09-07 (×2): qty 1

## 2016-09-07 MED ORDER — ESCITALOPRAM OXALATE 10 MG PO TABS
10.0000 mg | ORAL_TABLET | Freq: Every day | ORAL | Status: DC
  Administered 2016-09-07 – 2016-09-13 (×7): 10 mg via ORAL
  Filled 2016-09-07 (×7): qty 1

## 2016-09-07 MED ORDER — RISPERIDONE 0.5 MG PO TABS
0.5000 mg | ORAL_TABLET | Freq: Every day | ORAL | Status: DC
  Administered 2016-09-08 – 2016-09-12 (×5): 0.5 mg via ORAL
  Filled 2016-09-07 (×7): qty 1

## 2016-09-07 MED ORDER — PANTOPRAZOLE SODIUM 40 MG PO TBEC
40.0000 mg | DELAYED_RELEASE_TABLET | Freq: Every day | ORAL | Status: DC
  Administered 2016-09-07 – 2016-09-12 (×6): 40 mg via ORAL
  Filled 2016-09-07 (×6): qty 1

## 2016-09-07 NOTE — Progress Notes (Signed)
PHARMACY - PHYSICIAN COMMUNICATION CRITICAL VALUE ALERT - BLOOD CULTURE IDENTIFICATION (BCID)  Results for orders placed or performed during the hospital encounter of 09/03/16  Blood Culture ID Panel (Reflexed) (Collected: 09/06/2016  8:05 AM)  Result Value Ref Range   Enterococcus species NOT DETECTED NOT DETECTED   Vancomycin resistance NOT DETECTED NOT DETECTED   Listeria monocytogenes NOT DETECTED NOT DETECTED   Staphylococcus species NOT DETECTED NOT DETECTED   Staphylococcus aureus NOT DETECTED NOT DETECTED   Methicillin resistance NOT DETECTED NOT DETECTED   Streptococcus species NOT DETECTED NOT DETECTED   Streptococcus agalactiae NOT DETECTED NOT DETECTED   Streptococcus pneumoniae NOT DETECTED NOT DETECTED   Streptococcus pyogenes NOT DETECTED NOT DETECTED   Acinetobacter baumannii NOT DETECTED NOT DETECTED   Enterobacteriaceae species NOT DETECTED NOT DETECTED   Enterobacter cloacae complex NOT DETECTED NOT DETECTED   Escherichia coli NOT DETECTED NOT DETECTED   Klebsiella oxytoca NOT DETECTED NOT DETECTED   Klebsiella pneumoniae NOT DETECTED NOT DETECTED   Proteus species NOT DETECTED NOT DETECTED   Serratia marcescens NOT DETECTED NOT DETECTED   Carbapenem resistance NOT DETECTED NOT DETECTED   Haemophilus influenzae NOT DETECTED NOT DETECTED   Neisseria meningitidis NOT DETECTED NOT DETECTED   Pseudomonas aeruginosa NOT DETECTED NOT DETECTED   Candida albicans NOT DETECTED NOT DETECTED   Candida glabrata NOT DETECTED NOT DETECTED   Candida krusei NOT DETECTED NOT DETECTED   Candida parapsilosis NOT DETECTED NOT DETECTED   Candida tropicalis NOT DETECTED NOT DETECTED     Changes to prescribed antibiotics required: No rapid ID, awaiting final report.  Vernard GamblesVeronda Donnelle Rubey, PharmD, BCPS  09/07/2016  6:38 AM

## 2016-09-07 NOTE — Progress Notes (Signed)
      INFECTIOUS DISEASE ATTENDING ADDENDUM:   Date: 09/07/2016  Patient name: [name] MirzaGloria G Baillie  Medical record number: [id number]  Date of birth: 02/24/1930    The patient was down for speech evaluation when I came to round on her. I was informed that her central line has been discontinued.  I am checking post central line cultures to document clearing of bacteremia with the central line gone  I would treat her with 2 weeks of unasyn  that would cover both Escherichia coli and MSSA that was identified by the BCID  Day #1 would be today if her cultures remain no growth  I would not place central line for another 3-4 days and then only if cultures from today remain negative.  IV antibiotic plan is as follows again presuming the cultures from today are negative.  Diagnosis:  Escherichia coli and MSSA bacteremia  Culture Result: Escherichia coli and MSSA   Allergies  Allergen Reactions  . Clindamycin Hcl Diarrhea  . Rofecoxib Other (See Comments)    "didn't agree with her" per daughter  . Celecoxib Other (See Comments)    "didn't agree with her" per daughter  . Sertraline Hcl Other (See Comments)    "didn't agree with her" per daughter    OPAT Orders Discharge antibiotics: IV unasyn per pharmacy   Duration: 2 weeks End Date:  August 28th, 2018.  [facility]:  Labs weekly while on IV antibiotics: _x_ CBC with differential _x_ BMP  _x_ Please pull PIC at completion of IV antibiotics __ Please leave PIC in place until doctor has seen patient or been notified   Start augmentin 875/125 po BID on August 29th and continue until drain comes out  Fax weekly labs to [phone]  Clinic followup with us in the next 3 weeks  I will sign off for now  Please call with further questions.      Acey LavCornelius Van Dam 09/07/2016, 5:29 PM

## 2016-09-07 NOTE — Progress Notes (Signed)
PULMONARY / CRITICAL CARE MEDICINE   Name: [name] [name] MRN: [id number] DOB: May 01, 1930    ADMISSION DATE:  09/03/2016  CHIEF COMPLAINT:  Fever and Altered Mental status  HISTORY OF PRESENT ILLNESS:  83 y.o. female who at baseline suffers from moderate dementia manifested as memory loss, frequent falls and indifference to food. She was brought to the due to decreased mentation and was found to have a rectal temperature greater than 103. She has reportedly has not had cough dyspnea or abdominal pain. She has been incontinent of urine, and has been having back pain since a recent fall. In the ER she was felt to be apneic and was intubated. She has become hypotensive and received 3 liters of crystalloid, she remains hypotensive and low dose levophed has been added via peripheral IV.  SUBJECTIVE: SLP No pressors   VITAL SIGNS: BP (!) 100/48   Pulse (!) 147   Temp 97.8 F (36.6 C) (Axillary)   Resp 19   Wt 58.6 kg (129 lb 3 oz)   SpO2 100%   BMI 20.23 kg/m   HEMODYNAMICS: CVP:  [14 mmHg] 14 mmHg  VENTILATOR SETTINGS:    INTAKE / OUTPUT: I/O last 3 completed shifts: In: 1610 [P.O.:240; I.V.:970; IV Piggyback:400] Out: 520 [Urine:50; Drains:470]  PHYSICAL EXAMINATION: General: no distress Neuro: alert, more awake, confused chornic HEENT: jvd wnl PULM: coarse left CV: s1 s2 IRT GI: soft, bs wnl, no r, drian wnl Extremities: no edema     LABS:  BMET  Recent Labs Lab 09/05/16 0352 09/06/16 0400 09/07/16 0300  NA 140 141 143  K 3.0* 3.7 3.6  CL 106 108 109  CO2 27 27 25   BUN 10 11 8   CREATININE 0.66 0.71 0.58  GLUCOSE 79 86 101*    Electrolytes  Recent Labs Lab 09/04/16 1100 09/05/16 0352 09/06/16 0400 09/07/16 0300  CALCIUM  --  7.9* 8.0* 8.3*  MG 1.7 2.2 1.9  --   PHOS  --  2.2* 2.3*  --     CBC  Recent Labs Lab 09/05/16 0352 09/06/16 0400 09/07/16 0300  WBC 14.5* 7.5 5.3  HGB 8.8* 8.8* 9.8*  HCT 27.3* 27.3* 30.6*  PLT 147* 136* 140*     Coag's  Recent Labs Lab 09/04/16 1100 09/05/16 0754  APTT 41*  --   INR 2.67 1.97    Sepsis Markers  Recent Labs Lab 09/03/16 1502  09/03/16 1535 09/03/16 1936 09/04/16 1100 09/05/16 0352 09/06/16 0400  LATICACIDVEN 2.68*  --  2.2* 1.9  --   --   --   PROCALCITON  --   < > 5.97  --  6.76 4.27 2.59  < > = values in this interval not displayed.  ABG  Recent Labs Lab 09/03/16 1431 09/04/16 0408  PHART 7.380 7.383  PCO2ART 49.6* 45.1  PO2ART 446.0* 163*    Liver Enzymes  Recent Labs Lab 09/03/16 1137 09/04/16 0303 09/05/16 0352  AST 96* 211* 80*  ALT 93* 146* 85*  ALKPHOS 335* 348* 254*  BILITOT 4.8* 5.4* 2.6*  ALBUMIN 3.1* 2.0* 2.0*    Cardiac Enzymes  Recent Labs Lab 09/03/16 1535 09/03/16 1936 09/04/16 0303  TROPONINI 0.06* 0.05* 0.04*    Glucose  Recent Labs Lab 09/04/16 1629 09/04/16 2024 09/05/16 0045 09/05/16 0411 09/06/16 0606 09/06/16 0622  GLUCAP 89 77 80 73 66 143*    Imaging No results found.  IMAGING/STUDIES: CT HEAD W/O 8/11:  Prominent atrophy with minimal small vessel chronic ischemic changes  of deep cerebral white matter. No acute intracranial abnormalities. CT ABDOMEN/PELVIS 8/11: IMPRESSION: 1. Findings compatible with cystic duct obstruction with marked changes of acute cholecystitis. A right upper quadrant abdomen ultrasound may provide useful additional information. 2. Small amount of free peritoneal fluid. 3. Diffuse hepatic steatosis and transient enhancement phenomenon in the right lobe. 4. Interval atelectasis or pneumonia in both lower lobes, greater on the right with stable underlying interstitial fibrosis and peripheral honeycombing. 5. Extensive dense calcified coronary artery atherosclerosis. 6. Moderately large hiatal hernia. RUQ U/S 8/11:  Gallbladder distention with mural thickening, pericholecystic fluid, biliary sludge and calculi in keeping with changes of acute cholecystitis. TTE 8/12  >>>  MICROBIOLOGY: MRSA PCR 8/11:  Negative Urine Culture 8/11 >>> Blood Cultures x2 8/11 >>> 2/2 Positive GNRs Rapid ID>>> MSSA, e coli ( pan sens), Kleb>>> Chole drain 8/13>>> BC 8/14>>>  ANTIBIOTICS: Rocephin 8/11 (x1 dose) Flagyl 8/11 (x1 dose) Zosyn 8/11 >>>8/14 Vancomycin 8/11 >>>off unasyn 8/14>>>  LINES/TUBES: OETT 8/11 - 8/12 (self-extubation) L [place] CVL 8/11 >>>8/14 Foley 8/11 >>> PIV Chole 8/13>>>  SIGNIFICANT EVENTS: 08/11 - Admit 08/12 - Self-extubation in AM 8/13- drain placed  ASSESSMENT / PLAN:  PULMONARY A: Acute Hypoxic Respiratory Failure:  Self-extubated 8/12. Improving. H/O IPF:  Has known PM.  H/O OSA  P:   IS mobilize  CARDIOVASCULAR A:  Shock:  Likely due to septic shock H/O Systolic CHF H/O Atrial Fibrillation with some int rate increase (r/o BB WD) H/O Hyperlipidemia H/O Essential Hypertension  P:  Even balance Dc tele Add low dose BB, if BP drop, use dig  RENAL A:   ARF resolving  P:   kvo  GASTROINTESTINAL A:   Acute Calculous Cholecystitis - post drain 8/14 H/O GERD R/o dysphagia P:   slp needed, done, for objective  Protonix IV qhs  HEMATOLOGIC A:   Leukocytosis:  Secondary to sepsis. H/O Anemia Chronic NOAC:  Eliquis for Atrial Fibrillation. ffp for drain given  P:  Consider re add Eliquis scd  INFECTIOUS A:   Severe Sepsis Acute Calculous Cholecystitis Gram Negative Rod Bacteremia  P:   unasyn , establish stop date Follow klieb Do we cover mssa  ENDOCRINE A:   H/O Hypothyroidism:  TSH 0.751 & Total T4 6.0.  P:   Holding Synthroid  NEUROLOGIC A:   Acute Encephalopathy:  improved Questionable H/O Dementia H/O Depression  P:   When able to take oral add home Lexapro, Risperdal, Neurontin, & Ultram   Mcarthur RossettiDaniel J. Tyson AliasFeinstein, MD, FACP Pgr: [phone] [facility]

## 2016-09-07 NOTE — Progress Notes (Signed)
Patient ID: [name] [name], female   DOB: [birth year], 83 y.o.   MRN: [id number]    Referring Physician(s): Dr. Rory Percy  Supervising Physician: Gilmer Mor  Patient Status: [facility] - In-pt  Chief Complaint: Acute cholecystitis  Subjective: Patient with no complaints.  No abdominal pain.  Allergies: Clindamycin hcl; Rofecoxib; Celecoxib; and Sertraline hcl  Medications: Prior to Admission medications   Medication Sig Start Date End Date Taking? Authorizing Provider  acetaminophen (TYLENOL) 500 MG tablet Take 1,000 mg by mouth every 12 (twelve) hours as needed (moderate to severe pain).    Yes [provider]  carvedilol (COREG) 3.125 MG tablet TAKE 1 TABLET TWICE DAILY. Patient taking differently: TAKE 1 TABLET (3.125 MG) BY MOUTH TWICE DAILY. 02/02/16  Yes Nelwyn Salisbury, MD  ELIQUIS 2.5 MG TABS tablet TAKE 1 TABLET TWICE DAILY. Patient taking differently: TAKE 1 TABLET(2.5 MG) BY MOUTH TWICE DAILY. 03/21/16  Yes Nelwyn Salisbury, MD  ENSURE (ENSURE) Take by mouth 2 (two) times daily.   Yes [provider]  escitalopram (LEXAPRO) 10 MG tablet Take 1 tablet (10 mg total) by mouth daily. 02/03/16  Yes Nelwyn Salisbury, MD  ferrous sulfate 325 (65 FE) MG tablet TAKE 1 TABLET TWICE DAILY. Patient taking differently: TAKE 1 TABLET (325 MG) BY MOUTH TWICE DAILY. 03/29/16  Yes Nelwyn Salisbury, MD  furosemide (LASIX) 40 MG tablet Take 1 tablet (40 mg total) by mouth daily. 11/24/15  Yes Johnson, Clanford L, MD  gabapentin (NEURONTIN) 100 MG capsule TAKE 1 CAPSULE EVERY DAY. Patient taking differently: TAKE 1 CAPSULE (100 MG) BY MOUTH EVERY DAY 02/22/16  Yes Nelwyn Salisbury, MD  levothyroxine (SYNTHROID, LEVOTHROID) 75 MCG tablet Take 75 mcg by mouth daily before breakfast.   Yes [provider]  magnesium oxide (MAG-OX) 400 MG tablet Take 800 mg by mouth daily. For constipation   Yes [provider]  pantoprazole (PROTONIX) 40 MG tablet Take 1 tablet (40 mg  total) by mouth daily. 05/15/15  Yes Azalee Course, PA  potassium chloride (KLOR-CON) 20 MEQ packet Take 20 mEq by mouth 2 (two) times daily. 02/03/16  Yes Nelwyn Salisbury, MD  risperiDONE (RISPERDAL) 0.5 MG tablet Once daily at suppertime Patient taking differently: 0.5 mg daily with supper.  08/30/16  Yes Nelwyn Salisbury, MD  traMADol (ULTRAM) 50 MG tablet Take 2 tablets (100 mg total) by mouth every 6 (six) hours as needed for moderate pain. Patient taking differently: Take 100 mg by mouth every 6 (six) hours as needed for moderate pain or severe pain.  08/30/16  Yes Nelwyn Salisbury, MD    Vital Signs: BP (!) 100/48   Pulse (!) 147   Temp 97.8 F (36.6 C) (Axillary)   Resp 19   Wt 129 lb 3 oz (58.6 kg)   SpO2 100%   BMI 20.23 kg/m   Physical Exam: Abd: soft, NT, ND, perc chole drain in place with bilious output.  Drain site is c/d/i  Imaging: Ct Head Wo Contrast  Result Date: 09/03/2016 CLINICAL DATA:  Found down at nursing home, respiratory distress, altered level of consciousness, history hypertension, dementia, CHF, pulmonary fibrosis EXAM: CT HEAD WITHOUT CONTRAST TECHNIQUE: Contiguous axial images were obtained from the base of the skull through the vertex without intravenous contrast. Sagittal and coronal MPR images reconstructed from axial data set. COMPARISON:  03/07/2016 FINDINGS: Brain: Generalized atrophy. Normal ventricular morphology. No midline shift or mass effect. Minimal small vessel chronic ischemic changes of deep  cerebral white matter. No intracranial hemorrhage, mass lesion, evidence of acute infarction, or extra-axial fluid collection. Vascular: Atherosclerotic calcification of internal carotid arteries at skullbase Skull: Intact Sinuses/Orbits: Clear.  Dependent fluid in the nasopharynx Other: N/A IMPRESSION: Prominent atrophy with minimal small vessel chronic ischemic changes of deep cerebral white matter. No acute intracranial abnormalities. Electronically Signed   By: Ulyses Southward M.D.   On: 09/03/2016 16:45   Ct Abdomen Pelvis W Contrast  Result Date: 09/03/2016 CLINICAL DATA:  Abdominal pain.  Respiratory distress. EXAM: CT ABDOMEN AND PELVIS WITH CONTRAST TECHNIQUE: Multidetector CT imaging of the abdomen and pelvis was performed using the standard protocol following bolus administration of intravenous contrast. CONTRAST:  80mL ISOVUE-300 IOPAMIDOL (ISOVUE-300) INJECTION 61% COMPARISON:  Chest CT dated 02/07/2014. Right upper quadrant abdomen ultrasound dated 11/20/2015. FINDINGS: Lower chest: Again demonstrated are fibrotic changes with peripheral honeycombing at both lung bases. There is interval increased density in the posterior aspects of both lower lobes, greater on the right. No pleural fluid seen. Moderately large hiatal hernia. Enlarged heart. Small pericardial effusion with a maximum thickness of 11 mm. Extensive atheromatous coronary artery calcifications. Hepatobiliary: Interval diffuse low density of the liver relative to the spleen. There is a linear branching area of increased enhancement in the right lobe of the liver on the initial images, not seen on the delayed images. There is extensive pericholecystic fluid and soft tissue stranding. Also demonstrated is marked dilatation of the cystic duct with mucosal enhancement and dilatation of the gallbladder with mucosal enhancement. No calcified gallstones visualized. No gallstones visualized on the previous ultrasound. Pancreas: Pancreatic atrophy. Spleen: Normal in size without focal abnormality. Adrenals/Urinary Tract: Adrenal glands are unremarkable. Kidneys are normal, without renal calculi, focal lesion, or hydronephrosis. Bladder is unremarkable. Stomach/Bowel: Moderately large hiatal hernia. No small bowel or colonic abnormalities demonstrated. No evidence of appendicitis, surgically absent by history. Vascular/Lymphatic: Arterial calcifications, including dense coronary artery calcifications. No aneurysm or  enlarged lymph nodes. Reproductive: Small uterus.  No adnexal masses. Other: Small amount of free peritoneal fluid. No abdominal wall hernia seen. Musculoskeletal: Multiple lumbar and lower thoracic vertebral compression deformities, some with kyphoplasty material. No acute fracture lines seen. Mild bilateral hip degenerative changes. IMPRESSION: 1. Findings compatible with cystic duct obstruction with marked changes of acute cholecystitis. A right upper quadrant abdomen ultrasound may provide useful additional information. 2. Small amount of free peritoneal fluid. 3. Diffuse hepatic steatosis and transient enhancement phenomenon in the right lobe. 4. Interval atelectasis or pneumonia in both lower lobes, greater on the right with stable underlying interstitial fibrosis and peripheral honeycombing. 5. Extensive dense calcified coronary artery atherosclerosis. 6. Moderately large hiatal hernia. Electronically Signed   By: Beckie Salts M.D.   On: 09/03/2016 17:10   Ir Perc Cholecystostomy  Result Date: 09/05/2016 INDICATION: 83 year old with acute cholecystitis. Sepsis and needs percutaneous drainage of the gallbladder. EXAM: PERCUTANEOUS CHOLECYSTOSTOMY TUBE PLACEMENT WITH ULTRASOUND AND FLUOROSCOPIC GUIDANCE MEDICATIONS: None ANESTHESIA/SEDATION: Fentanyl 25 mcg FLUOROSCOPY TIME:  Fluoroscopy Time: 1 minutes, 23 mGy COMPLICATIONS: None immediate. PROCEDURE: Informed written consent was obtained from the patient after a thorough discussion of the procedural risks, benefits and alternatives. All questions were addressed. Maximal Sterile Barrier Technique was utilized including caps, mask, sterile gowns, sterile gloves, sterile drape, hand hygiene and skin antiseptic. A timeout was performed prior to the initiation of the procedure. Gallbladder was identified with ultrasound. The right side of the abdomen was prepped and draped in sterile fashion. Skin was anesthetized with 1% lidocaine.  22 gauge Chiba needle was  directed into the gallbladder with ultrasound guidance. 0.018 wire was advanced into the gallbladder. Accustick dilator set was placed. J wire was advanced to the gallbladder. The tract was dilated to accommodate a 10 [place] multipurpose drain. Approximately 50 mL of brown fluid was removed from the gallbladder. Fluid was sent for culture. Catheter was sutured to skin. Drain was attached to gravity bag. Fluoroscopic and ultrasound images were taken and saved for documentation. FINDINGS: Dilated gallbladder with thick wall and a small amount of surrounding fluid. Tube was placed directly into the gallbladder. A transhepatic approach was not used due to the patient's anatomy. The gallbladder was decompressed at the end of the procedure. IMPRESSION: Successful percutaneous cholecystostomy tube placement with ultrasound and fluoroscopic guidance. Electronically Signed   By: Richarda OverlieAdam  Henn M.D.   On: 09/05/2016 18:42   Dg Chest Port 1 View  Result Date: 09/03/2016 CLINICAL DATA:  83 year old female under evaluation for are central line placement. EXAM: PORTABLE CHEST 1 VIEW COMPARISON:  Chest x-ray 09/03/2016. FINDINGS: New left upper extremity PICC with tip terminating in the distal superior vena cava. An endotracheal tube is in place with tip 5.1 cm above the carina. Lung volumes are low. Diffuse interstitial prominence throughout the lungs bilaterally, most evident throughout the mid to lower lungs, compatible with underlying interstitial lung disease. No definite acute consolidative airspace disease. Lateral pleural fluid or pleural thickening noted in the right mid hemithorax. No definite left pleural effusion. No evidence of pulmonary edema. Heart size is borderline enlarged. Large hiatal hernia. Upper mediastinal contours are distorted by patient positioning. Aortic atherosclerosis. IMPRESSION: 1. Support apparatus, as above. 2. Chronic changes of interstitial lung disease, similar to prior studies, presumably  from underlying usual interstitial pneumonia (UIP). 3. Lateral pleural thickening or lateral loculated pleural fluid in the right mid hemithorax. 4. Aortic atherosclerosis. 5. Large hiatal hernia. Electronically Signed   By: Trudie Reedaniel  Entrikin M.D.   On: 09/03/2016 19:29   Dg Chest Portable 1 View  Result Date: 09/03/2016 CLINICAL DATA:  Intubation, hypertension, CHF, idiopathic pulmonary fibrosis, atrial fibrillation EXAM: PORTABLE CHEST 1 VIEW COMPARISON:  Portable exam 1312 hours compared to 1138 hours FINDINGS: Tip of endotracheal tube projects 3.2 cm above carina. Stable heart size. Slightly rotated to the RIGHT which may account for prominence of the RIGHT hilum. Diffuse interstitial infiltrates compatible pulmonary fibrosis. No definite superimposed acute infiltrate, pleural effusion or pneumothorax. Bones demineralized. IMPRESSION: Pulmonary fibrosis. Endotracheal tube tip projects 3.2 cm above carina. Electronically Signed   By: Ulyses SouthwardMark  Boles M.D.   On: 09/03/2016 13:39   Dg Abd Portable 1v  Result Date: 09/04/2016 CLINICAL DATA:  Orogastric tube placement EXAM: PORTABLE ABDOMEN - 1 VIEW COMPARISON:  Same day CT abdomen and pelvis FINDINGS: The tip and side ports in a gastric tube are seen below the left hemidiaphragm. There appears to be a large hiatal hernia causing leftward deviation of the gastric tube as it courses over the lower mediastinum. The heart is enlarged. There is aortic atherosclerosis with slight uncoiling of the thoracic aorta. Interstitial and alveolar airspace disease is noted at the lung bases left greater than right. The patient is status post the level kyphoplasty and what appears to be T12, L3 and L4. Numerous injection granulomas project over the buttocks bilaterally. IMPRESSION: 1. The tip and side port of a gastric tube are seen just below the left hemidiaphragm presumably in the stomach. There is a large hiatal hernia above the diaphragm. 2. There  is cardiomegaly with aortic  atherosclerosis and chronic interstitial and alveolar airspace disease. 3. Kyphoplasties of the lower thoracic and lumbar spine. Electronically Signed   By: Tollie Eth M.D.   On: 09/04/2016 00:50   US Abdomen Limited Ruq  Result Date: 09/03/2016 CLINICAL DATA:  Abnormal CT.  Cholecystitis. EXAM: ULTRASOUND ABDOMEN LIMITED RIGHT UPPER QUADRANT COMPARISON:  09/03/2016 CT FINDINGS: Gallbladder: Diffuse thickening and distention of the gallbladder with the gallbladder wall measuring up to 6 mm in thickness. There is biliary sludge and layering calculi along the dependent wall. There is pericholecystic fluid. The patient is ventilated and therefore assessment for a sonographic Eulah Pont sign could not be performed. Common bile duct: Diameter: 4.4 mm Liver: No space-occupying mass of the included liver. No intrahepatic ductal dilatation. Small amount of free fluid is seen adjacent to the edge of the liver. Hepatopetal main portal vein flow on color Doppler imaging. IMPRESSION: Gallbladder distention with mural thickening, pericholecystic fluid, biliary sludge and calculi in keeping with changes of acute cholecystitis. Electronically Signed   By: Tollie Eth M.D.   On: 09/03/2016 21:36    Labs:  CBC:  Recent Labs  09/04/16 0303 09/05/16 0352 09/06/16 0400 09/07/16 0300  WBC 21.6* 14.5* 7.5 5.3  HGB 10.0* 8.8* 8.8* 9.8*  HCT 30.7* 27.3* 27.3* 30.6*  PLT 193 147* 136* 140*    COAGS:  Recent Labs  11/05/15 1032 09/04/16 1100 09/05/16 0754  INR 1.43 2.67 1.97  APTT  --  41*  --     BMP:  Recent Labs  09/04/16 0303 09/05/16 0352 09/06/16 0400 09/07/16 0300  NA 139 140 141 143  K 3.7 3.0* 3.7 3.6  CL 104 106 108 109  CO2 23 27 27 25   GLUCOSE 111* 79 86 101*  BUN 15 10 11 8   CALCIUM 7.7* 7.9* 8.0* 8.3*  CREATININE 0.81 0.66 0.71 0.58  GFRNONAA >60 >60 >60 >60  GFRAA >60 >60 >60 >60    LIVER FUNCTION TESTS:  Recent Labs  03/07/16 1533 09/03/16 1137 09/04/16 0303  09/05/16 0352  BILITOT 1.2 4.8* 5.4* 2.6*  AST 26 96* 211* 80*  ALT 14 93* 146* 85*  ALKPHOS 78 335* 348* 254*  PROT 7.5 8.2* 6.2* 5.3*  ALBUMIN 2.3* 3.1* 2.0* 2.0*    Assessment and Plan: 1. Acute cholecystitis, s/p perc chole drain  Drain needs to remain in place for at least 6-8 weeks. She will need to returned to drain clinic around 5 weeks after placement for a cholangiogram.  Determination of what to do with the drain will be made at that time as she is not a surgical candidate for cholecystectomy.  Electronically Signed: Letha Cape 09/07/2016, 12:10 PM   I spent a total of 15 Minutes at the the patient's bedside AND on the patient's hospital floor or unit, greater than 50% of which was counseling/coordinating care for acute cholecystitis

## 2016-09-07 NOTE — Evaluation (Signed)
Clinical/Bedside Swallow Evaluation Patient Details  Name: [name] MirzaGloria G Tingley MRN: [id number] Date of Birth: 01/19/1931  Today's Date: 09/07/2016 Time: SLP Start Time (ACUTE ONLY): 0955 SLP Stop Time (ACUTE ONLY): 1010 SLP Time Calculation (min) (ACUTE ONLY): 15 min  Past Medical History:  Past Medical History:  Diagnosis Date  . Anemia   . Anxiety   . Arthritis   . Atrial fibrillation (HCC)   . CHF (congestive heart failure) (HCC)   . Dementia   . Depression   . Diverticulosis of colon   . History of alcohol abuse   . Hyperlipidemia   . Hypertension   . IPF (idiopathic pulmonary fibrosis) (HCC)   . Irritable bowel syndrome   . OSA (obstructive sleep apnea)   . Osteoporosis   . PMR (polymyalgia rheumatica) (HCC)   . Postherpetic neuralgia   . Spinal stenosis   . Thyroid disease   . Vertebral fracture   . Vitamin D deficiency    Past Surgical History:  Past Surgical History:  Procedure Laterality Date  . APPENDECTOMY    . BREAST BIOPSY    . CATARACT EXTRACTION    . DILATION AND CURETTAGE OF UTERUS     x several  . IR PERC CHOLECYSTOSTOMY  09/05/2016  . KNEE ARTHROSCOPY  10/2007 and 10/2010   left  . REPLACEMENT TOTAL KNEE  august 2012  . ROTATOR CUFF REPAIR    . VERTEBROPLASTY     x 3   HPI:  Pt is an 83 y.o.female who at baseline suffers from moderate dementia, manifested as memory loss, frequent falls and indifference to food. She was brought in for fever and AMS felt to be related to acute calculous cholecystitis s/p drain placement 8/13. She was intubated 8/11-8/12 when she self-extubated. She was started on clear liquids 8/14 but was observed to be coughing. She was made NPO pending SLP evaluation. PMH also includes: OSA, HTN, HLD, h/o alcohol abuse, depression, CHF, a fib, anxiety   Assessment / Plan / Recommendation Clinical Impression  Although pt does not show overt signs of aspiration, signs of dysphagia include multiple, audible subswallows per bolus and  frequent eructation despite consistency presented. Suspect a possible esophageal and/or structural component that may be more chronic. Recommend proceeding with MBS to better assess oropharyngeal function prior to initiating a diet. She could have meds crushed in puree and sips of water prior to testing, scheduled for later today. SLP Visit Diagnosis: Dysphagia, unspecified (R13.10)    Aspiration Risk  Mild aspiration risk;Moderate aspiration risk    Diet Recommendation NPO except meds;Free water protocol after oral care   Medication Administration: Crushed with puree    Other  Recommendations Oral Care Recommendations: Oral care QID   Follow up Recommendations  (tba)      Frequency and Duration            Prognosis Prognosis for Safe Diet Advancement: Good      Swallow Study   General HPI: Pt is an 83 y.o.female who at baseline suffers from moderate dementia, manifested as memory loss, frequent falls and indifference to food. She was brought in for fever and AMS felt to be related to acute calculous cholecystitis s/p drain placement 8/13. She was intubated 8/11-8/12 when she self-extubated. She was started on clear liquids 8/14 but was observed to be coughing. She was made NPO pending SLP evaluation. PMH also includes: OSA, HTN, HLD, h/o alcohol abuse, depression, CHF, a fib, anxiety Type of Study: Bedside Swallow Evaluation Previous Swallow  Assessment: none in chart Diet Prior to this Study: NPO Temperature Spikes Noted: No Respiratory Status: Nasal cannula History of Recent Intubation: Yes Length of Intubations (days): 1 days (self-extubated) Date extubated: 09/04/16 Behavior/Cognition: Alert;Cooperative;Pleasant mood Oral Cavity Assessment: Within Functional Limits Oral Care Completed by SLP: No Oral Cavity - Dentition: Adequate natural dentition Vision: Functional for self-feeding Self-Feeding Abilities: Able to feed self Patient Positioning: Upright in bed Baseline  Vocal Quality: Normal    Oral/Motor/Sensory Function     Ice Chips Ice chips: Within functional limits Presentation: Spoon   Thin Liquid Thin Liquid: Impaired Presentation: Cup;Self Fed;Straw Pharyngeal  Phase Impairments: Multiple swallows    Nectar Thick Nectar Thick Liquid: Not tested   Honey Thick Honey Thick Liquid: Not tested   Puree Puree: Impaired Presentation: Spoon Pharyngeal Phase Impairments: Multiple swallows   Solid   GO   Solid: Not tested        Maxcine Ham 09/07/2016,11:46 AM   Maxcine Ham, M.A. CCC-SLP [phone]

## 2016-09-07 NOTE — Progress Notes (Signed)
Pt transferred to 5W03. Pt oriented to unit. Pt comfortable and call bell is within reach. Will continue to assess.

## 2016-09-07 NOTE — Progress Notes (Signed)
[facility] LAB RE[facility]ACEMENT ONLY  The patient does not apply for the [facility] Adult [facility] Electrolyte Replacment Protocol based on the criteria listed below:     Is urine output >/= 0.5 ml/kg/hr for the last 6 hours? No. Patient's UOP is 0.1 ml/kg/hr  Abnormal electrolyte(s): K3.6  If a panic level lab has been reported, has the CCM MD in charge been notified? Yes.  .   Physician:  Elisabeth Cara Alva, MD  [name] NakayamaChisholm, [name] [name] William 09/07/2016 5:26 AM

## 2016-09-07 NOTE — Progress Notes (Signed)
Modified Barium Swallow Progress Note  Patient Details  Name: [name] [name] MRN: [id number] Date of Birth: 06/21/1930  Today's Date: 09/07/2016  Modified Barium Swallow completed.  Full report located under Chart Review in the Imaging Section.  Brief recommendations include the following:  Clinical Impression  Pt has a mild pharyngeal dysphagia characterized by base of tongue weakness that allows trace to mild amounts of residue to remain in the vallecula post-swallow. She did need multiple liquid washes to clear the barium tablet from her vallecula before it would clear her pharynx, and an additional pureed bolus was administered to facilitate clearance through the esophagus (MD not present to confirm slower transit). She had adequate airway protection with only intermittent flash penetration observed with thin liquids, which is not atypical given her age. Recommend regular diet textures and thin liquids with meds whole in puree. SLP will f/u briefly for tolerance.   Swallow Evaluation Recommendations       SLP Diet Recommendations: Regular solids;Thin liquid   Liquid Administration via: Cup;Straw   Medication Administration: Whole meds with puree   Supervision: Patient able to self feed;Intermittent supervision to cue for compensatory strategies   Compensations: Slow rate;Small sips/bites;Follow solids with liquid   Postural Changes: Remain semi-upright after after feeds/meals (Comment);Seated upright at 90 degrees   Oral Care Recommendations: Oral care BID        Maxcine Hamaiewonsky, Apolonio Cutting 09/07/2016,4:04 PM   Maxcine HamLaura Paiewonsky, M.A. CCC-SLP [phone]

## 2016-09-08 DIAGNOSIS — K8000 Calculus of gallbladder with acute cholecystitis without obstruction: Secondary | ICD-10-CM

## 2016-09-08 DIAGNOSIS — A4101 Sepsis due to Methicillin susceptible Staphylococcus aureus: Secondary | ICD-10-CM

## 2016-09-08 DIAGNOSIS — F0390 Unspecified dementia without behavioral disturbance: Secondary | ICD-10-CM

## 2016-09-08 DIAGNOSIS — R7881 Bacteremia: Secondary | ICD-10-CM

## 2016-09-08 LAB — CULTURE, BLOOD (ROUTINE X 2): Special Requests: ADEQUATE

## 2016-09-08 MED ORDER — LEVOTHYROXINE SODIUM 75 MCG PO TABS
75.0000 ug | ORAL_TABLET | Freq: Every day | ORAL | Status: DC
  Administered 2016-09-09 – 2016-09-13 (×5): 75 ug via ORAL
  Filled 2016-09-08 (×5): qty 1

## 2016-09-08 MED ORDER — CARVEDILOL 3.125 MG PO TABS
3.1250 mg | ORAL_TABLET | Freq: Two times a day (BID) | ORAL | Status: DC
  Administered 2016-09-08 – 2016-09-13 (×11): 3.125 mg via ORAL
  Filled 2016-09-08 (×11): qty 1

## 2016-09-08 NOTE — Care Management Important Message (Signed)
Important Message  Patient Details  Name: Kirstie MirzaGloria G Ohlendorf MRN: [id number] Date of Birth: 10/15/1930   Medicare Important Message Given:  Yes    Kyla BalzarineShealy, Celise Bazar Abena 09/08/2016, 10:43 AM

## 2016-09-08 NOTE — Progress Notes (Signed)
Patient ID: [name] [name], female   DOB: May 13, 1930, 83 y.o.   MRN: [id number]  PROGRESS NOTE    ARTHUR AYDELOTTE  ZWC:[id number] DOB: September 30, 1930 DOA: 09/03/2016 PCP: Laurey Morale, MD   Brief Narrative:  83 year old female with history of moderate dementia, chronic atrial fibrillation on anticoagulation, CHF, hypertension, hyperlipidemia presented on 09/03/2016 with fever and altered mental status and was admitted to ICU with septic shock and started on broad-spectrum antibiotics and intravenous Levophed. Patient was found to have acute cholecystitis for which general surgery was consulted. Patient was thought to be a poor candidate for surgery so patient underwent percutaneous biliary drain placed by interventional radiology. Patient initially was intubated for respiratory failure but patient self extubated. Blood cultures grew Escherichia coli and MSSA; ID was consulted. Central line was removed. Repeat blood cultures from 09/07/2016 are pending. Patient was transferred out of ICU  Assessment & Plan:   Active Problems:   Septic shock (HCC)   Acute respiratory failure with hypoxia (HCC)   Glasgow coma scale total score 3-8 (HCC)   E coli bacteremia   Staphylococcus aureus bacteremia with sepsis ([place])   Cholangitis   Central line infection   Sepsis ([place])   Encounter for central line placement   Encounter for orogastric (OG) tube placement   Cholangiectasis   Acute calculus cholecystitis - Status post percutaneous biliary drain placed by IR. Continue biliary drain and follow further recommendations from IR regarding the care of drain. - Continue IV Unasyn as per ID recommendations for now - Follow LFTs  Septic shock - Present on admission. Resolved  Escherichia coli and MSSA bacteremia - Probably from cholecystitis. Central line has been removed. Repeat cultures from 09/07/2016 are negative so far. - ID recommends to continue IV Unasyn to 09/20/2016 and start Augmentin from August 29  and continuing to drain comes out  Status post acute hypoxic respiratory failure status post extubation - Currently stable  Chronic atrial fibrillation - Currently tachycardic. Resume oral metoprolol. Continue Eliquis   Leukocytosis - Resolved   dementia - Monitor mental status. Fall precautions - Might need nursing home placement. Social worker consult   DVT prophylaxis: Eliquis Code Status:   DO NOT RESUSCITATE  Family Communication:  none at bedside  Disposition Plan:Nursing home in 2-3 days   Consultants: ID, surgery, IR, critical care   Procedures: IR guided percutaneous biliary drain placement on 09/06/2016  Antimicrobials:Zosyn from 09/03/2016-09/05/2016 Rocephin, Flagyl, Vancomycin 1 dose on 09/03/2016   Unasyn from 09/06/2016 onwards  Subjective: Patient seen and examined at bedside. She is sleeping but wakes up on calling her name and answer some questions. She is slightly confused.   Objective: Vitals:   09/07/16 2123 09/08/16 0454 09/08/16 0459 09/08/16 1010  BP: 117/66  114/80   Pulse: (!) 102  (!) 111 (!) 137  Resp: 19  17   Temp: 98.2 F (36.8 C)  97.7 F (36.5 C)   TempSrc: Oral  Oral   SpO2: 100%  96% (!) 88%  Weight:  57.9 kg (127 lb 11.2 oz)      Intake/Output Summary (Last 24 hours) at 09/08/16 1029 Last data filed at 09/08/16 0731  Gross per 24 hour  Intake               40 ml  Output              515 ml  Net             -475 ml  Filed Weights   09/06/16 0405 09/07/16 0500 09/08/16 0454  Weight: 58.7 kg (129 lb 6.6 oz) 58.6 kg (129 lb 3 oz) 57.9 kg (127 lb 11.2 oz)    Examination:  General exam: Appears calm and comfortable But slightly confused  Respiratory system: Bilateral decreased breath sound at bases Cardiovascular system: S1 & S2 heard; tachycardic Gastrointestinal system: Abdomen is nondistended, soft and nontender. Normal bowel sounds heard. Right-sided percutaneous drain present Extremities: No cyanosis, clubbing, edema      Data Reviewed: I have personally reviewed following labs and imaging studies  CBC:  Recent Labs Lab 09/03/16 1137 09/03/16 1204 09/04/16 0303 09/05/16 0352 09/06/16 0400 09/07/16 0300  WBC 8.8  --  21.6* 14.5* 7.5 5.3  NEUTROABS 8.3*  --  19.1* 12.1*  --  3.0  HGB 12.5 14.3 10.0* 8.8* 8.8* 9.8*  HCT 38.9 42.0 30.7* 27.3* 27.3* 30.6*  MCV 103.7*  --  102.7* 101.5* 103.4* 103.0*  PLT 206  --  193 147* 136* 559*   Basic Metabolic Panel:  Recent Labs Lab 09/03/16 1137 09/03/16 1204 09/04/16 0303 09/04/16 1100 09/05/16 0352 09/06/16 0400 09/07/16 0300  NA 139 141 139  --  140 141 143  K 4.0 4.1 3.7  --  3.0* 3.7 3.6  CL 103 101 104  --  106 108 109  CO2 27  --  23  --  '27 27 25  '$ GLUCOSE 119* 114* 111*  --  79 86 101*  BUN 19 25* 15  --  '10 11 8  '$ CREATININE 0.83 0.70 0.81  --  0.66 0.71 0.58  CALCIUM 8.5*  --  7.7*  --  7.9* 8.0* 8.3*  MG  --   --   --  1.7 2.2 1.9  --   PHOS  --   --   --   --  2.2* 2.3*  --    GFR: Estimated Creatinine Clearance: 47.8 mL/min (by C-G formula based on SCr of 0.58 mg/dL). Liver Function Tests:  Recent Labs Lab 09/03/16 1137 09/04/16 0303 09/05/16 0352  AST 96* 211* 80*  ALT 93* 146* 85*  ALKPHOS 335* 348* 254*  BILITOT 4.8* 5.4* 2.6*  PROT 8.2* 6.2* 5.3*  ALBUMIN 3.1* 2.0* 2.0*   No results for input(s): LIPASE, AMYLASE in the last 168 hours. No results for input(s): AMMONIA in the last 168 hours. Coagulation Profile:  Recent Labs Lab 09/04/16 1100 09/05/16 0754  INR 2.67 1.97   Cardiac Enzymes:  Recent Labs Lab 09/03/16 1535 09/03/16 1936 09/04/16 0303  TROPONINI 0.06* 0.05* 0.04*   BNP (last 3 results) No results for input(s): PROBNP in the last 8760 hours. HbA1C: No results for input(s): HGBA1C in the last 72 hours. CBG:  Recent Labs Lab 09/04/16 2024 09/05/16 0045 09/05/16 0411 09/06/16 0606 09/06/16 0622  GLUCAP 77 80 73 66 143*   Lipid Profile: No results for input(s): CHOL, HDL,  LDLCALC, TRIG, CHOLHDL, LDLDIRECT in the last 72 hours. Thyroid Function Tests: No results for input(s): TSH, T4TOTAL, FREET4, T3FREE, THYROIDAB in the last 72 hours. Anemia Panel: No results for input(s): VITAMINB12, FOLATE, FERRITIN, TIBC, IRON, RETICCTPCT in the last 72 hours. Sepsis Labs:  Recent Labs Lab 09/03/16 1144 09/03/16 1502 09/03/16 1535 09/03/16 1936 09/04/16 1100 09/05/16 0352 09/06/16 0400  PROCALCITON  --   --  5.97  --  6.76 4.27 2.59  LATICACIDVEN 3.15* 2.68* 2.2* 1.9  --   --   --     Recent Results (from the  past 240 hour(s))  Blood Culture (routine x 2)     Status: Abnormal   Collection Time: 09/03/16 11:36 AM  Result Value Ref Range Status   Specimen Description BLOOD LEFT FOREARM  Final   Special Requests   Final    BOTTLES DRAWN AEROBIC AND ANAEROBIC Blood Culture adequate volume   Culture  Setup Time   Final    GRAM NEGATIVE RODS IN BOTH AEROBIC AND ANAEROBIC BOTTLES CRITICAL RESULT CALLED TO, READ BACK BY AND VERIFIED WITH: K COOK 09/04/16 @ 0627 M VESTAL    Culture (A)  Final    ESCHERICHIA COLI SUSCEPTIBILITIES PERFORMED ON PREVIOUS CULTURE WITHIN THE LAST 5 DAYS. STAPHYLOCOCCUS AUREUS KLEBSIELLA PNEUMONIAE ORGANISM NOT VIABLE IN CULTURE.    Report Status 09/08/2016 FINAL  Final   Organism ID, Bacteria STAPHYLOCOCCUS AUREUS  Final      Susceptibility   Staphylococcus aureus - MIC*    CIPROFLOXACIN <=0.5 SENSITIVE Sensitive     ERYTHROMYCIN <=0.25 SENSITIVE Sensitive     GENTAMICIN <=0.5 SENSITIVE Sensitive     OXACILLIN <=0.25 SENSITIVE Sensitive     TETRACYCLINE <=1 SENSITIVE Sensitive     VANCOMYCIN <=0.5 SENSITIVE Sensitive     TRIMETH/SULFA <=10 SENSITIVE Sensitive     CLINDAMYCIN <=0.25 SENSITIVE Sensitive     RIFAMPIN <=0.5 SENSITIVE Sensitive     Inducible Clindamycin NEGATIVE Sensitive     * STAPHYLOCOCCUS AUREUS  Blood Culture ID Panel (Reflexed)     Status: Abnormal   Collection Time: 09/03/16 11:36 AM  Result Value Ref  Range Status   Enterococcus species NOT DETECTED NOT DETECTED Final   Vancomycin resistance NOT DETECTED NOT DETECTED Final   Listeria monocytogenes NOT DETECTED NOT DETECTED Final   Staphylococcus species DETECTED (A) NOT DETECTED Final    Comment: CRITICAL RESULT CALLED TO, READ BACK BY AND VERIFIED WITH: K COOK 09/04/16 @ 0627 M VESTAL    Staphylococcus aureus DETECTED (A) NOT DETECTED Final    Comment: CRITICAL RESULT CALLED TO, READ BACK BY AND VERIFIED WITH: K COOK 09/04/16 @ 0627 M VESTAL    Methicillin resistance NOT DETECTED NOT DETECTED Final   Streptococcus species NOT DETECTED NOT DETECTED Final   Streptococcus agalactiae NOT DETECTED NOT DETECTED Final   Streptococcus pneumoniae NOT DETECTED NOT DETECTED Final   Streptococcus pyogenes NOT DETECTED NOT DETECTED Final   Acinetobacter baumannii NOT DETECTED NOT DETECTED Final   Enterobacteriaceae species DETECTED (A) NOT DETECTED Final    Comment: CRITICAL RESULT CALLED TO, READ BACK BY AND VERIFIED WITH: K COOK 09/04/16 @ 0627 M VESTAL    Enterobacter cloacae complex NOT DETECTED NOT DETECTED Final   Escherichia coli DETECTED (A) NOT DETECTED Final    Comment: CRITICAL RESULT CALLED TO, READ BACK BY AND VERIFIED WITH: K COOK 09/04/16 @ 0627 M VESTAL    Klebsiella oxytoca NOT DETECTED NOT DETECTED Final   Klebsiella pneumoniae DETECTED (A) NOT DETECTED Final    Comment: CRITICAL RESULT CALLED TO, READ BACK BY AND VERIFIED WITH: K COOK 09/04/16 @ 0627 M VESTAL    Proteus species NOT DETECTED NOT DETECTED Final   Serratia marcescens NOT DETECTED NOT DETECTED Final   Carbapenem resistance NOT DETECTED NOT DETECTED Final   Haemophilus influenzae NOT DETECTED NOT DETECTED Final   Neisseria meningitidis NOT DETECTED NOT DETECTED Final   Pseudomonas aeruginosa NOT DETECTED NOT DETECTED Final   Candida albicans NOT DETECTED NOT DETECTED Final   Candida glabrata NOT DETECTED NOT DETECTED Final   Candida krusei NOT DETECTED  NOT  DETECTED Final   Candida parapsilosis NOT DETECTED NOT DETECTED Final   Candida tropicalis NOT DETECTED NOT DETECTED Final  Blood Culture (routine x 2)     Status: Abnormal   Collection Time: 09/03/16 11:45 AM  Result Value Ref Range Status   Specimen Description BLOOD LEFT FOREARM  Final   Special Requests   Final    IN PEDIATRIC BOTTLE Blood Culture results may not be optimal due to an excessive volume of blood received in culture bottles   Culture  Setup Time   Final    GRAM NEGATIVE RODS IN PEDIATRIC BOTTLE CRITICAL RESULT CALLED TO, READ BACK BY AND VERIFIED WITH: K COOK 09/04/16 @ 0627 M VESTAL    Culture ESCHERICHIA COLI (A)  Final   Report Status 09/07/2016 FINAL  Final   Organism ID, Bacteria ESCHERICHIA COLI  Final      Susceptibility   Escherichia coli - MIC*    AMPICILLIN 4 SENSITIVE Sensitive     CEFAZOLIN <=4 SENSITIVE Sensitive     CEFEPIME <=1 SENSITIVE Sensitive     CEFTAZIDIME <=1 SENSITIVE Sensitive     CEFTRIAXONE <=1 SENSITIVE Sensitive     CIPROFLOXACIN <=0.25 SENSITIVE Sensitive     GENTAMICIN <=1 SENSITIVE Sensitive     IMIPENEM <=0.25 SENSITIVE Sensitive     TRIMETH/SULFA <=20 SENSITIVE Sensitive     AMPICILLIN/SULBACTAM <=2 SENSITIVE Sensitive     PIP/TAZO <=4 SENSITIVE Sensitive     Extended ESBL NEGATIVE Sensitive     * ESCHERICHIA COLI  Urine culture     Status: None   Collection Time: 09/03/16 12:27 PM  Result Value Ref Range Status   Specimen Description URINE, RANDOM  Final   Special Requests NONE  Final   Culture NO GROWTH  Final   Report Status 09/04/2016 FINAL  Final  MRSA PCR Screening     Status: None   Collection Time: 09/03/16  4:42 PM  Result Value Ref Range Status   MRSA by PCR NEGATIVE NEGATIVE Final    Comment:        The GeneXpert MRSA Assay (FDA approved for NASAL specimens only), is one component of a comprehensive MRSA colonization surveillance program. It is not intended to diagnose MRSA infection nor to guide  or monitor treatment for MRSA infections.   Culture, blood (Routine X 2) w Reflex to ID Panel     Status: None (Preliminary result)   Collection Time: 09/05/16 12:28 PM  Result Value Ref Range Status   Specimen Description BLOOD RIGHT HAND  Final   Special Requests IN PEDIATRIC BOTTLE Blood Culture adequate volume  Final   Culture NO GROWTH 3 DAYS  Final   Report Status PENDING  Incomplete  Culture, blood (Routine X 2) w Reflex to ID Panel     Status: None (Preliminary result)   Collection Time: 09/05/16 12:28 PM  Result Value Ref Range Status   Specimen Description BLOOD LEFT HAND  Final   Special Requests IN PEDIATRIC BOTTLE Blood Culture adequate volume  Final   Culture NO GROWTH 3 DAYS  Final   Report Status PENDING  Incomplete  Body fluid culture     Status: None (Preliminary result)   Collection Time: 09/05/16  4:36 PM  Result Value Ref Range Status   Specimen Description BILE  Final   Special Requests NONE  Final   Gram Stain   Final    ABUNDANT WBC PRESENT, PREDOMINANTLY PMN ABUNDANT GRAM VARIABLE ROD    Culture NO  GROWTH 2 DAYS  Final   Report Status PENDING  Incomplete  Culture, blood (Routine X 2) w Reflex to ID Panel     Status: None (Preliminary result)   Collection Time: 09/06/16  7:59 AM  Result Value Ref Range Status   Specimen Description BLOOD RIGHT WRIST  Final   Special Requests IN PEDIATRIC BOTTLE Blood Culture adequate volume  Final   Culture NO GROWTH 2 DAYS  Final   Report Status PENDING  Incomplete  Culture, blood (Routine X 2) w Reflex to ID Panel     Status: None (Preliminary result)   Collection Time: 09/06/16  8:05 AM  Result Value Ref Range Status   Specimen Description BLOOD BLOOD LEFT HAND  Final   Special Requests IN PEDIATRIC BOTTLE Blood Culture adequate volume  Final   Culture  Setup Time   Final    GRAM POSITIVE COCCI IN CLUSTERS IN PEDIATRIC BOTTLE CRITICAL RESULT CALLED TO, READ BACK BY AND VERIFIED WITH: V.BRYK, PHARMD 09/07/16 0622  L.CHAMPION    Culture GRAM POSITIVE COCCI IN CLUSTERS  Final   Report Status PENDING  Incomplete  Blood Culture ID Panel (Reflexed)     Status: None   Collection Time: 09/06/16  8:05 AM  Result Value Ref Range Status   Enterococcus species NOT DETECTED NOT DETECTED Final   Vancomycin resistance NOT DETECTED NOT DETECTED Final   Listeria monocytogenes NOT DETECTED NOT DETECTED Final   Staphylococcus species NOT DETECTED NOT DETECTED Final   Staphylococcus aureus NOT DETECTED NOT DETECTED Final   Methicillin resistance NOT DETECTED NOT DETECTED Final   Streptococcus species NOT DETECTED NOT DETECTED Final   Streptococcus agalactiae NOT DETECTED NOT DETECTED Final   Streptococcus pneumoniae NOT DETECTED NOT DETECTED Final   Streptococcus pyogenes NOT DETECTED NOT DETECTED Final   Acinetobacter baumannii NOT DETECTED NOT DETECTED Final   Enterobacteriaceae species NOT DETECTED NOT DETECTED Final   Enterobacter cloacae complex NOT DETECTED NOT DETECTED Final   Escherichia coli NOT DETECTED NOT DETECTED Final   Klebsiella oxytoca NOT DETECTED NOT DETECTED Final   Klebsiella pneumoniae NOT DETECTED NOT DETECTED Final   Proteus species NOT DETECTED NOT DETECTED Final   Serratia marcescens NOT DETECTED NOT DETECTED Final   Carbapenem resistance NOT DETECTED NOT DETECTED Final   Haemophilus influenzae NOT DETECTED NOT DETECTED Final   Neisseria meningitidis NOT DETECTED NOT DETECTED Final   Pseudomonas aeruginosa NOT DETECTED NOT DETECTED Final   Candida albicans NOT DETECTED NOT DETECTED Final   Candida glabrata NOT DETECTED NOT DETECTED Final   Candida krusei NOT DETECTED NOT DETECTED Final   Candida parapsilosis NOT DETECTED NOT DETECTED Final   Candida tropicalis NOT DETECTED NOT DETECTED Final  Culture, blood (Routine X 2) w Reflex to ID Panel     Status: None (Preliminary result)   Collection Time: 09/07/16  7:04 PM  Result Value Ref Range Status   Specimen Description BLOOD LEFT  ANTECUBITAL  Final   Special Requests   Final    BOTTLES DRAWN AEROBIC AND ANAEROBIC Blood Culture results may not be optimal due to an excessive volume of blood received in culture bottles   Culture NO GROWTH < 24 HOURS  Final   Report Status PENDING  Incomplete  Culture, blood (Routine X 2) w Reflex to ID Panel     Status: None (Preliminary result)   Collection Time: 09/07/16  7:14 PM  Result Value Ref Range Status   Specimen Description BLOOD RIGHT ANTECUBITAL  Final  Special Requests   Final    BOTTLES DRAWN AEROBIC AND ANAEROBIC Blood Culture adequate volume   Culture NO GROWTH < 24 HOURS  Final   Report Status PENDING  Incomplete         Radiology Studies: No results found.      Scheduled Meds: . apixaban  2.5 mg Oral BID  . chlorhexidine gluconate (MEDLINE KIT)  15 mL Mouth Rinse BID  . escitalopram  10 mg Oral Daily  . gabapentin  100 mg Oral Daily  . mouth rinse  15 mL Mouth Rinse QID  . metoprolol tartrate  2.5 mg Intravenous Q6H  . pantoprazole  40 mg Oral QHS  . risperiDONE  0.5 mg Oral QHS   Continuous Infusions: . sodium chloride    . sodium chloride Stopped (09/07/16 1856)  . ampicillin-sulbactam (UNASYN) IV Stopped (09/08/16 0601)  . dextrose 10 mL/hr at 09/06/16 0800  . phenylephrine (NEO-SYNEPHRINE) Adult infusion Stopped (09/05/16 1620)     LOS: 5 days        Aline August, MD [facility] Pager [phone]  If 7PM-7AM, please contact night-coverage [email or web address] Password [facility] 09/08/2016, 10:29 AM

## 2016-09-08 NOTE — Progress Notes (Signed)
  Speech Language Pathology Treatment: Dysphagia  Patient Details Name: [name] [name] MRN: [id number] DOB: 04/07/1930 Today's Date: 09/08/2016 Time: [id number] SLP Time Calculation (min) (ACUTE ONLY): 14 min  Assessment / Plan / Recommendation Clinical Impression  Pt consumed thin liquids via large, consecutive straw sips with no overt signs of aspiration. She politely declined solids and by looking at her lunch tray it looks like she only had a few bites of her meal. Per her son-in-law, she did not want any of it. SLP provided encouragement for increased intake. Recommend to continue current diet for now.    HPI HPI: Pt is an 83 y.o.female who at baseline suffers from moderate dementia, manifested as memory loss, frequent falls and indifference to food. She was brought in for fever and AMS felt to be related to acute calculous cholecystitis s/p drain placement 8/13. She was intubated 8/11-8/12 when she self-extubated. She was started on clear liquids 8/14 but was observed to be coughing. She was made NPO pending SLP evaluation. PMH also includes: OSA, HTN, HLD, h/o alcohol abuse, depression, CHF, a fib, anxiety      SLP Plan  Continue with current plan of care       Recommendations  Diet recommendations: Regular;Thin liquid Liquids provided via: Cup;Straw Medication Administration: Whole meds with puree Supervision: Patient able to self feed;Intermittent supervision to cue for compensatory strategies Compensations: Slow rate;Small sips/bites;Follow solids with liquid Postural Changes and/or Swallow Maneuvers: Seated upright 90 degrees;Upright 30-60 min after meal                Oral Care Recommendations: Oral care BID Follow up Recommendations: 24 hour supervision/assistance SLP Visit Diagnosis: Dysphagia, pharyngeal phase (R13.13) Plan: Continue with current plan of care       GO                Maxcine Hamaiewonsky, Elisabella Hacker 09/08/2016, 2:50 PM  Maxcine HamLaura Paiewonsky, M.A.  CCC-SLP [phone]

## 2016-09-08 NOTE — Evaluation (Signed)
Physical Therapy Evaluation Patient Details Name: [name] [name] MRN: [id number] DOB: 03/27/1930 Today's Date: 09/08/2016   History of Present Illness  83 y.o. female admitted with fever, apnea, intubated 8/11-8/12/18. Cholecystitis, drain placed 8/13. She has a  past medical history of Anemia; Anxiety; Arthritis; Atrial fibrillation (HCC); CHF (congestive heart failure) (HCC); Dementia; Depression; Diverticulosis of colon; History of alcohol abuse; Hyperlipidemia; Hypertension; IPF (idiopathic pulmonary fibrosis) (HCC); Irritable bowel syndrome; OSA (obstructive sleep apnea); Osteoporosis; PMR (polymyalgia rheumatica) (HCC); Postherpetic neuralgia; Spinal stenosis; Thyroid disease; Vertebral fracture; and Vitamin D deficiency.  Clinical Impression  Pt admitted with above diagnosis. Pt currently with functional limitations due to the deficits listed below (see PT Problem List). Mod assist supine to sit, transfer deferred 2* back pain and HR of 137 with bed mobility.  Pt will benefit from skilled PT to increase their independence and safety with mobility to allow discharge to the venue listed below.       Follow Up Recommendations SNF    Equipment Recommendations  Other (comment) (TBD)    Recommendations for Other Services       Precautions / Restrictions Precautions Precautions: Fall Precaution Comments: R biliary drain Restrictions Weight Bearing Restrictions: No      Mobility  Bed Mobility Overal bed mobility: Needs Assistance Bed Mobility: Rolling;Sidelying to Sit;Sit to Supine Rolling: Mod assist Sidelying to sit: Mod assist   Sit to supine: Mod assist   General bed mobility comments: mod A to initiate movement and to raise trunk  Transfers                 General transfer comment: deferred -pt HR 137 with supine to sit, pt reports severe back pain and stated she couldn't tolerate pivoting to recliner so returned to supine  Ambulation/Gait                 Stairs            Wheelchair Mobility    Modified Rankin (Stroke Patients Only)       Balance Overall balance assessment: Needs assistance Sitting-balance support: Feet supported;Single extremity supported Sitting balance-Leahy Scale: Fair                                       Pertinent Vitals/Pain Pain Assessment: 0-10 Pain Score: 8  Pain Location: back Pain Intervention(s): Limited activity within patient's tolerance;Monitored during session;Patient requesting pain meds-RN notified;Repositioned    Home Living Family/patient expects to be discharged to:: Private residence Living Arrangements: Spouse/significant other Available Help at Discharge: Family (daughter lives nearby but works during day)   Home Access: Level entry     Home Layout: One level Home Equipment: Environmental consultantWalker - 2 wheels Additional Comments: pt stated she doesn't use AD when walking, no family to confirm, pt poor historian, prior PT note in Oct 2017 stated pt uses RW    Prior Function Level of Independence: Needs assistance   Gait / Transfers Assistance Needed: Per chart from Oct 2017 (pt poor historian): cane vs RW. Family states they are trying to get her to switch over completely to using walker  ADL's / Homemaking Assistance Needed: aides assist with bathing/dressing  Comments: Pt not a reliable historian, stated she lives at home with spouse, doesn't use AD to walk, independent with ADLs. This conflicts with info from Oct 2017 PT note.      Hand Dominance  Extremity/Trunk Assessment   Upper Extremity Assessment Upper Extremity Assessment: Defer to OT evaluation    Lower Extremity Assessment Lower Extremity Assessment: Overall WFL for tasks assessed (4/5 B knee ext)    Cervical / Trunk Assessment Cervical / Trunk Assessment: Kyphotic  Communication   Communication: No difficulties  Cognition Arousal/Alertness: Awake/alert Behavior During Therapy: WFL for  tasks assessed/performed Overall Cognitive Status: No family/caregiver present to determine baseline cognitive functioning                                 General Comments: not oriented to month/year, nor to location, h/o dementia per chart      General Comments      Exercises     Assessment/Plan    PT Assessment Patient needs continued PT services  PT Problem List Decreased activity tolerance;Decreased cognition;Pain;Decreased mobility       PT Treatment Interventions Gait training;Functional mobility training;Therapeutic activities;Therapeutic exercise;Patient/family education    PT Goals (Current goals can be found in the Care Plan section)  Acute Rehab PT Goals Patient Stated Goal: none stated PT Goal Formulation: Patient unable to participate in goal setting Time For Goal Achievement: 09/22/16 Potential to Achieve Goals: Good    Frequency Min 3X/week   Barriers to discharge        Co-evaluation               AM-PAC PT "6 Clicks" Daily Activity  Outcome Measure Difficulty turning over in bed (including adjusting bedclothes, sheets and blankets)?: Total Difficulty moving from lying on back to sitting on the side of the bed? : Total Difficulty sitting down on and standing up from a chair with arms (e.g., wheelchair, bedside commode, etc,.)?: Total Help needed moving to and from a bed to chair (including a wheelchair)?: Total Help needed walking in hospital room?: Total Help needed climbing 3-5 steps with a railing? : Total 6 Click Score: 6    End of Session   Activity Tolerance: Treatment limited secondary to medical complications (Comment);Patient limited by pain (incr HR with activity) Patient left: in bed;with bed alarm set Nurse Communication: Mobility status;Patient requests pain meds PT Visit Diagnosis: Difficulty in walking, not elsewhere classified (R26.2);Pain Pain - part of body:  (back)    Time: 0940-1006 PT Time Calculation  (min) (ACUTE ONLY): 26 min   Charges:   PT Evaluation $PT Eval Moderate Complexity: 1 Mod PT Treatments $Therapeutic Activity: 8-22 mins   PT G Codes:          Tamala Ser 09/08/2016, 10:19 AM [phone]

## 2016-09-08 NOTE — Progress Notes (Signed)
PHARMACY CONSULT NOTE FOR:  OUTPATIENT PARENTERAL ANTIBIOTIC THERAPY (OPAT)  Indication: E coli and MSSA bacteremia Regimen: Unasyn 3 grams q 8 hours End date: September 20, 2016, pending new blood cultures  IV antibiotic discharge orders are pended to start 09/09/16-adjust as needed. To discharging provider: please sign these orders via discharge navigator,  Select New Orders & click on the button choice - Manage This Unsigned Work.    Thank you for allowing pharmacy to be a part of this patient's care.  Signe Coltonya C Gem Conkle 09/08/2016, 11:15 AM

## 2016-09-08 NOTE — NC FL2 (Signed)
[facility] LEVEL OF CARE SCREENING TOOL     IDENTIFICATION  Patient Name: [name] [name] Birthdate: 1930-02-29 Sex: female Admission Date (Current Location): 09/03/2016  [facility] and [place] Number:  Herbalist and Address:  [facility]. [facility], [place] [address], [place], [place] [address]      Provider Number: [id number]  Attending Physician Name and Address:  Aline August, MD  Relative Name and Phone Number:  Cyndra Numbers, daughter, [phone]    Current Level of Care: Hospital Recommended Level of Care: [place] Prior Approval Number:    Date Approved/Denied:   PASRR Number: [id number]  Discharge Plan: SNF    Current Diagnoses: Patient Active Problem List   Diagnosis Date Noted  . Encounter for central line placement   . Encounter for orogastric (OG) tube placement   . Cholangiectasis   . Sepsis ([place])   . Acute respiratory failure with hypoxia ([place])   . Glasgow coma scale total score 3-8 ([place])   . E coli bacteremia   . Staphylococcus aureus bacteremia with sepsis ([place])   . Cholangitis   . Central line infection   . Septic shock ([place]) 09/03/2016  . Thoracic spine pain 08/30/2016  . Compulsive skin picking 08/30/2016  . Permanent atrial fibrillation ([place]) 05/09/2016  . Dilated cardiomyopathy ([place]) 05/09/2016  . Dementia 02/04/2016  . Pulmonary edema 11/20/2015  . Chronic systolic heart failure ([place]) 11/20/2015  . Hyperbilirubinemia 11/20/2015  . Easy bruising 11/10/2015  . Bilateral leg edema 10/23/2015  . Urinary incontinence 10/23/2015  . PAF (paroxysmal atrial fibrillation) ([place]) 05/27/2015  . Fatigue 06/03/2014  . Hypothyroidism 04/02/2014  . Absolute anemia 01/01/2014  . Idiopathic pulmonary fibrosis ([place]) 01/01/2014  . OA (osteoarthritis) of knee 07/02/2010  . Memory loss 07/02/2010  . Hyperlipidemia 03/08/2010  . Vitamin D deficiency 10/05/2009  . Obstructive sleep apnea 10/08/2007  .  SYNDROME, CHRONIC PAIN 11/16/2006  . DIVERTICULOSIS, COLON 06/06/2006  . Depression 04/30/2006  . Essential hypertension 04/30/2006  . OSTEOPOROSIS 04/30/2006    Orientation RESPIRATION BLADDER Height & Weight     Self  Normal Incontinent Weight: 57.9 kg (127 lb 11.2 oz) Height:     BEHAVIORAL SYMPTOMS/MOOD NEUROLOGICAL BOWEL NUTRITION STATUS      Incontinent (Biliary tube) Diet (Please see DC Summary)  AMBULATORY STATUS COMMUNICATION OF NEEDS Skin   Extensive Assist Verbally PU Stage and Appropriate Care (Stage I Pressure injury on back)                       Personal Care Assistance Level of Assistance  Bathing, Feeding, Dressing Bathing Assistance: Maximum assistance Feeding assistance: Limited assistance Dressing Assistance: Limited assistance     Functional Limitations Info             SPECIAL CARE FACTORS FREQUENCY  PT (By licensed PT)     PT Frequency: 5x/week              Contractures      Additional Factors Info  Code Status, Allergies, Psychotropic Code Status Info: DNR Allergies Info: Clindamycin Hcl, Rofecoxib, Celecoxib, Sertraline Hcl Psychotropic Info: Risperdal         Current Medications (09/08/2016):  This is the current hospital active medication list Current Facility-Administered Medications  Medication Dose Route Frequency Provider Last Rate Last Dose  . 0.9 %  sodium chloride infusion  250 mL Intravenous PRN Sampson Goon, MD      . 0.9 %  sodium  chloride infusion   Intravenous Continuous Raylene Miyamoto, MD   Stopped at 09/07/16 1856  . Ampicillin-Sulbactam (UNASYN) 3 g in sodium chloride 0.9 % 100 mL IVPB  3 g Intravenous Q8H Jalene Mullet, RPH   Stopped at 09/08/16 0601  . apixaban (ELIQUIS) tablet 2.5 mg  2.5 mg Oral BID Raylene Miyamoto, MD   2.5 mg at 09/08/16 0849  . carvedilol (COREG) tablet 3.125 mg  3.125 mg Oral BID Alekh, Kshitiz, MD      . chlorhexidine gluconate (MEDLINE KIT) (PERIDEX) 0.12 % solution 15 mL   15 mL Mouth Rinse BID Sampson Goon, MD   15 mL at 09/08/16 0032  . dextrose 10 % infusion   Intravenous Continuous Javier Glazier, MD 10 mL/hr at 09/06/16 0800    . escitalopram (LEXAPRO) tablet 10 mg  10 mg Oral Daily Raylene Miyamoto, MD   10 mg at 09/08/16 0849  . gabapentin (NEURONTIN) capsule 100 mg  100 mg Oral Daily Raylene Miyamoto, MD   100 mg at 09/08/16 0850  . [START ON 09/09/2016] levothyroxine (SYNTHROID, LEVOTHROID) tablet 75 mcg  75 mcg Oral QAC breakfast Aline August, MD      . MEDLINE mouth rinse  15 mL Mouth Rinse QID Sampson Goon, MD   15 mL at 09/08/16 0537  . pantoprazole (PROTONIX) EC tablet 40 mg  40 mg Oral QHS Raylene Miyamoto, MD   40 mg at 09/07/16 2108  . risperiDONE (RISPERDAL) tablet 0.5 mg  0.5 mg Oral QHS Raylene Miyamoto, MD      . traMADol Veatrice Bourbon) tablet 50-100 mg  50-100 mg Oral Q6H PRN Raylene Miyamoto, MD   50 mg at 09/08/16 2162     Discharge Medications: Please see discharge summary for a list of discharge medications.  Relevant Imaging Results:  Relevant Lab Results:   Additional Information SSN [id number]  Benard Halsted, LCSWA

## 2016-09-08 NOTE — Care Management Note (Signed)
Case Management Note  Patient Details  Name: Kirstie MirzaGloria G Amon MRN: [id number] Date of Birth: 05/07/1930  Subjective/Objective:       Admitted with septic shock, history of moderate dementia, chronic atrial fibrillation on anticoagulation, CHF, hypertension, hyperlipidemia.  Cholecystitis - Percutaneous chole drain placed in IR 8/13  This hospital stay found to have  E coli and MSSA. Bacteremia.  Franki Monteharles G Soledad (Spouse) [name] (Daughter)    [phone]       PCP: Gershon CraneStephen Fry  Action/Plan: PT evaluation pending .... CM to f/u with disposition  needs. CM spoke with pt/ husband ( Charles)regarding discharge planning. Husband states the plan is to d/c to SNF/ rehab. and would like  [facility] ....Marland Kitchen.CSW made aware.  Expected Discharge Date:                  Expected Discharge Plan:  Skilled Nursing Facility  In-House Referral:  Clinical Social Work  Discharge planning Services  CM Consult  Post Acute Care Choice:    Choice offered to:  Patient  DME Arranged:    DME Agency:     HH Arranged:    HH Agency:     Status of Service:  Completed, signed off  If discussed at [employer] Meetings, dates discussed:    Additional Comments:  Epifanio LeschesCole, Nathifa Ritthaler Hudson, RN 09/08/2016, 11:30 AM

## 2016-09-08 NOTE — Clinical Social Work Note (Addendum)
Clinical Social Work Assessment  Patient Details  Name: [name] [name] MRN: [id number] Date of Birth: 09/06/1930  Date of referral:  09/08/16               Reason for consult:  Facility Placement                Permission sought to share information with:  Facility Medical sales representativeContact Representative, Family Supports Permission granted to share information::  No (Disoriented)  Name::     Investment banker, corporateBroughton  Agency::  SNFs  Relationship::  Daughter  Contact Information:  [phone]  Housing/Transportation Living arrangements for the past 2 months:  Single Family Home Source of Information:  Adult Children, Spouse Patient Interpreter Needed:  None Criminal Activity/Legal Involvement Pertinent to Current Situation/Hospitalization:  No - Comment as needed Significant Relationships:  Adult Children, Spouse Lives with:  Spouse Do you feel safe going back to the place where you live?  No Need for family participation in patient care:  Yes (Comment)  Care giving concerns:  CSW received consult for possible SNF placement at time of discharge. Patient is disoriented. CSW spoke with patient's daughter, [name] [name], regarding PT recommendation of SNF placement at time of discharge. Patient's daughter reported that patient's spouse is currently unable to care for patient at their home given patient's current physical needs and fall risk. Patient' daughter expressed understanding of PT recommendation and is agreeable to SNF placement at time of discharge. CSW to continue to follow and assist with discharge planning needs.   Social Worker assessment / plan:  CSW spoke with patient's daughter concerning [facility] before returning home.  Employment status:  Retired Database administratornsurance information:  Managed Medicare PT Recommendations:  Skilled Nursing Facility Information / Referral to community resources:  Skilled Nursing Facility  Patient/Family's Response to care:  Patient recognizes need for rehab before  returning home and [place]. Patient's daughter reported preference for [facility], however, [facility] does not [facility] bed availability until next week. Daughter provided with snf list and will tour [facility] today. Daughter expressed being very stressed at this time and has a lot going on while taking care of her parents.   Patient/Family's Understanding of and Emotional Response to Diagnosis, Current Treatment, and Prognosis:  Patient/family is realistic regarding therapy needs and expressed being hopeful for SNF placement. Patient's daughter expressed understanding of CSW role and discharge process as well as patient's medical condition. No questions/concerns about plan or treatment.    Emotional Assessment Appearance:  Appears stated age Attitude/Demeanor/Rapport:  Unable to Assess Affect (typically observed):  Unable to Assess Orientation:  Oriented to Self Alcohol / Substance use:  Not Applicable Psych involvement (Current and /or in the community):  No (Comment)  Discharge Needs  Concerns to be addressed:  Care Coordination Readmission within the last 30 days:  No Current discharge risk:  None Barriers to Discharge:  Continued Medical Work up   [name] [name], LCSWA 09/08/2016, 11:36 AM

## 2016-09-08 NOTE — Progress Notes (Signed)
Referring Physician(s): CCM DR Geannie RisenK Alekh  Supervising Physician: Simonne ComeWatts, John  [facility]  Chief Complaint:  Cholecystitis Percutaneous chole drain placed in IR 8/13  Subjective:  Draining well; pt feels better daily Eating well Transferred to floor   Allergies: Clindamycin hcl; Rofecoxib; Celecoxib; and Sertraline hcl  Medications: Prior to Admission medications   Medication Sig Start Date End Date Taking? Authorizing Provider  acetaminophen (TYLENOL) 500 MG tablet Take 1,000 mg by mouth every 12 (twelve) hours as needed (moderate to severe pain).    Yes [provider]  carvedilol (COREG) 3.125 MG tablet TAKE 1 TABLET TWICE DAILY. Patient taking differently: TAKE 1 TABLET (3.125 MG) BY MOUTH TWICE DAILY. 02/02/16  Yes Nelwyn SalisburyFry, Stephen A, MD  ELIQUIS 2.5 MG TABS tablet TAKE 1 TABLET TWICE DAILY. Patient taking differently: TAKE 1 TABLET(2.5 MG) BY MOUTH TWICE DAILY. 03/21/16  Yes Nelwyn SalisburyFry, Stephen A, MD  ENSURE (ENSURE) Take by mouth 2 (two) times daily.   Yes [provider]  escitalopram (LEXAPRO) 10 MG tablet Take 1 tablet (10 mg total) by mouth daily. 02/03/16  Yes Nelwyn SalisburyFry, Stephen A, MD  ferrous sulfate 325 (65 FE) MG tablet TAKE 1 TABLET TWICE DAILY. Patient taking differently: TAKE 1 TABLET (325 MG) BY MOUTH TWICE DAILY. 03/29/16  Yes Nelwyn SalisburyFry, Stephen A, MD  furosemide (LASIX) 40 MG tablet Take 1 tablet (40 mg total) by mouth daily. 11/24/15  Yes Johnson, Clanford L, MD  gabapentin (NEURONTIN) 100 MG capsule TAKE 1 CAPSULE EVERY DAY. Patient taking differently: TAKE 1 CAPSULE (100 MG) BY MOUTH EVERY DAY 02/22/16  Yes Nelwyn SalisburyFry, Stephen A, MD  levothyroxine (SYNTHROID, LEVOTHROID) 75 MCG tablet Take 75 mcg by mouth daily before breakfast.   Yes [provider]  magnesium oxide (MAG-OX) 400 MG tablet Take 800 mg by mouth daily. For constipation   Yes [provider]  pantoprazole (PROTONIX) 40 MG tablet Take 1 tablet (40 mg total) by mouth  daily. 05/15/15  Yes Azalee CourseMeng, Hao, PA  potassium chloride (KLOR-CON) 20 MEQ packet Take 20 mEq by mouth 2 (two) times daily. 02/03/16  Yes Nelwyn SalisburyFry, Stephen A, MD  risperiDONE (RISPERDAL) 0.5 MG tablet Once daily at suppertime Patient taking differently: 0.5 mg daily with supper.  08/30/16  Yes Nelwyn SalisburyFry, Stephen A, MD  traMADol (ULTRAM) 50 MG tablet Take 2 tablets (100 mg total) by mouth every 6 (six) hours as needed for moderate pain. Patient taking differently: Take 100 mg by mouth every 6 (six) hours as needed for moderate pain or severe pain.  08/30/16  Yes Nelwyn SalisburyFry, Stephen A, MD     Vital Signs: BP 114/80 (BP Location: Right Arm)   Pulse (!) 111   Temp 97.7 F (36.5 C) (Oral)   Resp 17   Wt 127 lb 11.2 oz (57.9 kg)   SpO2 96%   BMI 20.00 kg/m   Physical Exam  Abdominal: Soft. Bowel sounds are normal.  Skin: Skin is warm and dry.  Site of drain is clean and dry NT no bleeding OP 265 cc yesterday 100 cc today: bile Cx No growth    Nursing note and vitals reviewed.   Imaging: Ir Perc Cholecystostomy  Result Date: 09/05/2016 INDICATION: 83 year old with acute cholecystitis. Sepsis and needs percutaneous drainage of the gallbladder. EXAM: PERCUTANEOUS CHOLECYSTOSTOMY TUBE PLACEMENT WITH ULTRASOUND AND FLUOROSCOPIC GUIDANCE MEDICATIONS: None ANESTHESIA/SEDATION: Fentanyl 25 mcg FLUOROSCOPY TIME:  Fluoroscopy Time: 1 minutes, 23 mGy COMPLICATIONS: None immediate. PROCEDURE: Informed written consent was obtained from the  patient after a thorough discussion of the procedural risks, benefits and alternatives. All questions were addressed. Maximal Sterile Barrier Technique was utilized including caps, mask, sterile gowns, sterile gloves, sterile drape, hand hygiene and skin antiseptic. A timeout was performed prior to the initiation of the procedure. Gallbladder was identified with ultrasound. The right side of the abdomen was prepped and draped in sterile fashion. Skin was anesthetized with 1% lidocaine. 22  gauge Chiba needle was directed into the gallbladder with ultrasound guidance. 0.018 wire was advanced into the gallbladder. Accustick dilator set was placed. J wire was advanced to the gallbladder. The tract was dilated to accommodate a 10 [place] multipurpose drain. Approximately 50 mL of brown fluid was removed from the gallbladder. Fluid was sent for culture. Catheter was sutured to skin. Drain was attached to gravity bag. Fluoroscopic and ultrasound images were taken and saved for documentation. FINDINGS: Dilated gallbladder with thick wall and a small amount of surrounding fluid. Tube was placed directly into the gallbladder. A transhepatic approach was not used due to the patient's anatomy. The gallbladder was decompressed at the end of the procedure. IMPRESSION: Successful percutaneous cholecystostomy tube placement with ultrasound and fluoroscopic guidance. Electronically Signed   By: Richarda Overlie M.D.   On: 09/05/2016 18:42    Labs:  CBC:  Recent Labs  09/04/16 0303 09/05/16 0352 09/06/16 0400 09/07/16 0300  WBC 21.6* 14.5* 7.5 5.3  HGB 10.0* 8.8* 8.8* 9.8*  HCT 30.7* 27.3* 27.3* 30.6*  PLT 193 147* 136* 140*    COAGS:  Recent Labs  11/05/15 1032 09/04/16 1100 09/05/16 0754  INR 1.43 2.67 1.97  APTT  --  41*  --     BMP:  Recent Labs  09/04/16 0303 09/05/16 0352 09/06/16 0400 09/07/16 0300  NA 139 140 141 143  K 3.7 3.0* 3.7 3.6  CL 104 106 108 109  CO2 23 27 27 25   GLUCOSE 111* 79 86 101*  BUN 15 10 11 8   CALCIUM 7.7* 7.9* 8.0* 8.3*  CREATININE 0.81 0.66 0.71 0.58  GFRNONAA >60 >60 >60 >60  GFRAA >60 >60 >60 >60    LIVER FUNCTION TESTS:  Recent Labs  03/07/16 1533 09/03/16 1137 09/04/16 0303 09/05/16 0352  BILITOT 1.2 4.8* 5.4* 2.6*  AST 26 96* 211* 80*  ALT 14 93* 146* 85*  ALKPHOS 78 335* 348* 254*  PROT 7.5 8.2* 6.2* 5.3*  ALBUMIN 2.3* 3.1* 2.0* 2.0*    Assessment and Plan:  Cholecystostomy drain intact To remain 6-8 weeks unless to  OR IR can follow in drain clinic Order in to see pt as OP 5 weeks   Electronically Signed: Treylan Mcclintock A, PA-C 09/08/2016, 7:46 AM   I spent a total of 15 Minutes at the the patient's bedside AND on the patient's hospital floor or unit, greater than 50% of which was counseling/coordinating care for perc chole drain

## 2016-09-09 DIAGNOSIS — K830 Cholangitis: Secondary | ICD-10-CM

## 2016-09-09 DIAGNOSIS — L8990 Pressure ulcer of unspecified site, unspecified stage: Secondary | ICD-10-CM | POA: Insufficient documentation

## 2016-09-09 LAB — CBC WITH DIFFERENTIAL/PLATELET
Basophils Absolute: 0.1 10*3/uL (ref 0.0–0.1)
Basophils Relative: 1 %
EOS ABS: 0.4 10*3/uL (ref 0.0–0.7)
Eosinophils Relative: 7 %
HCT: 31.3 % — ABNORMAL LOW (ref 36.0–46.0)
Hemoglobin: 10.1 g/dL — ABNORMAL LOW (ref 12.0–15.0)
LYMPHS PCT: 37 %
Lymphs Abs: 2 10*3/uL (ref 0.7–4.0)
MCH: 33.8 pg (ref 26.0–34.0)
MCHC: 32.3 g/dL (ref 30.0–36.0)
MCV: 104.7 fL — ABNORMAL HIGH (ref 78.0–100.0)
MONO ABS: 0.7 10*3/uL (ref 0.1–1.0)
Monocytes Relative: 14 %
NEUTROS PCT: 41 %
Neutro Abs: 2.1 10*3/uL (ref 1.7–7.7)
PLATELETS: 155 10*3/uL (ref 150–400)
RBC: 2.99 MIL/uL — AB (ref 3.87–5.11)
RDW: 21.2 % — AB (ref 11.5–15.5)
WBC: 5.3 10*3/uL (ref 4.0–10.5)

## 2016-09-09 LAB — COMPREHENSIVE METABOLIC PANEL
ALBUMIN: 2.1 g/dL — AB (ref 3.5–5.0)
ALT: 39 U/L (ref 14–54)
ANION GAP: 5 (ref 5–15)
AST: 26 U/L (ref 15–41)
Alkaline Phosphatase: 154 U/L — ABNORMAL HIGH (ref 38–126)
BUN: 9 mg/dL (ref 6–20)
CHLORIDE: 106 mmol/L (ref 101–111)
CO2: 30 mmol/L (ref 22–32)
Calcium: 8.2 mg/dL — ABNORMAL LOW (ref 8.9–10.3)
Creatinine, Ser: 0.6 mg/dL (ref 0.44–1.00)
GFR calc Af Amer: >60 mL/min (ref >60)
GFR calc non Af Amer: >60 mL/min (ref >60)
GLUCOSE: 85 mg/dL (ref 65–99)
POTASSIUM: 3.5 mmol/L (ref 3.5–5.1)
SODIUM: 141 mmol/L (ref 135–145)
TOTAL PROTEIN: 5.7 g/dL — AB (ref 6.5–8.1)
Total Bilirubin: 1.5 mg/dL — ABNORMAL HIGH (ref 0.3–1.2)

## 2016-09-09 LAB — CULTURE, BLOOD (ROUTINE X 2): Special Requests: ADEQUATE

## 2016-09-09 LAB — MAGNESIUM: MAGNESIUM: 1.8 mg/dL (ref 1.7–2.4)

## 2016-09-09 MED ORDER — CHLORHEXIDINE GLUCONATE 0.12 % MT SOLN
OROMUCOSAL | Status: AC
  Administered 2016-09-09: 15 mL via OROMUCOSAL
  Filled 2016-09-09: qty 15

## 2016-09-09 NOTE — Progress Notes (Signed)
Patient ID: [name] [name], female   DOB: [birth year], 83 y.o.   MRN: [id number]  PROGRESS NOTE    [name] [name]  YCX:[id number] DOB: Jun 29, 1930 DOA: 09/03/2016 PCP: Laurey Morale, MD   Brief Narrative:  83 year old female with history of moderate dementia, chronic atrial fibrillation on anticoagulation, CHF, hypertension, hyperlipidemia presented on 09/03/2016 with fever and altered mental status and was admitted to ICU with septic shock and started on broad-spectrum antibiotics and intravenous Levophed. Patient was found to have acute cholecystitis for which general surgery was consulted. Patient was thought to be a poor candidate for surgery so patient underwent percutaneous biliary drain placed by interventional radiology. Patient initially was intubated for respiratory failure but patient self extubated. Blood cultures grew Escherichia coli and MSSA; ID was consulted. Central line was removed. Repeat blood cultures from 09/07/2016 are negative so far. Patient was transferred out of ICU.   Assessment & Plan:   Active Problems:   Septic shock (HCC)   Acute respiratory failure with hypoxia (HCC)   Glasgow coma scale total score 3-8 (HCC)   E coli bacteremia   Staphylococcus aureus bacteremia with sepsis (HCC)   Cholangitis   Central line infection   Sepsis ([place])   Encounter for central line placement   Encounter for orogastric (OG) tube placement   Cholangiectasis  Acute calculus cholecystitis and cholangitis - Status post percutaneous biliary drain placed by IR. Continue biliary drain and follow further recommendations from IR regarding the care of drain. - Continue IV Unasyn as per ID recommendations for now - Follow LFTs  Septic shock - Present on admission. Resolved  Escherichia coli and MSSA bacteremia - Probably from cholecystitis. Central line has been removed. Repeat cultures from 09/07/2016 are negative so far. - ID recommends to continue IV Unasyn to 09/20/2016 and start  Augmentin from August 29 and continuing to drain comes out - If blood cultures remain negative till tomorrow, will place an order for PICC line placement  Status post acute hypoxic respiratory failure status post extubation - Currently stable  Chronic atrial fibrillation - Continue metoprolol. Continue Eliquis   Leukocytosis - Resolved   dementia - Monitor mental status. Fall precautions - Might need nursing home placement. Social worker consult   DVT prophylaxis: Eliquis Code Status:   DO NOT RESUSCITATE  Family Communication:  none at bedside  Disposition Plan:Nursing home in 2-3 days   Consultants: ID, surgery, IR, critical care   Procedures: IR guided percutaneous biliary drain placement on 09/05/2016  Antimicrobials:Zosyn from 09/03/2016-09/05/2016 Rocephin, Flagyl, Vancomycin 1 dose on 09/03/2016   Unasyn from 09/06/2016 onwards  Subjective: Patient seen and examined at bedside. She is awake and answers some questions but is slightly confused. No overnight fever or vomiting.  Objective: Vitals:   09/08/16 1352 09/08/16 1500 09/08/16 2209 09/09/16 0513  BP: (!) 142/88 93/60 114/66 111/63  Pulse: (!) 113 88 97 97  Resp:  _0 Temp:  98 F (36.7 C) 97.6 F (36.4 C) 98.1 F (36.7 C)  TempSrc:   Oral Oral  SpO2:  97% 100% 99%  Weight:    61.1 kg (134 lb 12.8 oz)    Intake/Output Summary (Last 24 hours) at 09/09/16 1258 Last data filed at 09/09/16 0732  Gross per 24 hour  Intake              240 ml  Output              220 ml  Net  20 ml   Filed Weights   09/07/16 0500 09/08/16 0454 09/09/16 0513  Weight: 58.6 kg (129 lb 3 oz) 57.9 kg (127 lb 11.2 oz) 61.1 kg (134 lb 12.8 oz)    Examination:  General exam: Appears calm and comfortable But slightly confused Respiratory system: Bilateral decreased breath sound at bases Cardiovascular system: S1 & S2 heard, Rate controlled  Gastrointestinal system: Abdomen is nondistended, soft  and nontender. Normal bowel sounds heard. Right-sided percutaneous drain present Extremities: No cyanosis, clubbing, edema   Data Reviewed: I have personally reviewed following labs and imaging studies  CBC:  Recent Labs Lab 09/03/16 1137  09/04/16 0303 09/05/16 0352 09/06/16 0400 09/07/16 0300 09/09/16 0511  WBC 8.8  --  21.6* 14.5* 7.5 5.3 5.3  NEUTROABS 8.3*  --  19.1* 12.1*  --  3.0 2.1  HGB 12.5  < > 10.0* 8.8* 8.8* 9.8* 10.1*  HCT 38.9  < > 30.7* 27.3* 27.3* 30.6* 31.3*  MCV 103.7*  --  102.7* 101.5* 103.4* 103.0* 104.7*  PLT 206  --  193 147* 136* 140* 155  < > = values in this interval not displayed. Basic Metabolic Panel:  Recent Labs Lab 09/04/16 0303 09/04/16 1100 09/05/16 0352 09/06/16 0400 09/07/16 0300 09/09/16 0511  NA 139  --  140 141 143 141  K 3.7  --  3.0* 3.7 3.6 3.5  CL 104  --  106 108 109 106  CO2 23  --  _0 GLUCOSE 111*  --  79 86 101* 85  BUN 15  --  _1 CREATININE 0.81  --  0.66 0.71 0.58 0.60  CALCIUM 7.7*  --  7.9* 8.0* 8.3* 8.2*  MG  --  1.7 2.2 1.9  --  1.8  PHOS  --   --  2.2* 2.3*  --   --    GFR: Estimated Creatinine Clearance: 50.5 mL/min (by C-G formula based on SCr of 0.6 mg/dL). Liver Function Tests:  Recent Labs Lab 09/03/16 1137 09/04/16 0303 09/05/16 0352 09/09/16 0511  AST 96* 211* 80* 26  ALT 93* 146* 85* 39  ALKPHOS 335* 348* 254* 154*  BILITOT 4.8* 5.4* 2.6* 1.5*  PROT 8.2* 6.2* 5.3* 5.7*  ALBUMIN 3.1* 2.0* 2.0* 2.1*   No results for input(s): LIPASE, AMYLASE in the last 168 hours. No results for input(s): AMMONIA in the last 168 hours. Coagulation Profile:  Recent Labs Lab 09/04/16 1100 09/05/16 0754  INR 2.67 1.97   Cardiac Enzymes:  Recent Labs Lab 09/03/16 1535 09/03/16 1936 09/04/16 0303  TROPONINI 0.06* 0.05* 0.04*   BNP (last 3 results) No results for input(s): PROBNP in the last 8760 hours. HbA1C: No results for input(s): HGBA1C in the last 72 hours. CBG:  Recent  Labs Lab 09/04/16 2024 09/05/16 0045 09/05/16 0411 09/06/16 0606 09/06/16 0622  GLUCAP 77 80 73 66 143*   Lipid Profile: No results for input(s): CHOL, HDL, LDLCALC, TRIG, CHOLHDL, LDLDIRECT in the last 72 hours. Thyroid Function Tests: No results for input(s): TSH, T4TOTAL, FREET4, T3FREE, THYROIDAB in the last 72 hours. Anemia Panel: No results for input(s): VITAMINB12, FOLATE, FERRITIN, TIBC, IRON, RETICCTPCT in the last 72 hours. Sepsis Labs:  Recent Labs Lab 09/03/16 1144 09/03/16 1502 09/03/16 1535 09/03/16 1936 09/04/16 1100 09/05/16 0352 09/06/16 0400  PROCALCITON  --   --  5.97  --  6.76 4.27 2.59  LATICACIDVEN 3.15* 2.68* 2.2* 1.9  --   --   --  Recent Results (from the past 240 hour(s))  Blood Culture (routine x 2)     Status: Abnormal   Collection Time: 09/03/16 11:36 AM  Result Value Ref Range Status   Specimen Description BLOOD LEFT FOREARM  Final   Special Requests   Final    BOTTLES DRAWN AEROBIC AND ANAEROBIC Blood Culture adequate volume   Culture  Setup Time   Final    GRAM NEGATIVE RODS IN BOTH AEROBIC AND ANAEROBIC BOTTLES CRITICAL RESULT CALLED TO, READ BACK BY AND VERIFIED WITH: K COOK 09/04/16 @ 0627 M VESTAL    Culture (A)  Final    ESCHERICHIA COLI SUSCEPTIBILITIES PERFORMED ON PREVIOUS CULTURE WITHIN THE LAST 5 DAYS. STAPHYLOCOCCUS AUREUS KLEBSIELLA PNEUMONIAE ORGANISM NOT VIABLE IN CULTURE.    Report Status 09/08/2016 FINAL  Final   Organism ID, Bacteria STAPHYLOCOCCUS AUREUS  Final      Susceptibility   Staphylococcus aureus - MIC*    CIPROFLOXACIN <=0.5 SENSITIVE Sensitive     ERYTHROMYCIN <=0.25 SENSITIVE Sensitive     GENTAMICIN <=0.5 SENSITIVE Sensitive     OXACILLIN <=0.25 SENSITIVE Sensitive     TETRACYCLINE <=1 SENSITIVE Sensitive     VANCOMYCIN <=0.5 SENSITIVE Sensitive     TRIMETH/SULFA <=10 SENSITIVE Sensitive     CLINDAMYCIN <=0.25 SENSITIVE Sensitive     RIFAMPIN <=0.5 SENSITIVE Sensitive     Inducible Clindamycin  NEGATIVE Sensitive     * STAPHYLOCOCCUS AUREUS  Blood Culture ID Panel (Reflexed)     Status: Abnormal   Collection Time: 09/03/16 11:36 AM  Result Value Ref Range Status   Enterococcus species NOT DETECTED NOT DETECTED Final   Vancomycin resistance NOT DETECTED NOT DETECTED Final   Listeria monocytogenes NOT DETECTED NOT DETECTED Final   Staphylococcus species DETECTED (A) NOT DETECTED Final    Comment: CRITICAL RESULT CALLED TO, READ BACK BY AND VERIFIED WITH: K COOK 09/04/16 @ 0627 M VESTAL    Staphylococcus aureus DETECTED (A) NOT DETECTED Final    Comment: CRITICAL RESULT CALLED TO, READ BACK BY AND VERIFIED WITH: K COOK 09/04/16 @ 0627 M VESTAL    Methicillin resistance NOT DETECTED NOT DETECTED Final   Streptococcus species NOT DETECTED NOT DETECTED Final   Streptococcus agalactiae NOT DETECTED NOT DETECTED Final   Streptococcus pneumoniae NOT DETECTED NOT DETECTED Final   Streptococcus pyogenes NOT DETECTED NOT DETECTED Final   Acinetobacter baumannii NOT DETECTED NOT DETECTED Final   Enterobacteriaceae species DETECTED (A) NOT DETECTED Final    Comment: CRITICAL RESULT CALLED TO, READ BACK BY AND VERIFIED WITH: K COOK 09/04/16 @ 0627 M VESTAL    Enterobacter cloacae complex NOT DETECTED NOT DETECTED Final   Escherichia coli DETECTED (A) NOT DETECTED Final    Comment: CRITICAL RESULT CALLED TO, READ BACK BY AND VERIFIED WITH: K COOK 09/04/16 @ 0627 M VESTAL    Klebsiella oxytoca NOT DETECTED NOT DETECTED Final   Klebsiella pneumoniae DETECTED (A) NOT DETECTED Final    Comment: CRITICAL RESULT CALLED TO, READ BACK BY AND VERIFIED WITH: K COOK 09/04/16 @ 0627 M VESTAL    Proteus species NOT DETECTED NOT DETECTED Final   Serratia marcescens NOT DETECTED NOT DETECTED Final   Carbapenem resistance NOT DETECTED NOT DETECTED Final   Haemophilus influenzae NOT DETECTED NOT DETECTED Final   Neisseria meningitidis NOT DETECTED NOT DETECTED Final   Pseudomonas aeruginosa NOT DETECTED  NOT DETECTED Final   Candida albicans NOT DETECTED NOT DETECTED Final   Candida glabrata NOT DETECTED NOT DETECTED Final  Candida krusei NOT DETECTED NOT DETECTED Final   Candida parapsilosis NOT DETECTED NOT DETECTED Final   Candida tropicalis NOT DETECTED NOT DETECTED Final  Blood Culture (routine x 2)     Status: Abnormal   Collection Time: 09/03/16 11:45 AM  Result Value Ref Range Status   Specimen Description BLOOD LEFT FOREARM  Final   Special Requests   Final    IN PEDIATRIC BOTTLE Blood Culture results may not be optimal due to an excessive volume of blood received in culture bottles   Culture  Setup Time   Final    GRAM NEGATIVE RODS IN PEDIATRIC BOTTLE CRITICAL RESULT CALLED TO, READ BACK BY AND VERIFIED WITH: K COOK 09/04/16 @ 0627 M VESTAL    Culture ESCHERICHIA COLI (A)  Final   Report Status 09/07/2016 FINAL  Final   Organism ID, Bacteria ESCHERICHIA COLI  Final      Susceptibility   Escherichia coli - MIC*    AMPICILLIN 4 SENSITIVE Sensitive     CEFAZOLIN <=4 SENSITIVE Sensitive     CEFEPIME <=1 SENSITIVE Sensitive     CEFTAZIDIME <=1 SENSITIVE Sensitive     CEFTRIAXONE <=1 SENSITIVE Sensitive     CIPROFLOXACIN <=0.25 SENSITIVE Sensitive     GENTAMICIN <=1 SENSITIVE Sensitive     IMIPENEM <=0.25 SENSITIVE Sensitive     TRIMETH/SULFA <=20 SENSITIVE Sensitive     AMPICILLIN/SULBACTAM <=2 SENSITIVE Sensitive     PIP/TAZO <=4 SENSITIVE Sensitive     Extended ESBL NEGATIVE Sensitive     * ESCHERICHIA COLI  Urine culture     Status: None   Collection Time: 09/03/16 12:27 PM  Result Value Ref Range Status   Specimen Description URINE, RANDOM  Final   Special Requests NONE  Final   Culture NO GROWTH  Final   Report Status 09/04/2016 FINAL  Final  MRSA PCR Screening     Status: None   Collection Time: 09/03/16  4:42 PM  Result Value Ref Range Status   MRSA by PCR NEGATIVE NEGATIVE Final    Comment:        The GeneXpert MRSA Assay (FDA approved for NASAL  specimens only), is one component of a comprehensive MRSA colonization surveillance program. It is not intended to diagnose MRSA infection nor to guide or monitor treatment for MRSA infections.   Culture, blood (Routine X 2) w Reflex to ID Panel     Status: None (Preliminary result)   Collection Time: 09/05/16 12:28 PM  Result Value Ref Range Status   Specimen Description BLOOD RIGHT HAND  Final   Special Requests IN PEDIATRIC BOTTLE Blood Culture adequate volume  Final   Culture NO GROWTH 3 DAYS  Final   Report Status PENDING  Incomplete  Culture, blood (Routine X 2) w Reflex to ID Panel     Status: None (Preliminary result)   Collection Time: 09/05/16 12:28 PM  Result Value Ref Range Status   Specimen Description BLOOD LEFT HAND  Final   Special Requests IN PEDIATRIC BOTTLE Blood Culture adequate volume  Final   Culture NO GROWTH 3 DAYS  Final   Report Status PENDING  Incomplete  Body fluid culture     Status: None (Preliminary result)   Collection Time: 09/05/16  4:36 PM  Result Value Ref Range Status   Specimen Description BILE  Final   Special Requests NONE  Final   Gram Stain   Final    ABUNDANT WBC PRESENT, PREDOMINANTLY PMN ABUNDANT GRAM VARIABLE ROD  Culture   Final    CULTURE REINCUBATED FOR BETTER GROWTH NO ANAEROBES ISOLATED    Report Status PENDING  Incomplete  Culture, blood (Routine X 2) w Reflex to ID Panel     Status: None (Preliminary result)   Collection Time: 09/06/16  7:59 AM  Result Value Ref Range Status   Specimen Description BLOOD RIGHT WRIST  Final   Special Requests IN PEDIATRIC BOTTLE Blood Culture adequate volume  Final   Culture NO GROWTH 2 DAYS  Final   Report Status PENDING  Incomplete  Culture, blood (Routine X 2) w Reflex to ID Panel     Status: None (Preliminary result)   Collection Time: 09/06/16  8:05 AM  Result Value Ref Range Status   Specimen Description BLOOD BLOOD LEFT HAND  Final   Special Requests IN PEDIATRIC BOTTLE Blood  Culture adequate volume  Final   Culture  Setup Time   Final    GRAM POSITIVE COCCI IN CLUSTERS IN PEDIATRIC BOTTLE CRITICAL RESULT CALLED TO, READ BACK BY AND VERIFIED WITH: V.BRYK, PHARMD 09/07/16 0622 L.CHAMPION    Culture   Final    GRAM POSITIVE COCCI IN CLUSTERS IDENTIFICATION TO FOLLOW    Report Status PENDING  Incomplete  Blood Culture ID Panel (Reflexed)     Status: None   Collection Time: 09/06/16  8:05 AM  Result Value Ref Range Status   Enterococcus species NOT DETECTED NOT DETECTED Final   Vancomycin resistance NOT DETECTED NOT DETECTED Final   Listeria monocytogenes NOT DETECTED NOT DETECTED Final   Staphylococcus species NOT DETECTED NOT DETECTED Final   Staphylococcus aureus NOT DETECTED NOT DETECTED Final   Methicillin resistance NOT DETECTED NOT DETECTED Final   Streptococcus species NOT DETECTED NOT DETECTED Final   Streptococcus agalactiae NOT DETECTED NOT DETECTED Final   Streptococcus pneumoniae NOT DETECTED NOT DETECTED Final   Streptococcus pyogenes NOT DETECTED NOT DETECTED Final   Acinetobacter baumannii NOT DETECTED NOT DETECTED Final   Enterobacteriaceae species NOT DETECTED NOT DETECTED Final   Enterobacter cloacae complex NOT DETECTED NOT DETECTED Final   Escherichia coli NOT DETECTED NOT DETECTED Final   Klebsiella oxytoca NOT DETECTED NOT DETECTED Final   Klebsiella pneumoniae NOT DETECTED NOT DETECTED Final   Proteus species NOT DETECTED NOT DETECTED Final   Serratia marcescens NOT DETECTED NOT DETECTED Final   Carbapenem resistance NOT DETECTED NOT DETECTED Final   Haemophilus influenzae NOT DETECTED NOT DETECTED Final   Neisseria meningitidis NOT DETECTED NOT DETECTED Final   Pseudomonas aeruginosa NOT DETECTED NOT DETECTED Final   Candida albicans NOT DETECTED NOT DETECTED Final   Candida glabrata NOT DETECTED NOT DETECTED Final   Candida krusei NOT DETECTED NOT DETECTED Final   Candida parapsilosis NOT DETECTED NOT DETECTED Final   Candida  tropicalis NOT DETECTED NOT DETECTED Final  Culture, blood (Routine X 2) w Reflex to ID Panel     Status: None (Preliminary result)   Collection Time: 09/07/16  7:04 PM  Result Value Ref Range Status   Specimen Description BLOOD LEFT ANTECUBITAL  Final   Special Requests   Final    BOTTLES DRAWN AEROBIC AND ANAEROBIC Blood Culture results may not be optimal due to an excessive volume of blood received in culture bottles   Culture NO GROWTH < 24 HOURS  Final   Report Status PENDING  Incomplete  Culture, blood (Routine X 2) w Reflex to ID Panel     Status: None (Preliminary result)   Collection Time: 09/07/16  7:14 PM  Result Value Ref Range Status   Specimen Description BLOOD RIGHT ANTECUBITAL  Final   Special Requests   Final    BOTTLES DRAWN AEROBIC AND ANAEROBIC Blood Culture adequate volume   Culture NO GROWTH < 24 HOURS  Final   Report Status PENDING  Incomplete         Radiology Studies: No results found.      Scheduled Meds: . apixaban  2.5 mg Oral BID  . carvedilol  3.125 mg Oral BID  . chlorhexidine gluconate (MEDLINE KIT)  15 mL Mouth Rinse BID  . escitalopram  10 mg Oral Daily  . gabapentin  100 mg Oral Daily  . levothyroxine  75 mcg Oral QAC breakfast  . mouth rinse  15 mL Mouth Rinse QID  . pantoprazole  40 mg Oral QHS  . risperiDONE  0.5 mg Oral QHS   Continuous Infusions: . sodium chloride    . sodium chloride Stopped (09/07/16 1856)  . ampicillin-sulbactam (UNASYN) IV Stopped (09/09/16 0630)  . dextrose 10 mL/hr at 09/06/16 0800     LOS: 6 days        Aline August, MD [facility] Pager [phone]  If 7PM-7AM, please contact night-coverage [email or web address] Password [facility] 09/09/2016, 12:58 PM

## 2016-09-10 DIAGNOSIS — Z515 Encounter for palliative care: Secondary | ICD-10-CM

## 2016-09-10 DIAGNOSIS — K838 Other specified diseases of biliary tract: Secondary | ICD-10-CM

## 2016-09-10 LAB — BODY FLUID CULTURE

## 2016-09-10 LAB — CBC WITH DIFFERENTIAL/PLATELET
Basophils Absolute: 0 10*3/uL (ref 0.0–0.1)
Basophils Relative: 1 %
EOS ABS: 0.2 10*3/uL (ref 0.0–0.7)
EOS PCT: 4 %
HCT: 33.1 % — ABNORMAL LOW (ref 36.0–46.0)
Hemoglobin: 10.6 g/dL — ABNORMAL LOW (ref 12.0–15.0)
LYMPHS ABS: 2.2 10*3/uL (ref 0.7–4.0)
Lymphocytes Relative: 35 %
MCH: 33.3 pg (ref 26.0–34.0)
MCHC: 32 g/dL (ref 30.0–36.0)
MCV: 104.1 fL — ABNORMAL HIGH (ref 78.0–100.0)
MONO ABS: 1 10*3/uL (ref 0.1–1.0)
MONOS PCT: 16 %
Neutro Abs: 2.8 10*3/uL (ref 1.7–7.7)
Neutrophils Relative %: 45 %
Platelets: 168 10*3/uL (ref 150–400)
RBC: 3.18 MIL/uL — ABNORMAL LOW (ref 3.87–5.11)
RDW: 20.9 % — AB (ref 11.5–15.5)
WBC: 6.2 10*3/uL (ref 4.0–10.5)

## 2016-09-10 LAB — COMPREHENSIVE METABOLIC PANEL
ALT: 38 U/L (ref 14–54)
AST: 33 U/L (ref 15–41)
Albumin: 2.3 g/dL — ABNORMAL LOW (ref 3.5–5.0)
Alkaline Phosphatase: 149 U/L — ABNORMAL HIGH (ref 38–126)
Anion gap: 10 (ref 5–15)
BUN: 6 mg/dL (ref 6–20)
CALCIUM: 8.4 mg/dL — AB (ref 8.9–10.3)
CHLORIDE: 107 mmol/L (ref 101–111)
CO2: 24 mmol/L (ref 22–32)
CREATININE: 0.49 mg/dL (ref 0.44–1.00)
GFR calc Af Amer: >60 mL/min (ref >60)
Glucose, Bld: 83 mg/dL (ref 65–99)
Potassium: 4 mmol/L (ref 3.5–5.1)
SODIUM: 141 mmol/L (ref 135–145)
TOTAL PROTEIN: 6.1 g/dL — AB (ref 6.5–8.1)
Total Bilirubin: 1.7 mg/dL — ABNORMAL HIGH (ref 0.3–1.2)

## 2016-09-10 LAB — CULTURE, BLOOD (ROUTINE X 2)
CULTURE: NO GROWTH
Culture: NO GROWTH
Special Requests: ADEQUATE
Special Requests: ADEQUATE

## 2016-09-10 LAB — MAGNESIUM: Magnesium: 1.8 mg/dL (ref 1.7–2.4)

## 2016-09-10 NOTE — Plan of Care (Signed)
Problem: Physical Regulation: Goal: Will remain free from infection Outcome: Progressing Pt will be free from signs and symptoms of infection prior to discharge.  Problem: Skin Integrity: Goal: Risk for impaired skin integrity will decrease Outcome: Progressing Pt will be turned q 2 hrs to to prevent further pressure injuries during this hospitalization.

## 2016-09-10 NOTE — Progress Notes (Signed)
Patient ID: [name] [name], female   DOB: 17-Feb-1930, 83 y.o.   MRN: [id number]  PROGRESS NOTE    NYKIA TURKO  CBJ:[id number] DOB: Jul 14, 1930 DOA: 09/03/2016 PCP: Laurey Morale, MD   Brief Narrative:  83 year old female with history of moderate dementia, chronic atrial fibrillation on anticoagulation, CHF, hypertension, hyperlipidemia presented on 09/03/2016 with fever and altered mental status and was admitted to ICU with septic shock and started on broad-spectrum antibiotics and intravenous Levophed. Patient was found to have acute cholecystitis for which general surgery was consulted. Patient was thought to be a poor candidate for surgery so patient underwent percutaneous biliary drain placed by interventional radiology. Patient initially was intubated for respiratory failure but patient self extubated. Blood cultures grew Escherichia coli and MSSA; ID was consulted. Central line was removed. Repeat blood cultures from 09/07/2016 are negative so far. Patient was transferred out of ICU.   Assessment & Plan:   Active Problems:   Septic shock (HCC)   Acute respiratory failure with hypoxia (HCC)   Glasgow coma scale total score 3-8 (HCC)   E coli bacteremia   Staphylococcus aureus bacteremia with sepsis (HCC)   Cholangitis   Central line infection   Sepsis ([place])   Encounter for central line placement   Encounter for orogastric (OG) tube placement   Cholangiectasis   Pressure injury of skin   Acute calculus cholecystitis and cholangitis - Status post percutaneous biliary drain placed by IR. Continue biliary drain and follow further recommendations from IR regarding the care of drain. - Continue IV Unasyn as per ID recommendations for now - Follow LFTs  Septic shock - Present on admission. Resolved  Escherichia coli and MSSA bacteremia - Probably from cholecystitis. Central line has been removed. Repeat cultures from 09/07/2016 are negative so far. - ID recommends to continue IV  Unasyn to 09/20/2016 and start Augmentin from August 29 and continue till drain comes out - Repeat blood cultures are negative so far. PICC line to be placed probably today.  Status post acute hypoxic respiratory failure status post extubation - Currently stable  Chronic atrial fibrillation - Continue metoprolol. Continue Eliquis  Leukocytosis - Resolved  dementia - Monitor mental status. Fall precautions - Nursing home placement - Palliative care consult for goals of care   DVT prophylaxis:Eliquis Code Status:DO NOT RESUSCITATE  Family Communication:none at bedside  Disposition Plan:Nursing home in 1-2 days   Consultants:ID, surgery, IR, critical care   Procedures:IR guided percutaneous biliary drain placement on 09/05/2016  Antimicrobials:Zosyn from 09/03/2016-09/05/2016 Rocephin, Flagyl, Vancomycin 1 dose on 09/03/2016  Unasyn from 09/06/2016 onwards  Subjective: Patient seen and examined at bedside. She is awake but confused. No overnight fever or vomiting  Objective: Vitals:   09/09/16 0513 09/09/16 1330 09/09/16 2135 09/10/16 0605  BP: 111/63 (!) 110/54 119/76 108/63  Pulse: 97 98 100 (!) 107  Resp: '16 18 16 16  '$ Temp: 98.1 F (36.7 C)  97.7 F (36.5 C) (!) 97.5 F (36.4 C)  TempSrc: Oral  Oral Oral  SpO2: 99% 99% 98% 96%  Weight: 61.1 kg (134 lb 12.8 oz)   60.8 kg (134 lb 1.6 oz)    Intake/Output Summary (Last 24 hours) at 09/10/16 1310 Last data filed at 09/10/16 0703  Gross per 24 hour  Intake              320 ml  Output              100 ml  Net  220 ml   Filed Weights   09/08/16 0454 09/09/16 0513 09/10/16 0605  Weight: 57.9 kg (127 lb 11.2 oz) 61.1 kg (134 lb 12.8 oz) 60.8 kg (134 lb 1.6 oz)    Examination:  General exam: Appears calm and comfortable  Respiratory system: Bilateral decreased breath sound at bases Cardiovascular system: S1 & S2 heard, Rate controlled  Gastrointestinal system: Abdomen is  nondistended, soft and nontender. Normal bowel sounds heard. Right-sided percutaneous drain present Extremities: No cyanosis, clubbing, edema     Data Reviewed: I have personally reviewed following labs and imaging studies  CBC:  Recent Labs Lab 09/04/16 0303 09/05/16 0352 09/06/16 0400 09/07/16 0300 09/09/16 0511 09/10/16 0415  WBC 21.6* 14.5* 7.5 5.3 5.3 6.2  NEUTROABS 19.1* 12.1*  --  3.0 2.1 2.8  HGB 10.0* 8.8* 8.8* 9.8* 10.1* 10.6*  HCT 30.7* 27.3* 27.3* 30.6* 31.3* 33.1*  MCV 102.7* 101.5* 103.4* 103.0* 104.7* 104.1*  PLT 193 147* 136* 140* 155 297   Basic Metabolic Panel:  Recent Labs Lab 09/04/16 1100 09/05/16 0352 09/06/16 0400 09/07/16 0300 09/09/16 0511 09/10/16 0415  NA  --  140 141 143 141 141  K  --  3.0* 3.7 3.6 3.5 4.0  CL  --  106 108 109 106 107  CO2  --  '27 27 25 30 24  '$ GLUCOSE  --  79 86 101* 85 83  BUN  --  '10 11 8 9 6  '$ CREATININE  --  0.66 0.71 0.58 0.60 0.49  CALCIUM  --  7.9* 8.0* 8.3* 8.2* 8.4*  MG 1.7 2.2 1.9  --  1.8 1.8  PHOS  --  2.2* 2.3*  --   --   --    GFR: Estimated Creatinine Clearance: 50.2 mL/min (by C-G formula based on SCr of 0.49 mg/dL). Liver Function Tests:  Recent Labs Lab 09/04/16 0303 09/05/16 0352 09/09/16 0511 09/10/16 0415  AST 211* 80* 26 33  ALT 146* 85* 39 38  ALKPHOS 348* 254* 154* 149*  BILITOT 5.4* 2.6* 1.5* 1.7*  PROT 6.2* 5.3* 5.7* 6.1*  ALBUMIN 2.0* 2.0* 2.1* 2.3*   No results for input(s): LIPASE, AMYLASE in the last 168 hours. No results for input(s): AMMONIA in the last 168 hours. Coagulation Profile:  Recent Labs Lab 09/04/16 1100 09/05/16 0754  INR 2.67 1.97   Cardiac Enzymes:  Recent Labs Lab 09/03/16 1535 09/03/16 1936 09/04/16 0303  TROPONINI 0.06* 0.05* 0.04*   BNP (last 3 results) No results for input(s): PROBNP in the last 8760 hours. HbA1C: No results for input(s): HGBA1C in the last 72 hours. CBG:  Recent Labs Lab 09/04/16 2024 09/05/16 0045 09/05/16 0411  09/06/16 0606 09/06/16 0622  GLUCAP 77 80 73 66 143*   Lipid Profile: No results for input(s): CHOL, HDL, LDLCALC, TRIG, CHOLHDL, LDLDIRECT in the last 72 hours. Thyroid Function Tests: No results for input(s): TSH, T4TOTAL, FREET4, T3FREE, THYROIDAB in the last 72 hours. Anemia Panel: No results for input(s): VITAMINB12, FOLATE, FERRITIN, TIBC, IRON, RETICCTPCT in the last 72 hours. Sepsis Labs:  Recent Labs Lab 09/03/16 1502 09/03/16 1535 09/03/16 1936 09/04/16 1100 09/05/16 0352 09/06/16 0400  PROCALCITON  --  5.97  --  6.76 4.27 2.59  LATICACIDVEN 2.68* 2.2* 1.9  --   --   --     Recent Results (from the past 240 hour(s))  Blood Culture (routine x 2)     Status: Abnormal   Collection Time: 09/03/16 11:36 AM  Result Value Ref Range  Status   Specimen Description BLOOD LEFT FOREARM  Final   Special Requests   Final    BOTTLES DRAWN AEROBIC AND ANAEROBIC Blood Culture adequate volume   Culture  Setup Time   Final    GRAM NEGATIVE RODS IN BOTH AEROBIC AND ANAEROBIC BOTTLES CRITICAL RESULT CALLED TO, READ BACK BY AND VERIFIED WITH: K COOK 09/04/16 @ 0627 M VESTAL    Culture (A)  Final    ESCHERICHIA COLI SUSCEPTIBILITIES PERFORMED ON PREVIOUS CULTURE WITHIN THE LAST 5 DAYS. STAPHYLOCOCCUS AUREUS KLEBSIELLA PNEUMONIAE ORGANISM NOT VIABLE IN CULTURE.    Report Status 09/08/2016 FINAL  Final   Organism ID, Bacteria STAPHYLOCOCCUS AUREUS  Final      Susceptibility   Staphylococcus aureus - MIC*    CIPROFLOXACIN <=0.5 SENSITIVE Sensitive     ERYTHROMYCIN <=0.25 SENSITIVE Sensitive     GENTAMICIN <=0.5 SENSITIVE Sensitive     OXACILLIN <=0.25 SENSITIVE Sensitive     TETRACYCLINE <=1 SENSITIVE Sensitive     VANCOMYCIN <=0.5 SENSITIVE Sensitive     TRIMETH/SULFA <=10 SENSITIVE Sensitive     CLINDAMYCIN <=0.25 SENSITIVE Sensitive     RIFAMPIN <=0.5 SENSITIVE Sensitive     Inducible Clindamycin NEGATIVE Sensitive     * STAPHYLOCOCCUS AUREUS  Blood Culture ID Panel  (Reflexed)     Status: Abnormal   Collection Time: 09/03/16 11:36 AM  Result Value Ref Range Status   Enterococcus species NOT DETECTED NOT DETECTED Final   Vancomycin resistance NOT DETECTED NOT DETECTED Final   Listeria monocytogenes NOT DETECTED NOT DETECTED Final   Staphylococcus species DETECTED (A) NOT DETECTED Final    Comment: CRITICAL RESULT CALLED TO, READ BACK BY AND VERIFIED WITH: K COOK 09/04/16 @ 0627 M VESTAL    Staphylococcus aureus DETECTED (A) NOT DETECTED Final    Comment: CRITICAL RESULT CALLED TO, READ BACK BY AND VERIFIED WITH: K COOK 09/04/16 @ 0627 M VESTAL    Methicillin resistance NOT DETECTED NOT DETECTED Final   Streptococcus species NOT DETECTED NOT DETECTED Final   Streptococcus agalactiae NOT DETECTED NOT DETECTED Final   Streptococcus pneumoniae NOT DETECTED NOT DETECTED Final   Streptococcus pyogenes NOT DETECTED NOT DETECTED Final   Acinetobacter baumannii NOT DETECTED NOT DETECTED Final   Enterobacteriaceae species DETECTED (A) NOT DETECTED Final    Comment: CRITICAL RESULT CALLED TO, READ BACK BY AND VERIFIED WITH: K COOK 09/04/16 @ 0627 M VESTAL    Enterobacter cloacae complex NOT DETECTED NOT DETECTED Final   Escherichia coli DETECTED (A) NOT DETECTED Final    Comment: CRITICAL RESULT CALLED TO, READ BACK BY AND VERIFIED WITH: K COOK 09/04/16 @ 0627 M VESTAL    Klebsiella oxytoca NOT DETECTED NOT DETECTED Final   Klebsiella pneumoniae DETECTED (A) NOT DETECTED Final    Comment: CRITICAL RESULT CALLED TO, READ BACK BY AND VERIFIED WITH: K COOK 09/04/16 @ 0627 M VESTAL    Proteus species NOT DETECTED NOT DETECTED Final   Serratia marcescens NOT DETECTED NOT DETECTED Final   Carbapenem resistance NOT DETECTED NOT DETECTED Final   Haemophilus influenzae NOT DETECTED NOT DETECTED Final   Neisseria meningitidis NOT DETECTED NOT DETECTED Final   Pseudomonas aeruginosa NOT DETECTED NOT DETECTED Final   Candida albicans NOT DETECTED NOT DETECTED Final     Candida glabrata NOT DETECTED NOT DETECTED Final   Candida krusei NOT DETECTED NOT DETECTED Final   Candida parapsilosis NOT DETECTED NOT DETECTED Final   Candida tropicalis NOT DETECTED NOT DETECTED Final  Blood Culture (routine  x 2)     Status: Abnormal   Collection Time: 09/03/16 11:45 AM  Result Value Ref Range Status   Specimen Description BLOOD LEFT FOREARM  Final   Special Requests   Final    IN PEDIATRIC BOTTLE Blood Culture results may not be optimal due to an excessive volume of blood received in culture bottles   Culture  Setup Time   Final    GRAM NEGATIVE RODS IN PEDIATRIC BOTTLE CRITICAL RESULT CALLED TO, READ BACK BY AND VERIFIED WITH: K COOK 09/04/16 @ 0627 M VESTAL    Culture ESCHERICHIA COLI (A)  Final   Report Status 09/07/2016 FINAL  Final   Organism ID, Bacteria ESCHERICHIA COLI  Final      Susceptibility   Escherichia coli - MIC*    AMPICILLIN 4 SENSITIVE Sensitive     CEFAZOLIN <=4 SENSITIVE Sensitive     CEFEPIME <=1 SENSITIVE Sensitive     CEFTAZIDIME <=1 SENSITIVE Sensitive     CEFTRIAXONE <=1 SENSITIVE Sensitive     CIPROFLOXACIN <=0.25 SENSITIVE Sensitive     GENTAMICIN <=1 SENSITIVE Sensitive     IMIPENEM <=0.25 SENSITIVE Sensitive     TRIMETH/SULFA <=20 SENSITIVE Sensitive     AMPICILLIN/SULBACTAM <=2 SENSITIVE Sensitive     PIP/TAZO <=4 SENSITIVE Sensitive     Extended ESBL NEGATIVE Sensitive     * ESCHERICHIA COLI  Urine culture     Status: None   Collection Time: 09/03/16 12:27 PM  Result Value Ref Range Status   Specimen Description URINE, RANDOM  Final   Special Requests NONE  Final   Culture NO GROWTH  Final   Report Status 09/04/2016 FINAL  Final  MRSA PCR Screening     Status: None   Collection Time: 09/03/16  4:42 PM  Result Value Ref Range Status   MRSA by PCR NEGATIVE NEGATIVE Final    Comment:        The GeneXpert MRSA Assay (FDA approved for NASAL specimens only), is one component of a comprehensive MRSA  colonization surveillance program. It is not intended to diagnose MRSA infection nor to guide or monitor treatment for MRSA infections.   Culture, blood (Routine X 2) w Reflex to ID Panel     Status: None (Preliminary result)   Collection Time: 09/05/16 12:28 PM  Result Value Ref Range Status   Specimen Description BLOOD RIGHT HAND  Final   Special Requests IN PEDIATRIC BOTTLE Blood Culture adequate volume  Final   Culture NO GROWTH 4 DAYS  Final   Report Status PENDING  Incomplete  Culture, blood (Routine X 2) w Reflex to ID Panel     Status: None (Preliminary result)   Collection Time: 09/05/16 12:28 PM  Result Value Ref Range Status   Specimen Description BLOOD LEFT HAND  Final   Special Requests IN PEDIATRIC BOTTLE Blood Culture adequate volume  Final   Culture NO GROWTH 4 DAYS  Final   Report Status PENDING  Incomplete  Body fluid culture     Status: None   Collection Time: 09/05/16  4:36 PM  Result Value Ref Range Status   Specimen Description BILE  Final   Special Requests NONE  Final   Gram Stain   Final    ABUNDANT WBC PRESENT, PREDOMINANTLY PMN ABUNDANT GRAM VARIABLE ROD    Culture   Final    RARE KLEBSIELLA PNEUMONIAE NO ANAEROBES ISOLATED    Report Status 09/10/2016 FINAL  Final   Organism ID, Bacteria KLEBSIELLA PNEUMONIAE  Final      Susceptibility   Klebsiella pneumoniae - MIC*    AMPICILLIN >=32 RESISTANT Resistant     CEFAZOLIN <=4 SENSITIVE Sensitive     CEFEPIME <=1 SENSITIVE Sensitive     CEFTAZIDIME <=1 SENSITIVE Sensitive     CEFTRIAXONE <=1 SENSITIVE Sensitive     CIPROFLOXACIN <=0.25 SENSITIVE Sensitive     GENTAMICIN <=1 SENSITIVE Sensitive     IMIPENEM 0.5 SENSITIVE Sensitive     TRIMETH/SULFA <=20 SENSITIVE Sensitive     AMPICILLIN/SULBACTAM 4 SENSITIVE Sensitive     PIP/TAZO <=4 SENSITIVE Sensitive     Extended ESBL NEGATIVE Sensitive     * RARE KLEBSIELLA PNEUMONIAE  Culture, blood (Routine X 2) w Reflex to ID Panel     Status: None  (Preliminary result)   Collection Time: 09/06/16  7:59 AM  Result Value Ref Range Status   Specimen Description BLOOD RIGHT WRIST  Final   Special Requests IN PEDIATRIC BOTTLE Blood Culture adequate volume  Final   Culture NO GROWTH 3 DAYS  Final   Report Status PENDING  Incomplete  Culture, blood (Routine X 2) w Reflex to ID Panel     Status: Abnormal   Collection Time: 09/06/16  8:05 AM  Result Value Ref Range Status   Specimen Description BLOOD BLOOD LEFT HAND  Final   Special Requests IN PEDIATRIC BOTTLE Blood Culture adequate volume  Final   Culture  Setup Time   Final    GRAM POSITIVE COCCI IN CLUSTERS IN PEDIATRIC BOTTLE CRITICAL RESULT CALLED TO, READ BACK BY AND VERIFIED WITH: V.BRYK, PHARMD 09/07/16 0622 L.CHAMPION    Culture (A)  Final    STAPHYLOCOCCUS SPECIES (COAGULASE NEGATIVE) THE SIGNIFICANCE OF ISOLATING THIS ORGANISM FROM A SINGLE SET OF BLOOD CULTURES WHEN MULTIPLE SETS ARE DRAWN IS UNCERTAIN. PLEASE NOTIFY THE MICROBIOLOGY DEPARTMENT WITHIN ONE WEEK IF SPECIATION AND SENSITIVITIES ARE REQUIRED.    Report Status 09/09/2016 FINAL  Final  Blood Culture ID Panel (Reflexed)     Status: None   Collection Time: 09/06/16  8:05 AM  Result Value Ref Range Status   Enterococcus species NOT DETECTED NOT DETECTED Final   Vancomycin resistance NOT DETECTED NOT DETECTED Final   Listeria monocytogenes NOT DETECTED NOT DETECTED Final   Staphylococcus species NOT DETECTED NOT DETECTED Final   Staphylococcus aureus NOT DETECTED NOT DETECTED Final   Methicillin resistance NOT DETECTED NOT DETECTED Final   Streptococcus species NOT DETECTED NOT DETECTED Final   Streptococcus agalactiae NOT DETECTED NOT DETECTED Final   Streptococcus pneumoniae NOT DETECTED NOT DETECTED Final   Streptococcus pyogenes NOT DETECTED NOT DETECTED Final   Acinetobacter baumannii NOT DETECTED NOT DETECTED Final   Enterobacteriaceae species NOT DETECTED NOT DETECTED Final   Enterobacter cloacae complex  NOT DETECTED NOT DETECTED Final   Escherichia coli NOT DETECTED NOT DETECTED Final   Klebsiella oxytoca NOT DETECTED NOT DETECTED Final   Klebsiella pneumoniae NOT DETECTED NOT DETECTED Final   Proteus species NOT DETECTED NOT DETECTED Final   Serratia marcescens NOT DETECTED NOT DETECTED Final   Carbapenem resistance NOT DETECTED NOT DETECTED Final   Haemophilus influenzae NOT DETECTED NOT DETECTED Final   Neisseria meningitidis NOT DETECTED NOT DETECTED Final   Pseudomonas aeruginosa NOT DETECTED NOT DETECTED Final   Candida albicans NOT DETECTED NOT DETECTED Final   Candida glabrata NOT DETECTED NOT DETECTED Final   Candida krusei NOT DETECTED NOT DETECTED Final   Candida parapsilosis NOT DETECTED NOT DETECTED Final   Candida tropicalis  NOT DETECTED NOT DETECTED Final  Culture, blood (Routine X 2) w Reflex to ID Panel     Status: None (Preliminary result)   Collection Time: 09/07/16  7:04 PM  Result Value Ref Range Status   Specimen Description BLOOD LEFT ANTECUBITAL  Final   Special Requests   Final    BOTTLES DRAWN AEROBIC AND ANAEROBIC Blood Culture results may not be optimal due to an excessive volume of blood received in culture bottles   Culture NO GROWTH 2 DAYS  Final   Report Status PENDING  Incomplete  Culture, blood (Routine X 2) w Reflex to ID Panel     Status: None (Preliminary result)   Collection Time: 09/07/16  7:14 PM  Result Value Ref Range Status   Specimen Description BLOOD RIGHT ANTECUBITAL  Final   Special Requests   Final    BOTTLES DRAWN AEROBIC AND ANAEROBIC Blood Culture adequate volume   Culture NO GROWTH 2 DAYS  Final   Report Status PENDING  Incomplete         Radiology Studies: No results found.      Scheduled Meds: . apixaban  2.5 mg Oral BID  . carvedilol  3.125 mg Oral BID  . chlorhexidine gluconate (MEDLINE KIT)  15 mL Mouth Rinse BID  . escitalopram  10 mg Oral Daily  . gabapentin  100 mg Oral Daily  . levothyroxine  75 mcg Oral  QAC breakfast  . mouth rinse  15 mL Mouth Rinse QID  . pantoprazole  40 mg Oral QHS  . risperiDONE  0.5 mg Oral QHS   Continuous Infusions: . sodium chloride    . sodium chloride Stopped (09/07/16 1856)  . ampicillin-sulbactam (UNASYN) IV Stopped (09/10/16 0542)  . dextrose 10 mL/hr at 09/06/16 0800     LOS: 7 days        Aline August, MD [facility] Pager [phone]  If 7PM-7AM, please contact night-coverage [email or web address] Password [email or web address] 09/10/2016, 1:10 PM

## 2016-09-10 NOTE — Consult Note (Signed)
Consultation Note Date: 09/10/2016   Patient Name: [name] [name]  DOB: 11/21/1930  MRN: [id number]  Age / Sex: 83 y.o., female  PCP: Laurey Morale, MD Referring Physician: Aline August, MD  Reason for Consultation: Establishing goals of care, Hospice Evaluation and Psychosocial/spiritual support  HPI/Patient Profile: 83 y.o. female  with past medical history of Moderate dementia, chronic atrial fibrillation on anticoagulation, CHF, hypertension, hyperlipidemia, admitted on 09/03/2016 with altered mental status. She was admitted to ICU with septic shock and started on  antibiotics and IV levophed. Patient was found to have acute cholecystitis but was deemed by general surgery to be a poor candidate to undergo a cholecystectomy and a percutaneous biliary drain was placed by interventional radiology. Patient was intubated for respiratory failure but patient self extubated. Blood cultures grew Escherichia coli and an MSSA. Infectious disease was consulted. Repeat blood cultures from 09/07/2016 show no growth to date. She is been transferred now out of ICU to a MedSurg floor.   Palliative medicine consult and for goals of care in the setting of likely long-standing need for percutaneous biliary drain, IV antibiotics through 09/20/2016 then indefinite administration of Augmentin. Patient also had poor functional status prior to admission as it evidenced by lack of appetite, falls and memory deficits.   Clinical Assessment and Goals of Care: Met with patient today. There was no family at the bedside. She shares with me that she is married and has 2 daughters. One daughter lives locally and one daughter lives in [place] and she has one grandchild. She does have marketed short-term memory deficits and little appreciation and understanding of her current clinical condition. She is pleasantly confused and in no acute  distress.  She reports she and her husband make decisions regarding healthcare together as well as involving their daughters.  She  does reiterate that she is a DO NOT RESUSCITATE. Her daughter [name] [name] is HCPOA    SUMMARY OF RECOMMENDATIONS   DNR/DNI Palliative medicine provider to meet family at 2pm 09/10/16  to discuss relevant clinical issues in setting of indefinite percutaneous biliary drain, long term antibiotics and loss of appetite 2/2 to dementia. Code Status/Advance Care Planning:  DNR    Symptom Management:   Pain: Continue tramadol. Monitor for need for increased pain control and would recommend low dose oxycodone 2.5-'5mg'$  q4 PRN  Anxiety/depression: Cont lexapro and Risperdal per home meds  Palliative Prophylaxis:   Aspiration, Bowel Regimen, Delirium Protocol, Eye Care, Frequent Pain Assessment, Oral Care and Turn Reposition  Additional Recommendations (Limitations, Scope, Preferences):  Initiate Comfort Feeding, No Chemotherapy, No Radiation, No Surgical Procedures and No Tracheostomy  Psycho-social/Spiritual:   Desire for further Chaplaincy support:no  Additional Recommendations: Grief/Bereavement Support  Prognosis:   < 6 months in the setting of recent septic shock secondary to cholecystitis; patient is not a surgical candidate and has a percutaneous biliary drain likely indefinitely; long-term antibiotics, in an elderly female with moderate dementia, heart failure, atrial fibrillation, now with minimal oral intake. Patient is interested in some sips  of liquid but only eating bites of solid food  Discharge Planning: Patient likely does not meet residential hospice criteria of a prognosis of less than 2 weeks but she would qualify for hospice Medicare benefit in the home before facility.      Primary Diagnoses: Present on Admission: . Septic shock ([place])   I have reviewed the medical record, interviewed the patient and family, and examined the  patient. The following aspects are pertinent.  Past Medical History:  Diagnosis Date  . Anemia   . Anxiety   . Arthritis   . Atrial fibrillation ([place])   . CHF (congestive heart failure) ([place])   . Dementia   . Depression   . Diverticulosis of colon   . History of alcohol abuse   . Hyperlipidemia   . Hypertension   . IPF (idiopathic pulmonary fibrosis) ([facility])   . Irritable bowel syndrome   . OSA (obstructive sleep apnea)   . Osteoporosis   . PMR (polymyalgia rheumatica) ([facility])   . Postherpetic neuralgia   . Spinal stenosis   . Thyroid disease   . Vertebral fracture   . Vitamin D deficiency    Social History   Social History  . Marital status: Married    Spouse name: N/A  . Number of children: N/A  . Years of education: N/A   Social History Main Topics  . Smoking status: Former Smoker    Packs/day: 1.00    Years: 20.00    Types: Cigarettes    Quit date: 01/25/1988  . Smokeless tobacco: Never Used  . Alcohol use 0.0 oz/week     Comment: 2 glasses every day  . Drug use: No  . Sexual activity: Not Asked   Other Topics Concern  . None   Social History Narrative  . None   Family History  Problem Relation Age of Onset  . Stroke Father 50  . Heart attack Father   . Coronary artery disease Father 34  . Alcohol abuse Mother   . Breast cancer Sister   . Heart disease Sister   . Colon cancer Neg Hx    Scheduled Meds: . apixaban  2.5 mg Oral BID  . carvedilol  3.125 mg Oral BID  . chlorhexidine gluconate (MEDLINE KIT)  15 mL Mouth Rinse BID  . escitalopram  10 mg Oral Daily  . gabapentin  100 mg Oral Daily  . levothyroxine  75 mcg Oral QAC breakfast  . mouth rinse  15 mL Mouth Rinse QID  . pantoprazole  40 mg Oral QHS  . risperiDONE  0.5 mg Oral QHS   Continuous Infusions: . sodium chloride    . sodium chloride Stopped (09/07/16 1856)  . ampicillin-sulbactam (UNASYN) IV Stopped (09/10/16 0542)  . dextrose 10 mL/hr at 09/06/16 0800   PRN Meds:.sodium  chloride, traMADol Medications Prior to Admission:  Prior to Admission medications   Medication Sig Start Date End Date Taking? Authorizing Provider  acetaminophen (TYLENOL) 500 MG tablet Take 1,000 mg by mouth every 12 (twelve) hours as needed (moderate to severe pain).    Yes [provider]  carvedilol (COREG) 3.125 MG tablet TAKE 1 TABLET TWICE DAILY. Patient taking differently: TAKE 1 TABLET (3.125 MG) BY MOUTH TWICE DAILY. 02/02/16  Yes Laurey Morale, MD  ELIQUIS 2.5 MG TABS tablet TAKE 1 TABLET TWICE DAILY. Patient taking differently: TAKE 1 TABLET(2.5 MG) BY MOUTH TWICE DAILY. 03/21/16  Yes Laurey Morale, MD  ENSURE (ENSURE) Take by mouth 2 (two) times  daily.   Yes [provider]  escitalopram (LEXAPRO) 10 MG tablet Take 1 tablet (10 mg total) by mouth daily. 02/03/16  Yes Laurey Morale, MD  ferrous sulfate 325 (65 FE) MG tablet TAKE 1 TABLET TWICE DAILY. Patient taking differently: TAKE 1 TABLET (325 MG) BY MOUTH TWICE DAILY. 03/29/16  Yes Laurey Morale, MD  furosemide (LASIX) 40 MG tablet Take 1 tablet (40 mg total) by mouth daily. 11/24/15  Yes Johnson, Clanford L, MD  gabapentin (NEURONTIN) 100 MG capsule TAKE 1 CAPSULE EVERY DAY. Patient taking differently: TAKE 1 CAPSULE (100 MG) BY MOUTH EVERY DAY 02/22/16  Yes Laurey Morale, MD  levothyroxine (SYNTHROID, LEVOTHROID) 75 MCG tablet Take 75 mcg by mouth daily before breakfast.   Yes [provider]  magnesium oxide (MAG-OX) 400 MG tablet Take 800 mg by mouth daily. For constipation   Yes [provider]  pantoprazole (PROTONIX) 40 MG tablet Take 1 tablet (40 mg total) by mouth daily. 05/15/15  Yes Almyra Deforest, PA  potassium chloride (KLOR-CON) 20 MEQ packet Take 20 mEq by mouth 2 (two) times daily. 02/03/16  Yes Laurey Morale, MD  risperiDONE (RISPERDAL) 0.5 MG tablet Once daily at suppertime Patient taking differently: 0.5 mg daily with supper.  08/30/16  Yes Laurey Morale, MD  traMADol (ULTRAM) 50  MG tablet Take 2 tablets (100 mg total) by mouth every 6 (six) hours as needed for moderate pain. Patient taking differently: Take 100 mg by mouth every 6 (six) hours as needed for moderate pain or severe pain.  08/30/16  Yes Laurey Morale, MD   Allergies  Allergen Reactions  . Clindamycin Hcl Diarrhea  . Rofecoxib Other (See Comments)    "didn't agree with her" per daughter  . Celecoxib Other (See Comments)    "didn't agree with her" per daughter  . Sertraline Hcl Other (See Comments)    "didn't agree with her" per daughter   Review of Systems  Unable to perform ROS: Dementia    Physical Exam  Constitutional:  Frail, elderly female in no acute distress  HENT:  Head: Normocephalic and atraumatic.  Neck: Normal range of motion.  Cardiovascular: Normal rate.   Pulmonary/Chest: She has wheezes.  Mild increased work of breathing at rest  Abdominal:  Percutaneous drain  Neurological: She is alert.  She is oriented to herself, that she is in [facility] in [place], but is not clear as to her medical condition. Short-term memory deficits apparent  Skin: Skin is warm and dry. There is pallor.  Psychiatric:  Pleasantly confused Limited understanding of her underlying illness  Nursing note and vitals reviewed.   Vital Signs: BP 100/64 (BP Location: Right Arm)   Pulse (!) 110   Temp 98.4 F (36.9 C) (Oral)   Resp 18   Wt 60.8 kg (134 lb 1.6 oz)   SpO2 99%   BMI 21.00 kg/m  Pain Assessment: No/denies pain   Pain Score: 0-No pain   SpO2: SpO2: 99 % O2 Device:SpO2: 99 % O2 Flow Rate: .O2 Flow Rate (L/min): 2 L/min  IO: Intake/output summary:  Intake/Output Summary (Last 24 hours) at 09/10/16 1447 Last data filed at 09/10/16 0703  Gross per 24 hour  Intake              320 ml  Output              100 ml  Net  220 ml    LBM: Last BM Date: 09/10/16 Baseline Weight: Weight: 55.7 kg (122 lb 12.7 oz) Most recent weight: Weight: 60.8 kg (134 lb 1.6  oz)     Palliative Assessment/Data:   Flowsheet Rows     Most Recent Value  Intake Tab  Referral Department  Hospitalist  Unit at Time of Referral  Med/Surg Unit  Palliative Care Primary Diagnosis  Sepsis/Infectious Disease  Date Notified  09/09/16  Palliative Care Type  New Palliative care  Reason for referral  Clarify Goals of Care  Date of Admission  09/03/16  Date first seen by Palliative Care  09/10/16  # of days Palliative referral response time  1 Day(s)  # of days IP prior to Palliative referral  6  Clinical Assessment  Palliative Performance Scale Score  40%  Pain Max last 24 hours  Not able to report  Pain Min Last 24 hours  Not able to report  Dyspnea Max Last 24 Hours  Not able to report  Dyspnea Min Last 24 hours  Not able to report  Nausea Max Last 24 Hours  Not able to report  Nausea Min Last 24 Hours  Not able to report  Anxiety Max Last 24 Hours  Not able to report  Anxiety Min Last 24 Hours  Not able to report  Other Max Last 24 Hours  Not able to report  Psychosocial & Spiritual Assessment  Palliative Care Outcomes  Patient/Family meeting held?  Yes  Who was at the meeting?  met with pt,  attempting to set up family mtg  Patient/Family wishes: Interventions discontinued/not started   Trach, Mechanical Ventilation  Palliative Care follow-up planned  Yes, Facility      Time In: 1200 Time Out: 1310 Time Total: 70 min Greater than 50%  of this time was spent counseling and coordinating care related to the above assessment and plan. Staffed with Dr. Starla Link  Signed by: Dory Horn, NP   Please contact Palliative Medicine Team phone at [phone] for questions and concerns.  For individual provider: See Shea Evans

## 2016-09-11 DIAGNOSIS — K810 Acute cholecystitis: Secondary | ICD-10-CM

## 2016-09-11 LAB — CULTURE, BLOOD (ROUTINE X 2)
Culture: NO GROWTH
Special Requests: ADEQUATE

## 2016-09-11 MED ORDER — CHLORHEXIDINE GLUCONATE 0.12 % MT SOLN
OROMUCOSAL | Status: AC
  Administered 2016-09-11: 15 mL via OROMUCOSAL
  Filled 2016-09-11: qty 15

## 2016-09-11 NOTE — Progress Notes (Signed)
No d/c pending at this time.  Picc to be evaluated Monday for placeemnt.

## 2016-09-11 NOTE — Plan of Care (Signed)
Problem: Education: Goal: Knowledge of [facility] Education information/materials will improve Outcome: Progressing Pt will become more knowledgeable about her disease process during this hospitalization.

## 2016-09-11 NOTE — Progress Notes (Signed)
Patient ID: [name] [name], female   DOB: Sep 12, 1930, 83 y.o.   MRN: [id number]  PROGRESS NOTE    JEANANN BALINSKI  ERX:[id number] DOB: August 01, 1930 DOA: 09/03/2016 PCP: Laurey Morale, MD   Brief Narrative:  83 year old female with history of moderate dementia, chronic atrial fibrillation on anticoagulation, CHF, hypertension, hyperlipidemia presented on 09/03/2016 with fever and altered mental status and was admitted to ICU with septic shock and started on broad-spectrum antibiotics and intravenous Levophed. Patient was found to have acute cholecystitis for which general surgery was consulted. Patient was thought to be a poor candidate for surgery so patient underwent percutaneous biliary drain placed by interventional radiology. Patient initially was intubated for respiratory failure but patient self extubated. Blood cultures grew Escherichia coli and MSSA; ID was consulted. Central line was removed. Repeat blood cultures from 09/07/2016 are negative so far. Patient was transferred out of ICU.   Assessment & Plan:   Active Problems:   Septic shock (HCC)   Acute respiratory failure with hypoxia (HCC)   Glasgow coma scale total score 3-8 (HCC)   E coli bacteremia   Staphylococcus aureus bacteremia with sepsis ([place])   Cholangitis   Central line infection   Sepsis ([place])   Encounter for central line placement   Encounter for orogastric (OG) tube placement   Cholangiectasis   Pressure injury of skin   Palliative care by specialist   Acute calculus cholecystitisand cholangitis - Status post percutaneous biliary drain placed by IR. Continue biliary drain and follow further recommendations from IR regarding the care of drain. - Continue IV Unasyn as per ID recommendations for now - Follow LFTs  Septic shock - Present on admission. Resolved  Escherichia coli and MSSA bacteremia - Probably from cholecystitis. Central line has been removed. Repeat cultures from 09/07/2016 are negative so  far. - ID recommends to continue IV Unasyn to 09/20/2016 and start Augmentin from August 29 and continue till drain comes out - Repeat blood cultures are negative so far.   Status post acute hypoxic respiratory failure status post extubation - Currently stable  Chronic atrial fibrillation - Continuemetoprolol. Continue Eliquis  Leukocytosis - Resolved  dementia - Monitor mental status. Fall precautions - Nursing home placement  Goals of care - Palliative care has been consulted. Family meeting is supposed to happen this afternoon - I had a long conversation with patient's daughter and son-in-law at bedside yesterday afternoon and because of overall poor prognosis long-term, family members think that patient might benefit from hospice. We will follow further recommendations from palliative care. - We will not order daily labs for now   DVT prophylaxis:Eliquis Code Status:DO NOT RESUSCITATE  Family Communication:none at bedside  Disposition Plan: To be determined  Consultants:ID, surgery, IR, critical care   Procedures:IR guided percutaneous biliary drain placement on 09/05/2016  Antimicrobials:Zosyn from 09/03/2016-09/05/2016 Rocephin, Flagyl, Vancomycin 1 dose on 09/03/2016  Unasyn from 09/06/2016 onwards  Subjective: Patient seen and examined at bedside. She is sleepy, wakes up slightly but is confused.  Objective: Vitals:   09/10/16 0605 09/10/16 1422 09/10/16 2143 09/11/16 0631  BP: 108/63 100/64 119/83 102/63  Pulse: (!) 107 (!) 110 60 (!) 109  Resp: '16 18 16 16  '$ Temp: (!) 97.5 F (36.4 C) 98.4 F (36.9 C) 97.6 F (36.4 C) 97.6 F (36.4 C)  TempSrc: Oral Oral Oral Oral  SpO2: 96% 99% 99% 94%  Weight: 60.8 kg (134 lb 1.6 oz)   60.7 kg (133 lb 14.4 oz)    Intake/Output  Summary (Last 24 hours) at 09/11/16 1154 Last data filed at 09/11/16 0945  Gross per 24 hour  Intake              440 ml  Output              560 ml  Net              -120 ml   Filed Weights   09/09/16 0513 09/10/16 0605 09/11/16 0631  Weight: 61.1 kg (134 lb 12.8 oz) 60.8 kg (134 lb 1.6 oz) 60.7 kg (133 lb 14.4 oz)    Examination:  General exam: Appears calm and comfortable  Respiratory system: Bilateral decreased breath sound at bases Cardiovascular system: S1 & S2 heard, Rate controlled  Gastrointestinal system: Abdomen is nondistended, soft and nontender. Normal bowel sounds heard. Right sided percutaneous drain present Extremities: No cyanosis, clubbing, edema     Data Reviewed: I have personally reviewed following labs and imaging studies  CBC:  Recent Labs Lab 09/05/16 0352 09/06/16 0400 09/07/16 0300 09/09/16 0511 09/10/16 0415  WBC 14.5* 7.5 5.3 5.3 6.2  NEUTROABS 12.1*  --  3.0 2.1 2.8  HGB 8.8* 8.8* 9.8* 10.1* 10.6*  HCT 27.3* 27.3* 30.6* 31.3* 33.1*  MCV 101.5* 103.4* 103.0* 104.7* 104.1*  PLT 147* 136* 140* 155 425   Basic Metabolic Panel:  Recent Labs Lab 09/05/16 0352 09/06/16 0400 09/07/16 0300 09/09/16 0511 09/10/16 0415  NA 140 141 143 141 141  K 3.0* 3.7 3.6 3.5 4.0  CL 106 108 109 106 107  CO2 '27 27 25 30 24  '$ GLUCOSE 79 86 101* 85 83  BUN '10 11 8 9 6  '$ CREATININE 0.66 0.71 0.58 0.60 0.49  CALCIUM 7.9* 8.0* 8.3* 8.2* 8.4*  MG 2.2 1.9  --  1.8 1.8  PHOS 2.2* 2.3*  --   --   --    GFR: Estimated Creatinine Clearance: 50.2 mL/min (by C-G formula based on SCr of 0.49 mg/dL). Liver Function Tests:  Recent Labs Lab 09/05/16 0352 09/09/16 0511 09/10/16 0415  AST 80* 26 33  ALT 85* 39 38  ALKPHOS 254* 154* 149*  BILITOT 2.6* 1.5* 1.7*  PROT 5.3* 5.7* 6.1*  ALBUMIN 2.0* 2.1* 2.3*   No results for input(s): LIPASE, AMYLASE in the last 168 hours. No results for input(s): AMMONIA in the last 168 hours. Coagulation Profile:  Recent Labs Lab 09/05/16 0754  INR 1.97   Cardiac Enzymes: No results for input(s): CKTOTAL, CKMB, CKMBINDEX, TROPONINI in the last 168 hours. BNP (last 3 results) No  results for input(s): PROBNP in the last 8760 hours. HbA1C: No results for input(s): HGBA1C in the last 72 hours. CBG:  Recent Labs Lab 09/04/16 2024 09/05/16 0045 09/05/16 0411 09/06/16 0606 09/06/16 0622  GLUCAP 77 80 73 66 143*   Lipid Profile: No results for input(s): CHOL, HDL, LDLCALC, TRIG, CHOLHDL, LDLDIRECT in the last 72 hours. Thyroid Function Tests: No results for input(s): TSH, T4TOTAL, FREET4, T3FREE, THYROIDAB in the last 72 hours. Anemia Panel: No results for input(s): VITAMINB12, FOLATE, FERRITIN, TIBC, IRON, RETICCTPCT in the last 72 hours. Sepsis Labs:  Recent Labs Lab 09/05/16 0352 09/06/16 0400  PROCALCITON 4.27 2.59    Recent Results (from the past 240 hour(s))  Blood Culture (routine x 2)     Status: Abnormal   Collection Time: 09/03/16 11:36 AM  Result Value Ref Range Status   Specimen Description BLOOD LEFT FOREARM  Final   Special Requests  Final    BOTTLES DRAWN AEROBIC AND ANAEROBIC Blood Culture adequate volume   Culture  Setup Time   Final    GRAM NEGATIVE RODS IN BOTH AEROBIC AND ANAEROBIC BOTTLES CRITICAL RESULT CALLED TO, READ BACK BY AND VERIFIED WITH: K COOK 09/04/16 @ 0627 M VESTAL    Culture (A)  Final    ESCHERICHIA COLI SUSCEPTIBILITIES PERFORMED ON PREVIOUS CULTURE WITHIN THE LAST 5 DAYS. STAPHYLOCOCCUS AUREUS KLEBSIELLA PNEUMONIAE ORGANISM NOT VIABLE IN CULTURE.    Report Status 09/08/2016 FINAL  Final   Organism ID, Bacteria STAPHYLOCOCCUS AUREUS  Final      Susceptibility   Staphylococcus aureus - MIC*    CIPROFLOXACIN <=0.5 SENSITIVE Sensitive     ERYTHROMYCIN <=0.25 SENSITIVE Sensitive     GENTAMICIN <=0.5 SENSITIVE Sensitive     OXACILLIN <=0.25 SENSITIVE Sensitive     TETRACYCLINE <=1 SENSITIVE Sensitive     VANCOMYCIN <=0.5 SENSITIVE Sensitive     TRIMETH/SULFA <=10 SENSITIVE Sensitive     CLINDAMYCIN <=0.25 SENSITIVE Sensitive     RIFAMPIN <=0.5 SENSITIVE Sensitive     Inducible Clindamycin NEGATIVE Sensitive      * STAPHYLOCOCCUS AUREUS  Blood Culture ID Panel (Reflexed)     Status: Abnormal   Collection Time: 09/03/16 11:36 AM  Result Value Ref Range Status   Enterococcus species NOT DETECTED NOT DETECTED Final   Vancomycin resistance NOT DETECTED NOT DETECTED Final   Listeria monocytogenes NOT DETECTED NOT DETECTED Final   Staphylococcus species DETECTED (A) NOT DETECTED Final    Comment: CRITICAL RESULT CALLED TO, READ BACK BY AND VERIFIED WITH: K COOK 09/04/16 @ 0627 M VESTAL    Staphylococcus aureus DETECTED (A) NOT DETECTED Final    Comment: CRITICAL RESULT CALLED TO, READ BACK BY AND VERIFIED WITH: K COOK 09/04/16 @ 0627 M VESTAL    Methicillin resistance NOT DETECTED NOT DETECTED Final   Streptococcus species NOT DETECTED NOT DETECTED Final   Streptococcus agalactiae NOT DETECTED NOT DETECTED Final   Streptococcus pneumoniae NOT DETECTED NOT DETECTED Final   Streptococcus pyogenes NOT DETECTED NOT DETECTED Final   Acinetobacter baumannii NOT DETECTED NOT DETECTED Final   Enterobacteriaceae species DETECTED (A) NOT DETECTED Final    Comment: CRITICAL RESULT CALLED TO, READ BACK BY AND VERIFIED WITH: K COOK 09/04/16 @ 0627 M VESTAL    Enterobacter cloacae complex NOT DETECTED NOT DETECTED Final   Escherichia coli DETECTED (A) NOT DETECTED Final    Comment: CRITICAL RESULT CALLED TO, READ BACK BY AND VERIFIED WITH: K COOK 09/04/16 @ 0627 M VESTAL    Klebsiella oxytoca NOT DETECTED NOT DETECTED Final   Klebsiella pneumoniae DETECTED (A) NOT DETECTED Final    Comment: CRITICAL RESULT CALLED TO, READ BACK BY AND VERIFIED WITH: K COOK 09/04/16 @ 0627 M VESTAL    Proteus species NOT DETECTED NOT DETECTED Final   Serratia marcescens NOT DETECTED NOT DETECTED Final   Carbapenem resistance NOT DETECTED NOT DETECTED Final   Haemophilus influenzae NOT DETECTED NOT DETECTED Final   Neisseria meningitidis NOT DETECTED NOT DETECTED Final   Pseudomonas aeruginosa NOT DETECTED NOT DETECTED Final    Candida albicans NOT DETECTED NOT DETECTED Final   Candida glabrata NOT DETECTED NOT DETECTED Final   Candida krusei NOT DETECTED NOT DETECTED Final   Candida parapsilosis NOT DETECTED NOT DETECTED Final   Candida tropicalis NOT DETECTED NOT DETECTED Final  Blood Culture (routine x 2)     Status: Abnormal   Collection Time: 09/03/16 11:45 AM  Result  Value Ref Range Status   Specimen Description BLOOD LEFT FOREARM  Final   Special Requests   Final    IN PEDIATRIC BOTTLE Blood Culture results may not be optimal due to an excessive volume of blood received in culture bottles   Culture  Setup Time   Final    GRAM NEGATIVE RODS IN PEDIATRIC BOTTLE CRITICAL RESULT CALLED TO, READ BACK BY AND VERIFIED WITH: K COOK 09/04/16 @ 0627 M VESTAL    Culture ESCHERICHIA COLI (A)  Final   Report Status 09/07/2016 FINAL  Final   Organism ID, Bacteria ESCHERICHIA COLI  Final      Susceptibility   Escherichia coli - MIC*    AMPICILLIN 4 SENSITIVE Sensitive     CEFAZOLIN <=4 SENSITIVE Sensitive     CEFEPIME <=1 SENSITIVE Sensitive     CEFTAZIDIME <=1 SENSITIVE Sensitive     CEFTRIAXONE <=1 SENSITIVE Sensitive     CIPROFLOXACIN <=0.25 SENSITIVE Sensitive     GENTAMICIN <=1 SENSITIVE Sensitive     IMIPENEM <=0.25 SENSITIVE Sensitive     TRIMETH/SULFA <=20 SENSITIVE Sensitive     AMPICILLIN/SULBACTAM <=2 SENSITIVE Sensitive     PIP/TAZO <=4 SENSITIVE Sensitive     Extended ESBL NEGATIVE Sensitive     * ESCHERICHIA COLI  Urine culture     Status: None   Collection Time: 09/03/16 12:27 PM  Result Value Ref Range Status   Specimen Description URINE, RANDOM  Final   Special Requests NONE  Final   Culture NO GROWTH  Final   Report Status 09/04/2016 FINAL  Final  MRSA PCR Screening     Status: None   Collection Time: 09/03/16  4:42 PM  Result Value Ref Range Status   MRSA by PCR NEGATIVE NEGATIVE Final    Comment:        The GeneXpert MRSA Assay (FDA approved for NASAL specimens only), is one  component of a comprehensive MRSA colonization surveillance program. It is not intended to diagnose MRSA infection nor to guide or monitor treatment for MRSA infections.   Culture, blood (Routine X 2) w Reflex to ID Panel     Status: None   Collection Time: 09/05/16 12:28 PM  Result Value Ref Range Status   Specimen Description BLOOD RIGHT HAND  Final   Special Requests IN PEDIATRIC BOTTLE Blood Culture adequate volume  Final   Culture NO GROWTH 5 DAYS  Final   Report Status 09/10/2016 FINAL  Final  Culture, blood (Routine X 2) w Reflex to ID Panel     Status: None   Collection Time: 09/05/16 12:28 PM  Result Value Ref Range Status   Specimen Description BLOOD LEFT HAND  Final   Special Requests IN PEDIATRIC BOTTLE Blood Culture adequate volume  Final   Culture NO GROWTH 5 DAYS  Final   Report Status 09/10/2016 FINAL  Final  Body fluid culture     Status: None   Collection Time: 09/05/16  4:36 PM  Result Value Ref Range Status   Specimen Description BILE  Final   Special Requests NONE  Final   Gram Stain   Final    ABUNDANT WBC PRESENT, PREDOMINANTLY PMN ABUNDANT GRAM VARIABLE ROD    Culture   Final    RARE KLEBSIELLA PNEUMONIAE NO ANAEROBES ISOLATED    Report Status 09/10/2016 FINAL  Final   Organism ID, Bacteria KLEBSIELLA PNEUMONIAE  Final      Susceptibility   Klebsiella pneumoniae - MIC*    AMPICILLIN >=32 RESISTANT  Resistant     CEFAZOLIN <=4 SENSITIVE Sensitive     CEFEPIME <=1 SENSITIVE Sensitive     CEFTAZIDIME <=1 SENSITIVE Sensitive     CEFTRIAXONE <=1 SENSITIVE Sensitive     CIPROFLOXACIN <=0.25 SENSITIVE Sensitive     GENTAMICIN <=1 SENSITIVE Sensitive     IMIPENEM 0.5 SENSITIVE Sensitive     TRIMETH/SULFA <=20 SENSITIVE Sensitive     AMPICILLIN/SULBACTAM 4 SENSITIVE Sensitive     PIP/TAZO <=4 SENSITIVE Sensitive     Extended ESBL NEGATIVE Sensitive     * RARE KLEBSIELLA PNEUMONIAE  Culture, blood (Routine X 2) w Reflex to ID Panel     Status: None  (Preliminary result)   Collection Time: 09/06/16  7:59 AM  Result Value Ref Range Status   Specimen Description BLOOD RIGHT WRIST  Final   Special Requests IN PEDIATRIC BOTTLE Blood Culture adequate volume  Final   Culture NO GROWTH 4 DAYS  Final   Report Status PENDING  Incomplete  Culture, blood (Routine X 2) w Reflex to ID Panel     Status: Abnormal   Collection Time: 09/06/16  8:05 AM  Result Value Ref Range Status   Specimen Description BLOOD BLOOD LEFT HAND  Final   Special Requests IN PEDIATRIC BOTTLE Blood Culture adequate volume  Final   Culture  Setup Time   Final    GRAM POSITIVE COCCI IN CLUSTERS IN PEDIATRIC BOTTLE CRITICAL RESULT CALLED TO, READ BACK BY AND VERIFIED WITH: V.BRYK, PHARMD 09/07/16 0622 L.CHAMPION    Culture (A)  Final    STAPHYLOCOCCUS SPECIES (COAGULASE NEGATIVE) THE SIGNIFICANCE OF ISOLATING THIS ORGANISM FROM A SINGLE SET OF BLOOD CULTURES WHEN MULTIPLE SETS ARE DRAWN IS UNCERTAIN. PLEASE NOTIFY THE MICROBIOLOGY DEPARTMENT WITHIN ONE WEEK IF SPECIATION AND SENSITIVITIES ARE REQUIRED.    Report Status 09/09/2016 FINAL  Final  Blood Culture ID Panel (Reflexed)     Status: None   Collection Time: 09/06/16  8:05 AM  Result Value Ref Range Status   Enterococcus species NOT DETECTED NOT DETECTED Final   Vancomycin resistance NOT DETECTED NOT DETECTED Final   Listeria monocytogenes NOT DETECTED NOT DETECTED Final   Staphylococcus species NOT DETECTED NOT DETECTED Final   Staphylococcus aureus NOT DETECTED NOT DETECTED Final   Methicillin resistance NOT DETECTED NOT DETECTED Final   Streptococcus species NOT DETECTED NOT DETECTED Final   Streptococcus agalactiae NOT DETECTED NOT DETECTED Final   Streptococcus pneumoniae NOT DETECTED NOT DETECTED Final   Streptococcus pyogenes NOT DETECTED NOT DETECTED Final   Acinetobacter baumannii NOT DETECTED NOT DETECTED Final   Enterobacteriaceae species NOT DETECTED NOT DETECTED Final   Enterobacter cloacae complex  NOT DETECTED NOT DETECTED Final   Escherichia coli NOT DETECTED NOT DETECTED Final   Klebsiella oxytoca NOT DETECTED NOT DETECTED Final   Klebsiella pneumoniae NOT DETECTED NOT DETECTED Final   Proteus species NOT DETECTED NOT DETECTED Final   Serratia marcescens NOT DETECTED NOT DETECTED Final   Carbapenem resistance NOT DETECTED NOT DETECTED Final   Haemophilus influenzae NOT DETECTED NOT DETECTED Final   Neisseria meningitidis NOT DETECTED NOT DETECTED Final   Pseudomonas aeruginosa NOT DETECTED NOT DETECTED Final   Candida albicans NOT DETECTED NOT DETECTED Final   Candida glabrata NOT DETECTED NOT DETECTED Final   Candida krusei NOT DETECTED NOT DETECTED Final   Candida parapsilosis NOT DETECTED NOT DETECTED Final   Candida tropicalis NOT DETECTED NOT DETECTED Final  Culture, blood (Routine X 2) w Reflex to ID Panel  Status: None (Preliminary result)   Collection Time: 09/07/16  7:04 PM  Result Value Ref Range Status   Specimen Description BLOOD LEFT ANTECUBITAL  Final   Special Requests   Final    BOTTLES DRAWN AEROBIC AND ANAEROBIC Blood Culture results may not be optimal due to an excessive volume of blood received in culture bottles   Culture NO GROWTH 3 DAYS  Final   Report Status PENDING  Incomplete  Culture, blood (Routine X 2) w Reflex to ID Panel     Status: None (Preliminary result)   Collection Time: 09/07/16  7:14 PM  Result Value Ref Range Status   Specimen Description BLOOD RIGHT ANTECUBITAL  Final   Special Requests   Final    BOTTLES DRAWN AEROBIC AND ANAEROBIC Blood Culture adequate volume   Culture NO GROWTH 3 DAYS  Final   Report Status PENDING  Incomplete         Radiology Studies: No results found.      Scheduled Meds: . apixaban  2.5 mg Oral BID  . carvedilol  3.125 mg Oral BID  . chlorhexidine gluconate (MEDLINE KIT)  15 mL Mouth Rinse BID  . escitalopram  10 mg Oral Daily  . gabapentin  100 mg Oral Daily  . levothyroxine  75 mcg Oral  QAC breakfast  . mouth rinse  15 mL Mouth Rinse QID  . pantoprazole  40 mg Oral QHS  . risperiDONE  0.5 mg Oral QHS   Continuous Infusions: . sodium chloride    . sodium chloride Stopped (09/07/16 1856)  . ampicillin-sulbactam (UNASYN) IV Stopped (09/11/16 0532)  . dextrose 10 mL/hr at 09/06/16 0800     LOS: 8 days        Aline August, MD [facility] Pager [phone]  If 7PM-7AM, please contact night-coverage [email or web address] Password [facility] 09/11/2016, 11:54 AM

## 2016-09-11 NOTE — Progress Notes (Signed)
Daily Progress Note   Patient Name: [name] [name]       Date: 09/11/2016 DOB: 01-Jun-1930  Age: 83 y.o. MRN#: [id number] Attending Physician: Aline August, MD Primary Care Physician: Laurey Morale, MD Admit Date: 09/03/2016  Reason for Consultation/Follow-up: Establishing goals of care, Hospice Evaluation, Inpatient hospice referral and Psychosocial/spiritual support  Subjective: Met with patient, patient's husband [name] [name], and her daughter [name] [name] for goals of care discussion. Patient clearly has a moderate degree of dementia as evidenced by short-term memory deficits lack of interest in food, and observed difficulty swallowing. She does however have preserved social skills, in that she utilizes humor, polite conversation, to cope with but she does not understand or deflect. When you meet [name] [name], she will tell you everything is fine, but when not icteric directly engaged in conversation, he will observe lethargy, increased work of breathing at rest.  Length of Stay: 8  Current Medications: Scheduled Meds:  . apixaban  2.5 mg Oral BID  . carvedilol  3.125 mg Oral BID  . chlorhexidine gluconate (MEDLINE KIT)  15 mL Mouth Rinse BID  . escitalopram  10 mg Oral Daily  . gabapentin  100 mg Oral Daily  . levothyroxine  75 mcg Oral QAC breakfast  . mouth rinse  15 mL Mouth Rinse QID  . pantoprazole  40 mg Oral QHS  . risperiDONE  0.5 mg Oral QHS    Continuous Infusions: . sodium chloride    . sodium chloride Stopped (09/07/16 1856)  . ampicillin-sulbactam (UNASYN) IV Stopped (09/11/16 1344)  . dextrose 10 mL/hr at 09/06/16 0800    PRN Meds: sodium chloride, traMADol  Physical Exam  Constitutional:  Frail, elderly female; appears ill  HENT:  Head: Normocephalic and  atraumatic.  Neck: Normal range of motion.  Cardiovascular:  Irregular  Pulmonary/Chest:  Mild wheezing noted; increased work of breathing at rest  Neurological:  Patient is alert, oriented to herself, recognizes her family Short-term memory deficits present Does not understand the nature of her clinical condition  Skin: Skin is warm and dry. There is pallor.  Psychiatric:  Memory deficits present Poor insight secondary to moderate dementia  Nursing note and vitals reviewed.           Vital Signs: BP 97/66 (BP Location: Right Arm)   Pulse (!) 113  Temp 98 F (36.7 C)   Resp 16   Wt 60.7 kg (133 lb 14.4 oz)   SpO2 98%   BMI 20.97 kg/m  SpO2: SpO2: 98 % O2 Device: O2 Device: Nasal Cannula O2 Flow Rate: O2 Flow Rate (L/min): 2 L/min  Intake/output summary:  Intake/Output Summary (Last 24 hours) at 09/11/16 1653 Last data filed at 09/11/16 1500  Gross per 24 hour  Intake              940 ml  Output              560 ml  Net              380 ml   LBM: Last BM Date: 09/11/16 Baseline Weight: Weight: 55.7 kg (122 lb 12.7 oz) Most recent weight: Weight: 60.7 kg (133 lb 14.4 oz)       Palliative Assessment/Data:    Flowsheet Rows     Most Recent Value  Intake Tab  Referral Department  Hospitalist  Unit at Time of Referral  Med/Surg Unit  Palliative Care Primary Diagnosis  Sepsis/Infectious Disease  Date Notified  09/09/16  Palliative Care Type  New Palliative care  Reason for referral  Clarify Goals of Care  Date of Admission  09/03/16  Date first seen by Palliative Care  09/10/16  # of days Palliative referral response time  1 Day(s)  # of days IP prior to Palliative referral  6  Clinical Assessment  Palliative Performance Scale Score  40%  Pain Max last 24 hours  Not able to report  Pain Min Last 24 hours  Not able to report  Dyspnea Max Last 24 Hours  Not able to report  Dyspnea Min Last 24 hours  Not able to report  Nausea Max Last 24 Hours  Not able to  report  Nausea Min Last 24 Hours  Not able to report  Anxiety Max Last 24 Hours  Not able to report  Anxiety Min Last 24 Hours  Not able to report  Other Max Last 24 Hours  Not able to report  Psychosocial & Spiritual Assessment  Palliative Care Outcomes  Patient/Family meeting held?  Yes  Who was at the meeting?  met with pt,  attempting to set up family mtg  Patient/Family wishes: Interventions discontinued/not started   Trach, Mechanical Ventilation  Palliative Care follow-up planned  Yes, Facility      Patient Active Problem List   Diagnosis Date Noted  . Palliative care by specialist   . Pressure injury of skin 09/09/2016  . Encounter for central line placement   . Encounter for orogastric (OG) tube placement   . Cholangiectasis   . Sepsis ([place])   . Acute respiratory failure with hypoxia ([place])   . Glasgow coma scale total score 3-8 ([place])   . E coli bacteremia   . Staphylococcus aureus bacteremia with sepsis ([place])   . Cholangitis   . Central line infection   . Septic shock ([place]) 09/03/2016  . Thoracic spine pain 08/30/2016  . Compulsive skin picking 08/30/2016  . Permanent atrial fibrillation ([place]) 05/09/2016  . Dilated cardiomyopathy ([place]) 05/09/2016  . Dementia 02/04/2016  . Pulmonary edema 11/20/2015  . Chronic systolic heart failure ([place]) 11/20/2015  . Hyperbilirubinemia 11/20/2015  . Easy bruising 11/10/2015  . Bilateral leg edema 10/23/2015  . Urinary incontinence 10/23/2015  . PAF (paroxysmal atrial fibrillation) ([place]) 05/27/2015  . Fatigue 06/03/2014  . Hypothyroidism 04/02/2014  . Absolute anemia 01/01/2014  .  Idiopathic pulmonary fibrosis ([place]) 01/01/2014  . OA (osteoarthritis) of knee 07/02/2010  . Memory loss 07/02/2010  . Hyperlipidemia 03/08/2010  . Vitamin D deficiency 10/05/2009  . Obstructive sleep apnea 10/08/2007  . SYNDROME, CHRONIC PAIN 11/16/2006  . DIVERTICULOSIS, COLON 06/06/2006  . Depression 04/30/2006  . Essential hypertension  04/30/2006  . OSTEOPOROSIS 04/30/2006    Palliative Care Assessment & Plan   Patient Profile:  83 y.o. female  with past medical history of Moderate dementia, chronic atrial fibrillation on anticoagulation, CHF, hypertension, hyperlipidemia, pulmonary fibrosis, CHF admitted on 09/03/2016 with altered mental status. She was admitted to ICU with septic shock and started on  antibiotics and IV levophed. Patient was found to have acute cholecystitis but was deemed by general surgery to be a poor candidate to undergo a cholecystectomy and a percutaneous biliary drain was placed by interventional radiology. Patient was intubated for respiratory failure but patient self extubated. Blood cultures grew Escherichia coli and an MSSA. Infectious disease was consulted. Repeat blood cultures from 09/07/2016 show no growth to date. She is been transferred now out of ICU to a MedSurg floor.   Palliative medicine consult and for goals of care in the setting of likely long-standing need for percutaneous biliary drain, IV antibiotics through 09/20/2016 then indefinite administration of Augmentin. Patient also had poor functional status prior to admission as it evidenced by lack of appetite, falls and memory deficits.   Assessment: Had a lengthy conversation in the presence of the patient, with patient's husband, and daughter [name] [name]. Patient's daughter provided information on patient's poor functional status going back almost 2 years. She shares with me that they have seen Dr. Lake Bells in the past when she was diagnosed with pulmonary fibrosis approximately 2 years ago. At that time patient was given a potential prognosis of 2 years given the severity of her disease. During the same time period,  she went into heart failure, volume overload that required hospitalization as well as subsequent six-week rehabilitation at [facility]. She has never bounced back from these events either physically or mentally. Patient has  had chronic pain secondary to compression fractures and found rehabilitation painful, very difficult. Daughter reports she also has had an approx 40lb weight loos over the past year and is currently only eating bites and sips.  After staffing case with my supervising physician Dr. Hilma Favors, I did share with patient even with pursuing further IV antibiotics which would require her to be in a skilled nursing facility, followed by an indefinite period of oral antibiotics she remains at a very high risk of developing recurrent sepsis since she is not able to have a cholecystectomy. Patient's daughters, feel that she is not a rehabilitation candidate, she is  too frail and see her as being at end-of-life. Her husband understandably is struggling with coming to grips with the acuity of his wife's condition particularly in light of the "happy face" she puts on. Throughout our conversation today she will periodically interrupted and ask her husband if she could get him anything , "can I get you something to eat". She herself is deferring all decisions to whatever her family wants for her.  At the end of our discussion family, including husband, is leaning towards residential hospice if she can remain on oral antibiotics as long as she can swallow them  Recommendations/Plan:  Referral, report, given directly to [facility] liaison. They will be able to come and see patient on 09/12/2016. I believe if she is accepted  as a residential hospice patient and can continue oral antibiotics in the short-term, patient, family, are in agreement that this would be the best plan for her. Patient is agreeable  Palliative medicine to stay in touch with [facility] [place] in follow-up in terms of disposition  Consult placed to social work as well. Offered choice of hospice during our discussion today per Medicare guidelines  If she is not accepted to residential hospice, I anticipate family going  forward with SNF placement, continued IV antibiotics to the 28th (would likely need a PICC placed), followed by ongoing oral antibiotics. Patient and her husband currently reside in assisted living and returning to this level of care is not an option for her  Goals of Care and Additional Recommendations:  Limitations on Scope of Treatment: Avoid Hospitalization, Minimize Medications, No Artificial Feeding, No Chemotherapy, No Glucose Monitoring, No Hemodialysis, No Radiation, No Surgical Procedures and No Tracheostomy  Code Status:    Code Status Orders        Start     Ordered   09/03/16 1428  Do not attempt resuscitation (DNR)  Continuous    Question Answer Comment  In the event of cardiac or respiratory ARREST Do not call a "code blue"   In the event of cardiac or respiratory ARREST Do not perform Intubation, CPR, defibrillation or ACLS   In the event of cardiac or respiratory ARREST Use medication by any route, position, wound care, and other measures to relive pain and suffering. May use oxygen, suction and manual treatment of airway obstruction as needed for comfort.   Comments No CPR. All other interventions to be performed      09/03/16 1433    Code Status History    Date Active Date Inactive Code Status Order ID Comments User Context   11/20/2015  4:28 PM 11/24/2015  5:33 PM Full Code [id number]  Elgergawy, Silver Huguenin, MD ED       Prognosis:   < 2 weeks in the setting of recent bacteremia secondary to cholecystitis with high probability of recurrence; patient is not an operable candidate, percutaneous biliary drain placed; with comorbidities of moderate dementia, failure to thrive picture with 40 pound weight loss over the past year, weakness, immobility, falls, pulmonary fibrosis, dyspnea at rest, CHF  Discharge Planning:  Hospice facility  Care plan was discussed with Dr. Starla Link  Thank you for allowing the Palliative Medicine Team to assist in the care of this  patient.   Time In: 1400 Time Out: 1530 Total Time 90 min Prolonged Time Billed  yes       Greater than 50%  of this time was spent counseling and coordinating care related to the above assessment and plan.  Dory Horn, NP  Please contact Palliative Medicine Team phone at [phone] for questions and concerns.

## 2016-09-12 ENCOUNTER — Telehealth: Payer: Self-pay | Admitting: Family Medicine

## 2016-09-12 DIAGNOSIS — Z7189 Other specified counseling: Secondary | ICD-10-CM

## 2016-09-12 LAB — CULTURE, BLOOD (ROUTINE X 2)
CULTURE: NO GROWTH
Culture: NO GROWTH
Special Requests: ADEQUATE

## 2016-09-12 MED ORDER — TRAMADOL HCL 50 MG PO TABS: 50.0000 mg | ORAL_TABLET | Freq: Four times a day (QID) | ORAL | 0 refills | Status: AC | PRN

## 2016-09-12 MED ORDER — AMOXICILLIN-POT CLAVULANATE 875-125 MG PO TABS: 1.0000 | ORAL_TABLET | Freq: Two times a day (BID) | ORAL | 0 refills | Status: DC

## 2016-09-12 NOTE — Telephone Encounter (Signed)
Unable to reach someone from the office. Had to leave a vm to call back

## 2016-09-12 NOTE — [place] FL2 (Signed)
[place] MEDICAID FL2 LEVEL OF CARE SCREENING TOOL     IDENTIFICATION  Patient Name: [name] [name] Birthdate: 1930/11/11 Sex: female Admission Date (Current Location): 09/03/2016  [facility] and [place] Number:  Producer, television/film/video and Address:  [facility], [address] [place], [place] [address]      Provider Number: [id number]  Attending Physician Name and Address:  Glade Lloyd, MD  Relative Name and Phone Number:  Moises Blood, daughter, [phone]    Current Level of Care: Hospital Recommended Level of Care: Assisted Living Facility Prior Approval Number:    Date Approved/Denied:   PASRR Number: [id number]  Discharge Plan: Other (Comment) (ALF)    Current Diagnoses: Patient Active Problem List   Diagnosis Date Noted  . Palliative care by specialist   . Pressure injury of skin 09/09/2016  . Encounter for central line placement   . Encounter for orogastric (OG) tube placement   . Cholangiectasis   . Sepsis (H[facility])   . Acute respiratory failure with hypoxia (HCC)   . Glasgow coma scale total score 3-8 (H[facility])   . E coli bacteremia   . Staphylococcus aureus bacteremia with sepsis (HCC)   . Cholangitis   . Central line infection   . Septic shock (HCC) 09/03/2016  . Thoracic spine pain 08/30/2016  . Compulsive skin picking 08/30/2016  . Permanent atrial fibrillation (HCC) 05/09/2016  . Dilated cardiomyopathy (HCC) 05/09/2016  . Dementia 02/04/2016  . Pulmonary edema 11/20/2015  . Chronic systolic heart failure (HCC) 11/20/2015  . Hyperbilirubinemia 11/20/2015  . Easy bruising 11/10/2015  . Bilateral leg edema 10/23/2015  . Urinary incontinence 10/23/2015  . PAF (paroxysmal atrial fibrillation) (HCC) 05/27/2015  . Fatigue 06/03/2014  . Hypothyroidism 04/02/2014  . Absolute anemia 01/01/2014  . Idiopathic pulmonary fibrosis (HCC) 01/01/2014  . OA (osteoarthritis) of knee 07/02/2010  . Memory loss 07/02/2010  . Hyperlipidemia  03/08/2010  . Vitamin D deficiency 10/05/2009  . Obstructive sleep apnea 10/08/2007  . SYNDROME, CHRONIC PAIN 11/16/2006  . DIVERTICULOSIS, COLON 06/06/2006  . Depression 04/30/2006  . Essential hypertension 04/30/2006  . OSTEOPOROSIS 04/30/2006    Orientation RESPIRATION BLADDER Height & Weight     Self, Time, Situation, Place  O2 (Nasal cannula 2L) Incontinent Weight: 62.8 kg (138 lb 6.4 oz) Height:     BEHAVIORAL SYMPTOMS/MOOD NEUROLOGICAL BOWEL NUTRITION STATUS      Incontinent Diet (Regular-no added salt)  AMBULATORY STATUS COMMUNICATION OF NEEDS Skin   Extensive Assist Verbally PU Stage and Appropriate Care                       Personal Care Assistance Level of Assistance  Bathing, Feeding, Dressing Bathing Assistance: Maximum assistance Feeding assistance: Independent Dressing Assistance: Limited assistance     Functional Limitations Info             SPECIAL CARE FACTORS FREQUENCY  PT (By licensed PT)                   Contractures      Additional Factors Info  Code Status, Allergies, Psychotropic Code Status Info: DNR Allergies Info: Clindamycin Hcl, Rofecoxib, Celecoxib, Sertraline Hcl Psychotropic Info: Risperdal         Current Medications (09/12/2016):   Discharge Medications: TAKE these medications   acetaminophen 500 MG tablet Commonly known as:  TYLENOL Take 1,000 mg by mouth every 12 (twelve) hours as needed (moderate to severe pain).   amoxicillin-clavulanate 875-125 MG tablet  Commonly known as:  AUGMENTIN Take 1 tablet by mouth 2 (two) times daily. Start taking on:  09/21/2016   carvedilol 3.125 MG tablet Commonly known as:  COREG TAKE 1 TABLET TWICE DAILY. What changed:  See the new instructions.   ELIQUIS 2.5 MG Tabs tablet Generic drug:  apixaban TAKE 1 TABLET TWICE DAILY. What changed:  See the new instructions.   ENSURE Take by mouth 2 (two) times daily.   escitalopram 10 MG tablet Commonly known as:   LEXAPRO Take 1 tablet (10 mg total) by mouth daily.   ferrous sulfate 325 (65 FE) MG tablet TAKE 1 TABLET TWICE DAILY. What changed:  See the new instructions.   furosemide 40 MG tablet Commonly known as:  LASIX Take 1 tablet (40 mg total) by mouth daily.   gabapentin 100 MG capsule Commonly known as:  NEURONTIN TAKE 1 CAPSULE EVERY DAY. What changed:  See the new instructions.   levothyroxine 75 MCG tablet Commonly known as:  SYNTHROID, LEVOTHROID Take 75 mcg by mouth daily before breakfast.   magnesium oxide 400 MG tablet Commonly known as:  MAG-OX Take 800 mg by mouth daily. For constipation   pantoprazole 40 MG tablet Commonly known as:  PROTONIX Take 1 tablet (40 mg total) by mouth daily.   potassium chloride 20 MEQ packet Commonly known as:  KLOR-CON Take 20 mEq by mouth 2 (two) times daily.   risperiDONE 0.5 MG tablet Commonly known as:  RISPERDAL Once daily at suppertime What changed:  how much to take  when to take this  additional instructions   traMADol 50 MG tablet Commonly known as:  ULTRAM Take 1-2 tablets (50-100 mg total) by mouth every 6 (six) hours as needed for moderate pain or severe pain. What changed:  how much to take  reasons to take this      Relevant Imaging Results:  Relevant Lab Results:   Additional Information SSN [id number]     Hospice to follow patient at ALF.   Mearl Latin, LCSWA

## 2016-09-12 NOTE — Discharge Summary (Addendum)
Physician Discharge Summary  [name] [name] ZOX:[id number] DOB: [birth year] DOA: 09/03/2016  PCP: Nelwyn Salisbury, MD  Admit date: 09/03/2016 Discharge date: 09/12/2016  Admitted From: Assisted living facility  Disposition:  Assisted living facility  Recommendations for Outpatient Follow-up:  1. Follow up with PCP in 1 weeks 2. Follow-up with hospice at earliest convenience 3. Follow-up with interventional radiology as scheduled for the management of biliary drain  Home Health: No  Equipment/Devices: Percutaneous biliary drain  Discharge Condition: Poor  CODE STATUS: DO NOT RESUSCITATE  Diet recommendation: Heart Healthy   Brief/Interim Summary: 83 year old female with history of moderate dementia, chronic atrial fibrillation on anticoagulation, CHF, hypertension, hyperlipidemia presented on 09/03/2016 with fever and altered mental status and was admitted to ICU with septic shock and started on broad-spectrum antibiotics and intravenous Levophed. Patient was found to have acute cholecystitis for which general surgery was consulted. Patient was thought to be a poor candidate for surgery so patient underwent percutaneous biliary drain placed by interventional radiology. Patient initially was intubated for respiratory failure but patient self extubated. Blood cultures grew Escherichia coli and MSSA; ID was consulted. Central line was removed. Repeat blood cultures from 09/07/2016 are negative so far.  ID recommended at least 2 weeks of IV Unasyn and Augmentin afterwards till the patient has biliary drain. Palliative care was consulted because of overall poor prognosis. Family have agreed for the patient to be discharged back to the assisted living facility and have opted out of IV Unasyn. Patient will be evaluated by hospice at the assisted living facility. She'll be discharged on oral Augmentin till the patient has biliary drain.  ADDENDUM: Patient was not discharged on 09/12/2016 because of  logistics of discharge could not be arranged. Patient will be discharged on 09/13/2016 to assisted living facility and hospice will evaluate the patient there and manage the biliary drain as well. After discussion with the patient's daughter, Eliquis will be discontinued.  Discharge Diagnoses:  Active Problems:   Septic shock (HCC)   Acute respiratory failure with hypoxia (HCC)   Glasgow coma scale total score 3-8 (HCC)   E coli bacteremia   Staphylococcus aureus bacteremia with sepsis (HCC)   Cholangitis   Central line infection   Sepsis (HCC)   Encounter for central line placement   Encounter for orogastric (OG) tube placement   Cholangiectasis   Pressure injury of skin   Palliative care by specialist   Acute calculus cholecystitisand cholangitis - Status post percutaneous biliary drain placed by IR. Continue biliary drain outpatient follow up with IR regarding the care of drain. - Currently on IV Unasyn. ID recommended at least 2 weeks of IV Unasyn and Augmentin afterwards till the patient has biliary drain. - Palliative care was consulted because of overall poor prognosis. Family have agreed for the patient to be discharged back to the assisted living facility and have opted out of IV Unasyn. Patient will be evaluated by hospice at the assisted living facility. She'll be discharged on oral Augmentin till the patient has biliary drain.   Septic shock - Present on admission. Resolved  Escherichia coli and MSSA bacteremia - Probably from cholecystitis. Central line has been removed. Repeat cultures from 09/07/2016 are negative so far. - Plan as above  Status post acute hypoxic respiratory failure status post extubation - Currently stable  Chronic atrial fibrillation - Continue Coreg. Discontinue Eliquis after family discussion.  Leukocytosis - Resolved  dementia -  Fall precautions -Discharge back to assisted living facility  Goals of  care - As noted above.  If condition worsens, she would be a good candidate for comfort measures.   Discharge Instructions  Discharge Instructions    Call MD for:  difficulty breathing, headache or visual disturbances    Complete by:  As directed    Call MD for:  extreme fatigue    Complete by:  As directed    Call MD for:  hives    Complete by:  As directed    Call MD for:  persistant dizziness or light-headedness    Complete by:  As directed    Call MD for:  persistant nausea and vomiting    Complete by:  As directed    Call MD for:  severe uncontrolled pain    Complete by:  As directed    Call MD for:  temperature >100.4    Complete by:  As directed    Diet - low sodium heart healthy    Complete by:  As directed    Discharge instructions    Complete by:  As directed    Diet as tolerated   Increase activity slowly    Complete by:  As directed      Allergies as of 09/13/2016      Reactions   Clindamycin Hcl Diarrhea   Rofecoxib Other (See Comments)   "didn't agree with her" per daughter   Celecoxib Other (See Comments)   "didn't agree with her" per daughter   Sertraline Hcl Other (See Comments)   "didn't agree with her" per daughter      Medication List    STOP taking these medications   ELIQUIS 2.5 MG Tabs tablet Generic drug:  apixaban   ferrous sulfate 325 (65 FE) MG tablet   magnesium oxide 400 MG tablet Commonly known as:  MAG-OX     TAKE these medications   acetaminophen 500 MG tablet Commonly known as:  TYLENOL Take 1,000 mg by mouth every 12 (twelve) hours as needed (moderate to severe pain).   amoxicillin-clavulanate 400-57 MG/5ML suspension Commonly known as:  AUGMENTIN Take 10.9 mLs (872 mg total) by mouth every 12 (twelve) hours.   carvedilol 3.125 MG tablet Commonly known as:  COREG TAKE 1 TABLET TWICE DAILY. What changed:  See the new instructions.   ENSURE Take by mouth 2 (two) times daily.   escitalopram 10 MG tablet Commonly known as:  LEXAPRO Take 1  tablet (10 mg total) by mouth daily.   furosemide 40 MG tablet Commonly known as:  LASIX Take 1 tablet (40 mg total) by mouth daily.   gabapentin 100 MG capsule Commonly known as:  NEURONTIN TAKE 1 CAPSULE EVERY DAY. What changed:  See the new instructions.   levothyroxine 75 MCG tablet Commonly known as:  SYNTHROID, LEVOTHROID Take 75 mcg by mouth daily before breakfast.   pantoprazole 40 MG tablet Commonly known as:  PROTONIX Take 1 tablet (40 mg total) by mouth daily.   potassium chloride 20 MEQ packet Commonly known as:  KLOR-CON Take 20 mEq by mouth 2 (two) times daily.   risperiDONE 0.5 MG tablet Commonly known as:  RISPERDAL Once daily at suppertime What changed:  how much to take  when to take this  additional instructions   traMADol 50 MG tablet Commonly known as:  ULTRAM Take 1-2 tablets (50-100 mg total) by mouth every 6 (six) hours as needed for moderate pain or severe pain. What changed:  how much to take  reasons to take this  Contact information for follow-up providers    Richarda Overlie, MD Follow up in 5 week(s).   Specialty:  Interventional Radiology Why:  pt will hear from scheduler for time and date of 5 week follow up Contact information: [address] [place] [address] [phone]        [facility] Follow up.   Specialty:  Hospice and Palliative Medicine Why:  at earliest convenience Contact information: [address] [place] [address] [phone]            Contact information for after-discharge care    Destination    [facility] .   Specialty:  Skilled Nursing Facility Contact information: [address] [place] [address] [phone]                 Allergies  Allergen Reactions  . Clindamycin Hcl Diarrhea  . Rofecoxib Other (See Comments)    "didn't agree with her" per daughter  . Celecoxib Other (See Comments)     "didn't agree with her" per daughter  . Sertraline Hcl Other (See Comments)    "didn't agree with her" per daughter    Consultations:  ID, surgery, IR, critical care   Procedures/Studies: Dg Thoracic Spine W/swimmers  Result Date: 08/27/2016 CLINICAL DATA:  83 year old female with chronic mid back pain. EXAM: THORACIC SPINE - 3 VIEWS COMPARISON:  03/07/2016 and prior radiographs FINDINGS: Diffuse osteopenia noted. Compression fractures of T11, T12 and L1 are again noted with T12 vertebral augmentation changes again identified. No definite acute fracture or subluxation noted. No suspicious bony lesions are present. IMPRESSION: Diffuse osteopenia without definite acute bony abnormality. Chronic compression fractures of T11, T12 and L1. Electronically Signed   By: Harmon Pier M.D.   On: 08/27/2016 11:35   Ct Head Wo Contrast  Result Date: 09/03/2016 CLINICAL DATA:  Found down at nursing home, respiratory distress, altered level of consciousness, history hypertension, dementia, CHF, pulmonary fibrosis EXAM: CT HEAD WITHOUT CONTRAST TECHNIQUE: Contiguous axial images were obtained from the base of the skull through the vertex without intravenous contrast. Sagittal and coronal MPR images reconstructed from axial data set. COMPARISON:  03/07/2016 FINDINGS: Brain: Generalized atrophy. Normal ventricular morphology. No midline shift or mass effect. Minimal small vessel chronic ischemic changes of deep cerebral white matter. No intracranial hemorrhage, mass lesion, evidence of acute infarction, or extra-axial fluid collection. Vascular: Atherosclerotic calcification of internal carotid arteries at skullbase Skull: Intact Sinuses/Orbits: Clear.  Dependent fluid in the nasopharynx Other: N/A IMPRESSION: Prominent atrophy with minimal small vessel chronic ischemic changes of deep cerebral white matter. No acute intracranial abnormalities. Electronically Signed   By: Ulyses Southward M.D.   On: 09/03/2016 16:45   Ct  Abdomen Pelvis W Contrast  Result Date: 09/03/2016 CLINICAL DATA:  Abdominal pain.  Respiratory distress. EXAM: CT ABDOMEN AND PELVIS WITH CONTRAST TECHNIQUE: Multidetector CT imaging of the abdomen and pelvis was performed using the standard protocol following bolus administration of intravenous contrast. CONTRAST:  80mL ISOVUE-300 IOPAMIDOL (ISOVUE-300) INJECTION 61% COMPARISON:  Chest CT dated 02/07/2014. Right upper quadrant abdomen ultrasound dated 11/20/2015. FINDINGS: Lower chest: Again demonstrated are fibrotic changes with peripheral honeycombing at both lung bases. There is interval increased density in the posterior aspects of both lower lobes, greater on the right. No pleural fluid seen. Moderately large hiatal hernia. Enlarged heart. Small pericardial effusion with a maximum thickness of 11 mm. Extensive atheromatous coronary artery calcifications. Hepatobiliary: Interval diffuse low density of the  liver relative to the spleen. There is a linear branching area of increased enhancement in the right lobe of the liver on the initial images, not seen on the delayed images. There is extensive pericholecystic fluid and soft tissue stranding. Also demonstrated is marked dilatation of the cystic duct with mucosal enhancement and dilatation of the gallbladder with mucosal enhancement. No calcified gallstones visualized. No gallstones visualized on the previous ultrasound. Pancreas: Pancreatic atrophy. Spleen: Normal in size without focal abnormality. Adrenals/Urinary Tract: Adrenal glands are unremarkable. Kidneys are normal, without renal calculi, focal lesion, or hydronephrosis. Bladder is unremarkable. Stomach/Bowel: Moderately large hiatal hernia. No small bowel or colonic abnormalities demonstrated. No evidence of appendicitis, surgically absent by history. Vascular/Lymphatic: Arterial calcifications, including dense coronary artery calcifications. No aneurysm or enlarged lymph nodes. Reproductive: Small  uterus.  No adnexal masses. Other: Small amount of free peritoneal fluid. No abdominal wall hernia seen. Musculoskeletal: Multiple lumbar and lower thoracic vertebral compression deformities, some with kyphoplasty material. No acute fracture lines seen. Mild bilateral hip degenerative changes. IMPRESSION: 1. Findings compatible with cystic duct obstruction with marked changes of acute cholecystitis. A right upper quadrant abdomen ultrasound may provide useful additional information. 2. Small amount of free peritoneal fluid. 3. Diffuse hepatic steatosis and transient enhancement phenomenon in the right lobe. 4. Interval atelectasis or pneumonia in both lower lobes, greater on the right with stable underlying interstitial fibrosis and peripheral honeycombing. 5. Extensive dense calcified coronary artery atherosclerosis. 6. Moderately large hiatal hernia. Electronically Signed   By: Beckie Salts M.D.   On: 09/03/2016 17:10   Ir Perc Cholecystostomy  Result Date: 09/05/2016 INDICATION: 83 year old with acute cholecystitis. Sepsis and needs percutaneous drainage of the gallbladder. EXAM: PERCUTANEOUS CHOLECYSTOSTOMY TUBE PLACEMENT WITH ULTRASOUND AND FLUOROSCOPIC GUIDANCE MEDICATIONS: None ANESTHESIA/SEDATION: Fentanyl 25 mcg FLUOROSCOPY TIME:  Fluoroscopy Time: 1 minutes, 23 mGy COMPLICATIONS: None immediate. PROCEDURE: Informed written consent was obtained from the patient after a thorough discussion of the procedural risks, benefits and alternatives. All questions were addressed. Maximal Sterile Barrier Technique was utilized including caps, mask, sterile gowns, sterile gloves, sterile drape, hand hygiene and skin antiseptic. A timeout was performed prior to the initiation of the procedure. Gallbladder was identified with ultrasound. The right side of the abdomen was prepped and draped in sterile fashion. Skin was anesthetized with 1% lidocaine. 22 gauge Chiba needle was directed into the gallbladder with  ultrasound guidance. 0.018 wire was advanced into the gallbladder. Accustick dilator set was placed. J wire was advanced to the gallbladder. The tract was dilated to accommodate a 10 [place] multipurpose drain. Approximately 50 mL of brown fluid was removed from the gallbladder. Fluid was sent for culture. Catheter was sutured to skin. Drain was attached to gravity bag. Fluoroscopic and ultrasound images were taken and saved for documentation. FINDINGS: Dilated gallbladder with thick wall and a small amount of surrounding fluid. Tube was placed directly into the gallbladder. A transhepatic approach was not used due to the patient's anatomy. The gallbladder was decompressed at the end of the procedure. IMPRESSION: Successful percutaneous cholecystostomy tube placement with ultrasound and fluoroscopic guidance. Electronically Signed   By: Richarda Overlie M.D.   On: 09/05/2016 18:42   Dg Chest Port 1 View  Result Date: 09/03/2016 CLINICAL DATA:  83 year old female under evaluation for are central line placement. EXAM: PORTABLE CHEST 1 VIEW COMPARISON:  Chest x-ray 09/03/2016. FINDINGS: New left upper extremity PICC with tip terminating in the distal superior vena cava. An endotracheal tube is in place with tip 5.1  cm above the carina. Lung volumes are low. Diffuse interstitial prominence throughout the lungs bilaterally, most evident throughout the mid to lower lungs, compatible with underlying interstitial lung disease. No definite acute consolidative airspace disease. Lateral pleural fluid or pleural thickening noted in the right mid hemithorax. No definite left pleural effusion. No evidence of pulmonary edema. Heart size is borderline enlarged. Large hiatal hernia. Upper mediastinal contours are distorted by patient positioning. Aortic atherosclerosis. IMPRESSION: 1. Support apparatus, as above. 2. Chronic changes of interstitial lung disease, similar to prior studies, presumably from underlying usual interstitial  pneumonia (UIP). 3. Lateral pleural thickening or lateral loculated pleural fluid in the right mid hemithorax. 4. Aortic atherosclerosis. 5. Large hiatal hernia. Electronically Signed   By: Trudie Reed M.D.   On: 09/03/2016 19:29   Dg Chest Portable 1 View  Result Date: 09/03/2016 CLINICAL DATA:  Intubation, hypertension, CHF, idiopathic pulmonary fibrosis, atrial fibrillation EXAM: PORTABLE CHEST 1 VIEW COMPARISON:  Portable exam 1312 hours compared to 1138 hours FINDINGS: Tip of endotracheal tube projects 3.2 cm above carina. Stable heart size. Slightly rotated to the RIGHT which may account for prominence of the RIGHT hilum. Diffuse interstitial infiltrates compatible pulmonary fibrosis. No definite superimposed acute infiltrate, pleural effusion or pneumothorax. Bones demineralized. IMPRESSION: Pulmonary fibrosis. Endotracheal tube tip projects 3.2 cm above carina. Electronically Signed   By: Ulyses Southward M.D.   On: 09/03/2016 13:39   Dg Chest Port 1 View  Result Date: 09/03/2016 CLINICAL DATA:  Fever and altered mental status. EXAM: PORTABLE CHEST 1 VIEW COMPARISON:  03/07/2016 and CT 02/07/2014 FINDINGS: Patient slightly rotated to the right. Lungs are adequately inflated demonstrate diffuse predominant peripheral bilateral interstitial disease compatible with known pulmonary fibrosis. No focal confluent airspace disease. No definite effusion. Stable cardiomegaly. Moderate size hiatal hernia. Calcified plaque over the aortic arch. Remainder of the exam is unchanged. IMPRESSION: No acute cardiopulmonary disease. Evidence of known pulmonary fibrosis. Stable cardiomegaly. Aortic Atherosclerosis (ICD10-I70.0). Electronically Signed   By: Elberta Fortis M.D.   On: 09/03/2016 11:52   Dg Abd Portable 1v  Result Date: 09/04/2016 CLINICAL DATA:  Orogastric tube placement EXAM: PORTABLE ABDOMEN - 1 VIEW COMPARISON:  Same day CT abdomen and pelvis FINDINGS: The tip and side ports in a gastric tube are seen  below the left hemidiaphragm. There appears to be a large hiatal hernia causing leftward deviation of the gastric tube as it courses over the lower mediastinum. The heart is enlarged. There is aortic atherosclerosis with slight uncoiling of the thoracic aorta. Interstitial and alveolar airspace disease is noted at the lung bases left greater than right. The patient is status post the level kyphoplasty and what appears to be T12, L3 and L4. Numerous injection granulomas project over the buttocks bilaterally. IMPRESSION: 1. The tip and side port of a gastric tube are seen just below the left hemidiaphragm presumably in the stomach. There is a large hiatal hernia above the diaphragm. 2. There is cardiomegaly with aortic atherosclerosis and chronic interstitial and alveolar airspace disease. 3. Kyphoplasties of the lower thoracic and lumbar spine. Electronically Signed   By: Tollie Eth M.D.   On: 09/04/2016 00:50   US Abdomen Limited Ruq  Result Date: 09/03/2016 CLINICAL DATA:  Abnormal CT.  Cholecystitis. EXAM: ULTRASOUND ABDOMEN LIMITED RIGHT UPPER QUADRANT COMPARISON:  09/03/2016 CT FINDINGS: Gallbladder: Diffuse thickening and distention of the gallbladder with the gallbladder wall measuring up to 6 mm in thickness. There is biliary sludge and layering calculi along the dependent wall.  There is pericholecystic fluid. The patient is ventilated and therefore assessment for a sonographic Eulah Pont sign could not be performed. Common bile duct: Diameter: 4.4 mm Liver: No space-occupying mass of the included liver. No intrahepatic ductal dilatation. Small amount of free fluid is seen adjacent to the edge of the liver. Hepatopetal main portal vein flow on color Doppler imaging. IMPRESSION: Gallbladder distention with mural thickening, pericholecystic fluid, biliary sludge and calculi in keeping with changes of acute cholecystitis. Electronically Signed   By: Tollie Eth M.D.   On: 09/03/2016 21:36    IR guided  percutaneous biliary drain placement on 09/05/2016  Subjective:   Discharge Exam: Vitals:   09/12/16 0504 09/12/16 1346  BP: 105/69 126/78  Pulse: 90 (!) 110  Resp: 18 16  Temp: (!) 97.5 F (36.4 C) 97.9 F (36.6 C)  SpO2: 96% 98%   Vitals:   09/12/16 0148 09/12/16 0504 09/12/16 0640 09/12/16 1346  BP:  105/69  126/78  Pulse:  90  (!) 110  Resp:  18  16  Temp:  (!) 97.5 F (36.4 C)  97.9 F (36.6 C)  TempSrc:  Oral  Oral  SpO2: 92% 96%  98%  Weight:   62.8 kg (138 lb 6.4 oz)     General: Elderly female lying in bed awake but still slightly confused Cardiovascular: S1-S2 heard, rate controlled  Respiratory: Bilateral decreased breath sound at bases Abdominal: Soft, NT, ND, bowel sounds +; right-sided biliary drain present Extremities: no edema, no cyanosis    The results of significant diagnostics from this hospitalization (including imaging, microbiology, ancillary and laboratory) are listed below for reference.     Microbiology: Recent Results (from the past 240 hour(s))  Blood Culture (routine x 2)     Status: Abnormal   Collection Time: 09/03/16 11:36 AM  Result Value Ref Range Status   Specimen Description BLOOD LEFT FOREARM  Final   Special Requests   Final    BOTTLES DRAWN AEROBIC AND ANAEROBIC Blood Culture adequate volume   Culture  Setup Time   Final    GRAM NEGATIVE RODS IN BOTH AEROBIC AND ANAEROBIC BOTTLES CRITICAL RESULT CALLED TO, READ BACK BY AND VERIFIED WITH: K COOK 09/04/16 @ 0627 M VESTAL    Culture (A)  Final    ESCHERICHIA COLI SUSCEPTIBILITIES PERFORMED ON PREVIOUS CULTURE WITHIN THE LAST 5 DAYS. STAPHYLOCOCCUS AUREUS KLEBSIELLA PNEUMONIAE ORGANISM NOT VIABLE IN CULTURE.    Report Status 09/08/2016 FINAL  Final   Organism ID, Bacteria STAPHYLOCOCCUS AUREUS  Final      Susceptibility   Staphylococcus aureus - MIC*    CIPROFLOXACIN <=0.5 SENSITIVE Sensitive     ERYTHROMYCIN <=0.25 SENSITIVE Sensitive     GENTAMICIN <=0.5 SENSITIVE  Sensitive     OXACILLIN <=0.25 SENSITIVE Sensitive     TETRACYCLINE <=1 SENSITIVE Sensitive     VANCOMYCIN <=0.5 SENSITIVE Sensitive     TRIMETH/SULFA <=10 SENSITIVE Sensitive     CLINDAMYCIN <=0.25 SENSITIVE Sensitive     RIFAMPIN <=0.5 SENSITIVE Sensitive     Inducible Clindamycin NEGATIVE Sensitive     * STAPHYLOCOCCUS AUREUS  Blood Culture ID Panel (Reflexed)     Status: Abnormal   Collection Time: 09/03/16 11:36 AM  Result Value Ref Range Status   Enterococcus species NOT DETECTED NOT DETECTED Final   Vancomycin resistance NOT DETECTED NOT DETECTED Final   Listeria monocytogenes NOT DETECTED NOT DETECTED Final   Staphylococcus species DETECTED (A) NOT DETECTED Final    Comment: CRITICAL RESULT CALLED TO,  READ BACK BY AND VERIFIED WITH: K COOK 09/04/16 @ 0627 M VESTAL    Staphylococcus aureus DETECTED (A) NOT DETECTED Final    Comment: CRITICAL RESULT CALLED TO, READ BACK BY AND VERIFIED WITH: K COOK 09/04/16 @ 0627 M VESTAL    Methicillin resistance NOT DETECTED NOT DETECTED Final   Streptococcus species NOT DETECTED NOT DETECTED Final   Streptococcus agalactiae NOT DETECTED NOT DETECTED Final   Streptococcus pneumoniae NOT DETECTED NOT DETECTED Final   Streptococcus pyogenes NOT DETECTED NOT DETECTED Final   Acinetobacter baumannii NOT DETECTED NOT DETECTED Final   Enterobacteriaceae species DETECTED (A) NOT DETECTED Final    Comment: CRITICAL RESULT CALLED TO, READ BACK BY AND VERIFIED WITH: K COOK 09/04/16 @ 0627 M VESTAL    Enterobacter cloacae complex NOT DETECTED NOT DETECTED Final   Escherichia coli DETECTED (A) NOT DETECTED Final    Comment: CRITICAL RESULT CALLED TO, READ BACK BY AND VERIFIED WITH: K COOK 09/04/16 @ 0627 M VESTAL    Klebsiella oxytoca NOT DETECTED NOT DETECTED Final   Klebsiella pneumoniae DETECTED (A) NOT DETECTED Final    Comment: CRITICAL RESULT CALLED TO, READ BACK BY AND VERIFIED WITH: K COOK 09/04/16 @ 0627 M VESTAL    Proteus species NOT  DETECTED NOT DETECTED Final   Serratia marcescens NOT DETECTED NOT DETECTED Final   Carbapenem resistance NOT DETECTED NOT DETECTED Final   Haemophilus influenzae NOT DETECTED NOT DETECTED Final   Neisseria meningitidis NOT DETECTED NOT DETECTED Final   Pseudomonas aeruginosa NOT DETECTED NOT DETECTED Final   Candida albicans NOT DETECTED NOT DETECTED Final   Candida glabrata NOT DETECTED NOT DETECTED Final   Candida krusei NOT DETECTED NOT DETECTED Final   Candida parapsilosis NOT DETECTED NOT DETECTED Final   Candida tropicalis NOT DETECTED NOT DETECTED Final  Blood Culture (routine x 2)     Status: Abnormal   Collection Time: 09/03/16 11:45 AM  Result Value Ref Range Status   Specimen Description BLOOD LEFT FOREARM  Final   Special Requests   Final    IN PEDIATRIC BOTTLE Blood Culture results may not be optimal due to an excessive volume of blood received in culture bottles   Culture  Setup Time   Final    GRAM NEGATIVE RODS IN PEDIATRIC BOTTLE CRITICAL RESULT CALLED TO, READ BACK BY AND VERIFIED WITH: K COOK 09/04/16 @ 0627 M VESTAL    Culture ESCHERICHIA COLI (A)  Final   Report Status 09/07/2016 FINAL  Final   Organism ID, Bacteria ESCHERICHIA COLI  Final      Susceptibility   Escherichia coli - MIC*    AMPICILLIN 4 SENSITIVE Sensitive     CEFAZOLIN <=4 SENSITIVE Sensitive     CEFEPIME <=1 SENSITIVE Sensitive     CEFTAZIDIME <=1 SENSITIVE Sensitive     CEFTRIAXONE <=1 SENSITIVE Sensitive     CIPROFLOXACIN <=0.25 SENSITIVE Sensitive     GENTAMICIN <=1 SENSITIVE Sensitive     IMIPENEM <=0.25 SENSITIVE Sensitive     TRIMETH/SULFA <=20 SENSITIVE Sensitive     AMPICILLIN/SULBACTAM <=2 SENSITIVE Sensitive     PIP/TAZO <=4 SENSITIVE Sensitive     Extended ESBL NEGATIVE Sensitive     * ESCHERICHIA COLI  Urine culture     Status: None   Collection Time: 09/03/16 12:27 PM  Result Value Ref Range Status   Specimen Description URINE, RANDOM  Final   Special Requests NONE  Final    Culture NO GROWTH  Final   Report Status  09/04/2016 FINAL  Final  MRSA PCR Screening     Status: None   Collection Time: 09/03/16  4:42 PM  Result Value Ref Range Status   MRSA by PCR NEGATIVE NEGATIVE Final    Comment:        The GeneXpert MRSA Assay (FDA approved for NASAL specimens only), is one component of a comprehensive MRSA colonization surveillance program. It is not intended to diagnose MRSA infection nor to guide or monitor treatment for MRSA infections.   Culture, blood (Routine X 2) w Reflex to ID Panel     Status: None   Collection Time: 09/05/16 12:28 PM  Result Value Ref Range Status   Specimen Description BLOOD RIGHT HAND  Final   Special Requests IN PEDIATRIC BOTTLE Blood Culture adequate volume  Final   Culture NO GROWTH 5 DAYS  Final   Report Status 09/10/2016 FINAL  Final  Culture, blood (Routine X 2) w Reflex to ID Panel     Status: None   Collection Time: 09/05/16 12:28 PM  Result Value Ref Range Status   Specimen Description BLOOD LEFT HAND  Final   Special Requests IN PEDIATRIC BOTTLE Blood Culture adequate volume  Final   Culture NO GROWTH 5 DAYS  Final   Report Status 09/10/2016 FINAL  Final  Body fluid culture     Status: None   Collection Time: 09/05/16  4:36 PM  Result Value Ref Range Status   Specimen Description BILE  Final   Special Requests NONE  Final   Gram Stain   Final    ABUNDANT WBC PRESENT, PREDOMINANTLY PMN ABUNDANT GRAM VARIABLE ROD    Culture   Final    RARE KLEBSIELLA PNEUMONIAE NO ANAEROBES ISOLATED    Report Status 09/10/2016 FINAL  Final   Organism ID, Bacteria KLEBSIELLA PNEUMONIAE  Final      Susceptibility   Klebsiella pneumoniae - MIC*    AMPICILLIN >=32 RESISTANT Resistant     CEFAZOLIN <=4 SENSITIVE Sensitive     CEFEPIME <=1 SENSITIVE Sensitive     CEFTAZIDIME <=1 SENSITIVE Sensitive     CEFTRIAXONE <=1 SENSITIVE Sensitive     CIPROFLOXACIN <=0.25 SENSITIVE Sensitive     GENTAMICIN <=1 SENSITIVE Sensitive      IMIPENEM 0.5 SENSITIVE Sensitive     TRIMETH/SULFA <=20 SENSITIVE Sensitive     AMPICILLIN/SULBACTAM 4 SENSITIVE Sensitive     PIP/TAZO <=4 SENSITIVE Sensitive     Extended ESBL NEGATIVE Sensitive     * RARE KLEBSIELLA PNEUMONIAE  Culture, blood (Routine X 2) w Reflex to ID Panel     Status: None   Collection Time: 09/06/16  7:59 AM  Result Value Ref Range Status   Specimen Description BLOOD RIGHT WRIST  Final   Special Requests IN PEDIATRIC BOTTLE Blood Culture adequate volume  Final   Culture NO GROWTH 5 DAYS  Final   Report Status 09/11/2016 FINAL  Final  Culture, blood (Routine X 2) w Reflex to ID Panel     Status: Abnormal   Collection Time: 09/06/16  8:05 AM  Result Value Ref Range Status   Specimen Description BLOOD BLOOD LEFT HAND  Final   Special Requests IN PEDIATRIC BOTTLE Blood Culture adequate volume  Final   Culture  Setup Time   Final    GRAM POSITIVE COCCI IN CLUSTERS IN PEDIATRIC BOTTLE CRITICAL RESULT CALLED TO, READ BACK BY AND VERIFIED WITH: V.BRYK, PHARMD 09/07/16 0622 L.CHAMPION    Culture (A)  Final    STAPHYLOCOCCUS  SPECIES (COAGULASE NEGATIVE) THE SIGNIFICANCE OF ISOLATING THIS ORGANISM FROM A SINGLE SET OF BLOOD CULTURES WHEN MULTIPLE SETS ARE DRAWN IS UNCERTAIN. PLEASE NOTIFY THE MICROBIOLOGY DEPARTMENT WITHIN ONE WEEK IF SPECIATION AND SENSITIVITIES ARE REQUIRED.    Report Status 09/09/2016 FINAL  Final  Blood Culture ID Panel (Reflexed)     Status: None   Collection Time: 09/06/16  8:05 AM  Result Value Ref Range Status   Enterococcus species NOT DETECTED NOT DETECTED Final   Vancomycin resistance NOT DETECTED NOT DETECTED Final   Listeria monocytogenes NOT DETECTED NOT DETECTED Final   Staphylococcus species NOT DETECTED NOT DETECTED Final   Staphylococcus aureus NOT DETECTED NOT DETECTED Final   Methicillin resistance NOT DETECTED NOT DETECTED Final   Streptococcus species NOT DETECTED NOT DETECTED Final   Streptococcus agalactiae NOT DETECTED  NOT DETECTED Final   Streptococcus pneumoniae NOT DETECTED NOT DETECTED Final   Streptococcus pyogenes NOT DETECTED NOT DETECTED Final   Acinetobacter baumannii NOT DETECTED NOT DETECTED Final   Enterobacteriaceae species NOT DETECTED NOT DETECTED Final   Enterobacter cloacae complex NOT DETECTED NOT DETECTED Final   Escherichia coli NOT DETECTED NOT DETECTED Final   Klebsiella oxytoca NOT DETECTED NOT DETECTED Final   Klebsiella pneumoniae NOT DETECTED NOT DETECTED Final   Proteus species NOT DETECTED NOT DETECTED Final   Serratia marcescens NOT DETECTED NOT DETECTED Final   Carbapenem resistance NOT DETECTED NOT DETECTED Final   Haemophilus influenzae NOT DETECTED NOT DETECTED Final   Neisseria meningitidis NOT DETECTED NOT DETECTED Final   Pseudomonas aeruginosa NOT DETECTED NOT DETECTED Final   Candida albicans NOT DETECTED NOT DETECTED Final   Candida glabrata NOT DETECTED NOT DETECTED Final   Candida krusei NOT DETECTED NOT DETECTED Final   Candida parapsilosis NOT DETECTED NOT DETECTED Final   Candida tropicalis NOT DETECTED NOT DETECTED Final  Culture, blood (Routine X 2) w Reflex to ID Panel     Status: None   Collection Time: 09/07/16  7:04 PM  Result Value Ref Range Status   Specimen Description BLOOD LEFT ANTECUBITAL  Final   Special Requests   Final    BOTTLES DRAWN AEROBIC AND ANAEROBIC Blood Culture results may not be optimal due to an excessive volume of blood received in culture bottles   Culture NO GROWTH 5 DAYS  Final   Report Status 09/12/2016 FINAL  Final  Culture, blood (Routine X 2) w Reflex to ID Panel     Status: None   Collection Time: 09/07/16  7:14 PM  Result Value Ref Range Status   Specimen Description BLOOD RIGHT ANTECUBITAL  Final   Special Requests   Final    BOTTLES DRAWN AEROBIC AND ANAEROBIC Blood Culture adequate volume   Culture NO GROWTH 5 DAYS  Final   Report Status 09/12/2016 FINAL  Final     Labs: BNP (last 3 results)  Recent Labs   11/20/15 1120  BNP 752.6*   Basic Metabolic Panel:  Recent Labs Lab 09/06/16 0400 09/07/16 0300 09/09/16 0511 09/10/16 0415  NA 141 143 141 141  K 3.7 3.6 3.5 4.0  CL 108 109 106 107  CO2 27 25 30 24   GLUCOSE 86 101* 85 83  BUN 11 8 9 6   CREATININE 0.71 0.58 0.60 0.49  CALCIUM 8.0* 8.3* 8.2* 8.4*  MG 1.9  --  1.8 1.8  PHOS 2.3*  --   --   --    Liver Function Tests:  Recent Labs Lab 09/09/16 0511 09/10/16 0415  AST 26 33  ALT 39 38  ALKPHOS 154* 149*  BILITOT 1.5* 1.7*  PROT 5.7* 6.1*  ALBUMIN 2.1* 2.3*   No results for input(s): LIPASE, AMYLASE in the last 168 hours. No results for input(s): AMMONIA in the last 168 hours. CBC:  Recent Labs Lab 09/06/16 0400 09/07/16 0300 09/09/16 0511 09/10/16 0415  WBC 7.5 5.3 5.3 6.2  NEUTROABS  --  3.0 2.1 2.8  HGB 8.8* 9.8* 10.1* 10.6*  HCT 27.3* 30.6* 31.3* 33.1*  MCV 103.4* 103.0* 104.7* 104.1*  PLT 136* 140* 155 168   Cardiac Enzymes: No results for input(s): CKTOTAL, CKMB, CKMBINDEX, TROPONINI in the last 168 hours. BNP: Invalid input(s): POCBNP CBG:  Recent Labs Lab 09/06/16 0606 09/06/16 0622  GLUCAP 66 143*   D-Dimer No results for input(s): DDIMER in the last 72 hours. Hgb A1c No results for input(s): HGBA1C in the last 72 hours. Lipid Profile No results for input(s): CHOL, HDL, LDLCALC, TRIG, CHOLHDL, LDLDIRECT in the last 72 hours. Thyroid function studies No results for input(s): TSH, T4TOTAL, T3FREE, THYROIDAB in the last 72 hours.  Invalid input(s): FREET3 Anemia work up No results for input(s): VITAMINB12, FOLATE, FERRITIN, TIBC, IRON, RETICCTPCT in the last 72 hours. Urinalysis    Component Value Date/Time   COLORURINE AMBER (A) 09/03/2016 1227   APPEARANCEUR CLEAR 09/03/2016 1227   LABSPEC 1.018 09/03/2016 1227   PHURINE 6.0 09/03/2016 1227   GLUCOSEU NEGATIVE 09/03/2016 1227   HGBUR NEGATIVE 09/03/2016 1227   BILIRUBINUR NEGATIVE 09/03/2016 1227   BILIRUBINUR n 05/12/2015  1435   KETONESUR NEGATIVE 09/03/2016 1227   PROTEINUR 30 (A) 09/03/2016 1227   UROBILINOGEN 0.2 05/12/2015 1435   UROBILINOGEN 0.2 09/07/2010 1446   NITRITE NEGATIVE 09/03/2016 1227   LEUKOCYTESUR NEGATIVE 09/03/2016 1227   Sepsis Labs Invalid input(s): PROCALCITONIN,  WBC,  LACTICIDVEN Microbiology Recent Results (from the past 240 hour(s))  Blood Culture (routine x 2)     Status: Abnormal   Collection Time: 09/03/16 11:36 AM  Result Value Ref Range Status   Specimen Description BLOOD LEFT FOREARM  Final   Special Requests   Final    BOTTLES DRAWN AEROBIC AND ANAEROBIC Blood Culture adequate volume   Culture  Setup Time   Final    GRAM NEGATIVE RODS IN BOTH AEROBIC AND ANAEROBIC BOTTLES CRITICAL RESULT CALLED TO, READ BACK BY AND VERIFIED WITH: K COOK 09/04/16 @ 0627 M VESTAL    Culture (A)  Final    ESCHERICHIA COLI SUSCEPTIBILITIES PERFORMED ON PREVIOUS CULTURE WITHIN THE LAST 5 DAYS. STAPHYLOCOCCUS AUREUS KLEBSIELLA PNEUMONIAE ORGANISM NOT VIABLE IN CULTURE.    Report Status 09/08/2016 FINAL  Final   Organism ID, Bacteria STAPHYLOCOCCUS AUREUS  Final      Susceptibility   Staphylococcus aureus - MIC*    CIPROFLOXACIN <=0.5 SENSITIVE Sensitive     ERYTHROMYCIN <=0.25 SENSITIVE Sensitive     GENTAMICIN <=0.5 SENSITIVE Sensitive     OXACILLIN <=0.25 SENSITIVE Sensitive     TETRACYCLINE <=1 SENSITIVE Sensitive     VANCOMYCIN <=0.5 SENSITIVE Sensitive     TRIMETH/SULFA <=10 SENSITIVE Sensitive     CLINDAMYCIN <=0.25 SENSITIVE Sensitive     RIFAMPIN <=0.5 SENSITIVE Sensitive     Inducible Clindamycin NEGATIVE Sensitive     * STAPHYLOCOCCUS AUREUS  Blood Culture ID Panel (Reflexed)     Status: Abnormal   Collection Time: 09/03/16 11:36 AM  Result Value Ref Range Status   Enterococcus species NOT DETECTED NOT DETECTED Final   Vancomycin resistance  NOT DETECTED NOT DETECTED Final   Listeria monocytogenes NOT DETECTED NOT DETECTED Final   Staphylococcus species DETECTED (A)  NOT DETECTED Final    Comment: CRITICAL RESULT CALLED TO, READ BACK BY AND VERIFIED WITH: K COOK 09/04/16 @ 0627 M VESTAL    Staphylococcus aureus DETECTED (A) NOT DETECTED Final    Comment: CRITICAL RESULT CALLED TO, READ BACK BY AND VERIFIED WITH: K COOK 09/04/16 @ 0627 M VESTAL    Methicillin resistance NOT DETECTED NOT DETECTED Final   Streptococcus species NOT DETECTED NOT DETECTED Final   Streptococcus agalactiae NOT DETECTED NOT DETECTED Final   Streptococcus pneumoniae NOT DETECTED NOT DETECTED Final   Streptococcus pyogenes NOT DETECTED NOT DETECTED Final   Acinetobacter baumannii NOT DETECTED NOT DETECTED Final   Enterobacteriaceae species DETECTED (A) NOT DETECTED Final    Comment: CRITICAL RESULT CALLED TO, READ BACK BY AND VERIFIED WITH: K COOK 09/04/16 @ 0627 M VESTAL    Enterobacter cloacae complex NOT DETECTED NOT DETECTED Final   Escherichia coli DETECTED (A) NOT DETECTED Final    Comment: CRITICAL RESULT CALLED TO, READ BACK BY AND VERIFIED WITH: K COOK 09/04/16 @ 0627 M VESTAL    Klebsiella oxytoca NOT DETECTED NOT DETECTED Final   Klebsiella pneumoniae DETECTED (A) NOT DETECTED Final    Comment: CRITICAL RESULT CALLED TO, READ BACK BY AND VERIFIED WITH: K COOK 09/04/16 @ 0627 M VESTAL    Proteus species NOT DETECTED NOT DETECTED Final   Serratia marcescens NOT DETECTED NOT DETECTED Final   Carbapenem resistance NOT DETECTED NOT DETECTED Final   Haemophilus influenzae NOT DETECTED NOT DETECTED Final   Neisseria meningitidis NOT DETECTED NOT DETECTED Final   Pseudomonas aeruginosa NOT DETECTED NOT DETECTED Final   Candida albicans NOT DETECTED NOT DETECTED Final   Candida glabrata NOT DETECTED NOT DETECTED Final   Candida krusei NOT DETECTED NOT DETECTED Final   Candida parapsilosis NOT DETECTED NOT DETECTED Final   Candida tropicalis NOT DETECTED NOT DETECTED Final  Blood Culture (routine x 2)     Status: Abnormal   Collection Time: 09/03/16 11:45 AM  Result  Value Ref Range Status   Specimen Description BLOOD LEFT FOREARM  Final   Special Requests   Final    IN PEDIATRIC BOTTLE Blood Culture results may not be optimal due to an excessive volume of blood received in culture bottles   Culture  Setup Time   Final    GRAM NEGATIVE RODS IN PEDIATRIC BOTTLE CRITICAL RESULT CALLED TO, READ BACK BY AND VERIFIED WITH: K COOK 09/04/16 @ 0627 M VESTAL    Culture ESCHERICHIA COLI (A)  Final   Report Status 09/07/2016 FINAL  Final   Organism ID, Bacteria ESCHERICHIA COLI  Final      Susceptibility   Escherichia coli - MIC*    AMPICILLIN 4 SENSITIVE Sensitive     CEFAZOLIN <=4 SENSITIVE Sensitive     CEFEPIME <=1 SENSITIVE Sensitive     CEFTAZIDIME <=1 SENSITIVE Sensitive     CEFTRIAXONE <=1 SENSITIVE Sensitive     CIPROFLOXACIN <=0.25 SENSITIVE Sensitive     GENTAMICIN <=1 SENSITIVE Sensitive     IMIPENEM <=0.25 SENSITIVE Sensitive     TRIMETH/SULFA <=20 SENSITIVE Sensitive     AMPICILLIN/SULBACTAM <=2 SENSITIVE Sensitive     PIP/TAZO <=4 SENSITIVE Sensitive     Extended ESBL NEGATIVE Sensitive     * ESCHERICHIA COLI  Urine culture     Status: None   Collection Time: 09/03/16 12:27 PM  Result Value Ref Range Status   Specimen Description URINE, RANDOM  Final   Special Requests NONE  Final   Culture NO GROWTH  Final   Report Status 09/04/2016 FINAL  Final  MRSA PCR Screening     Status: None   Collection Time: 09/03/16  4:42 PM  Result Value Ref Range Status   MRSA by PCR NEGATIVE NEGATIVE Final    Comment:        The GeneXpert MRSA Assay (FDA approved for NASAL specimens only), is one component of a comprehensive MRSA colonization surveillance program. It is not intended to diagnose MRSA infection nor to guide or monitor treatment for MRSA infections.   Culture, blood (Routine X 2) w Reflex to ID Panel     Status: None   Collection Time: 09/05/16 12:28 PM  Result Value Ref Range Status   Specimen Description BLOOD RIGHT HAND  Final    Special Requests IN PEDIATRIC BOTTLE Blood Culture adequate volume  Final   Culture NO GROWTH 5 DAYS  Final   Report Status 09/10/2016 FINAL  Final  Culture, blood (Routine X 2) w Reflex to ID Panel     Status: None   Collection Time: 09/05/16 12:28 PM  Result Value Ref Range Status   Specimen Description BLOOD LEFT HAND  Final   Special Requests IN PEDIATRIC BOTTLE Blood Culture adequate volume  Final   Culture NO GROWTH 5 DAYS  Final   Report Status 09/10/2016 FINAL  Final  Body fluid culture     Status: None   Collection Time: 09/05/16  4:36 PM  Result Value Ref Range Status   Specimen Description BILE  Final   Special Requests NONE  Final   Gram Stain   Final    ABUNDANT WBC PRESENT, PREDOMINANTLY PMN ABUNDANT GRAM VARIABLE ROD    Culture   Final    RARE KLEBSIELLA PNEUMONIAE NO ANAEROBES ISOLATED    Report Status 09/10/2016 FINAL  Final   Organism ID, Bacteria KLEBSIELLA PNEUMONIAE  Final      Susceptibility   Klebsiella pneumoniae - MIC*    AMPICILLIN >=32 RESISTANT Resistant     CEFAZOLIN <=4 SENSITIVE Sensitive     CEFEPIME <=1 SENSITIVE Sensitive     CEFTAZIDIME <=1 SENSITIVE Sensitive     CEFTRIAXONE <=1 SENSITIVE Sensitive     CIPROFLOXACIN <=0.25 SENSITIVE Sensitive     GENTAMICIN <=1 SENSITIVE Sensitive     IMIPENEM 0.5 SENSITIVE Sensitive     TRIMETH/SULFA <=20 SENSITIVE Sensitive     AMPICILLIN/SULBACTAM 4 SENSITIVE Sensitive     PIP/TAZO <=4 SENSITIVE Sensitive     Extended ESBL NEGATIVE Sensitive     * RARE KLEBSIELLA PNEUMONIAE  Culture, blood (Routine X 2) w Reflex to ID Panel     Status: None   Collection Time: 09/06/16  7:59 AM  Result Value Ref Range Status   Specimen Description BLOOD RIGHT WRIST  Final   Special Requests IN PEDIATRIC BOTTLE Blood Culture adequate volume  Final   Culture NO GROWTH 5 DAYS  Final   Report Status 09/11/2016 FINAL  Final  Culture, blood (Routine X 2) w Reflex to ID Panel     Status: Abnormal   Collection Time:  09/06/16  8:05 AM  Result Value Ref Range Status   Specimen Description BLOOD BLOOD LEFT HAND  Final   Special Requests IN PEDIATRIC BOTTLE Blood Culture adequate volume  Final   Culture  Setup Time   Final    GRAM POSITIVE  COCCI IN CLUSTERS IN PEDIATRIC BOTTLE CRITICAL RESULT CALLED TO, READ BACK BY AND VERIFIED WITH: V.BRYK, PHARMD 09/07/16 0622 L.CHAMPION    Culture (A)  Final    STAPHYLOCOCCUS SPECIES (COAGULASE NEGATIVE) THE SIGNIFICANCE OF ISOLATING THIS ORGANISM FROM A SINGLE SET OF BLOOD CULTURES WHEN MULTIPLE SETS ARE DRAWN IS UNCERTAIN. PLEASE NOTIFY THE MICROBIOLOGY DEPARTMENT WITHIN ONE WEEK IF SPECIATION AND SENSITIVITIES ARE REQUIRED.    Report Status 09/09/2016 FINAL  Final  Blood Culture ID Panel (Reflexed)     Status: None   Collection Time: 09/06/16  8:05 AM  Result Value Ref Range Status   Enterococcus species NOT DETECTED NOT DETECTED Final   Vancomycin resistance NOT DETECTED NOT DETECTED Final   Listeria monocytogenes NOT DETECTED NOT DETECTED Final   Staphylococcus species NOT DETECTED NOT DETECTED Final   Staphylococcus aureus NOT DETECTED NOT DETECTED Final   Methicillin resistance NOT DETECTED NOT DETECTED Final   Streptococcus species NOT DETECTED NOT DETECTED Final   Streptococcus agalactiae NOT DETECTED NOT DETECTED Final   Streptococcus pneumoniae NOT DETECTED NOT DETECTED Final   Streptococcus pyogenes NOT DETECTED NOT DETECTED Final   Acinetobacter baumannii NOT DETECTED NOT DETECTED Final   Enterobacteriaceae species NOT DETECTED NOT DETECTED Final   Enterobacter cloacae complex NOT DETECTED NOT DETECTED Final   Escherichia coli NOT DETECTED NOT DETECTED Final   Klebsiella oxytoca NOT DETECTED NOT DETECTED Final   Klebsiella pneumoniae NOT DETECTED NOT DETECTED Final   Proteus species NOT DETECTED NOT DETECTED Final   Serratia marcescens NOT DETECTED NOT DETECTED Final   Carbapenem resistance NOT DETECTED NOT DETECTED Final   Haemophilus influenzae  NOT DETECTED NOT DETECTED Final   Neisseria meningitidis NOT DETECTED NOT DETECTED Final   Pseudomonas aeruginosa NOT DETECTED NOT DETECTED Final   Candida albicans NOT DETECTED NOT DETECTED Final   Candida glabrata NOT DETECTED NOT DETECTED Final   Candida krusei NOT DETECTED NOT DETECTED Final   Candida parapsilosis NOT DETECTED NOT DETECTED Final   Candida tropicalis NOT DETECTED NOT DETECTED Final  Culture, blood (Routine X 2) w Reflex to ID Panel     Status: None   Collection Time: 09/07/16  7:04 PM  Result Value Ref Range Status   Specimen Description BLOOD LEFT ANTECUBITAL  Final   Special Requests   Final    BOTTLES DRAWN AEROBIC AND ANAEROBIC Blood Culture results may not be optimal due to an excessive volume of blood received in culture bottles   Culture NO GROWTH 5 DAYS  Final   Report Status 09/12/2016 FINAL  Final  Culture, blood (Routine X 2) w Reflex to ID Panel     Status: None   Collection Time: 09/07/16  7:14 PM  Result Value Ref Range Status   Specimen Description BLOOD RIGHT ANTECUBITAL  Final   Special Requests   Final    BOTTLES DRAWN AEROBIC AND ANAEROBIC Blood Culture adequate volume   Culture NO GROWTH 5 DAYS  Final   Report Status 09/12/2016 FINAL  Final     Time coordinating discharge: 35 minutes  SIGNED:   Glade Lloyd, MD  [facility] 09/12/2016, 3:12 PM Pager: [phone]  If 7PM-7AM, please contact night-coverage [email or web address] Password [email or web address]

## 2016-09-12 NOTE — Progress Notes (Signed)
Patient's family would like her to discharge back to [facility] with her husband and [facility] following her there. She will dc with PO meds.   Osborne Casco Merlene Dante LCSWA [phone]

## 2016-09-12 NOTE — Telephone Encounter (Signed)
Yes I will agree to be her primary attending for Hospice

## 2016-09-12 NOTE — Progress Notes (Signed)
[facility] now stating they must come assess patient before return. RN will come to hospital tonight to determine.   If patient is able to discharge back to ALF tonight, please contact [facility] ([phone]) to schedule transport and give report to patient's RN at [phone]. Form on the chart.   Osborne Casco Pacer Dorn LCSWA [phone]

## 2016-09-12 NOTE — Care Management Important Message (Signed)
Important Message  Patient Details  Name: [name] [name] MRN: [id number] Date of Birth: 1930-07-23   Medicare Important Message Given:  Yes    Kyla Balzarine 09/12/2016, 11:36 AM

## 2016-09-12 NOTE — Telephone Encounter (Signed)
Please advise on previous message.  

## 2016-09-12 NOTE — Care Management Note (Addendum)
Case Management Note  Patient Details  Name: [name] [name] MRN: [id number] Date of Birth: 1930-11-09  Subjective/Objective:   Sepsis, Staphylococcus aureus bacteremia with sepsis, acute calculus, resp failure                Action/Plan: Discharge Planning: Chart reviewed. Scheduled dc to [facility] with [facility]. CSW following for return back to ALF.   PCP Nelwyn Salisbury MD  Expected Discharge Date:  09/12/16               Expected Discharge Plan:  Assisted Living / Rest Home  In-House Referral:  Clinical Social Work  Discharge planning Services  CM Consult  Post Acute Care Choice:  NA Choice offered to:  NA  DME Arranged:  N/A DME Agency:  NA  HH Arranged:  NA HH Agency:  NA  Status of Service:  Completed, signed off  If discussed at Long Length of Stay Meetings, dates discussed:  09/13/2016  Additional Comments:  Elliot Cousin, RN 09/12/2016, 3:45 PM

## 2016-09-12 NOTE — Consult Note (Addendum)
[facility]: Met with pt and her daughter. Pt is sitting in recliner chair. She engages in conversation and answer most questions appropriately. She does need assistance of one to transfer to chair.  Long discussion about pt's goals and what she would like. She is able to take medications by mouth and is in agreement to switching to oral medications once she is discharged. She is doing well with eating. Speech was in and I watched pt eat a grill cheese and 25% of her lunch and she appeared to be doing well. She had no noted signs of aspiration. She does appear to have SOB with exertion of eating. I let the MSW know there wishes and she confirmed with the facility that pt could come back there to the facility with hospice care. The pt is also noted to be very happy and appreciative of this option. She states that this makes her so happy because her husband lives there with her. The daughter is grateful for the option because she doesn't want her mother to have to go to skilled nursing and feels that rehab would be torture for her mother.  Spoke to Sarah at [facility] and she feels like this is an option as well as long a facility will take her back. [facility] will manage the biliary drain. Thank you for this referral and the opportunity to serve out community.  [facility] is pre-parred to send nurse out tomorrow after pt is discharged from hospital. WE have contacted her PCP to get hospice orders and also the facility to line up equipment needs etc. To be in place to not delay discharge tomorrow. Webb Silversmith RN

## 2016-09-12 NOTE — Plan of Care (Signed)
Problem: SLP Dysphagia Goals Goal: Patient will utilize recommended strategies Patient will utilize recommended strategies during swallow to increase swallowing safety with  Outcome: Not Met (add Reason) Discharging to ALP with hospice

## 2016-09-12 NOTE — Progress Notes (Signed)
Palliative Care  Mrs. Burress was a bit tired today but in good spirits. She remains very socially interactive, though clearly there are baseline cognitive deficits with poor memory and decreased processing. Today she relates that she is feeling a bit worn out, but is otherwise without complaints. She did manage to eat a bit more food today. Assessment wise she appears comfortable in her bedside chair. Mild SOB noted with prolonged conversation. She is quite pale and frail. Biliary drain noted and draining.   I met with the patient, her daughter, and Webb Silversmith (RN with [facility]). After discussing discharge options, the plan was made for the patient to return to her ALF with Hospice following there. SW confirmed the facility would accept her back with Hospice. I also confirmed the plan to stop IV antibiotics but continue oral ones. The patient and her daughter agreed.   No further Palliative Care needs at this point. Discharged expected either today or tomorrow.  Total time: 15 minutes Greater than 50%  of this time was spent counseling and coordinating care related to the above assessment and plan.   Charlynn Court [facility] Palliative [facility] [phone]

## 2016-09-12 NOTE — Progress Notes (Signed)
Physical Therapy Treatment Patient Details Name: [name] [name] MRN: [id number] DOB: 02-17-1930 Today's Date: 09/12/2016    History of Present Illness 83 y.o. female admitted with fever, apnea, intubated 8/11-8/12/18. Cholecystitis, drain placed 8/13. She has a  past medical history of Anemia; Anxiety; Arthritis; Atrial fibrillation (HCC); CHF (congestive heart failure) (HCC); Dementia; Depression; Diverticulosis of colon; History of alcohol abuse; Hyperlipidemia; Hypertension; IPF (idiopathic pulmonary fibrosis) (HCC); Irritable bowel syndrome; OSA (obstructive sleep apnea); Osteoporosis; PMR (polymyalgia rheumatica) (HCC); Postherpetic neuralgia; Spinal stenosis; Thyroid disease; Vertebral fracture; and Vitamin D deficiency.    PT Comments    Pt expressed desire to get OOB to chair and though she had declined lunch when in bed, she wanted to try to eat once she was sitting in chair. Pt required mod A for bed mobility as well as SPT to chair. PT will continue to follow as long as it seems beneficial to pt.     Follow Up Recommendations  SNF     Equipment Recommendations  None recommended by PT    Recommendations for Other Services       Precautions / Restrictions Precautions Precautions: Fall Precaution Comments: R biliary drain Restrictions Weight Bearing Restrictions: No    Mobility  Bed Mobility Overal bed mobility: Needs Assistance Bed Mobility: Rolling;Sidelying to Sit Rolling: Mod assist Sidelying to sit: Mod assist       General bed mobility comments: mod A to initiate movement and to raise trunk  Transfers Overall transfer level: Needs assistance Equipment used: None Transfers: Sit to/from [employer] Sit to Stand: Mod assist Stand pivot transfers: Mod assist       General transfer comment: pt with back pain in sitting, seemed to be better once standing. Mod A for power up and to maintain standing. Pt stood x60 secs for bowel clean up by RN  before pivoting to chair. Small steps, pt reaching for far side arm rest of chair  Ambulation/Gait             General Gait Details: unable   Stairs            Wheelchair Mobility    Modified Rankin (Stroke Patients Only)       Balance Overall balance assessment: Needs assistance Sitting-balance support: Feet supported;Single extremity supported Sitting balance-Leahy Scale: Fair     Standing balance support: Bilateral upper extremity supported Standing balance-Leahy Scale: Poor Standing balance comment: needs mod A to maintain standing                            Cognition Arousal/Alertness: Awake/alert Behavior During Therapy: WFL for tasks assessed/performed Overall Cognitive Status: History of cognitive impairments - at baseline                                 General Comments: pt oriented to self and location but not time      Exercises      General Comments General comments (skin integrity, edema, etc.): pt said she did not want her lunch when she was lying in bed but once in chair she said she felt like trying to eat it      Pertinent Vitals/Pain Pain Assessment: Faces Faces Pain Scale: Hurts even more Pain Location: back Pain Descriptors / Indicators: Discomfort Pain Intervention(s): Limited activity within patient's tolerance;Monitored during session    Home Living  Prior Function            PT Goals (current goals can now be found in the care plan section) Acute Rehab PT Goals Patient Stated Goal: none stated PT Goal Formulation: Patient unable to participate in goal setting Time For Goal Achievement: 09/22/16 Potential to Achieve Goals: Fair Progress towards PT goals: Progressing toward goals    Frequency    Min 2X/week      PT Plan Frequency needs to be updated    Co-evaluation              AM-PAC PT "6 Clicks" Daily Activity  Outcome Measure  Difficulty  turning over in bed (including adjusting bedclothes, sheets and blankets)?: Unable Difficulty moving from lying on back to sitting on the side of the bed? : Unable Difficulty sitting down on and standing up from a chair with arms (e.g., wheelchair, bedside commode, etc,.)?: Unable Help needed moving to and from a bed to chair (including a wheelchair)?: A Lot Help needed walking in hospital room?: Total Help needed climbing 3-5 steps with a railing? : Total 6 Click Score: 7    End of Session Equipment Utilized During Treatment: Oxygen Activity Tolerance: Patient tolerated treatment well Patient left: in chair;with call bell/phone within reach Nurse Communication: Mobility status PT Visit Diagnosis: Difficulty in walking, not elsewhere classified (R26.2);Pain Pain - part of body:  (back)     Time: 4098-1191 PT Time Calculation (min) (ACUTE ONLY): 13 min  Charges:  $Therapeutic Activity: 8-22 mins                    G Codes:       Lyanne Co, PT  Acute Rehab Services  [phone]    Turkey L Lopez Dentinger 09/12/2016, 1:40 PM

## 2016-09-12 NOTE — Progress Notes (Signed)
CSW received consult regarding residential hospice and that [facility] will come assess patient today. CSW to continue to follow for discharge needs.  Osborne Casco Rhyse Skowron LCSWA [phone]

## 2016-09-12 NOTE — Progress Notes (Signed)
  Speech Language Pathology Treatment: Dysphagia  Patient Details Name: [name] [name] MRN: [id number] DOB: 12-Jul-1930 Today's Date: 09/12/2016 Time: 1050-1110 SLP Time Calculation (min) (ACUTE ONLY): 20 min  Assessment / Plan / Recommendation Clinical Impression  Skilled observation revealed a functional oropharyngeal swallow with no overt indication of aspiration, moderate cueing for use of compensatory strategies including alternation of solid and liquid to aid in pharyngeal clearance. Noted mildly increased WOB with mastication of regular texture solids. Discussed this as well as swallowing strategies with patient and daughter. Given limited po intake, advise against diet downgrade to allow for as many choices as possible. Would however consider choosing softer solids if possible to aid in ease of mastication and decrease fatigue during po intake, maximizing po intake. Patient appears to be tolerating diet although continues to require cueing for use of strategies. Per RN, plan is to d/c to ALF with hospice following. No further acute SLP needs indicated. HH SLP per patient/family request.    HPI HPI: Pt is an 83 y.o.female who at baseline suffers from moderate dementia, manifested as memory loss, frequent falls and indifference to food. She was brought in for fever and AMS felt to be related to acute calculous cholecystitis s/p drain placement 8/13. She was intubated 8/11-8/12 when she self-extubated. She was started on clear liquids 8/14 but was observed to be coughing. She was made NPO pending SLP evaluation. PMH also includes: OSA, HTN, HLD, h/o alcohol abuse, depression, CHF, a fib, anxiety      SLP Plan  All goals met       Recommendations  Diet recommendations: Regular;Thin liquid Liquids provided via: Cup;Straw Medication Administration: Whole meds with puree Supervision: Patient able to self feed;Intermittent supervision to cue for compensatory strategies Compensations: Slow  rate;Small sips/bites;Follow solids with liquid Postural Changes and/or Swallow Maneuvers: Seated upright 90 degrees;Upright 30-60 min after meal                Oral Care Recommendations: Oral care BID Follow up Recommendations: 24 hour supervision/assistance SLP Visit Diagnosis: Dysphagia, pharyngeal phase (R13.13) Plan: All goals met       GO             [name] Rainwater MA, CCC-SLP [phone]    [name] [name] 09/12/2016, 3:11 PM

## 2016-09-12 NOTE — Telephone Encounter (Signed)
Pt is going home today from the hospital and would like to know if Dr. Clent Ridges would be the attending physician for the pt at Hospice?

## 2016-09-13 ENCOUNTER — Other Ambulatory Visit: Payer: Self-pay | Admitting: Family Medicine

## 2016-09-13 DIAGNOSIS — K838 Other specified diseases of biliary tract: Secondary | ICD-10-CM

## 2016-09-13 MED ORDER — AMOXICILLIN-POT CLAVULANATE 400-57 MG/5ML PO SUSR
875.0000 mg | Freq: Two times a day (BID) | ORAL | Status: DC
  Filled 2016-09-13: qty 10.9

## 2016-09-13 MED ORDER — AMOXICILLIN-POT CLAVULANATE 400-57 MG/5ML PO SUSR: 875.0000 mg | Freq: Two times a day (BID) | ORAL | 0 refills | Status: AC

## 2016-09-13 NOTE — Progress Notes (Signed)
Nsg Discharge Note  Admit Date:  09/03/2016 Discharge date: 09/13/2016   [name] [name] to be D/C'd [name] per MD order.  AVS completed.  Copy for chart, and copy for patient signed, and dated. Patient/caregiver able to verbalize understanding.  Discharge Medication: Allergies as of 09/13/2016      Reactions   Clindamycin Hcl Diarrhea   Rofecoxib Other (See Comments)   "didn't agree with her" per daughter   Celecoxib Other (See Comments)   "didn't agree with her" per daughter   Sertraline Hcl Other (See Comments)   "didn't agree with her" per daughter      Medication List    STOP taking these medications   ELIQUIS 2.5 MG Tabs tablet Generic drug:  apixaban   ferrous sulfate 325 (65 FE) MG tablet   magnesium oxide 400 MG tablet Commonly known as:  MAG-OX     TAKE these medications   acetaminophen 500 MG tablet Commonly known as:  TYLENOL Take 1,000 mg by mouth every 12 (twelve) hours as needed (moderate to severe pain).   amoxicillin-clavulanate 400-57 MG/5ML suspension Commonly known as:  AUGMENTIN Take 10.9 mLs (872 mg total) by mouth every 12 (twelve) hours.   carvedilol 3.125 MG tablet Commonly known as:  COREG TAKE 1 TABLET TWICE DAILY. What changed:  See the new instructions.   ENSURE Take by mouth 2 (two) times daily.   escitalopram 10 MG tablet Commonly known as:  LEXAPRO Take 1 tablet (10 mg total) by mouth daily.   furosemide 40 MG tablet Commonly known as:  LASIX Take 1 tablet (40 mg total) by mouth daily.   gabapentin 100 MG capsule Commonly known as:  NEURONTIN TAKE 1 CAPSULE EVERY DAY. What changed:  See the new instructions.   levothyroxine 75 MCG tablet Commonly known as:  SYNTHROID, LEVOTHROID Take 75 mcg by mouth daily before breakfast.   pantoprazole 40 MG tablet Commonly known as:  PROTONIX Take 1 tablet (40 mg total) by mouth daily.   potassium chloride 20 MEQ packet Commonly known as:  KLOR-CON Take 20 mEq by mouth 2  (two) times daily.   risperiDONE 0.5 MG tablet Commonly known as:  RISPERDAL Once daily at suppertime What changed:  how much to take  when to take this  additional instructions   traMADol 50 MG tablet Commonly known as:  ULTRAM Take 1-2 tablets (50-100 mg total) by mouth every 6 (six) hours as needed for moderate pain or severe pain. What changed:  how much to take  reasons to take this       Discharge Assessment: Vitals:   09/13/16 0614 09/13/16 1000  BP: 98/61 129/73  Pulse: 89 81  Resp: 17   Temp: (!) 97.5 F (36.4 C)   SpO2: 98%    Skin has MASD in perineal area and Stage I on back with foam.  IV catheter discontinued intact. Site without signs and symptoms of complications - no redness or edema noted at insertion site, patient denies c/o pain - only slight tenderness at site.  Dressing with slight pressure applied.  D/c Instructions-Education: Discharge instructions given to patient/family with verbalized understanding. D/c education completed with patient/family including follow up instructions, medication list, d/c activities limitations if indicated, with other d/c instructions as indicated by MD - patient able to verbalize understanding, all questions fully answered. Patient instructed to return to ED, call 911, or call MD for any changes in condition.  Patient escorted via WC, and D/C home via private auto.  Chrislynn Mosely [name], RN 09/13/2016 1:02 PM

## 2016-09-13 NOTE — Progress Notes (Signed)
Dressing around biliary drain changed this morning. Minimal greenish/brown drainage noted around the site of the drain. Dressing change tolerated well by patient.

## 2016-09-13 NOTE — Telephone Encounter (Signed)
I spoke with Drenda Freeze and gave the verbal.okay.

## 2016-09-13 NOTE — Progress Notes (Addendum)
10:30am: Patient able to discharge to ALF today. Equipment has been delivered and [facility] sent orders for biliary drain.  9am: CSW spoke with RN at ALF to determine why patient did not discharge. She stated that they are not able to handle biliary drains and that they would need a written note from Hospice stating that they would be the ones responsible for maintaining the drain every day. CSW inquiring with Hospice.  Osborne Casco Ysabela Keisler LCSWA [phone]

## 2016-09-13 NOTE — Progress Notes (Signed)
Patient ID: [name] [name], female   DOB: [birth year], 83 y.o.   MRN: [id number] Patient was not discharged on 09/12/2016 because logistics of discharge could not be arranged. Patient will be discharged on 09/13/2016 to assisted living facility and hospice will evaluate the patient there and manage the biliary drain as well. After discussion with the patient's daughter, Eliquis will be discontinued.

## 2016-09-13 NOTE — Progress Notes (Signed)
[facility] will DC to: [facility] Anticipated DC date: 09/13/16 Family notified: Daughter Transport by: Sharin Mons   Per MD patient ready for DC to ALF. RN, patient, patient's family, and facility notified of DC. Discharge Summary, FL2 sent to facility. RN given number for report. DC packet on chart. Ambulance transport requested for patient.   CSW signing off.  Cristobal Goldmann, [place] Clinical Social Worker [phone]

## 2016-09-14 ENCOUNTER — Telehealth: Payer: Self-pay | Admitting: Family Medicine

## 2016-09-14 NOTE — Telephone Encounter (Signed)
She is back at [facility] from the hospital and admitted to Hospice yesterday. She is doing okay. They put herr on emergency medicine and they will be managing the Biliary drain. They also ordered DNE equipment. She is also on continuous oxygen.

## 2016-09-14 NOTE — Telephone Encounter (Signed)
Noted  

## 2016-09-27 NOTE — Telephone Encounter (Signed)
Paper work was put in the mail to Hospice today 09/27/2016.

## 2016-10-10 ENCOUNTER — Inpatient Hospital Stay: Payer: Medicare Other | Admitting: Infectious Disease

## 2016-10-11 ENCOUNTER — Other Ambulatory Visit: Payer: Medicare Other

## 2016-10-13 ENCOUNTER — Encounter: Payer: Self-pay | Admitting: Family Medicine

## 2016-11-30 ENCOUNTER — Telehealth: Payer: Self-pay | Admitting: Family Medicine

## 2016-11-30 NOTE — Telephone Encounter (Signed)
° ° ° °  Meghan with Hospice cal lto ask for verbal orders to continue Hospice   [phone]  confiden voice mail so a message can be left

## 2016-11-30 NOTE — Telephone Encounter (Signed)
Please order this

## 2016-12-01 NOTE — Telephone Encounter (Signed)
I left a voice message for Meghan with verbal okay order.

## 2017-03-03 ENCOUNTER — Telehealth: Payer: Self-pay | Admitting: Family Medicine

## 2017-03-03 NOTE — Telephone Encounter (Signed)
Called and left a VM with pt's name and DOB and the verbal OK for recertify pt. Left our call back number as well.

## 2017-03-03 NOTE — Telephone Encounter (Signed)
Copied from CRM [id number]. Topic: Quick Communication - See Telephone Encounter >> Mar 03, 2017  9:46 AM Oneal GroutSebastian, Jennifer S wrote: CRM for notification. See Telephone encounter for:  Need verbal order for hospice recertification.  03/03/17.

## 2017-03-03 NOTE — Telephone Encounter (Signed)
Sent to PCP for the OK for verbal orders  

## 2017-03-03 NOTE — Telephone Encounter (Signed)
Okay to re-certify

## 2017-03-30 IMAGING — US US ABDOMEN LIMITED
1 series · 14 of 25 positions shown · non-contrast
Comparison: None.

CLINICAL DATA: Hyperbilirubinemia.

EXAM:
US ABDOMEN LIMITED - RIGHT UPPER QUADRANT

[Series 1: us abdomen limited · 0.22mm/px · 14 of 41 slices shown]
[im 1/41]
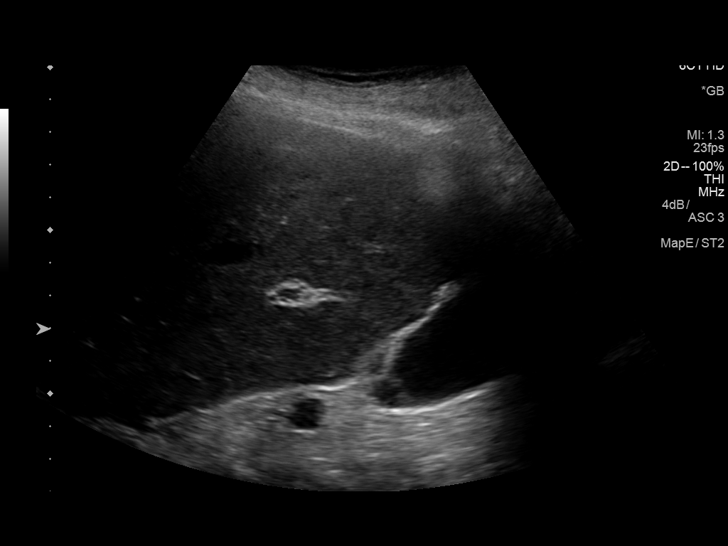
[im 4/41]
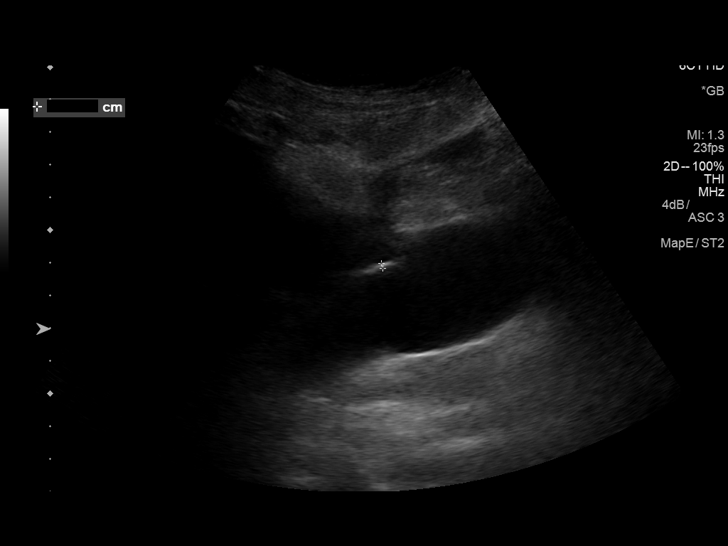
[im 7/41]
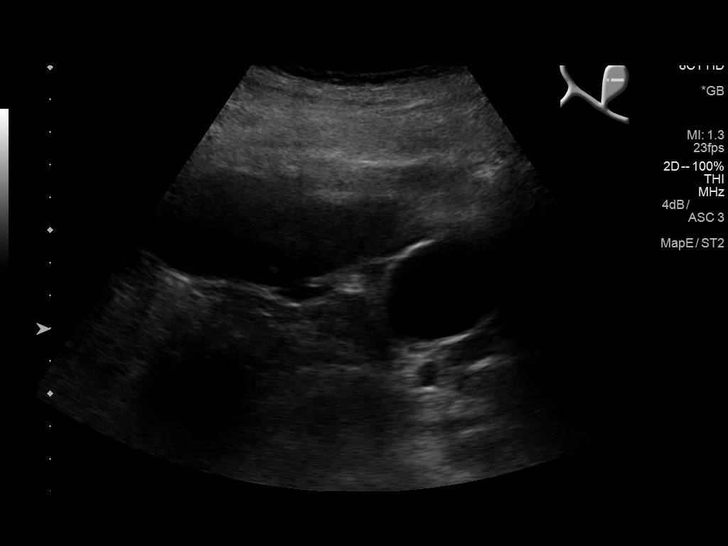
[im 11/41]
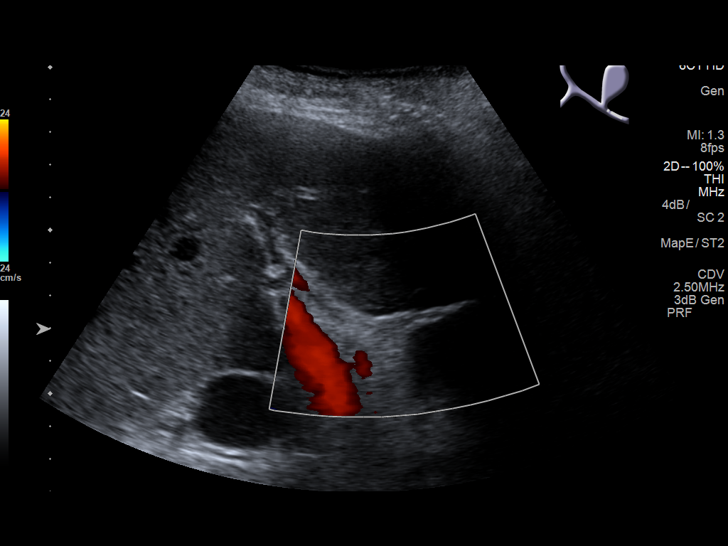
[im 14/41]
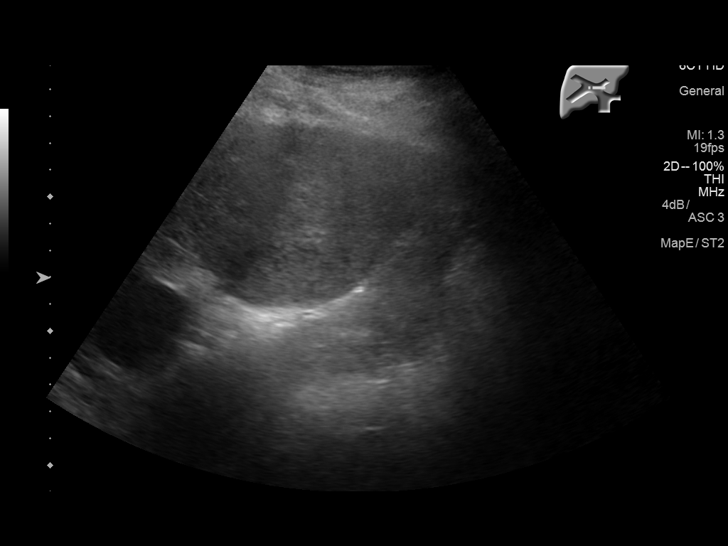
[im 16/41]
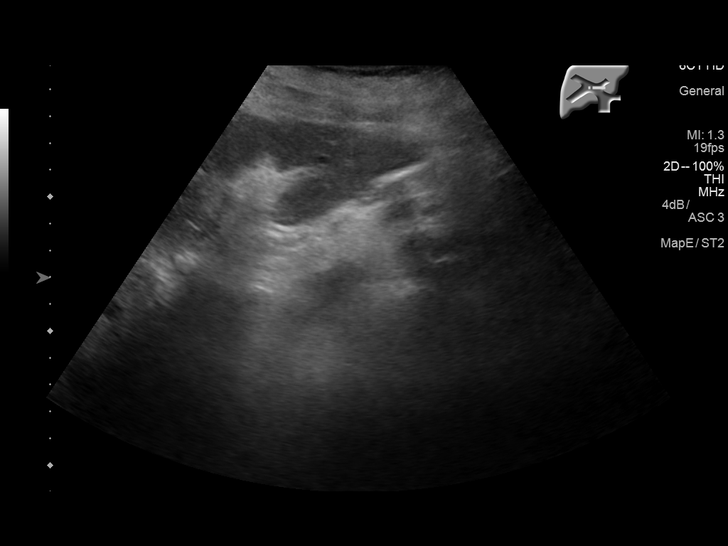
[im 19/41]
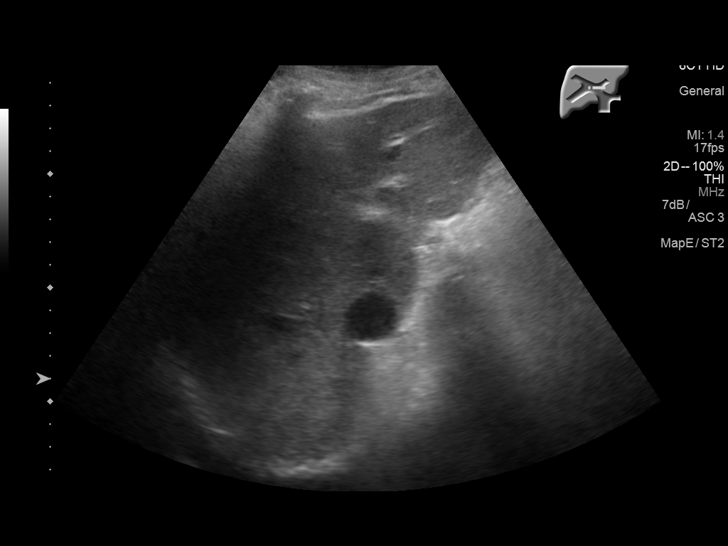
[im 22/41]
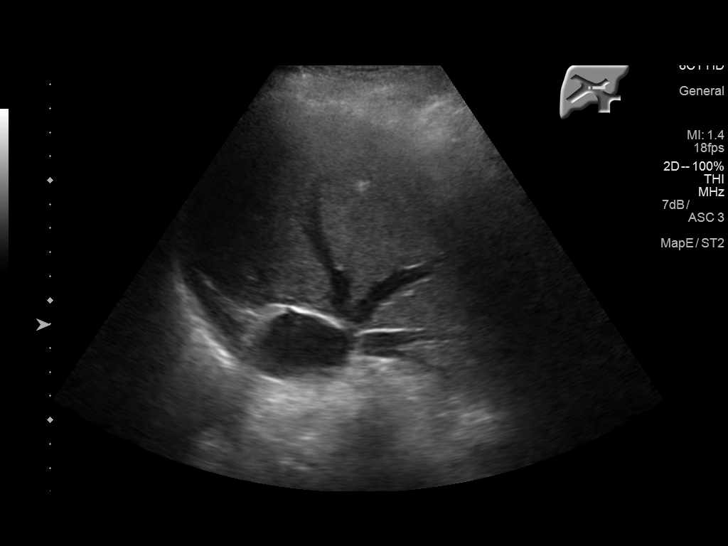
[im 26/41]
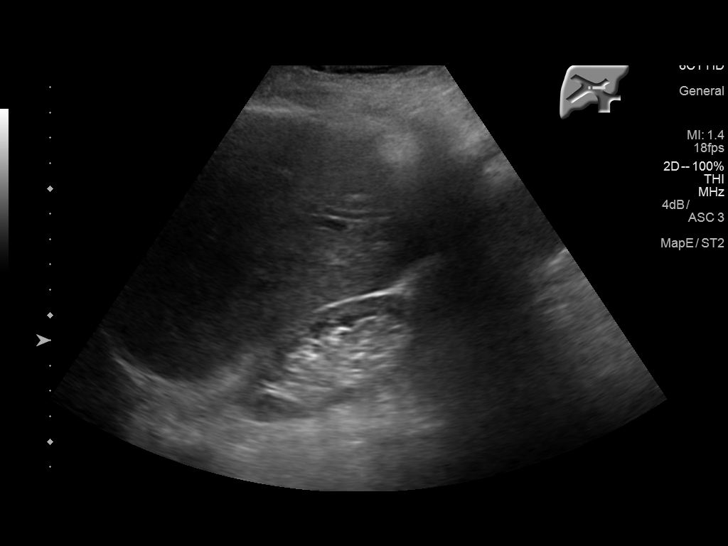
[im 27/41]
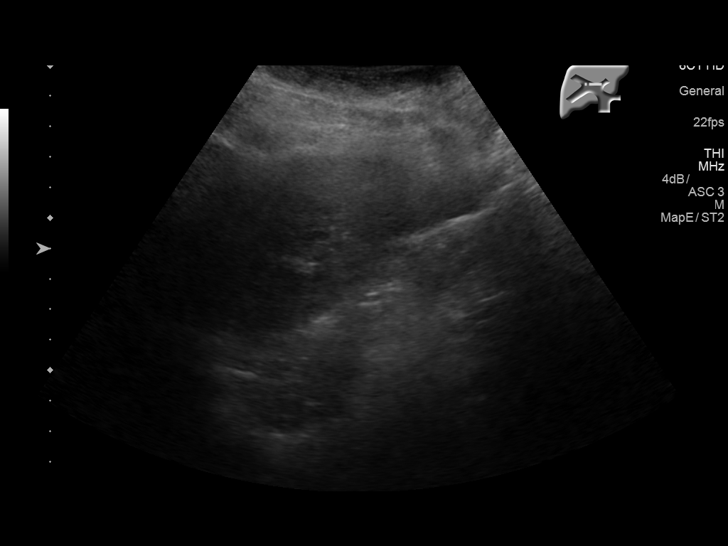
[im 31/41]
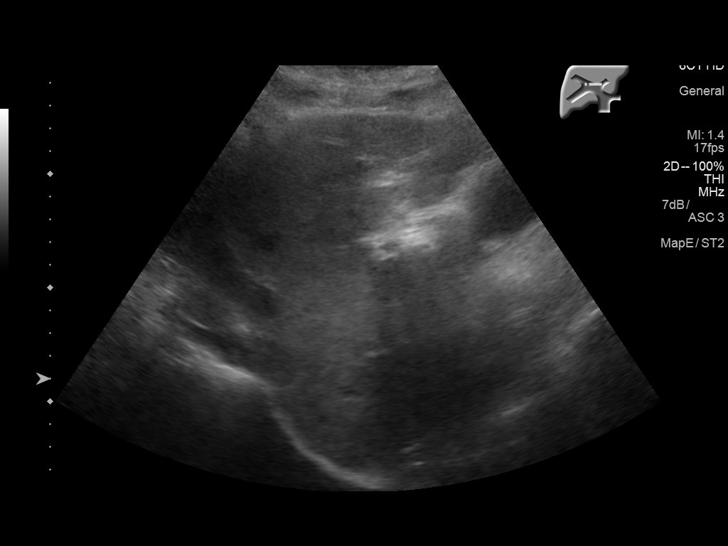
[im 34/41]
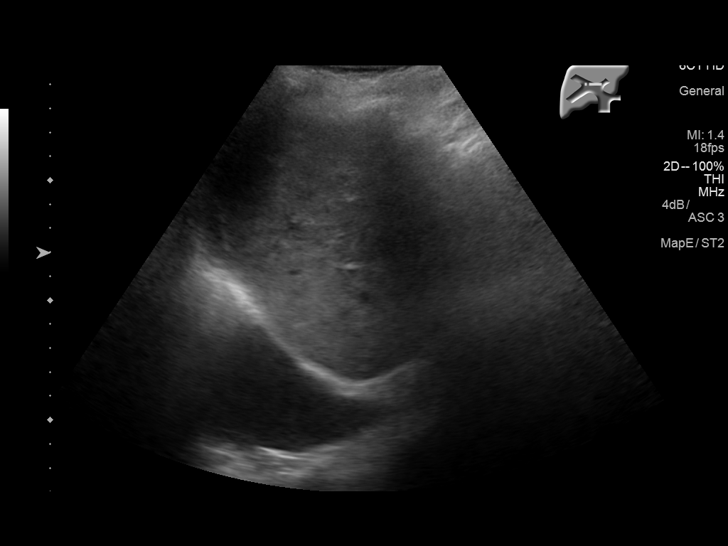
[im 37/41]
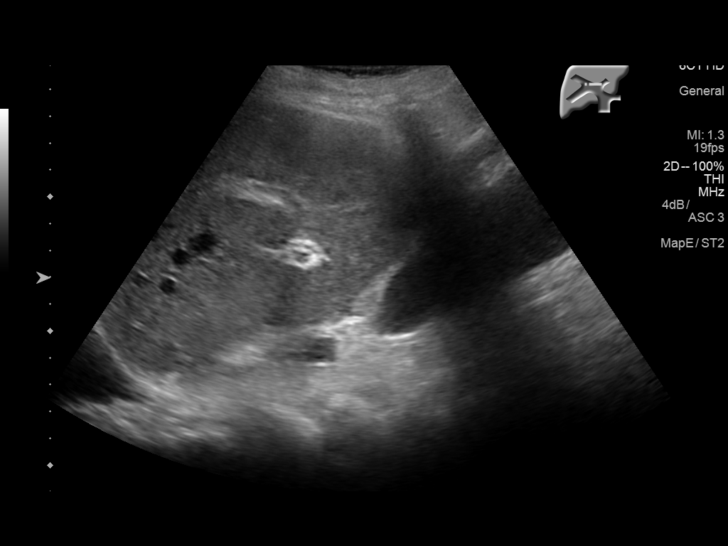
[im 41/41]
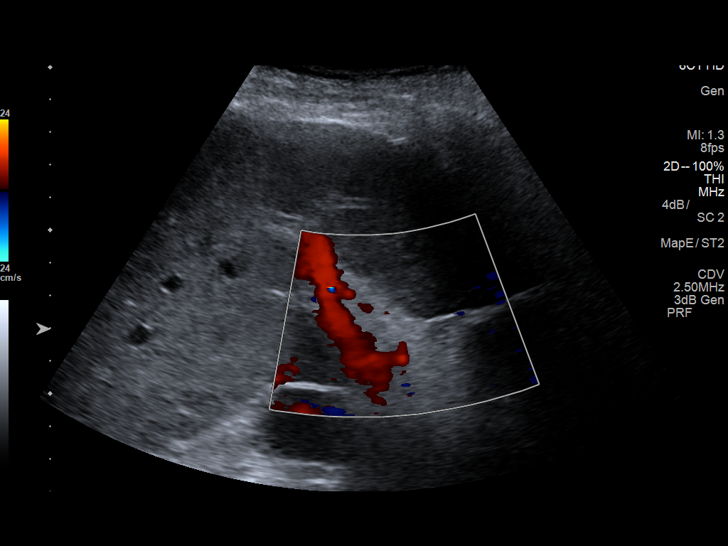

[14 of 25 positions shown; findings below may reference images not displayed]

FINDINGS: Gallbladder:

No gallstones or wall thickening visualized. No sonographic Murphy
sign noted by sonographer.

Common bile duct:

Diameter: 3.7 mm

Liver:

No focal lesion identified. Within normal limits in parenchymal
echogenicity.

Moderate right pleural effusion.
IMPRESSION: Negative for gallstones.  No biliary obstruction

Right pleural effusion

## 2017-04-24 DEATH — deceased

## 2018-01-12 IMAGING — US US ABDOMEN LIMITED
1 series · 14 of 25 positions shown · non-contrast
Comparison: 09/03/2016 CT

CLINICAL DATA: Abnormal CT.  Cholecystitis.

EXAM:
ULTRASOUND ABDOMEN LIMITED RIGHT UPPER QUADRANT

[Series 1: us abdomen limited · 0.20mm/px · 14 of 47 slices shown]
[im 1/47]
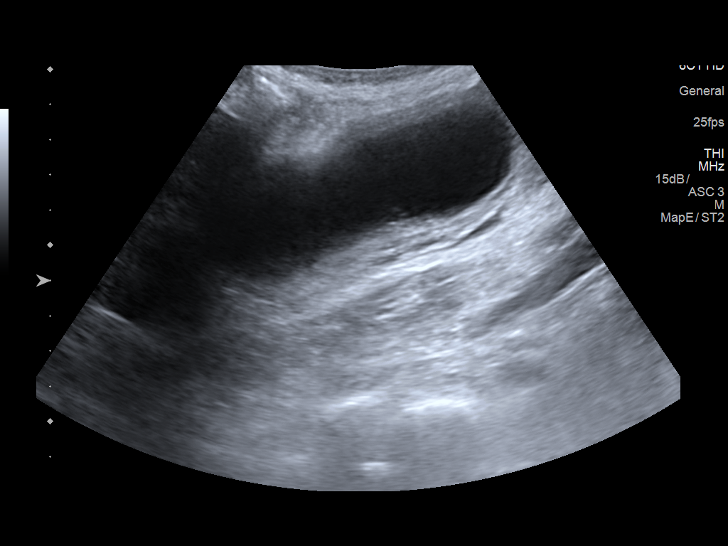
[im 4/47]
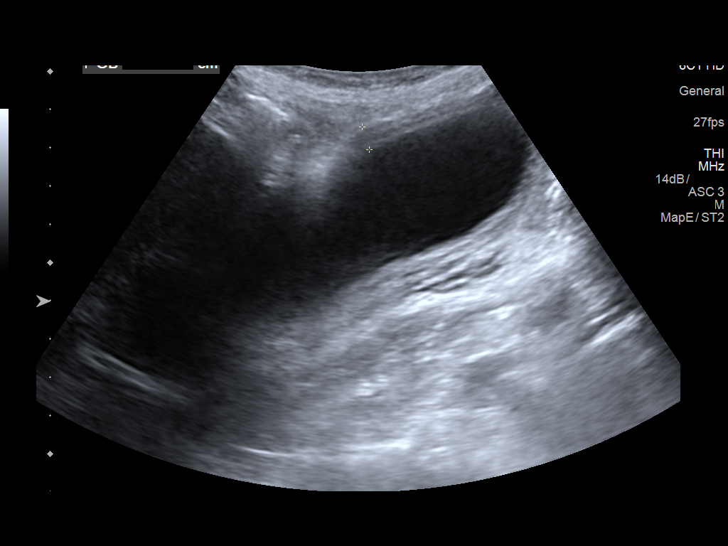
[im 8/47]
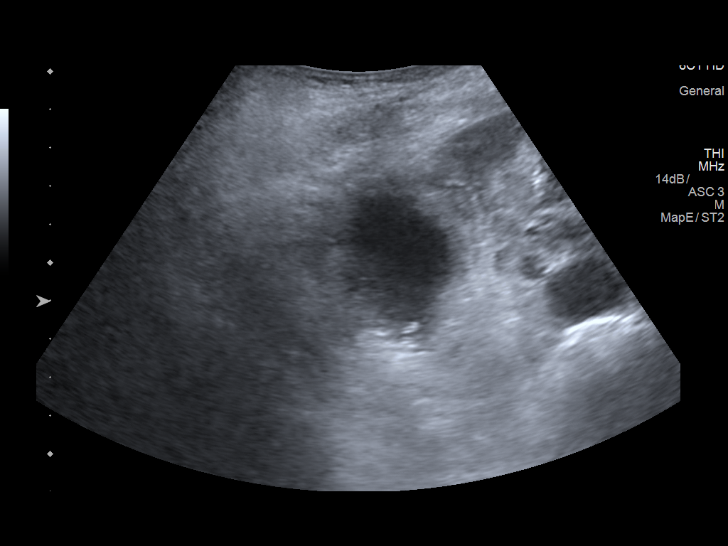
[im 12/47]
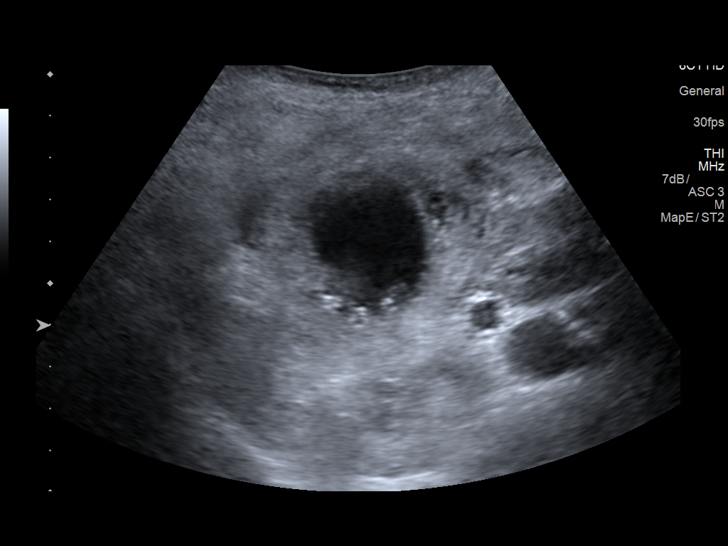
[im 16/47]
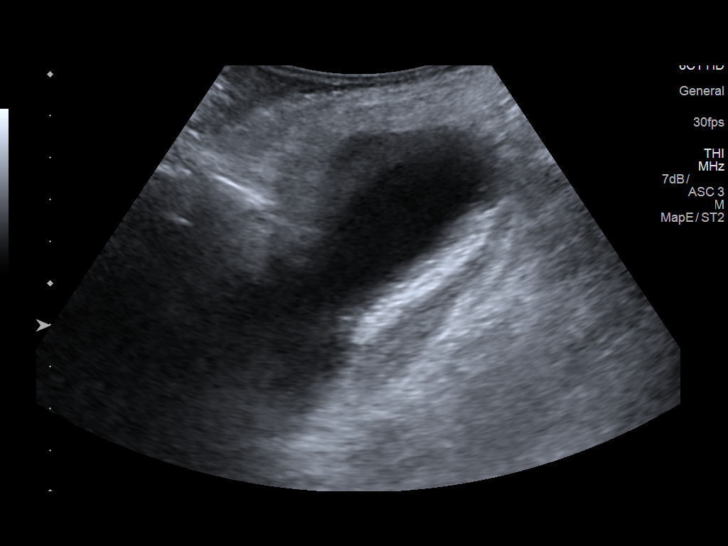
[im 18/47]
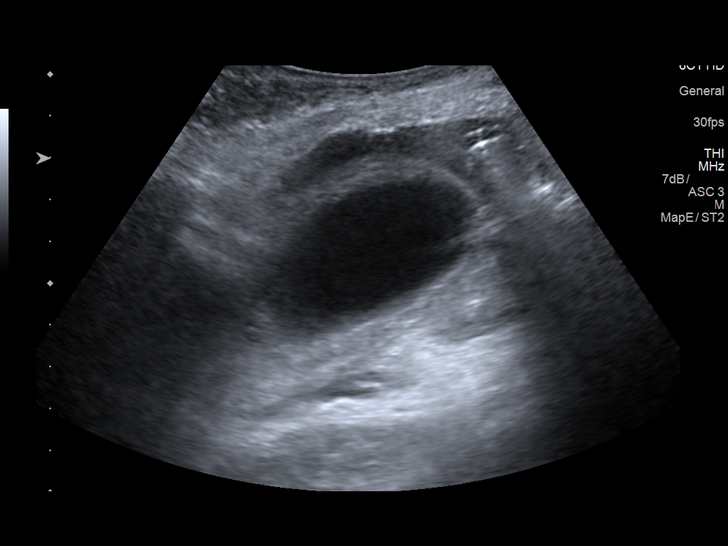
[im 22/47]
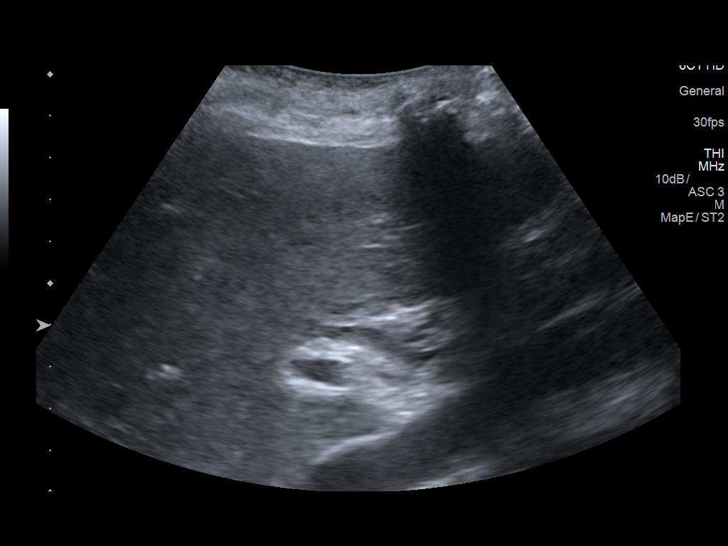
[im 25/47]
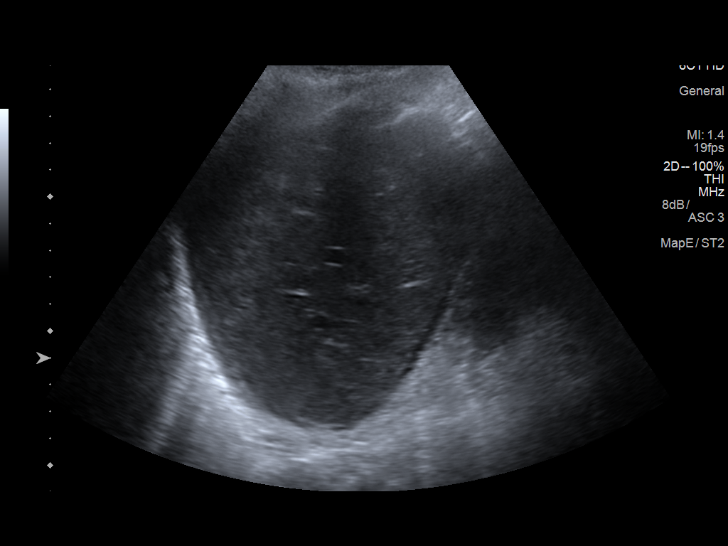
[im 29/47]
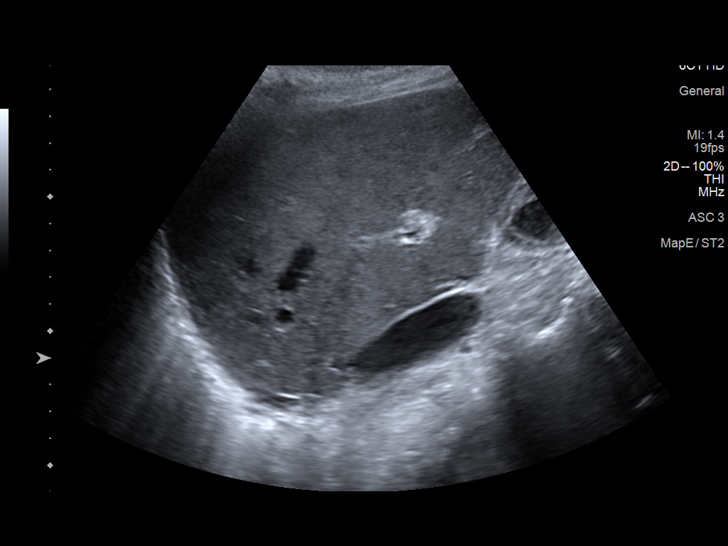
[im 31/47]
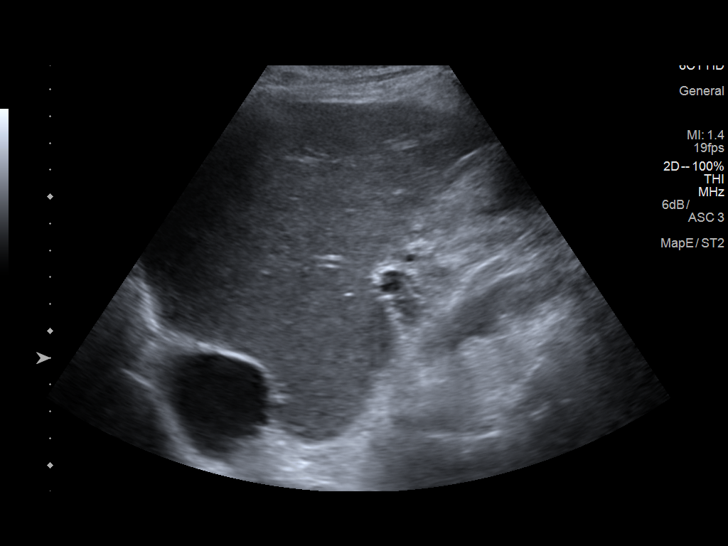
[im 35/47]
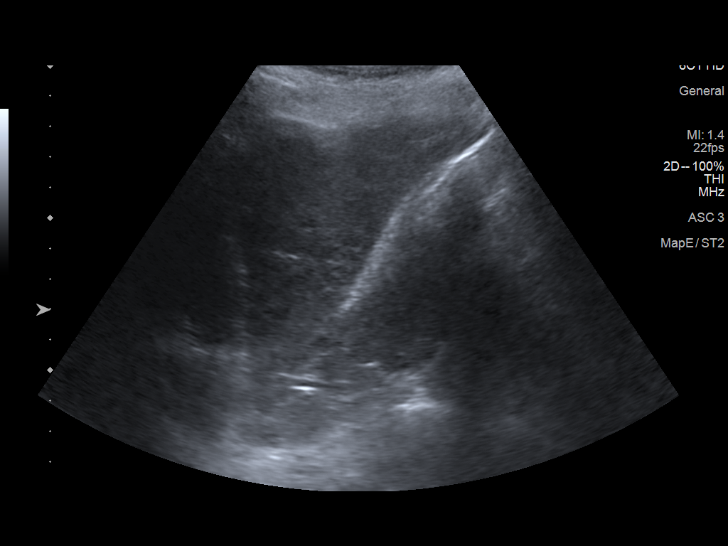
[im 39/47]
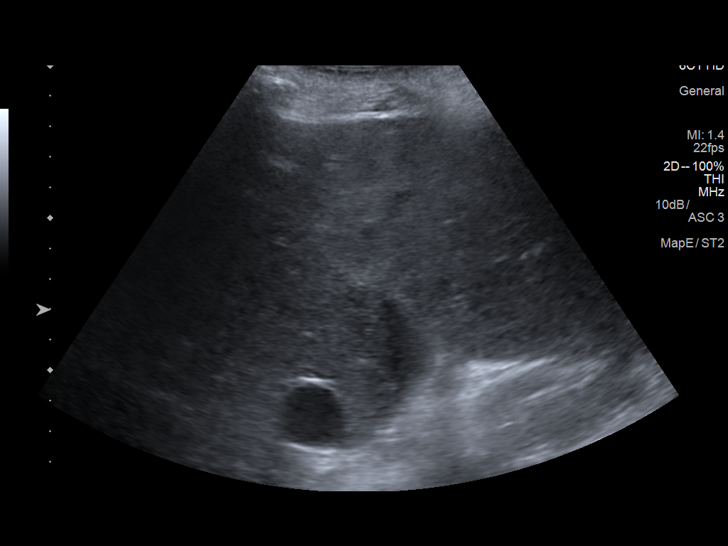
[im 43/47]
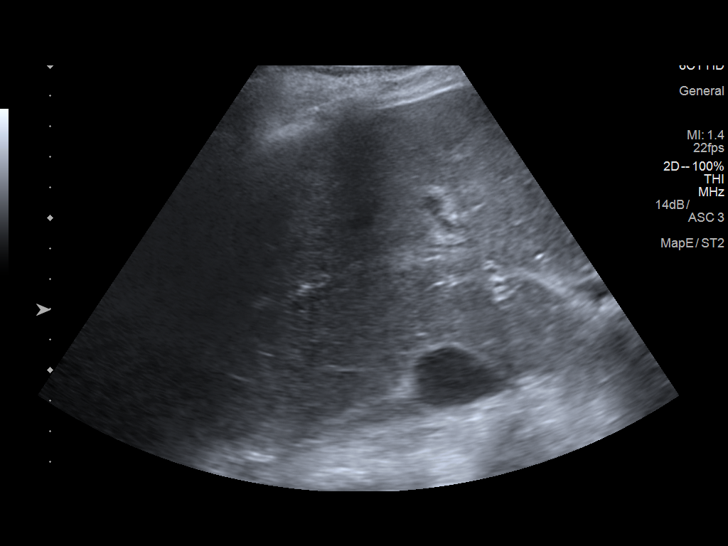
[im 47/47]
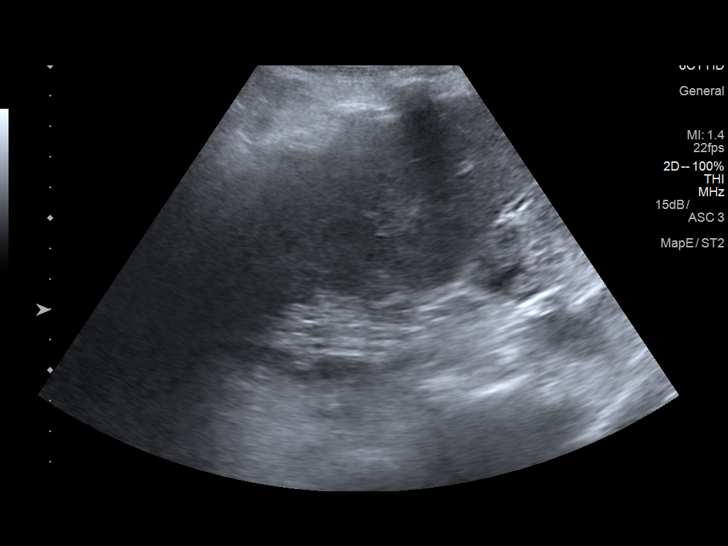

[14 of 25 positions shown; findings below may reference images not displayed]

FINDINGS: Gallbladder:

Diffuse thickening and distention of the gallbladder with the
gallbladder wall measuring up to 6 mm in thickness. There is biliary
sludge and layering calculi along the dependent wall. There is
pericholecystic fluid. The patient is ventilated and therefore
assessment for a sonographic Murphy sign could not be performed.

Common bile duct:

Diameter: 4.4 mm

Liver:

No space-occupying mass of the included liver. No intrahepatic
ductal dilatation. Small amount of free fluid is seen adjacent to
the edge of the liver. Hepatopetal main portal vein flow on color
Doppler imaging.
IMPRESSION: Gallbladder distention with mural thickening, pericholecystic fluid,
biliary sludge and calculi in keeping with changes of acute
cholecystitis.

## 2020-11-26 ENCOUNTER — Telehealth: Payer: Self-pay | Admitting: Internal Medicine

## 2020-11-26 NOTE — Telephone Encounter (Signed)
Patient former [facility] patient has not been seen since 2017. Will need to be re-established. Patient has IPF.

## 2020-11-27 NOTE — Telephone Encounter (Signed)
[name] [name]   [facility] patient. 89 years. CT 2016  has UIP. Not seen her in few years by anyone in clinic. Please bring her in for visti 30 min - no need for PFT. We can talk to her and see what goals are  Thanks  MR

## 2020-12-02 NOTE — Telephone Encounter (Signed)
Attempted to call pt's home number but line rang twice and then went to a busy tone. Tried to call back and only received a busy tone.  Attempted to call mobile number for pt which is number for daughter Moises Blood but unable to reach. Left detailed message on machine stating that we were needing to get pt in for a new consult appt with MR as she was last seen by [facility] in 2017 so pt will need to re-establish with practice.

## 2020-12-10 ENCOUNTER — Encounter: Payer: Self-pay | Admitting: *Deleted

## 2020-12-10 NOTE — Telephone Encounter (Signed)
Called pt and line rings and then busy tone  Called daughter's phone and [facility]  Will mail letter per protocol

## 2020-12-11 ENCOUNTER — Telehealth: Payer: Self-pay | Admitting: Internal Medicine

## 2020-12-11 NOTE — Telephone Encounter (Signed)
Pt's daughter called back today Dec 25, 2022 stating that pt passed away back in 05-14-17. Pt's chart was changed to show this status.

## 2020-12-11 NOTE — Telephone Encounter (Signed)
MR please be aware patient passed away in 11-May-2017.
